# Patient Record
Sex: Female | Born: 1935
Health system: Southern US, Community
[De-identification: ages and names within clinical notes are randomized; demographics above are authoritative.]

## PROBLEM LIST (undated history)

## (undated) DIAGNOSIS — D099 Carcinoma in situ, unspecified: Secondary | ICD-10-CM

## (undated) DIAGNOSIS — I493 Ventricular premature depolarization: Secondary | ICD-10-CM

## (undated) DIAGNOSIS — C4442 Squamous cell carcinoma of skin of scalp and neck: Secondary | ICD-10-CM

## (undated) DIAGNOSIS — Z96653 Presence of artificial knee joint, bilateral: Secondary | ICD-10-CM

## (undated) DIAGNOSIS — I2510 Atherosclerotic heart disease of native coronary artery without angina pectoris: Secondary | ICD-10-CM

## (undated) DIAGNOSIS — K219 Gastro-esophageal reflux disease without esophagitis: Secondary | ICD-10-CM

## (undated) DIAGNOSIS — E6609 Other obesity due to excess calories: Secondary | ICD-10-CM

## (undated) DIAGNOSIS — I10 Essential (primary) hypertension: Secondary | ICD-10-CM

## (undated) DIAGNOSIS — Z9289 Personal history of other medical treatment: Secondary | ICD-10-CM

## (undated) DIAGNOSIS — E039 Hypothyroidism, unspecified: Secondary | ICD-10-CM

## (undated) DIAGNOSIS — C4491 Basal cell carcinoma of skin, unspecified: Secondary | ICD-10-CM

## (undated) DIAGNOSIS — C4492 Squamous cell carcinoma of skin, unspecified: Secondary | ICD-10-CM

## (undated) HISTORY — DX: Squamous cell carcinoma of skin of scalp and neck: C44.42

## (undated) HISTORY — DX: Gastro-esophageal reflux disease without esophagitis: K21.9

## (undated) HISTORY — DX: Other obesity due to excess calories: E66.09

## (undated) HISTORY — PX: TONSILLECTOMY: SUR1361

## (undated) HISTORY — DX: Atherosclerotic heart disease of native coronary artery without angina pectoris: I25.10

## (undated) HISTORY — DX: Personal history of other medical treatment: Z92.89

## (undated) HISTORY — PX: REPLACEMENT TOTAL KNEE: SUR1224

## (undated) HISTORY — DX: Presence of artificial knee joint, bilateral: Z96.653

## (undated) HISTORY — DX: Ventricular premature depolarization: I49.3

## (undated) HISTORY — DX: Hypothyroidism, unspecified: E03.9

---
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
## 1982-12-28 HISTORY — PX: ABDOMINAL HYSTERECTOMY: SHX81

## 1996-09-27 HISTORY — PX: CORONARY ARTERY BYPASS GRAFT: SHX141

## 1998-04-22 ENCOUNTER — Ambulatory Visit (HOSPITAL_COMMUNITY): Admission: RE | Admit: 1998-04-22 | Discharge: 1998-04-22 | Payer: Self-pay | Admitting: Internal Medicine

## 1998-05-30 ENCOUNTER — Emergency Department (HOSPITAL_COMMUNITY): Admission: EM | Admit: 1998-05-30 | Discharge: 1998-05-30 | Payer: Self-pay | Admitting: Emergency Medicine

## 1998-06-06 ENCOUNTER — Emergency Department (HOSPITAL_COMMUNITY): Admission: EM | Admit: 1998-06-06 | Discharge: 1998-06-06 | Payer: Self-pay | Admitting: Internal Medicine

## 1998-10-24 ENCOUNTER — Other Ambulatory Visit: Admission: RE | Admit: 1998-10-24 | Discharge: 1998-10-24 | Payer: Self-pay | Admitting: Internal Medicine

## 1999-05-05 ENCOUNTER — Encounter: Payer: Self-pay | Admitting: Internal Medicine

## 1999-05-05 ENCOUNTER — Ambulatory Visit (HOSPITAL_COMMUNITY): Admission: RE | Admit: 1999-05-05 | Discharge: 1999-05-05 | Payer: Self-pay | Admitting: Internal Medicine

## 1999-07-07 ENCOUNTER — Ambulatory Visit: Admission: RE | Admit: 1999-07-07 | Discharge: 1999-07-07 | Payer: Self-pay | Admitting: Internal Medicine

## 1999-07-14 ENCOUNTER — Encounter: Payer: Self-pay | Admitting: Ophthalmology

## 1999-07-14 ENCOUNTER — Ambulatory Visit (HOSPITAL_COMMUNITY): Admission: RE | Admit: 1999-07-14 | Discharge: 1999-07-15 | Payer: Self-pay | Admitting: Ophthalmology

## 1999-11-13 ENCOUNTER — Other Ambulatory Visit: Admission: RE | Admit: 1999-11-13 | Discharge: 1999-11-13 | Payer: Self-pay | Admitting: Internal Medicine

## 1999-11-18 DIAGNOSIS — D099 Carcinoma in situ, unspecified: Secondary | ICD-10-CM

## 1999-11-18 HISTORY — DX: Carcinoma in situ, unspecified: D09.9

## 2000-06-14 ENCOUNTER — Ambulatory Visit (HOSPITAL_COMMUNITY): Admission: RE | Admit: 2000-06-14 | Discharge: 2000-06-14 | Payer: Self-pay | Admitting: Internal Medicine

## 2000-06-14 ENCOUNTER — Encounter: Payer: Self-pay | Admitting: Internal Medicine

## 2000-12-10 ENCOUNTER — Other Ambulatory Visit: Admission: RE | Admit: 2000-12-10 | Discharge: 2000-12-10 | Payer: Self-pay | Admitting: Internal Medicine

## 2001-08-18 ENCOUNTER — Emergency Department (HOSPITAL_COMMUNITY): Admission: EM | Admit: 2001-08-18 | Discharge: 2001-08-18 | Payer: Self-pay | Admitting: Emergency Medicine

## 2001-08-18 ENCOUNTER — Encounter: Payer: Self-pay | Admitting: Emergency Medicine

## 2001-10-03 ENCOUNTER — Ambulatory Visit (HOSPITAL_COMMUNITY): Admission: RE | Admit: 2001-10-03 | Discharge: 2001-10-03 | Payer: Self-pay | Admitting: Internal Medicine

## 2001-10-03 ENCOUNTER — Encounter: Payer: Self-pay | Admitting: Internal Medicine

## 2002-04-08 ENCOUNTER — Emergency Department (HOSPITAL_COMMUNITY): Admission: EM | Admit: 2002-04-08 | Discharge: 2002-04-08 | Payer: Self-pay | Admitting: Emergency Medicine

## 2002-11-06 ENCOUNTER — Encounter: Payer: Self-pay | Admitting: Ophthalmology

## 2002-11-06 ENCOUNTER — Encounter (INDEPENDENT_AMBULATORY_CARE_PROVIDER_SITE_OTHER): Payer: Self-pay | Admitting: Specialist

## 2002-11-06 ENCOUNTER — Ambulatory Visit (HOSPITAL_COMMUNITY): Admission: RE | Admit: 2002-11-06 | Discharge: 2002-11-06 | Payer: Self-pay | Admitting: Ophthalmology

## 2002-12-12 ENCOUNTER — Emergency Department (HOSPITAL_COMMUNITY): Admission: EM | Admit: 2002-12-12 | Discharge: 2002-12-12 | Payer: Self-pay | Admitting: Emergency Medicine

## 2003-02-19 ENCOUNTER — Inpatient Hospital Stay (HOSPITAL_COMMUNITY): Admission: RE | Admit: 2003-02-19 | Discharge: 2003-02-22 | Payer: Self-pay | Admitting: Orthopedic Surgery

## 2003-02-22 ENCOUNTER — Inpatient Hospital Stay (HOSPITAL_COMMUNITY)
Admission: RE | Admit: 2003-02-22 | Discharge: 2003-02-28 | Payer: Self-pay | Admitting: Physical Medicine & Rehabilitation

## 2004-01-29 DIAGNOSIS — C4492 Squamous cell carcinoma of skin, unspecified: Secondary | ICD-10-CM

## 2004-01-29 HISTORY — DX: Squamous cell carcinoma of skin, unspecified: C44.92

## 2004-02-04 ENCOUNTER — Inpatient Hospital Stay (HOSPITAL_COMMUNITY): Admission: RE | Admit: 2004-02-04 | Discharge: 2004-02-07 | Payer: Self-pay | Admitting: Orthopedic Surgery

## 2004-02-07 ENCOUNTER — Inpatient Hospital Stay (HOSPITAL_COMMUNITY)
Admission: RE | Admit: 2004-02-07 | Discharge: 2004-02-13 | Payer: Self-pay | Admitting: Physical Medicine & Rehabilitation

## 2004-02-29 ENCOUNTER — Emergency Department (HOSPITAL_COMMUNITY): Admission: EM | Admit: 2004-02-29 | Discharge: 2004-02-29 | Payer: Self-pay

## 2004-07-14 DIAGNOSIS — C4491 Basal cell carcinoma of skin, unspecified: Secondary | ICD-10-CM

## 2004-07-14 HISTORY — DX: Basal cell carcinoma of skin, unspecified: C44.91

## 2004-07-18 ENCOUNTER — Ambulatory Visit (HOSPITAL_COMMUNITY): Admission: RE | Admit: 2004-07-18 | Discharge: 2004-07-18 | Payer: Self-pay | Admitting: Internal Medicine

## 2005-03-13 DIAGNOSIS — C4492 Squamous cell carcinoma of skin, unspecified: Secondary | ICD-10-CM

## 2005-03-13 HISTORY — DX: Squamous cell carcinoma of skin, unspecified: C44.92

## 2005-05-03 ENCOUNTER — Emergency Department (HOSPITAL_COMMUNITY): Admission: EM | Admit: 2005-05-03 | Discharge: 2005-05-03 | Payer: Self-pay | Admitting: Emergency Medicine

## 2005-08-10 ENCOUNTER — Ambulatory Visit (HOSPITAL_COMMUNITY): Admission: RE | Admit: 2005-08-10 | Discharge: 2005-08-10 | Payer: Self-pay | Admitting: Internal Medicine

## 2006-10-18 ENCOUNTER — Ambulatory Visit (HOSPITAL_COMMUNITY): Admission: RE | Admit: 2006-10-18 | Discharge: 2006-10-18 | Payer: Self-pay | Admitting: Internal Medicine

## 2006-11-08 ENCOUNTER — Encounter: Admission: RE | Admit: 2006-11-08 | Discharge: 2006-11-08 | Payer: Self-pay | Admitting: Orthopedic Surgery

## 2007-12-06 ENCOUNTER — Ambulatory Visit (HOSPITAL_COMMUNITY): Admission: RE | Admit: 2007-12-06 | Discharge: 2007-12-06 | Payer: Self-pay | Admitting: Internal Medicine

## 2009-01-08 ENCOUNTER — Ambulatory Visit (HOSPITAL_COMMUNITY): Admission: RE | Admit: 2009-01-08 | Discharge: 2009-01-08 | Payer: Self-pay | Admitting: Internal Medicine

## 2009-01-09 DIAGNOSIS — C4491 Basal cell carcinoma of skin, unspecified: Secondary | ICD-10-CM

## 2009-01-09 HISTORY — DX: Basal cell carcinoma of skin, unspecified: C44.91

## 2009-04-13 ENCOUNTER — Emergency Department (HOSPITAL_COMMUNITY): Admission: EM | Admit: 2009-04-13 | Discharge: 2009-04-13 | Payer: Self-pay | Admitting: Emergency Medicine

## 2010-04-03 ENCOUNTER — Ambulatory Visit (HOSPITAL_COMMUNITY): Admission: RE | Admit: 2010-04-03 | Discharge: 2010-04-03 | Payer: Self-pay | Admitting: Internal Medicine

## 2011-01-28 ENCOUNTER — Other Ambulatory Visit: Payer: Self-pay | Admitting: Dermatology

## 2011-03-30 ENCOUNTER — Other Ambulatory Visit (HOSPITAL_COMMUNITY): Payer: Self-pay | Admitting: Internal Medicine

## 2011-03-30 DIAGNOSIS — Z1231 Encounter for screening mammogram for malignant neoplasm of breast: Secondary | ICD-10-CM

## 2011-04-08 LAB — CBC
HCT: 36.4 % (ref 36.0–46.0)
MCHC: 33.8 g/dL (ref 30.0–36.0)
MCV: 95.4 fL (ref 78.0–100.0)
RBC: 3.81 MIL/uL — ABNORMAL LOW (ref 3.87–5.11)
WBC: 7 10*3/uL (ref 4.0–10.5)

## 2011-04-08 LAB — DIFFERENTIAL
Eosinophils Absolute: 0.1 10*3/uL (ref 0.0–0.7)
Eosinophils Relative: 1 % (ref 0–5)
Lymphs Abs: 1.7 10*3/uL (ref 0.7–4.0)
Monocytes Relative: 10 % (ref 3–12)
Neutro Abs: 4.5 10*3/uL (ref 1.7–7.7)

## 2011-04-08 LAB — BASIC METABOLIC PANEL
Chloride: 106 mEq/L (ref 96–112)
GFR calc Af Amer: >60 mL/min (ref >60)
Glucose, Bld: 145 mg/dL — ABNORMAL HIGH (ref 70–99)
Potassium: 3.7 mEq/L (ref 3.5–5.1)

## 2011-04-08 LAB — CK TOTAL AND CKMB (NOT AT ARMC): Total CK: 42 U/L (ref 7–177)

## 2011-04-23 ENCOUNTER — Ambulatory Visit (HOSPITAL_COMMUNITY)
Admission: RE | Admit: 2011-04-23 | Discharge: 2011-04-23 | Disposition: A | Discharge status: Home / Self Care | Payer: Medicare Other | Source: Ambulatory Visit | Attending: Internal Medicine | Admitting: Internal Medicine

## 2011-04-23 DIAGNOSIS — Z1231 Encounter for screening mammogram for malignant neoplasm of breast: Secondary | ICD-10-CM | POA: Insufficient documentation

## 2011-05-15 NOTE — Discharge Summary (Signed)
NAME:  Kathryn Carlson, Kathryn Carlson                       ACCOUNT NO.:  [id number]   MEDICAL RECORD NO.:  [id number]                   PATIENT TYPE:  IPS   LOCATION:  4140                                 FACILITY:  [facility]   PHYSICIAN:  Ranelle Oyster, M.D.             DATE OF BIRTH:  [birth year]   DATE OF ADMISSION:  02/07/2004  DATE OF DISCHARGE:  02/13/2004                                 DISCHARGE SUMMARY   DISCHARGE DIAGNOSES:  1. Left total knee replacement.  2. Hypothyroidism.  3. Hypertension.   HISTORY OF PRESENT ILLNESS:  Kathryn Carlson is a 77 year old female, past  history of GERD, hypothyroidism, left knee osteoarthritis with end-stage  changes and pain despite conservative therapies.  She elected to undergo  left total knee replacement February 04, 2004, by Dr. Lequita Halt.  Postoperatively, is weightbearing as tolerated, on Coumadin for DVT  prophylaxis.  INR subtherapeutic at admission.  Physical therapy initiated  and patient currently minimal assist for transfers, minimal assist for  ambulating 20 feet with a rolling walker.   PAST MEDICAL HISTORY:  1. Hypertension.  2. Coronary artery disease, status post CABG.  3. Skin cancers.  4. Hysterectomy.  5. Right total knee replacement.  6. Appendectomy.  7. Retinal surgery x2 with lens reattachment.  8. Incontinence occasionally with nocturia.  9. Ocular migraines.  10.      ____________.  11.      Dyslipidemia.  12.      History of liver inflammation in the past.   ALLERGIES:  PROCAINE.   SOCIAL HISTORY:  The patient lives alone in one level home with five steps  at entry.  Was independent prior to admission.  Uses alcohol occasionally.  Does not use any tobacco.  Has a sister who can assist past discharge.   HOSPITAL COURSE:  Kathryn Carlson was admitted to rehab on February 02, 2004,  for inpatient therapies to consist of PT and OT daily.  Past admission, she  was maintained on Coumadin for DVT prophylaxis, subcu Lovenox had  been  initially planned on a therapeutic basis.  Blood pressures were monitored on  b.i.d. basis and have shown good control.   Labs done past admission showed hemoglobin 11.1, hematocrit 33.2, white  count 11.7, platelets 319.  Lytes showed sodium 138, potassium 4.0, chloride  103, CO2 28, BUN 12, creatinine 1.0, glucose 135.  UA/UCS was done and shows  no growth.  The patient's knee incision is noted to be healing well without  any signs or symptoms of infection, no drainage, no erythema noted.  During  her stay in rehab, Kathryn Carlson made good progress.  She is currently at  modified independent level for transfers, modified independent level for  ambulating 150 feet with rolling walker, required supervision for navigating  two stairs.  Knee flexion is approximately 85 degrees.  The patient is  modified independent for ADLs including toileting.  She is modified  independent for simple meal preparation tasks.  A home health CPM was  ordered to help further range of motion of the knee.  She will also continue  to have home health PT and OT by [facility] with home  health R.N. arranged to draw pro times next on February 15, 2004.  On  February 13, 2004, the patient is discharged to home in improved condition.   DISCHARGE MEDICATIONS:  1. Coumadin 4 mg p.o. per day.  2. Senokot S two p.o. q.h.s.  3. Lopressor 25 mg b.i.d.  4. Synthroid 50 mcg per day.  5. Lipitor 40 mg per day.  6. Aciphex 20 mg a day.  7. Oxycodone INR 5 to 10 mg p.o. q.4-6h. p.r.n. pain.  8. Robaxin 500 mg p.o. q.i.d. p.r.n. pain.   ACTIVITY:  Use walker.   DIET:  Regular.   WOUND CARE:  Keep area clean and dry.   SPECIAL INSTRUCTIONS:  No alcohol, no smoking, no driving.  [facility] with provide PT, OT and RN with next pro time on February 15, 2004.   FOLLOWUP:  The patient to follow up with Dr. Lequita Halt in the next week.  Follow-up with Dr. Jacky Kindle for routine check.  Follow up  with Dr. Riley Kill as  needed.      Greg Cutter, P.A.                    Ranelle Oyster, M.D.    PP/MEDQ  D:  02/13/2004  T:  02/14/2004  Job:  [id number]   cc:   Geoffry Paradise, M.D.  [address]  [place]  [place] [address]  Fax: [phone]   Ollen Gross, M.D.  [facility]  [address]  [address]  [place]  [place] [address]  Fax: [phone]

## 2011-05-15 NOTE — Op Note (Signed)
NAME:  Kathryn Carlson, Kathryn Carlson                       ACCOUNT NO.:  [id number]   MEDICAL RECORD NO.:  [id number]                   PATIENT TYPE:  INP   LOCATION:  J191                                 FACILITY:  [facility]   PHYSICIAN:  Ollen Gross, M.D.                 DATE OF BIRTH:  Apr 20, 1927   DATE OF PROCEDURE:  02/04/2004  DATE OF DISCHARGE:                                 OPERATIVE REPORT   PREOPERATIVE DIAGNOSIS:  Osteoarthritis of the left knee.   POSTOPERATIVE DIAGNOSES:  Osteoarthritis of the left knee.   OPERATION/PROCEDURE:  Left total knee arthroplasty.   SURGEON:  Ollen Gross, M.D.   ASSISTANT:  Clarene Reamer, P.A.-C.   ANESTHESIA:  Spinal.   ESTIMATED BLOOD LOSS:  Minimal.   DRAINS:  Hemovac x1.   COMPLICATIONS:  None.   TOURNIQUET TIME:  40 minutes at 300 mmHg.   DISPOSITION:  Stable to recovery.   BRIEF CLINICAL NOTE:  Kathryn Carlson is a 77 year old female with end-stage  osteoarthritis of the left knee with pain refractory to nonoperative  management including injections.  She had a previous very successful right  total knee arthroplasty and presents now for left total knee arthroplasty.   DESCRIPTION OF PROCEDURE:  After the successful administration of spinal  anesthesia, tourniquet was placed on the left thigh and left lower extremity  prepped and draped in the usual sterile fashion. The extremity was wrapped  in Esmarch, knee flexed, tourniquet inflated to 300 mmHg.   Midline incision was made with a 10-blade through the subcutaneous tissue to  the level of the extensor mechanism.  A fresh blade was used to make a  medial parapatellar arthrotomy and the soft tissue of proximal medial tibia  subperiosteally elevated to the joint line with the knife into the  semimembranosus bursa with a Cobb elevator.  Soft tissue over the proximal  lateral tibia was also elevated with attention being paid to avoid the  patellar tendon on the tibial tubercle.  Patella  was then everted and knee  flexed 90 degrees and the ACL and PCL removed.  Drill was used to create a  starting hole at the distal femur.  The canal was irrigated.  A five-degree  left valgus alignment guide was placed.  Referencing with the posterior  condyles, locations were marked and the block pins were moved 10 mm off the  distal femur.  Distal femoral resection was made with an oscillating saw.  Sizing block was then placed and size 3  was the most appropriate.  Rotation  was marked off the epicondylar axis.  The size 3 cutting block was placed  and the anterior and posterior cuts made.   Tibia was subluxed forward and the menisci removed.  Extra medullary tibial  alignment guide placed surfacing proximally to the medial aspect of the  tibial tubercle and distally along the second metatarsal axis and tibial  crest.  Block  and its pins are removed 10 mm over the nondeficient lateral  side.  Tibial resection was made with an oscillating saw.  A size 3 was most  appropriate tibia component and the  proximal tibia was prepared with a  modular drill and keel punch.  Femoral preparation was then completed with  the intercondylar and chamfer cuts.   Size 3 mobile bearing tibial trial with the size 3 posterior stabilized  femoral trial and a 10 mm posterior stabilized rotating platform insert  trial are placed.  Full extension was achieved with excellent varus and  valgus balance throughout the full range of motion.  The patella was then  everted.  Thickness was measured to be 24 mm free-hand resection was taken  to 15 mm, a 38 10-blade was placed.  Lug holes were drilled, trial patella  was placed and it tracks normally.  Osteophytes were removed off the  posterior femur with the trials in place.  All trials were then removed and  the cut bone surfaces were then prepared with pulsatile lavage.  Cement was  mixed and once ready for implantation, the size 3 mobile bearing tibial  tray, size  3 posterior stabilized femur and 38 patella are cemented into  place and patella held with a clamp.  The 10 mm trial insert was placed and  the knee held in full extension and all extruded cement removed.  Once the  cement had fully hardened, then a permanent size 10 mm posterior stabilized  rotating platform insert was placed into the tibial tray.  The wounds were  then copiously irrigated with antibiotic solution and the extensor mechanism  closed over a Hemovac drain with interrupted #1 PDS.  The tourniquet  released after a total time of 40 minutes.  Flexion against gravity is 140  degrees.  Subcutaneous tissues were then closed with interrupted 2-0 Vicryl,  subcuticular running 4-0 Monocryl.  Incision was cleaned and dried and Steri-  Strips and a bulky sterile dressing applied.  She was subsequently awakened  and transported to recovery in stable condition.                                               Ollen Gross, M.D.    FA/MEDQ  D:  02/04/2004  T:  02/04/2004  Job:  [id number]

## 2011-05-15 NOTE — Discharge Summary (Signed)
NAME:  Kathryn Carlson, Kathryn Carlson                       ACCOUNT NO.:  [id number]   MEDICAL RECORD NO.:  [id number]                   PATIENT TYPE:  IPS   LOCATION:  4142                                 FACILITY:  [facility]   PHYSICIAN:  Ranelle Oyster, M.D.             DATE OF BIRTH:  [birth year]   DATE OF ADMISSION:  02/22/2003  DATE OF DISCHARGE:  02/28/2003                                 DISCHARGE SUMMARY   DISCHARGE DIAGNOSES:  1. Right total knee arthroplasty secondary to osteoarthritis.  2. History of thyroid disease.  3. History of elevated cholesterol.  4. Anemia.   HISTORY OF PRESENT ILLNESS:  The patient is a 77 year old white female  admitted on February 19, 2003 with history of bilateral knee pain, right  greater than left and _________ therapy.  The patient underwent a right  total knee arthroplasty secondary to DJD on February 19, 2003 by Dr. Antony Odea.  The patient is on Coumadin for DVT prophylaxis.  PT report at this time  indicates that patient is ambulating minimal assist 10 feet with a rolling  walker, transfer with mild assist.   HOSPITAL COURSE:  Admitted for anemia.  The patient was transferred to [facility] patient rehab department on February 22, 2003.   PAST MEDICAL HISTORY:  Significant for hypertension, CVD, OS, cardiovascular  disease, thyroid disease and hyperlipidemia.   PAST SURGICAL HISTORY:  Significant for appendectomy, hysterectomy, CABG.   PRIMARY CARE PHYSICIAN:  Dr. Jacky Kindle.   CARDIOLOGIST:  [name].   SOCIAL HISTORY:  The patient is a widow, lives in a one level home.  She is  retired, denies any tobacco or alcohol use.   ALLERGIES:  NOVOCAIN.   FAMILY HISTORY:  Noncontributory.   REVIEW OF SYSTEMS:  Significant for joint pain, chest pain, no shortness of  breath.   HOSPITAL COURSE:  Kathryn Carlson was admitted to [facility] rehab department  on February 22, 2003 for comprehensive patient rehabilitation where she  received more than  three hours of therapy daily.  Overall Kathryn Carlson made  great progress while in rehab.  She was discharged in modified independent  level, ambulating approximately 100 feet with rolling walker.  Hospital  course while in rehab significant for anemia and mild hypokalemia and mild  dizziness.  The patient remained on __________ p.o. b.i.d. ___________.  In  rehab she had admission hemoglobin of 9.1.  Latest hemoglobin was 9.8.  She  remained on Coumadin for DVT prophylaxis, without any DVT complications.  It  was noted on rehab day number two the patient experienced some dizziness  once elevated.  She did have possible orthostasis but this has resolved  within the next day.  The patient did have a mild hypokalemia of 3.4  No  supplements were added at this time.  The patient was not on any diuretics.  Pain was being controlled with oxycodone.  The patient remained on Lopressor  for hypertension.  Blood pressure remained in good control while in rehab.  No adjustments in medication were necessary.   Latest laboratory indicated a hemoglobin of 9.8, hematocrit 28.6.  Latest  INR was 2.7.  White blood cells 8.2, platelet count 239, sodium 138,  chloride 106, CO2 27, glucose 120, BUN 12, creatinine 0.9, potassium 3.4.  She had a urine culture performed on February 22, 2003.  It did show 30,000  colonies, multi species present without uropathogen isolated.  Incision was  healed very well.  Dr. Antony Odea followed patient throughout her stay in rehab.  The patient had approximately seven degrees flexion in her right knee.  Recommend PT at the time of discharge.  PT report indicated that patient was  ambulating modified independently greater than 100 feet with rolling walker.  Transfer sit to stand modified independently and bed mobility modified  independently.  Patient can perform all ADL's modified independently.  She  was discharged home with her family.   DISCHARGE MEDICATIONS:  Include:  1.  Lopressor 50 mg one half tablet twice daily.  2. Synthroid 50 mcg daily.  3. Lipitor 40 mg daily.  4. Elavil 12.5 mg daily.  5. Trinsicon one tablet twice daily.  6. Coumadin 4 mg taken until March 20, 2003.  7. ___________ vitamin C, vitamin E, oxycodone 5 mg every four to six hours     as needed for pain.  8. Pain management oxycodone, Tylenol.   ACTIVITY:  No driving, no drinking alcohol, no aspirin, ibuprofen or Aleve  while on Coumadin.  Patient is to use CP imagery __________ rolling walker.  She will have [place] home health care for PT and OT and RN.  First draw  will be Friday, March 02, 2003.    FOLLOW UP:  She is to follow up with Dr. Antony Odea within one week, call for  appointment.  Follow up with Dr. Jacky Kindle within six weeks for him to monitor  anemia.  Follow with Dr. Faith Rogue as needed.      Junie Bame, P.A.                       Ranelle Oyster, M.D.    LH/MEDQ  D:  02/28/2003  T:  02/28/2003  Job:  [id number]   cc:   Vanita Ingles, M.D.   Geoffry Paradise, M.D.  [address]  [place]  [place] [address]  Fax: [phone]   Richard A. Alanda Amass, M.D.  [phone] [address]  [place]  [place] [address]  Fax: [phone]

## 2011-05-15 NOTE — H&P (Signed)
Kathryn Carlson, Kathryn Carlson                         ACCOUNT NO.:  [id number]   MEDICAL RECORD NO.:  [id number]                   PATIENT TYPE:  LINP   LOCATION:                                       FACILITY:  [facility]   PHYSICIAN:  Ollen Gross, M.D.                 DATE OF BIRTH:  Jun 08, 1927   DATE OF ADMISSION:  02/04/2004  DATE OF DISCHARGE:                                HISTORY & PHYSICAL   CHIEF COMPLAINT:  Left knee pain.   HISTORY OF PRESENT ILLNESS:  The patient is a 77 year old female who is well  known to Dr. Ollen Gross, having previously undergone a right total knee  arthroplasty, and has done quite well.  She unfortunately has had continued  left knee pain.  She has known end-stage arthritis in the left knee.  She  has been treated conservatively in the past, and has been refractory to non-  operative management.  She has also undergone injections, which have only  provided temporary relief.  She has reached a point where she would like to  have something done about it.  It was felt she would benefit from undergoing  total knee replacement.  Risks and benefits discussed.  The patient was  subsequently admitted to the hospital.  She has been seen by Dr. Geoffry Paradise preoperative, and it is felt that she is stable to undergo surgery.   ALLERGIES:  NOVOCAIN.   CURRENT MEDICATIONS:  1. Lopressor.  2. Lipitor.  3. Aciphex.  4. Synthroid.  5. Aspirin stopped prior to surgery.  6. Maxzide as needed.  7. Estradiol.   PAST MEDICAL HISTORY:  1. Ocular migraines.  2. Reflux disease.  3. Mild urinary incontinence.  4. Hypertension.  5. Coronary arterial disease.  6. History of myocardial infarction in 1968.  7. Hiatal hernia.  8. Hemorrhoids.  9. Thyroid disease.  10.      Osteoporosis.   PAST SURGICAL HISTORY:  1. Appendectomy.  2. Hysterectomy.  3. Bypass surgery.  4. Arthroscopic knee surgery.  5. Right total knee replacement.   SOCIAL HISTORY:  Widowed.   Retired.  Nonsmoker.  Occasional glass of wine.  She has 4 children.  Her sister will be assisting with her care after  surgery.   FAMILY HISTORY:  Father deceased at age 57 with heart disease.  Sister, age  52, with history of diabetes.   REVIEW OF SYSTEMS:  GENERAL:  No fevers, chills, night sweats.  NEUROLOGIC:  She does get ocular migraines.  She had an episode of double vision back in  July.  She did have a brain MRI which showed no abnormalities, no signs of  stroke.  RESPIRATORY:  No shortness of breath, productive cough, or  hemoptysis.  CARDIOVASCULAR:  Significant for a history of an myocardial  infarction in the past, coronary arterial disease, and hypertension.  She  denies any chest pain, angina,  orthopnea, or palpitations.  GI:  History of  constipation.  No nausea or vomiting.  No recent blood or mucous in the  stool.  GU:  Some slight mild urinary incontinence.  No dysuria, hematuria,  or discharge.  MUSCULOSKELETAL:  Pertinent to the knee found in the history  of present illness.   PHYSICAL EXAMINATION:  VITAL SIGNS:  Pulse 68, respirations 14, blood  pressure 162/84.  GENERAL:  A 77 year old white female, well-nourished, well-developed, in no  acute distress.  Alert, oriented, and cooperative.  Pleasant at the time of  exam.  HEENT:  Normocephalic and atraumatic.  Pupils round and reactive.  Extraocular movements intact.  NECK:  Supple.  A faint trace left-sided bruit versus a referred murmur from  the chest.  CHEST:  Clear to auscultation.  No rhonchi or rales.  HEART:  Regular rate and rhythm with a 2/6 to 3/6 systolic ejection murmur.  ABDOMEN:  Soft, slightly round, nontender.  Bowel sounds are present.  RECTAL/BREAST/GENITALIA:  Not done.  Not pertinent to the present illness.  EXTREMITIES:  Significant to the left knee.  She has marked crepitus on  passive range of motion.  Range of motion is 5-115 degrees.  Motor function  is intact.  Sensation is intact.   No swelling.   IMPRESSION:  1. Osteoarthritis, left knee.  2. Ocular migraines.  3. Hypertension.  4. Coronary arterial disease.  5. History of myocardial infarction.  6. Hiatal hernia.  7. Reflux disease.  8. Hemorrhoids.  9. Mild urinary incontinence.  10.      Thyroid disease.  11.      Macular degeneration.  12.      Osteoporosis.  13.      History of elevated triglycerides.   PLAN:  The patient will be admitted to [facility] to undergo a  left total knee replacement arthroplasty.  Surgery will be performed by Dr.  Ollen Gross.  Her medical doctor is Dr. Jacky Kindle.  Dr. Jacky Kindle will be  notified of the room number on admission, and will be consulted if needed  for any medical assistance with the patient throughout the hospital course.     Alexzandrew L. Julien Girt, P.A.              Ollen Gross, M.D.    ALP/MEDQ  D:  02/20/2004  T:  02/20/2004  Job:  [id number]   cc:   Geoffry Paradise, M.D.  [address]  [place]  [place] [address]  Fax: [phone]

## 2011-05-15 NOTE — Op Note (Signed)
NAME:  SENAIDA, CHILCOTE                       ACCOUNT NO.:  [id number]   MEDICAL RECORD NO.:  [id number]                   PATIENT TYPE:  OIB   LOCATION:  2899                                 FACILITY:  [facility]   PHYSICIAN:  Alford Highland. Rankin, M.D.                DATE OF BIRTH:  1927/03/06   DATE OF PROCEDURE:  11/06/2002  DATE OF DISCHARGE:                                 OPERATIVE REPORT   PREOPERATIVE DIAGNOSIS:  Cloudy intraocular lens, right eye, silicone with  manufacturer's defect, by [employer].   POSTOPERATIVE DIAGNOSIS:  Cloudy intraocular lens, right eye, silicone with  manufacturer's defect, by [employer].   OPERATION PERFORMED:  1. Intraocular lens exchange, right eye.  2. Anterior vitrectomy, right eye.   SPECIMENS:  Cloudy intraocular lens for gross evaluation and examination.  Model of the lens style is a Storz, EZE-60, power +19.0, length 12.75,  serial number [id number].   SURGEON:  Alford Highland. Rankin, M.D.   ANESTHESIA:  Retrobulbar anesthesia control.   INDICATIONS FOR PROCEDURE:  The patient is a 77 year old woman who has had  profound vision loss in the right eye after initially successful intraocular  lens placement, cataract extraction some years before, who was found to have  cloudy deposits within the substance of the intraocular lens effectively  reclouding her visual acuity as well as hampering her night vision.  The  patient understands this is an attempt to remove the manufacturer's defect  intraocular lens in the right eye and to perform intraocular lens.  She  understands that it is unlikely we will be able to place a lens into the bag  unless a larger one piece all-PMMA lens may in fact be required.  The  patient understands the risks of anesthesia including the rare occurrence of  death but also to the eye including hemorrhage, infection, scarring, need  for surgery, no change in vision, loss of vision and progression of disease  despite intervention.  After  appropriate signed consent was obtained, she  was taken to the operating room.   DESCRIPTION OF PROCEDURE:  In the operating room appropriate monitoring was  followed by mild sedation and 0.75% Marcaine with  35 cc retrobulbar followed by an additional 5 cc ________ fashion of  modified Darel Hong.  Right periocular region was sterilely prepped and draped  in the usual ophthalmic fashion.  Lid speculum applied.  Conjunctival  peritomy was fashioned in limited fashion superiorly.  A grooved limbal  incision was fashioned with a crescent blade.  This was followed by entry  into the anterior chamber with an MVR blade and allowed deepening with  Provisc.  A separate paracentesis incision was then made at the 9 o'clock  position through which attempt was made to use a Sinskey hook to rotate the  intraocular lens.  A tight phimotic anterior capsule rim did not allow this.  Small capsule openings in the radial fashion  were then made with intraocular  scissors to allow for enhanced mobility and mobilization of the intraocular  lens in the bag.  Notable findings were the posterior capsule was open.  At  this time the intraocular lens was then rotated successfully with a Sinskey  hook into the iris plane.  Thereafter the anterior chamber limbal wound was  opened with MVR blade and intraocular lens grasped with DORC forceps and  exteriorized and sent for pathologic examination.  The anterior chamber in  the field had been deepened and protected with Viscoat prior to its removal.  At this time a Storz lens was then placed into the sulcus and then rotated  to the horizontal position.  10-0 nylon was then used and knots buried to  close the 60 mm limbal wound superiorly.  At this time a Lewicky anterior  chamber maintainer was fixed through the cornea with constant infusion and  the anterior vitrectomy was then carried out through the limbal wound  through the visual axis to assure all removal of debris.   The remainder of  the Viscoat was aspirated.  Limbal wound was then checked and found to be  secure.  At this time the wound was checked and found to be secure.  Conjunctiva closed with 7-0 Vicryl suture.  Subconjunctival injection of  antibiotic and steroid applied.  The patient tolerated the procedure well  without complication.                                                 Alford Highland Rankin, M.D.    GAR/MEDQ  D:  11/06/2002  T:  11/06/2002  Job:  [id number]   cc:   Richarda Overlie, M.D.

## 2011-05-15 NOTE — Op Note (Signed)
NAME:  Kathryn Carlson, Kathryn Carlson                       ACCOUNT NO.:  [id number]   MEDICAL RECORD NO.:  [id number]                   PATIENT TYPE:  INP   LOCATION:  NA                                   FACILITY:  [facility]   PHYSICIAN:  Ollen Gross, M.D.                 DATE OF BIRTH:  1927/10/29   DATE OF PROCEDURE:  02/19/2003  DATE OF DISCHARGE:                                 OPERATIVE REPORT   PREOPERATIVE DIAGNOSIS:  Osteoarthritis, right knee.   POSTOPERATIVE DIAGNOSIS:  Osteoarthritis, right knee.   PROCEDURE:  Right total knee arthroplasty.   SURGEON:  Ollen Gross, M.D.   ASSISTANT:  Alexzandrew L. Julien Girt, P.A.   ANESTHESIA:  Spinal.   ESTIMATED BLOOD LOSS:  Minimal.   DRAINS:  Hemovac x1.   TOURNIQUET TIME:  45 minutes at 300 mmHg.   COMPLICATIONS:  None.   CONDITION:  Stable, to the recovery room.   INDICATIONS FOR PROCEDURE:  The patient is a 77 year old female with end-  stage arthritis of the right knee with bone on bone changes and pain,  refractive to nonoperative management.  She presents now for a right total  knee arthroplasty.   DESCRIPTION OF PROCEDURE:  After the successful administration of spinal  anesthetic, the tourniquet is placed high on the right thigh,  and the right  lower extremity is prepped and draped in the usual sterile fashion.  The  extremity is wrapped in an Esmarch and the knee flexed, and the tourniquet  was inflated to 300 mmHg.  A standard midline incision was made with a #10  blade through the subcutaneous tissue to the level of the extensor  mechanism.  A fresh blade is used to make a median parapatellar arthrotomy  down to soft tissue over the proximal medial tibia, and subperiosteally  elevated to the joint line with the knife into the semimembranosi bursa with  the curved osteotome.  The soft tissue at the proximal lateral tibia is  elevated, with attention being paid to avoid the patellar tendon on the  tibial tubercle.  The  patella is everted and efflexed 90 degrees.  The ACL  and PCL removed.  A drill was used to correct the ______ on the distal  femur, and then the canal was irrigated.  A 5-degree right valgus alignment  guide was placed, referencing off the posterior condyle, and rotation is  marked on the block, and to remove 10 mm off the distal femur.  The distal  femur resection was made with an oscillating saw.  The sizing block was  placed and sized.  Size #3 is the most appropriate.  The rotation ended up  corresponding with the epicondylar axis.  The AP cutting block is placed,  and the anterior posterior cuts made.  The tibia is subluxed forward and the menisci removed.  The extramedullary  tibia alignment guide is placed, referencing proximally off the medial  aspect of the tibial tubercle and distally along the second metatarsal axis  and tibial crest and lock is pinned to remove 10 mm off the lateral side.  The tibial resection is made with an oscillating saw.  Size #3 tibia is most  appropriate, and in the proximal tibia is prepared a modular drill and keel  punch.  The femoral preparation is then completed with the inner condylar  and chamfer cuts.  The size #3 posterior stabilized femoral component size is removed, the  bearing tibial trial, and the 10 mm posterior stabilizer obtained _______  and anterior trial placed.  Full extension is achieved with excellent varus  and valgus balance throughout a full range of motion.  The patella was then  everted.  The thickness was measured to be 23 mm.  Free hand resection  taking 13 mm.  A #35 template placed.  Local hole was drilled.  Trial  patella placed and it tracks normally.  Osteophytes are then removed off the  posterior femur with the trials in place.  All the trials are removed and  the cut bone surfaces prepared with pulsatile lavage.  Cement is mixed and  _______ implantation sizer removed by tibial tray size, the posterior  stabilizer  femoral component, and #35 patella cemented into place, and the  trial was held with the clamp.  A 12 mm trial insert was placed with the  knee held in full extension, and all screwed cement removed.  When the  cement fully hardened, then the permanent 10 mm posterior stabilizer with  routine platform inserts placed as a size #3 tibial tray.  The knee is  reduced, with excellent stability throughout.  The wound was copiously  irrigated with antibiotic solution and the extensor mechanism is closed over  a Hemovac drain with interrupted #1 PDS.  The tourniquet is released with a  total time of 45 minutes.  Flexion against gravity is 130 degrees.  The  subcutaneous tissues are closed with interrupted #2-0 Vicryl, and the  subcuticular running #4-0 Monocryl.  The incision is clean and dry, and  Steri-Strips and a bulky sterile dressing is applied.  The patient is then awakened and transported to the recovery room in stable  condition.                                                 Ollen Gross, M.D.    FA/MEDQ  D:  02/19/2003  T:  02/19/2003  Job:  [id number]

## 2011-05-15 NOTE — Discharge Summary (Signed)
NAME:  Kathryn Carlson, Kathryn Carlson                       ACCOUNT NO.:  [id number]   MEDICAL RECORD NO.:  [id number]                   PATIENT TYPE:  INP   LOCATION:  0465                                 FACILITY:  [facility]   PHYSICIAN:  Ollen Gross, M.D.                 DATE OF BIRTH:  01/26/1927   DATE OF ADMISSION:  02/04/2004  DATE OF DISCHARGE:  02/07/2004                                 DISCHARGE SUMMARY   ADMISSION DIAGNOSES:  1. Osteoarthritis, left knee.  2. Ocular migraines.  3. Hypertension.  4. Coronary arterial disease.  5. History of myocardial infarction.  6. Hiatal hernia.  7. Reflux disease.  8. Hemorrhoids.  9. Mild urinary incontinence.  10.      Thyroid disease.  11.      Macular degeneration.  12.      Osteoporosis.  13.      History of elevated triglycerides.   DISCHARGE DIAGNOSES:  1. Osteoarthritis, left knee, status post left total knee replacement     arthroplasty.  2. Ocular migraines.  3. Hypertension.  4. Coronary arterial disease.  5. History of myocardial infarction.  6. Hiatal hernia.  7. Reflux disease.  8. Hemorrhoids.  9. Mild urinary incontinence.  10.      Thyroid disease.  11.      Macular degeneration.  12.      Osteoporosis.  13.      History of elevated triglycerides.   PROCEDURE:  The patient was taken to the OR on February 04, 2004, underwent a  left total knee arthroplasty.  Surgeon was Dr. Homero Fellers Aluisio.  Assistant was  [employer], P.A.-C.  Anesthesia was spinal.  Minimal blood loss.  Hemovac drain x1.  Tourniquet time 40 minutes at 300 mmHg.   CONSULTATIONS:  1. Rehab services, Dr. Riley Kill.  2. Medical services, Dr. Jacky Kindle.   HISTORY:  [name] well known to Dr. Lequita Halt, previously had  undergone a right total knee arthroplasty.  She has done well.  She reports  she has end-stage arthritis of the left knee that has been refractory to non-  operative management.  She presents now for a total knee replacement.   LABORATORY DATA:  CBC on admission showed a hemoglobin of 12.4, hematocrit  of 37.6, white blood cell count 6.8, red blood cell count 3.97, differential  within normal limits.  Postoperative H&H 11.2 and 33.5.  Last noted H&H 10.1  and 29.8.  PT/PTT preoperatively 12.3 and 30, respectively, with an INR of  0.9.  Serial prothrombin times were followed.  Last noted PT/INR 16.1 and  1.4.  Chem panel on admission showed a mildly elevated glucose of 124,  decreased albumin of 3.3, remaining chem panel within normal limits.  Serial  BMETs were followed.  Electrolytes remained within normal limits.  Glucose  went up from 124 to 158.  Urinalysis a little hazy preoperatively, otherwise  negative.  Blood  group type A positive.  Preoperative EKG dated January 31, 2004, normal sinus rhythm, nonspecific T-wave abnormalities, confirmed by  Dr. Lenise Herald.  Portable chest on January 31, 2004, increased markings  lung bases, may be chronic in origin, stability can be concluded as  described, no infiltrate or CHF.   HOSPITAL COURSE:  The patient was admitted to [facility] and taken  to the OR, underwent the above stated procedure without complication.  The  patient tolerated the procedure well, later transferred to the recovery room  and then to the recovery room for continued postoperative care.  Vital signs  were followed.  She was given 24 hours of postoperative IV antibiotics in  the form of Ancef, given three weeks of Coumadin.  Rehab consult called.  Placed weightbearing as tolerated.  Started back on her home medications.  Hemovac drain placed at time of surgery was pulled on postoperative day #1,  without difficulties.  She had good control with PCA and p.o. medications.  She was seen postoperatively by Dr. Jacky Kindle as a courtesy visit.  Medically,  she was stable.  By day #2, incision was healing well, dressing was changed,  no complaints.  Denied any chest pain or shortness of  breath.  She started  to progress with physical therapy.  She was seen by Dr. Riley Kill from a rehab  standpoint who felt she would be an appropriate candidate for inpatient  rehab.  Continued to receive her rehab.  Fortunately, a bed opened up on day  #3 on February 07, 2004, on the rehab unit.  The patient was slow to  progress in physical therapy and wean over to p.o. medications.  Doing well,  and it was decided the patient could be transferred over at this time.   DISCHARGE PLAN:  The patient is discharged over to [facility].   DISCHARGE DIAGNOSES:  Please see above.   DISCHARGE MEDICATIONS:  Continue medications as per [facility], and will be sent  over with the patient.   DIET:  Continue previously ordered diet.   ACTIVITY:  Weightbearing as tolerated.  Continue gait training, ambulation,  and ADLs as per PT and OT while on rehab unit.   DISPOSITION:  [facility].   CONDITION ON DISCHARGE:  Improved.     Alexzandrew L. Julien Girt, P.A.              Ollen Gross, M.D.    ALP/MEDQ  D:  03/11/2004  T:  03/13/2004  Job:  [id number]   cc:   Geoffry Paradise, M.D.  [address]  [place]  [place] [address]  Fax: [phone]

## 2011-05-15 NOTE — H&P (Signed)
NAME:  Kathryn Carlson, Kathryn Carlson                       ACCOUNT NO.:  [id number]   MEDICAL RECORD NO.:  [id number]                   PATIENT TYPE:  INP   LOCATION:  0452                                 FACILITY:  [facility]   PHYSICIAN:  Ollen Gross, M.D.                 DATE OF BIRTH:  09-02-1927   DATE OF ADMISSION:  02/19/2003  DATE OF DISCHARGE:                                HISTORY & PHYSICAL   CHIEF COMPLAINT:  Bilateral knee pain.   HISTORY OF PRESENT ILLNESS:  The patient is a 77 year old female who has  been seen by Ollen Gross, M.D. for ongoing bilateral knee pain.  She has  been seen.  Right knee appears to be more symptomatic, more problematic than  the left.  She has been treated conservatively in the past for her knee  pain.  However, the knee pain has been progressive despite conservative  measures.  She is seen in the office where x-rays do show bone on bone  contact in medial compartment on the right with close to bone on bone  contact medial compartment on the left.  Lateral views do show severe  spurring with bone on bone contact in the patellofemoral compartments of  both knees.  She had been treated in the past with injections with only  temporary symptomatic relief.  The pain is progressive and it is felt she  has reached a point where she would benefit undergoing knee replacement.  Risks and benefits discussed and she would like to proceed with right knee  surgery.   ALLERGIES:  No known drug allergies.  Intolerances:  NOVOCAINE causes heart  racing.  (The patient has tolerated lidocaine in the past without  difficulty.)   CURRENT MEDICATIONS:  1. Lopressor.  2. Lipitor.  3. Aciphex.  4. Synthroid.  5. Celebrex (stop prior to surgery).  6. Aspirin (stop prior to surgery).  7. Maxzide.  8. Estradiol.   The patient will bring her medications with the dosages to the hospital.   PAST MEDICAL HISTORY:  1. Elevated triglycerides.  2. Hypertension.  3. Coronary  vascular disease.  4. Myocardial infarction 1968.  5. Hiatal hernia.  6. Hemorrhoids.  7. Thyroid disease.  8. Osteoporosis.  9. Macular degeneration.   PAST SURGICAL HISTORY:  1. Appendectomy in 1941.  2. Hysterectomy in 1984.  3. Bypass surgery in 1997.  4. Retinal surgery x2 1999 and also in 2003 with reimplantation of the lens.  5. Arthroscopic surgeries in 2002.   SOCIAL HISTORY:  Widowed.  Retired.  Nonsmoker.  No alcohol.  Has four  children.  Has a one-story home with five steps entering into the household.   FAMILY HISTORY:  Father deceased age 37 with a history of heart disease.  Mother deceased age 58 with history of arthritis.  She has a sister age 16  with a history of diabetes and open heart surgery.   REVIEW  OF SYSTEMS:  GENERAL:  No fevers, chills, night sweats.  NEUROLOGIC:  She does have some macular degeneration and gets ocular pre migraine  headaches.  No seizures, syncope, paralysis.  RESPIRATORY:  She does have  some difficulty while she is lying flat and this is because of a hiatal  hernia.  However, there is no shortness of breath at rest or exertion.  No  hemoptysis.  CARDIOVASCULAR:  No chest pain, angina, orthopnea.  GASTROINTESTINAL:  No nausea, vomiting, or constipation.  GENITOURINARY:  No  dysuria, hematuria, or discharge.  She does have some occasional urgency and  nocturia.  MUSCULOSKELETAL:  Pertinent of both knees found in history of  present illness.   PHYSICAL EXAMINATION:  VITAL SIGNS:  Pulse 78, respirations 14, blood  pressure 130/82.  GENERAL:  The patient is a 77 year old white female well-nourished, well-  developed.  Appears to be in no acute distress.  She is alert, oriented,  cooperative.  HEENT:  Normocephalic, atraumatic.  Pupils round, reactive.  Oropharynx  clear.  NECK:  Supple.  No carotid bruits.  CHEST:  Clear to auscultation anterior/posterior chest walls.  No rhonchi,  rales, or wheezing.  HEART:  Regular rate and  rhythm with a grade 2-3/6 early systolic ejection  murmur noted, best heard at aortic, pulmonic, and Erb's point.  No rubs,  thrills, palpitations.  ABDOMEN:  Soft.  Slightly round abdomen.  Bowel sounds are present.  RECTAL:  Not done.  Not pertinent to present illness.  BREASTS:  Not done.  Not pertinent to present illness.  GENITALIA:  Not done.  Not pertinent to present illness.  EXTREMITIES:  The right lower extremity has a range of motion of 5-125  degrees.  Moderate crepitus noted.  No instability.  Left knee also shows  range of motion of 5-125 degrees with moderate crepitus.  No instability.  The right knee is more tender on palpation in the medial joint line as  compared to the left knee.   IMPRESSION:  1. Bilateral knees osteoarthritis.  2. Macular degeneration.  3. Elevated triglycerides.  4. Hypertension.  5. Coronary vessel disease.  6. History of myocardial infarction 1968.  7. Hiatal hernia.  8. Hemorrhoids.  9. Hypothyroidism.  10.      Osteoporosis.   PLAN:  The patient admitted to [facility].  Undergo a right total  knee arthroplasty.  The patient's medical physician is Geoffry Paradise, M.D.  Her cardiologist is Richard A. Alanda Amass, M.D.  Both physicians will be  notified of the room number on admission, be consulted if needed for any  medical assistance with this patient throughout the hospital course     Alexzandrew L. Julien Girt, P.A.              Ollen Gross, M.D.    ALP/MEDQ  D:  02/21/2003  T:  02/21/2003  Job:  [id number]   cc:   Ollen Gross, M.D.  [address]  [place]  [place] [address]  Fax: [phone]   Geoffry Paradise, M.D.  [address]  [place]  [place] [address]  Fax: [phone]   Richard A. Alanda Amass, M.D.  [phone]  [place]  [place] [address]  Fax: [phone]

## 2011-05-15 NOTE — Discharge Summary (Signed)
NAME:  Kathryn Carlson, Kathryn Carlson                       ACCOUNT NO.:  [id number]   MEDICAL RECORD NO.:  [id number]                   PATIENT TYPE:  INP   LOCATION:  0452                                 FACILITY:  [facility]   PHYSICIAN:  Ollen Gross, M.D.                 DATE OF BIRTH:  [birth year]   DATE OF ADMISSION:  02/19/2003  DATE OF DISCHARGE:  02/22/2003                                 DISCHARGE SUMMARY   ADMISSION DIAGNOSES:  1. Bilateral knees osteoarthritis.  2. Macular degeneration.  3. Elevated triglycerides.  4. Hypertension.  5. Coronary vessel disease.  6. History of myocardial infarction in 1968.  7. Hiatal hernia.  8. Hemorrhoids.  9. Hypothyroidism.  10.      Osteoporosis.   DISCHARGE DIAGNOSES:  1. Osteoarthritis right knee status post right total knee replacement     arthroplasty.  2. Osteoarthritis left knee.  3. Mild postoperative blood loss anemia.  4. Mild hyponatremia.  5. Macular degeneration.  6. Elevated triglycerides.  7. Hypertension.  8. Coronary vessel disease.  9. History of myocardial infarction in 1968.  10.      Hiatal hernia.  11.      Hemorrhoids.  12.      Hypothyroidism.  13.      Osteoporosis.   PROCEDURE:  The patient was taken to the OR on February 19, 2003 and  underwent a right total knee replacement arthroplasty. Surgeon:  Ollen Gross, M.D.  Assistant:  Alexzandrew L. Perkins, P.A.-C.  Surgery under  spinal anesthesia.  Minimal blood loss.  Hemovac drain x1.  Tourniquet time:  45 minutes at 300 mmHg.   CONSULTATIONS:  Rehabilitation services, Ellwood Dense, M.D.   BRIEF HISTORY:  The patient is a 77 year old female seen by Dr. Lequita Halt for  ongoing bilateral knee pain.  The right knee appears to be more symptomatic  and problematic than the left.  She has been treated conservatively in the  past.  X-ray in the office showed bone-on-bone changes in the medial  compartment on the right with close to bone-on-bone changes in the  medial  compartment on the left knee.  A lateral view shows spurring with bone-on-  bone contact in patellofemoral compartments of both knees.  She had been  treated conservatively in the past with injections which would only provide  her temporary relief.  The pain has been quite progressive to the point  where it is interfering with her daily activities.  It is felt that she  would benefit from undergoing knee replacement.  Risks and benefits  discussed.  The patient is admitted to surgery.   LABORATORY AND ACCESSORY DATA:  CBC on admission with hemoglobin at 13,  hematocrit 38.2, white cell count 6.7, red cell count 4.01, differential  within normal limits.  Postop H&H 11.2 and 32.9, last noted H&H 9.9 and 29.  PT/PTT on admission was 12.7 and 30 respectively.  Serial pro times  followed.  Last noted PT/INR 17.7 and 1.5.  Chem panel on admission all  within normal limits.  Serial BMETs were followed.  Glucose went up from 113  to 152 and back down to 137.  Calcium dropped from 10.3 to 8.1, last noted  8.  Sodium started out at 137 and went down to 135, was last noted at  minimally low level of 133.  Urinalysis on admission positive nitrites, only  small leukocyte esterase, only a few epithelial cells, 0-2 white cells, many  bacteria.  Blood group and type A positive.   Chest x-ray dated November 06, 2002 no active disease.  EKG dated November 06, 2002 normal sinus rhythm, nonspecific T-wave abnormalities confirmed by  Ricki Rodriguez, M.D.   HOSPITAL COURSE:  The patient was admitted to [facility] taken to  the OR and underwent the above-stated procedure without complication.  The  patient tolerated the procedure well, later sent to recovery, and then to  the orthopedic floor for postoperative care.  The patient was given 24 hours  of postop IV antibiotics, was placed on Coumadin for DVT prophylaxis, given  PC analgesics for pain control supplemented by p.o. meds.  Hemovac  drain  placed at the time of surgery was pulled on postop day #1.  The patient did  have some pain on the evening following surgery.  However, was improving  with meds.  By postop day #2 dressing was changed, incision was healing  well. PCA and Foley was discontinued along with the IV.  She was weaned over  to p.o. meds.  PT and OT consulted postop to assist with gait training  ambulation.  She was slow to progress, only ambulating approximately 2 feet  on postop day #2 and only 10 feet by postop day #3.  Due to the slow  progress, a rehab consult was called.  The patient was seen in consultation  by Ellwood Dense, M.D. who felt she would be an appropriate candidate for  inpatient rehab.  It was decided that the patient would be transferred at  which time a bed became available.  Incision was healing well by postop day  #3.  She was only ambulating approximately 10 feet.  However, her pain had  been improving with p.o. meds.  It was noted later that day that a bed did  become available in the rehab unit.  She was stable after surgery,  although  she was very slow to progress with therapy.  It was felt she would be an  excellent candidate and was transferred at that time.   DISCHARGE MEDICATIONS AND PLAN:  1. The patient transferred over to rehab on February 22, 2003.  2. Discharge diagnoses, please see above.  3. Discharge medications:     a. The patient to continue meds as in the hospital course as per the [facility]        will be sent over with a chart.     b. She is also on Percocet for pain and Robaxin for spasm, Coumadin for        DVT prophylaxis.  4. Diet:  Continue current diet of low sodium, low cholesterol/triglyceride     diet.  5. Activity:  Weightbearing as tolerated to right lower extremity.  Continue     with PT and OT for gait training, ambulation, and ADLs with total knee     protocol.  DISPOSITION:  The patient is going to rehab.  FOLLOW UP:  Two weeks from surgery  and following the discharge from the  rehab unit.   CONDITION ON DISCHARGE:  Improved.     Alexzandrew L. Julien Girt, P.A.              Ollen Gross, M.D.    ALP/MEDQ  D:  03/12/2003  T:  03/12/2003  Job:  [id number]   cc:   Ellwood Dense, M.D.  [address]  [place]  [place] [address]  Fax: [phone]

## 2011-07-29 ENCOUNTER — Emergency Department (HOSPITAL_COMMUNITY)
Admission: EM | Admit: 2011-07-29 | Discharge: 2011-07-29 | Disposition: A | Discharge status: Home / Self Care | Payer: Medicare Other | Attending: Emergency Medicine | Admitting: Emergency Medicine

## 2011-07-29 ENCOUNTER — Emergency Department (HOSPITAL_COMMUNITY): Payer: Medicare Other

## 2011-07-29 DIAGNOSIS — Z96659 Presence of unspecified artificial knee joint: Secondary | ICD-10-CM | POA: Insufficient documentation

## 2011-07-29 DIAGNOSIS — I252 Old myocardial infarction: Secondary | ICD-10-CM | POA: Insufficient documentation

## 2011-07-29 DIAGNOSIS — Z951 Presence of aortocoronary bypass graft: Secondary | ICD-10-CM | POA: Insufficient documentation

## 2011-07-29 DIAGNOSIS — I10 Essential (primary) hypertension: Secondary | ICD-10-CM | POA: Insufficient documentation

## 2011-07-29 DIAGNOSIS — R002 Palpitations: Secondary | ICD-10-CM | POA: Insufficient documentation

## 2011-07-29 DIAGNOSIS — K219 Gastro-esophageal reflux disease without esophagitis: Secondary | ICD-10-CM | POA: Insufficient documentation

## 2011-07-29 DIAGNOSIS — N390 Urinary tract infection, site not specified: Secondary | ICD-10-CM | POA: Insufficient documentation

## 2011-07-29 DIAGNOSIS — I2510 Atherosclerotic heart disease of native coronary artery without angina pectoris: Secondary | ICD-10-CM | POA: Insufficient documentation

## 2011-07-29 DIAGNOSIS — E039 Hypothyroidism, unspecified: Secondary | ICD-10-CM | POA: Insufficient documentation

## 2011-07-29 LAB — BASIC METABOLIC PANEL
BUN: 16 mg/dL (ref 6–23)
Calcium: 9.6 mg/dL (ref 8.4–10.5)
Chloride: 101 mEq/L (ref 96–112)
Creatinine, Ser: 0.72 mg/dL (ref 0.50–1.10)

## 2011-07-29 LAB — URINE MICROSCOPIC-ADD ON

## 2011-07-29 LAB — DIFFERENTIAL
Basophils Relative: 1 % (ref 0–1)
Lymphs Abs: 2.2 10*3/uL (ref 0.7–4.0)
Monocytes Relative: 8 % (ref 3–12)
Neutro Abs: 5 10*3/uL (ref 1.7–7.7)
Neutrophils Relative %: 63 % (ref 43–77)

## 2011-07-29 LAB — CBC
Hemoglobin: 10.9 g/dL — ABNORMAL LOW (ref 12.0–15.0)
MCV: 95.6 fL (ref 78.0–100.0)
RBC: 3.61 MIL/uL — ABNORMAL LOW (ref 3.87–5.11)
RDW: 13.4 % (ref 11.5–15.5)
WBC: 8 10*3/uL (ref 4.0–10.5)

## 2011-07-29 LAB — CK TOTAL AND CKMB (NOT AT ARMC): Relative Index: INVALID (ref 0.0–2.5)

## 2011-07-29 LAB — URINALYSIS, ROUTINE W REFLEX MICROSCOPIC
Hgb urine dipstick: NEGATIVE
Leukocytes, UA: NEGATIVE
Nitrite: POSITIVE — AB
Protein, ur: NEGATIVE mg/dL
Urobilinogen, UA: 0.2 mg/dL (ref 0.0–1.0)

## 2011-07-29 LAB — TROPONIN I: Troponin I: <0.3 ng/mL (ref <0.30)

## 2011-07-29 LAB — MAGNESIUM: Magnesium: 1.8 mg/dL (ref 1.5–2.5)

## 2011-07-31 LAB — URINE CULTURE: Culture  Setup Time: 201208012141

## 2011-08-10 ENCOUNTER — Ambulatory Visit
Admission: RE | Admit: 2011-08-10 | Discharge: 2011-08-10 | Disposition: A | Discharge status: Home / Self Care | Payer: Medicare Other | Source: Ambulatory Visit | Attending: Cardiovascular Disease | Admitting: Cardiovascular Disease

## 2011-08-10 ENCOUNTER — Other Ambulatory Visit: Payer: Self-pay | Admitting: Cardiovascular Disease

## 2011-08-10 DIAGNOSIS — R7989 Other specified abnormal findings of blood chemistry: Secondary | ICD-10-CM

## 2011-08-10 DIAGNOSIS — R0602 Shortness of breath: Secondary | ICD-10-CM

## 2011-08-10 DIAGNOSIS — R002 Palpitations: Secondary | ICD-10-CM

## 2011-08-10 MED ORDER — IOHEXOL 300 MG/ML  SOLN
100.0000 mL | Freq: Once | INTRAMUSCULAR | Status: AC | PRN
  Administered 2011-08-10: 100 mL via INTRAVENOUS

## 2011-09-20 ENCOUNTER — Emergency Department (HOSPITAL_COMMUNITY): Payer: Medicare Other

## 2011-09-20 ENCOUNTER — Emergency Department (HOSPITAL_COMMUNITY)
Admission: EM | Admit: 2011-09-20 | Discharge: 2011-09-20 | Disposition: A | Discharge status: Home / Self Care | Payer: Medicare Other | Attending: Emergency Medicine | Admitting: Emergency Medicine

## 2011-09-20 DIAGNOSIS — E039 Hypothyroidism, unspecified: Secondary | ICD-10-CM | POA: Insufficient documentation

## 2011-09-20 DIAGNOSIS — K219 Gastro-esophageal reflux disease without esophagitis: Secondary | ICD-10-CM | POA: Insufficient documentation

## 2011-09-20 DIAGNOSIS — I10 Essential (primary) hypertension: Secondary | ICD-10-CM | POA: Insufficient documentation

## 2011-09-20 DIAGNOSIS — IMO0002 Reserved for concepts with insufficient information to code with codable children: Secondary | ICD-10-CM | POA: Insufficient documentation

## 2011-09-20 DIAGNOSIS — I2510 Atherosclerotic heart disease of native coronary artery without angina pectoris: Secondary | ICD-10-CM | POA: Insufficient documentation

## 2011-09-20 DIAGNOSIS — I252 Old myocardial infarction: Secondary | ICD-10-CM | POA: Insufficient documentation

## 2011-09-20 DIAGNOSIS — K5900 Constipation, unspecified: Secondary | ICD-10-CM | POA: Insufficient documentation

## 2011-09-20 DIAGNOSIS — Z7982 Long term (current) use of aspirin: Secondary | ICD-10-CM | POA: Insufficient documentation

## 2011-09-20 DIAGNOSIS — Z79899 Other long term (current) drug therapy: Secondary | ICD-10-CM | POA: Insufficient documentation

## 2011-09-20 LAB — DIFFERENTIAL
Eosinophils Absolute: 0.2 10*3/uL (ref 0.0–0.7)
Eosinophils Relative: 3 % (ref 0–5)
Lymphs Abs: 2 10*3/uL (ref 0.7–4.0)
Monocytes Relative: 11 % (ref 3–12)

## 2011-09-20 LAB — CBC
HCT: 36 % (ref 36.0–46.0)
MCH: 31.5 pg (ref 26.0–34.0)
MCV: 93.8 fL (ref 78.0–100.0)
Platelets: 261 10*3/uL (ref 150–400)
RBC: 3.84 MIL/uL — ABNORMAL LOW (ref 3.87–5.11)
RDW: 13.4 % (ref 11.5–15.5)

## 2011-09-20 LAB — POCT I-STAT, CHEM 8
BUN: 15 mg/dL (ref 6–23)
Calcium, Ion: 1.18 mmol/L (ref 1.12–1.32)
Chloride: 100 mEq/L (ref 96–112)
Glucose, Bld: 126 mg/dL — ABNORMAL HIGH (ref 70–99)

## 2011-09-20 LAB — URINALYSIS, ROUTINE W REFLEX MICROSCOPIC
Bilirubin Urine: NEGATIVE
Hgb urine dipstick: NEGATIVE
Ketones, ur: NEGATIVE mg/dL
Nitrite: NEGATIVE
pH: 7 (ref 5.0–8.0)

## 2011-09-20 MED ORDER — IOHEXOL 300 MG/ML  SOLN
100.0000 mL | Freq: Once | INTRAMUSCULAR | Status: AC | PRN
  Administered 2011-09-20: 100 mL via INTRAVENOUS

## 2012-01-06 DIAGNOSIS — I10 Essential (primary) hypertension: Secondary | ICD-10-CM | POA: Diagnosis not present

## 2012-01-06 DIAGNOSIS — I25810 Atherosclerosis of coronary artery bypass graft(s) without angina pectoris: Secondary | ICD-10-CM | POA: Diagnosis not present

## 2012-01-06 DIAGNOSIS — R079 Chest pain, unspecified: Secondary | ICD-10-CM | POA: Diagnosis not present

## 2012-01-06 DIAGNOSIS — I6529 Occlusion and stenosis of unspecified carotid artery: Secondary | ICD-10-CM | POA: Diagnosis not present

## 2012-01-27 DIAGNOSIS — H5340 Unspecified visual field defects: Secondary | ICD-10-CM | POA: Diagnosis not present

## 2012-01-27 DIAGNOSIS — H3530 Unspecified macular degeneration: Secondary | ICD-10-CM | POA: Diagnosis not present

## 2012-02-10 DIAGNOSIS — L570 Actinic keratosis: Secondary | ICD-10-CM | POA: Diagnosis not present

## 2012-02-18 DIAGNOSIS — E785 Hyperlipidemia, unspecified: Secondary | ICD-10-CM | POA: Diagnosis not present

## 2012-02-18 DIAGNOSIS — I10 Essential (primary) hypertension: Secondary | ICD-10-CM | POA: Diagnosis not present

## 2012-02-18 DIAGNOSIS — E039 Hypothyroidism, unspecified: Secondary | ICD-10-CM | POA: Diagnosis not present

## 2012-02-24 DIAGNOSIS — I359 Nonrheumatic aortic valve disorder, unspecified: Secondary | ICD-10-CM | POA: Diagnosis not present

## 2012-02-24 DIAGNOSIS — I6529 Occlusion and stenosis of unspecified carotid artery: Secondary | ICD-10-CM | POA: Diagnosis not present

## 2012-02-24 DIAGNOSIS — I25810 Atherosclerosis of coronary artery bypass graft(s) without angina pectoris: Secondary | ICD-10-CM | POA: Diagnosis not present

## 2012-02-24 DIAGNOSIS — I10 Essential (primary) hypertension: Secondary | ICD-10-CM | POA: Diagnosis not present

## 2012-03-10 DIAGNOSIS — Z951 Presence of aortocoronary bypass graft: Secondary | ICD-10-CM | POA: Diagnosis not present

## 2012-03-10 DIAGNOSIS — I2510 Atherosclerotic heart disease of native coronary artery without angina pectoris: Secondary | ICD-10-CM | POA: Diagnosis not present

## 2012-03-10 DIAGNOSIS — E782 Mixed hyperlipidemia: Secondary | ICD-10-CM | POA: Diagnosis not present

## 2012-03-30 ENCOUNTER — Other Ambulatory Visit (HOSPITAL_COMMUNITY): Payer: Self-pay | Admitting: Internal Medicine

## 2012-03-30 DIAGNOSIS — Z1231 Encounter for screening mammogram for malignant neoplasm of breast: Secondary | ICD-10-CM

## 2012-04-25 ENCOUNTER — Ambulatory Visit (HOSPITAL_COMMUNITY)
Admission: RE | Admit: 2012-04-25 | Discharge: 2012-04-25 | Disposition: A | Discharge status: Home / Self Care | Payer: Medicare Other | Source: Ambulatory Visit | Attending: Internal Medicine | Admitting: Internal Medicine

## 2012-04-25 DIAGNOSIS — Z1231 Encounter for screening mammogram for malignant neoplasm of breast: Secondary | ICD-10-CM | POA: Diagnosis not present

## 2012-05-03 ENCOUNTER — Other Ambulatory Visit: Payer: Self-pay | Admitting: Dermatology

## 2012-05-03 DIAGNOSIS — L570 Actinic keratosis: Secondary | ICD-10-CM | POA: Diagnosis not present

## 2012-05-03 DIAGNOSIS — D485 Neoplasm of uncertain behavior of skin: Secondary | ICD-10-CM | POA: Diagnosis not present

## 2012-05-03 DIAGNOSIS — L820 Inflamed seborrheic keratosis: Secondary | ICD-10-CM | POA: Diagnosis not present

## 2012-05-09 ENCOUNTER — Emergency Department (HOSPITAL_COMMUNITY)
Admission: EM | Admit: 2012-05-09 | Discharge: 2012-05-10 | Disposition: A | Discharge status: Home Health | Payer: Medicare Other | Attending: Emergency Medicine | Admitting: Emergency Medicine

## 2012-05-09 ENCOUNTER — Encounter (HOSPITAL_COMMUNITY): Payer: Self-pay | Admitting: Emergency Medicine

## 2012-05-09 DIAGNOSIS — I493 Ventricular premature depolarization: Secondary | ICD-10-CM

## 2012-05-09 DIAGNOSIS — I10 Essential (primary) hypertension: Secondary | ICD-10-CM | POA: Insufficient documentation

## 2012-05-09 DIAGNOSIS — E079 Disorder of thyroid, unspecified: Secondary | ICD-10-CM | POA: Insufficient documentation

## 2012-05-09 DIAGNOSIS — R002 Palpitations: Secondary | ICD-10-CM | POA: Insufficient documentation

## 2012-05-09 DIAGNOSIS — I4949 Other premature depolarization: Secondary | ICD-10-CM | POA: Diagnosis not present

## 2012-05-09 DIAGNOSIS — J9819 Other pulmonary collapse: Secondary | ICD-10-CM | POA: Diagnosis not present

## 2012-05-09 HISTORY — DX: Essential (primary) hypertension: I10

## 2012-05-09 NOTE — ED Provider Notes (Signed)
History     CSN: [id number]  Arrival date & time 05/09/12  2324   First MD Initiated Contact with Patient 05/09/12 2346      Chief Complaint  Patient presents with  . Palpitations    (Consider location/radiation/quality/duration/timing/severity/associated sxs/prior treatment) HPI Comments: 78 year old female with a history of hypertension and thyroid disease who is status post CABG surgery 16 years ago who presents with recurrent palpitations. According to the patient this happens occasionally, last dose several years ago but has recurred in the last 24 hours. The palpitations are persistent over time, intermittent throughout the day, has had one hour of palpitation protime but seems to come back this evening. She denies associated chest pain, shortness of breath or nausea and has had a good appetite. She denies any changes in her thyroid medications, no changes in her dietary intake of caffeine or stimulants, does not partake in any legal drugs, alcohol, tobacco or other stimulants. She does admit to having a high stress situation at home over the last week with her son who has early Alzheimer's disease. Currently her symptoms are mild, she complains of a skipped heart beat approximately every 6-10 beats, this is not constant and she has no lightheadedness or near syncope. She states that her last stress test was the last 2 years and last saw her cardiologist 2 months ago and was cleared of any  cardiac ischemia or obstruction per the patient and her family member.  Patient is a 78 y.o. female presenting with palpitations. The history is provided by the patient and a relative.  Palpitations     Past Medical History  Diagnosis Date  . Hypertension   . Thyroid disease     Past Surgical History  Procedure Date  . Coronary artery bypass graft   . Appendectomy   . Abdominal hysterectomy   . Tonsillectomy     No family history on file.  History  Substance Use Topics  . Smoking  status: Never Smoker   . Smokeless tobacco: Not on file  . Alcohol Use: No    OB History    Grav Para Term Preterm Abortions TAB SAB Ect Mult Living                  Review of Systems  Cardiovascular: Positive for palpitations.  All other systems reviewed and are negative.    Allergies  Review of patient's allergies indicates not on file.  Home Medications  No current outpatient prescriptions on file.  BP 161/54  Pulse 63  Temp(Src) 98.2 F (36.8 C) (Oral)  Resp 19  Wt 193 lb (87.544 kg)  SpO2 98%  Physical Exam  Nursing note and vitals reviewed. Constitutional: She appears well-developed and well-nourished. No distress.  HENT:  Head: Normocephalic and atraumatic.  Mouth/Throat: Oropharynx is clear and moist. No oropharyngeal exudate.  Eyes: Conjunctivae and EOM are normal. Pupils are equal, round, and reactive to light. Right eye exhibits no discharge. Left eye exhibits no discharge. No scleral icterus.  Neck: Normal range of motion. Neck supple. No JVD present. No thyromegaly present.  Cardiovascular: Normal rate, regular rhythm and intact distal pulses.  Exam reveals no gallop and no friction rub.   Murmur ( Systolic) heard. Pulmonary/Chest: Effort normal and breath sounds normal. No respiratory distress. She has no wheezes. She has no rales.  Abdominal: Soft. Bowel sounds are normal. She exhibits no distension and no mass. There is no tenderness.  Musculoskeletal: Normal range of motion. She exhibits no edema  and no tenderness.  Lymphadenopathy:    She has no cervical adenopathy.  Neurological: She is alert. Coordination normal.  Skin: Skin is warm and dry. No rash noted. No erythema.  Psychiatric: She has a normal mood and affect. Her behavior is normal.    ED Course  Procedures (including critical care time)  ED ECG REPORT   Date: 05/10/2012   Rate: 72  Rhythm: normal sinus rhythm  QRS Axis: normal  Intervals: PR prolonged  ST/T Wave abnormalities:  normal  Conduction Disutrbances:first-degree A-V block  and Premature ventricular contraction  Narrative Interpretation:   Old EKG Reviewed: Compared with an EKG from 07/29/2011, no significant changes including ectopy.   Labs Reviewed  CBC - Abnormal; Notable for the following:    RBC 3.74 (*)    Hemoglobin 11.7 (*)    HCT 35.5 (*)    All other components within normal limits  BASIC METABOLIC PANEL - Abnormal; Notable for the following:    Glucose, Bld 120 (*)    GFR calc non Af Amer 64 (*)    GFR calc Af Amer 74 (*)    All other components within normal limits  DIFFERENTIAL  URINALYSIS, ROUTINE W REFLEX MICROSCOPIC  TROPONIN I   Dg Chest Port 1 View  05/10/2012  *RADIOLOGY REPORT*  Clinical Data: Heart palpitations for 24 hours.  PORTABLE CHEST - 1 VIEW  Comparison: 07/29/2011  Findings: Stable appearance of postoperative changes in the mediastinum.  Shallow inspiration with atelectasis in the lung bases.  Mild cardiac enlargement with normal pulmonary vascularity. No focal airspace consolidation.  No blunting of costophrenic angles.  No pneumothorax.  Tortuous and calcified aorta.  IMPRESSION: Shallow inspiration with developing atelectasis in the lung bases since the previous study.  Original Report Authenticated By: Marlon Pel, M.D.     1. Palpitations   2. PVC (premature ventricular contraction)       MDM  Overall the patient appears well, her legs are symmetrical, her right leg has venous grafting scars which are well-healed and there is no significant edema.  EKG shows an isolated PVC which is consistent with my exam and having occasional ectopy. Her blood pressure is significantly elevated last checked at 210/90. Will start workup with laboratory evaluation to rule out electrolyte disturbance, dehydration, saline lock, chest x-ray, followup blood pressure.   Blood pressure has spontaneously improved to 160/90, patient has continued occasional PVCs, lab work does  not show any etiology including normal electrolytes, normal troponin, normal urinalysis without dehydration and only a mild anemia. The chest x-ray has been reviewed by myself and shows atelectasis at the lung bases but no significant changes from a prior x-ray from 2012. The patient has been informed of the results and encouraged to followup with cardiology this week. She is expressed her understanding. I have also given her precautions for avoidance of stimulants and indications for return.  Vida Roller, MD 05/10/12 [phone]

## 2012-05-09 NOTE — ED Notes (Signed)
Pt alert, nad, c/o "heart alpitations", onset last pm, denies chest pain, or sob, denies n/v, skin pwd, resp even unlabored.

## 2012-05-10 ENCOUNTER — Emergency Department (HOSPITAL_COMMUNITY): Payer: Medicare Other

## 2012-05-10 DIAGNOSIS — J9819 Other pulmonary collapse: Secondary | ICD-10-CM | POA: Diagnosis not present

## 2012-05-10 DIAGNOSIS — R002 Palpitations: Secondary | ICD-10-CM | POA: Diagnosis not present

## 2012-05-10 LAB — BASIC METABOLIC PANEL
BUN: 18 mg/dL (ref 6–23)
CO2: 26 mEq/L (ref 19–32)
Calcium: 9.1 mg/dL (ref 8.4–10.5)
Chloride: 101 mEq/L (ref 96–112)
Creatinine, Ser: 0.83 mg/dL (ref 0.50–1.10)
GFR calc Af Amer: 74 mL/min — ABNORMAL LOW (ref >90)
GFR calc non Af Amer: 64 mL/min — ABNORMAL LOW (ref >90)
Glucose, Bld: 120 mg/dL — ABNORMAL HIGH (ref 70–99)
Potassium: 4 mEq/L (ref 3.5–5.1)
Sodium: 136 mEq/L (ref 135–145)

## 2012-05-10 LAB — URINALYSIS, ROUTINE W REFLEX MICROSCOPIC
Bilirubin Urine: NEGATIVE
Glucose, UA: NEGATIVE mg/dL
Hgb urine dipstick: NEGATIVE
Ketones, ur: NEGATIVE mg/dL
Leukocytes, UA: NEGATIVE
Nitrite: NEGATIVE
Protein, ur: NEGATIVE mg/dL
Specific Gravity, Urine: 1.011 (ref 1.005–1.030)
Urobilinogen, UA: 0.2 mg/dL (ref 0.0–1.0)
pH: 6.5 (ref 5.0–8.0)

## 2012-05-10 LAB — DIFFERENTIAL
Basophils Relative: 1 % (ref 0–1)
Eosinophils Absolute: 0.1 10*3/uL (ref 0.0–0.7)
Eosinophils Relative: 2 % (ref 0–5)
Monocytes Absolute: 0.8 10*3/uL (ref 0.1–1.0)
Monocytes Relative: 11 % (ref 3–12)
Neutrophils Relative %: 53 % (ref 43–77)

## 2012-05-10 LAB — CBC
HCT: 35.5 % — ABNORMAL LOW (ref 36.0–46.0)
Hemoglobin: 11.7 g/dL — ABNORMAL LOW (ref 12.0–15.0)
MCH: 31.3 pg (ref 26.0–34.0)
MCHC: 33 g/dL (ref 30.0–36.0)
MCV: 94.9 fL (ref 78.0–100.0)

## 2012-05-10 MED ORDER — ZOLPIDEM TARTRATE 5 MG PO TABS: 5.0000 mg | ORAL_TABLET | Freq: Every evening | ORAL | Status: DC | PRN

## 2012-05-10 NOTE — Discharge Instructions (Signed)
Your x-ray and blood test tonight show no signs or symptoms of anything related to your heart, no signs of heart attack, no signs of electrolyte abnormality. Your heart is having the occasional extra beat. This is a common finding however your extra beat is happening more frequently than usual and should be followed up by a cardiologist within the next 2 days. If you should notice increasing frequency of these symptoms, lightheadedness or feeling like you're going to pass out or if you develop chest pain or shortness of breath return to the hospital immediately for a repeat evaluation. Please avoid over-the-counter medications for sinus infections, congestion, cough or allergies as they can make this worse. Also avoid caffeine, coffee, diet drinks or other stimulants such as alcohol or tobacco. If you continue to have a cough I would encourage you to have your family doctor order a CT scan of your chest to further evaluate the source of the cough. There does not appear to be any signs of pneumonia or lung cancer on your chest x-ray today.

## 2012-05-25 DIAGNOSIS — R002 Palpitations: Secondary | ICD-10-CM | POA: Diagnosis not present

## 2012-05-25 DIAGNOSIS — I4949 Other premature depolarization: Secondary | ICD-10-CM | POA: Diagnosis not present

## 2012-05-25 DIAGNOSIS — I2510 Atherosclerotic heart disease of native coronary artery without angina pectoris: Secondary | ICD-10-CM | POA: Diagnosis not present

## 2012-06-02 DIAGNOSIS — R002 Palpitations: Secondary | ICD-10-CM | POA: Diagnosis not present

## 2012-06-02 DIAGNOSIS — I2510 Atherosclerotic heart disease of native coronary artery without angina pectoris: Secondary | ICD-10-CM | POA: Diagnosis not present

## 2012-06-03 DIAGNOSIS — R059 Cough, unspecified: Secondary | ICD-10-CM | POA: Diagnosis not present

## 2012-06-03 DIAGNOSIS — E039 Hypothyroidism, unspecified: Secondary | ICD-10-CM | POA: Diagnosis not present

## 2012-06-03 DIAGNOSIS — I10 Essential (primary) hypertension: Secondary | ICD-10-CM | POA: Diagnosis not present

## 2012-06-03 DIAGNOSIS — R002 Palpitations: Secondary | ICD-10-CM | POA: Diagnosis not present

## 2012-06-03 DIAGNOSIS — R05 Cough: Secondary | ICD-10-CM | POA: Diagnosis not present

## 2012-07-26 ENCOUNTER — Other Ambulatory Visit: Payer: Self-pay | Admitting: Physician Assistant

## 2012-07-26 DIAGNOSIS — D485 Neoplasm of uncertain behavior of skin: Secondary | ICD-10-CM | POA: Diagnosis not present

## 2012-07-26 DIAGNOSIS — D0430 Carcinoma in situ of skin of unspecified part of face: Secondary | ICD-10-CM | POA: Diagnosis not present

## 2012-07-26 DIAGNOSIS — D0439 Carcinoma in situ of skin of other parts of face: Secondary | ICD-10-CM | POA: Diagnosis not present

## 2012-07-26 DIAGNOSIS — L570 Actinic keratosis: Secondary | ICD-10-CM | POA: Diagnosis not present

## 2012-08-08 IMAGING — CR DG CHEST 2V
2 series · 2 of 2 positions shown · non-contrast
Comparison: 05/03/2005.

CLINICAL DATA: Palpitations.

CHEST - 2 VIEW

[w chest pa]
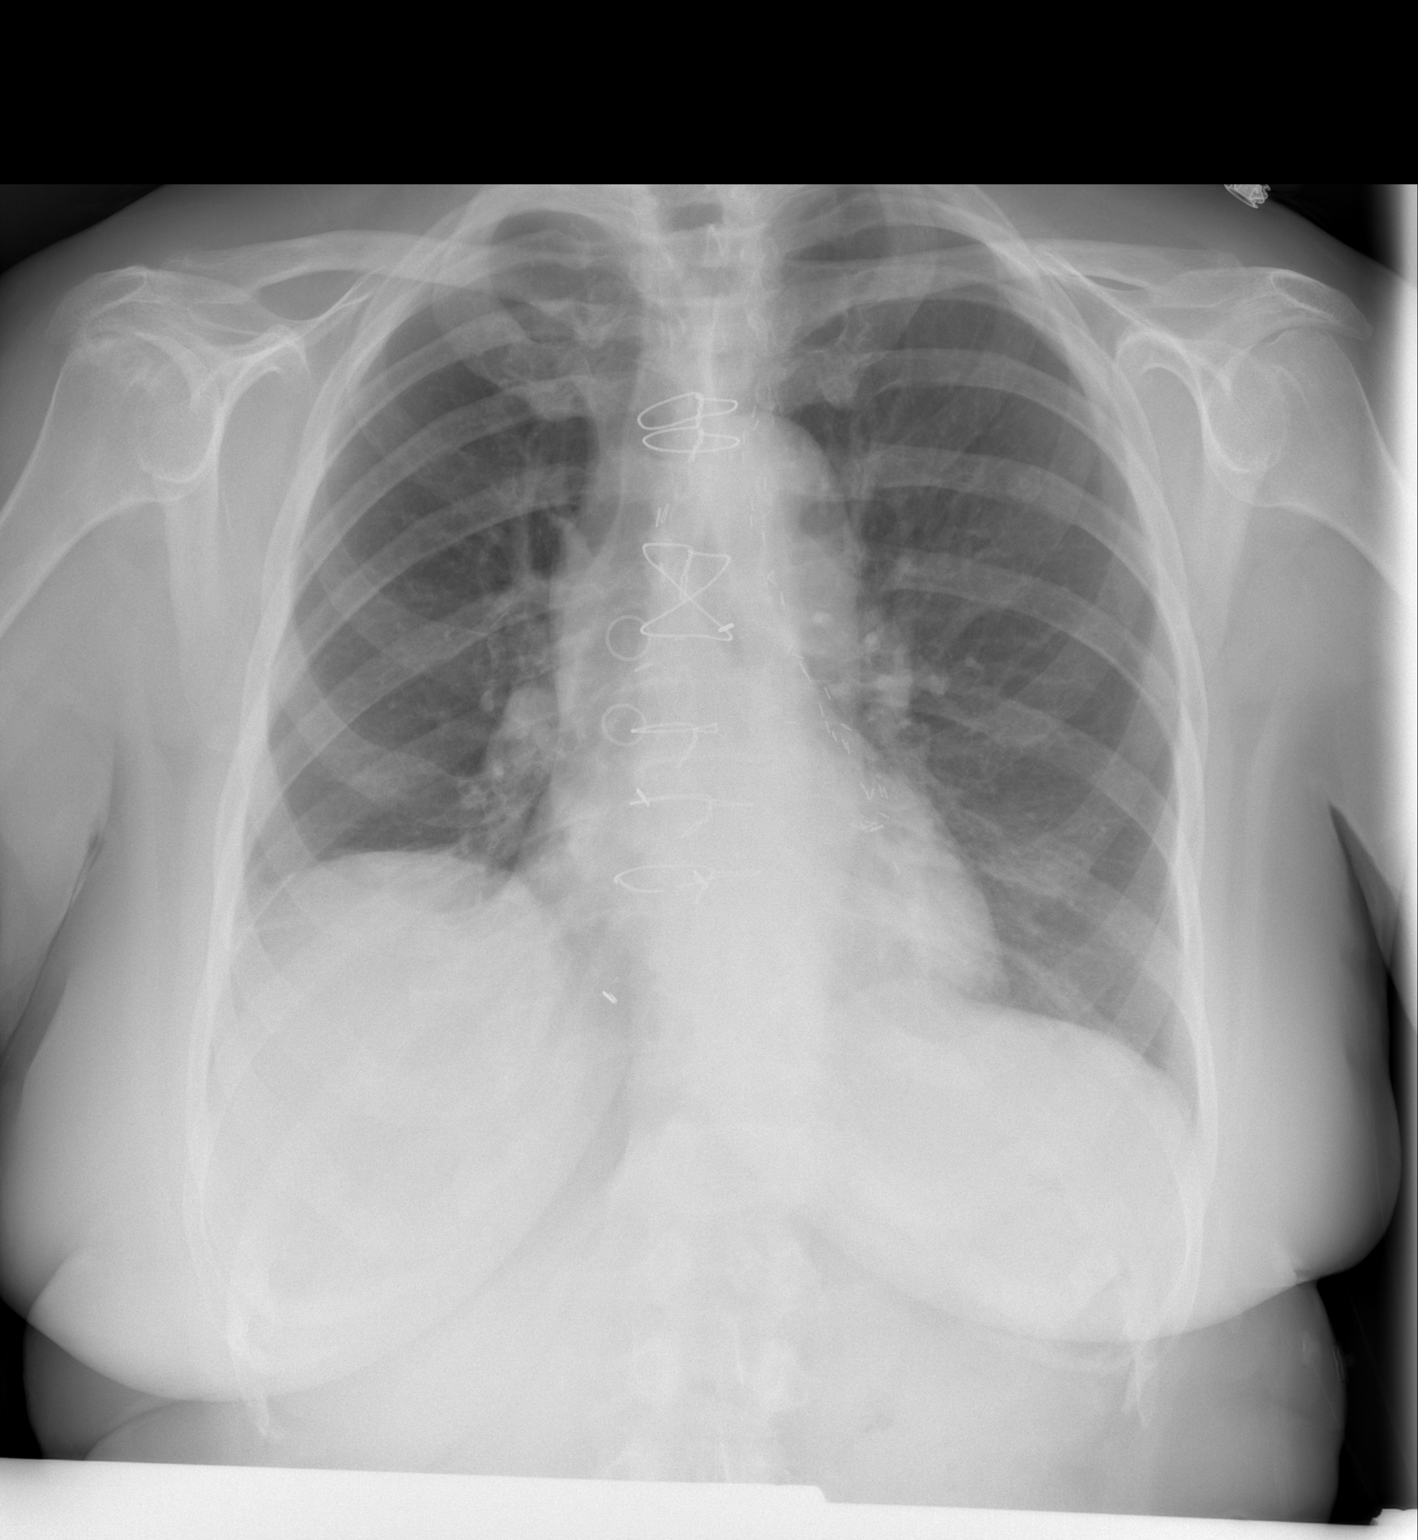

[w chest lat]
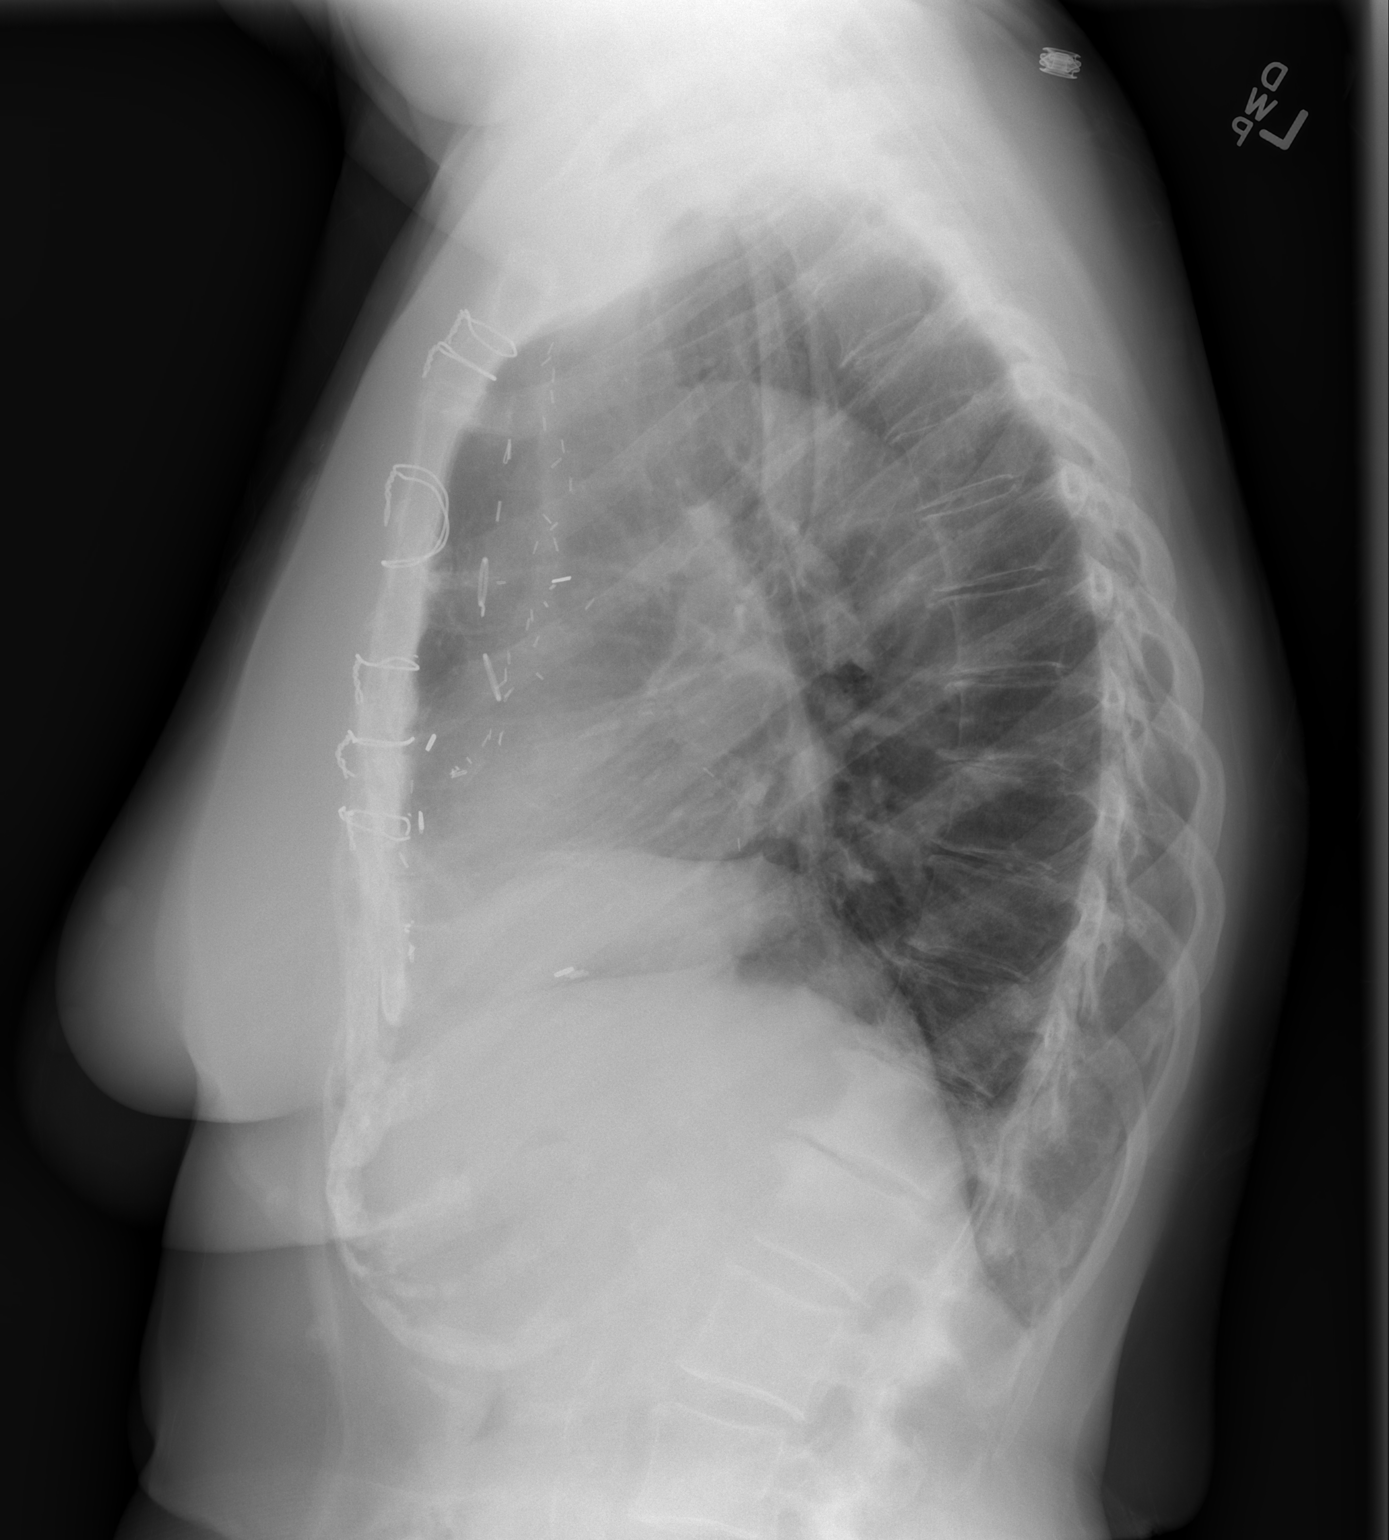

[2 of 2 positions shown; findings below may reference images not displayed]

FINDINGS: Trachea is midline.  Heart size normal.  Linear
subsegmental atelectasis or scarring at the right lung base.  Lungs
are otherwise clear.  No pleural fluid.
IMPRESSION: No acute findings.

## 2012-08-31 DIAGNOSIS — E782 Mixed hyperlipidemia: Secondary | ICD-10-CM | POA: Diagnosis not present

## 2012-08-31 DIAGNOSIS — R061 Stridor: Secondary | ICD-10-CM | POA: Diagnosis not present

## 2012-08-31 DIAGNOSIS — I2510 Atherosclerotic heart disease of native coronary artery without angina pectoris: Secondary | ICD-10-CM | POA: Diagnosis not present

## 2012-08-31 DIAGNOSIS — I10 Essential (primary) hypertension: Secondary | ICD-10-CM | POA: Diagnosis not present

## 2012-08-31 DIAGNOSIS — R002 Palpitations: Secondary | ICD-10-CM | POA: Diagnosis not present

## 2012-09-06 DIAGNOSIS — M899 Disorder of bone, unspecified: Secondary | ICD-10-CM | POA: Diagnosis not present

## 2012-09-06 DIAGNOSIS — E039 Hypothyroidism, unspecified: Secondary | ICD-10-CM | POA: Diagnosis not present

## 2012-09-06 DIAGNOSIS — I10 Essential (primary) hypertension: Secondary | ICD-10-CM | POA: Diagnosis not present

## 2012-09-06 DIAGNOSIS — M949 Disorder of cartilage, unspecified: Secondary | ICD-10-CM | POA: Diagnosis not present

## 2012-09-06 DIAGNOSIS — E785 Hyperlipidemia, unspecified: Secondary | ICD-10-CM | POA: Diagnosis not present

## 2012-09-06 DIAGNOSIS — R82998 Other abnormal findings in urine: Secondary | ICD-10-CM | POA: Diagnosis not present

## 2012-09-27 DIAGNOSIS — I10 Essential (primary) hypertension: Secondary | ICD-10-CM | POA: Diagnosis not present

## 2012-09-27 DIAGNOSIS — Z0000 Encounter for general adult medical examination without abnormal findings: Secondary | ICD-10-CM | POA: Diagnosis not present

## 2012-09-27 DIAGNOSIS — Z23 Encounter for immunization: Secondary | ICD-10-CM | POA: Diagnosis not present

## 2012-09-27 DIAGNOSIS — F329 Major depressive disorder, single episode, unspecified: Secondary | ICD-10-CM | POA: Diagnosis not present

## 2012-09-27 DIAGNOSIS — Z9289 Personal history of other medical treatment: Secondary | ICD-10-CM

## 2012-09-27 DIAGNOSIS — F3289 Other specified depressive episodes: Secondary | ICD-10-CM | POA: Diagnosis not present

## 2012-09-27 DIAGNOSIS — I359 Nonrheumatic aortic valve disorder, unspecified: Secondary | ICD-10-CM | POA: Diagnosis not present

## 2012-09-27 HISTORY — DX: Personal history of other medical treatment: Z92.89

## 2012-09-29 DIAGNOSIS — E782 Mixed hyperlipidemia: Secondary | ICD-10-CM | POA: Diagnosis not present

## 2012-09-29 DIAGNOSIS — I2510 Atherosclerotic heart disease of native coronary artery without angina pectoris: Secondary | ICD-10-CM | POA: Diagnosis not present

## 2012-09-29 DIAGNOSIS — I10 Essential (primary) hypertension: Secondary | ICD-10-CM | POA: Diagnosis not present

## 2012-10-06 DIAGNOSIS — D0430 Carcinoma in situ of skin of unspecified part of face: Secondary | ICD-10-CM | POA: Diagnosis not present

## 2012-10-06 DIAGNOSIS — D0439 Carcinoma in situ of skin of other parts of face: Secondary | ICD-10-CM | POA: Diagnosis not present

## 2012-10-27 DIAGNOSIS — I2510 Atherosclerotic heart disease of native coronary artery without angina pectoris: Secondary | ICD-10-CM | POA: Diagnosis not present

## 2012-10-27 DIAGNOSIS — E782 Mixed hyperlipidemia: Secondary | ICD-10-CM | POA: Diagnosis not present

## 2012-10-27 DIAGNOSIS — I10 Essential (primary) hypertension: Secondary | ICD-10-CM | POA: Diagnosis not present

## 2012-11-10 ENCOUNTER — Other Ambulatory Visit (HOSPITAL_COMMUNITY): Payer: Self-pay | Admitting: Cardiology

## 2012-11-10 DIAGNOSIS — I6529 Occlusion and stenosis of unspecified carotid artery: Secondary | ICD-10-CM

## 2012-11-23 DIAGNOSIS — R002 Palpitations: Secondary | ICD-10-CM | POA: Diagnosis not present

## 2012-12-07 ENCOUNTER — Ambulatory Visit (HOSPITAL_COMMUNITY)
Admission: RE | Admit: 2012-12-07 | Discharge: 2012-12-07 | Disposition: A | Discharge status: Home / Self Care | Payer: Medicare Other | Source: Ambulatory Visit | Attending: Cardiovascular Disease | Admitting: Cardiovascular Disease

## 2012-12-07 DIAGNOSIS — R0989 Other specified symptoms and signs involving the circulatory and respiratory systems: Secondary | ICD-10-CM | POA: Diagnosis not present

## 2012-12-07 DIAGNOSIS — I6529 Occlusion and stenosis of unspecified carotid artery: Secondary | ICD-10-CM | POA: Diagnosis not present

## 2012-12-07 HISTORY — PX: OTHER SURGICAL HISTORY: SHX169

## 2012-12-08 NOTE — Progress Notes (Signed)
Carotid duplex completed. Eufemio Strahm D  

## 2012-12-13 DIAGNOSIS — I10 Essential (primary) hypertension: Secondary | ICD-10-CM | POA: Diagnosis not present

## 2012-12-13 DIAGNOSIS — I2510 Atherosclerotic heart disease of native coronary artery without angina pectoris: Secondary | ICD-10-CM | POA: Diagnosis not present

## 2012-12-13 DIAGNOSIS — E782 Mixed hyperlipidemia: Secondary | ICD-10-CM | POA: Diagnosis not present

## 2012-12-14 DIAGNOSIS — Z961 Presence of intraocular lens: Secondary | ICD-10-CM | POA: Diagnosis not present

## 2012-12-14 DIAGNOSIS — H47239 Glaucomatous optic atrophy, unspecified eye: Secondary | ICD-10-CM | POA: Diagnosis not present

## 2013-03-08 ENCOUNTER — Other Ambulatory Visit: Payer: Self-pay | Admitting: Dermatology

## 2013-03-08 DIAGNOSIS — D0439 Carcinoma in situ of skin of other parts of face: Secondary | ICD-10-CM | POA: Diagnosis not present

## 2013-03-08 DIAGNOSIS — D0430 Carcinoma in situ of skin of unspecified part of face: Secondary | ICD-10-CM | POA: Diagnosis not present

## 2013-03-08 DIAGNOSIS — D485 Neoplasm of uncertain behavior of skin: Secondary | ICD-10-CM | POA: Diagnosis not present

## 2013-03-08 DIAGNOSIS — D0420 Carcinoma in situ of skin of unspecified ear and external auricular canal: Secondary | ICD-10-CM | POA: Diagnosis not present

## 2013-03-08 DIAGNOSIS — H61009 Unspecified perichondritis of external ear, unspecified ear: Secondary | ICD-10-CM | POA: Diagnosis not present

## 2013-03-17 DIAGNOSIS — Z0289 Encounter for other administrative examinations: Secondary | ICD-10-CM | POA: Diagnosis not present

## 2013-03-17 DIAGNOSIS — Z111 Encounter for screening for respiratory tuberculosis: Secondary | ICD-10-CM | POA: Diagnosis not present

## 2013-03-20 DIAGNOSIS — Z23 Encounter for immunization: Secondary | ICD-10-CM | POA: Diagnosis not present

## 2013-03-23 DIAGNOSIS — M719 Bursopathy, unspecified: Secondary | ICD-10-CM | POA: Diagnosis not present

## 2013-03-23 DIAGNOSIS — M5030 Other cervical disc degeneration, unspecified cervical region: Secondary | ICD-10-CM | POA: Diagnosis not present

## 2013-03-23 DIAGNOSIS — M67919 Unspecified disorder of synovium and tendon, unspecified shoulder: Secondary | ICD-10-CM | POA: Diagnosis not present

## 2013-03-23 DIAGNOSIS — IMO0002 Reserved for concepts with insufficient information to code with codable children: Secondary | ICD-10-CM | POA: Diagnosis not present

## 2013-03-23 DIAGNOSIS — M1710 Unilateral primary osteoarthritis, unspecified knee: Secondary | ICD-10-CM | POA: Diagnosis not present

## 2013-03-23 DIAGNOSIS — M76899 Other specified enthesopathies of unspecified lower limb, excluding foot: Secondary | ICD-10-CM | POA: Diagnosis not present

## 2013-03-29 DIAGNOSIS — Z1331 Encounter for screening for depression: Secondary | ICD-10-CM | POA: Diagnosis not present

## 2013-03-29 DIAGNOSIS — I6529 Occlusion and stenosis of unspecified carotid artery: Secondary | ICD-10-CM | POA: Diagnosis not present

## 2013-03-29 DIAGNOSIS — I10 Essential (primary) hypertension: Secondary | ICD-10-CM | POA: Diagnosis not present

## 2013-03-29 DIAGNOSIS — I25810 Atherosclerosis of coronary artery bypass graft(s) without angina pectoris: Secondary | ICD-10-CM | POA: Diagnosis not present

## 2013-03-29 DIAGNOSIS — K219 Gastro-esophageal reflux disease without esophagitis: Secondary | ICD-10-CM | POA: Diagnosis not present

## 2013-03-31 DIAGNOSIS — M2010 Hallux valgus (acquired), unspecified foot: Secondary | ICD-10-CM | POA: Diagnosis not present

## 2013-03-31 DIAGNOSIS — M7150 Other bursitis, not elsewhere classified, unspecified site: Secondary | ICD-10-CM | POA: Diagnosis not present

## 2013-03-31 DIAGNOSIS — M25579 Pain in unspecified ankle and joints of unspecified foot: Secondary | ICD-10-CM | POA: Diagnosis not present

## 2013-03-31 DIAGNOSIS — M79609 Pain in unspecified limb: Secondary | ICD-10-CM | POA: Diagnosis not present

## 2013-04-06 DIAGNOSIS — D0439 Carcinoma in situ of skin of other parts of face: Secondary | ICD-10-CM | POA: Diagnosis not present

## 2013-04-06 DIAGNOSIS — D044 Carcinoma in situ of skin of scalp and neck: Secondary | ICD-10-CM | POA: Diagnosis not present

## 2013-04-06 DIAGNOSIS — D0430 Carcinoma in situ of skin of unspecified part of face: Secondary | ICD-10-CM | POA: Diagnosis not present

## 2013-04-18 ENCOUNTER — Other Ambulatory Visit (HOSPITAL_COMMUNITY): Payer: Self-pay | Admitting: Cardiovascular Disease

## 2013-04-18 DIAGNOSIS — R6889 Other general symptoms and signs: Secondary | ICD-10-CM | POA: Diagnosis not present

## 2013-04-18 DIAGNOSIS — I351 Nonrheumatic aortic (valve) insufficiency: Secondary | ICD-10-CM

## 2013-04-18 DIAGNOSIS — I509 Heart failure, unspecified: Secondary | ICD-10-CM

## 2013-04-18 DIAGNOSIS — E782 Mixed hyperlipidemia: Secondary | ICD-10-CM | POA: Diagnosis not present

## 2013-04-18 DIAGNOSIS — R531 Weakness: Secondary | ICD-10-CM

## 2013-04-18 DIAGNOSIS — D510 Vitamin B12 deficiency anemia due to intrinsic factor deficiency: Secondary | ICD-10-CM | POA: Diagnosis not present

## 2013-04-18 DIAGNOSIS — R5383 Other fatigue: Secondary | ICD-10-CM | POA: Diagnosis not present

## 2013-04-18 DIAGNOSIS — I2510 Atherosclerotic heart disease of native coronary artery without angina pectoris: Secondary | ICD-10-CM | POA: Diagnosis not present

## 2013-04-18 DIAGNOSIS — R5381 Other malaise: Secondary | ICD-10-CM | POA: Diagnosis not present

## 2013-04-18 DIAGNOSIS — I059 Rheumatic mitral valve disease, unspecified: Secondary | ICD-10-CM | POA: Diagnosis not present

## 2013-04-18 DIAGNOSIS — Q233 Congenital mitral insufficiency: Secondary | ICD-10-CM

## 2013-04-26 ENCOUNTER — Ambulatory Visit (HOSPITAL_COMMUNITY)
Admission: RE | Admit: 2013-04-26 | Discharge: 2013-04-26 | Disposition: A | Discharge status: Home / Self Care | Payer: Medicare Other | Source: Ambulatory Visit | Attending: Cardiovascular Disease | Admitting: Cardiovascular Disease

## 2013-04-26 DIAGNOSIS — I509 Heart failure, unspecified: Secondary | ICD-10-CM | POA: Insufficient documentation

## 2013-04-26 DIAGNOSIS — R5383 Other fatigue: Secondary | ICD-10-CM | POA: Diagnosis not present

## 2013-04-26 DIAGNOSIS — R5381 Other malaise: Secondary | ICD-10-CM | POA: Diagnosis not present

## 2013-04-26 DIAGNOSIS — R531 Weakness: Secondary | ICD-10-CM

## 2013-04-26 DIAGNOSIS — Q233 Congenital mitral insufficiency: Secondary | ICD-10-CM | POA: Diagnosis not present

## 2013-04-26 DIAGNOSIS — I359 Nonrheumatic aortic valve disorder, unspecified: Secondary | ICD-10-CM | POA: Diagnosis not present

## 2013-04-26 DIAGNOSIS — I351 Nonrheumatic aortic (valve) insufficiency: Secondary | ICD-10-CM

## 2013-04-26 NOTE — Progress Notes (Signed)
[facility]   2D echo completed 04/26/2013.   Cindy Mackinley Kiehn, RDCS  

## 2013-04-27 DIAGNOSIS — M5030 Other cervical disc degeneration, unspecified cervical region: Secondary | ICD-10-CM | POA: Diagnosis not present

## 2013-05-02 DIAGNOSIS — R6889 Other general symptoms and signs: Secondary | ICD-10-CM | POA: Diagnosis not present

## 2013-05-21 IMAGING — CR DG CHEST 1V PORT
1 series · 1 of 1 positions shown · non-contrast
Comparison: 07/29/2011

CLINICAL DATA: Heart palpitations for 24 hours.

PORTABLE CHEST - 1 VIEW

[AP]
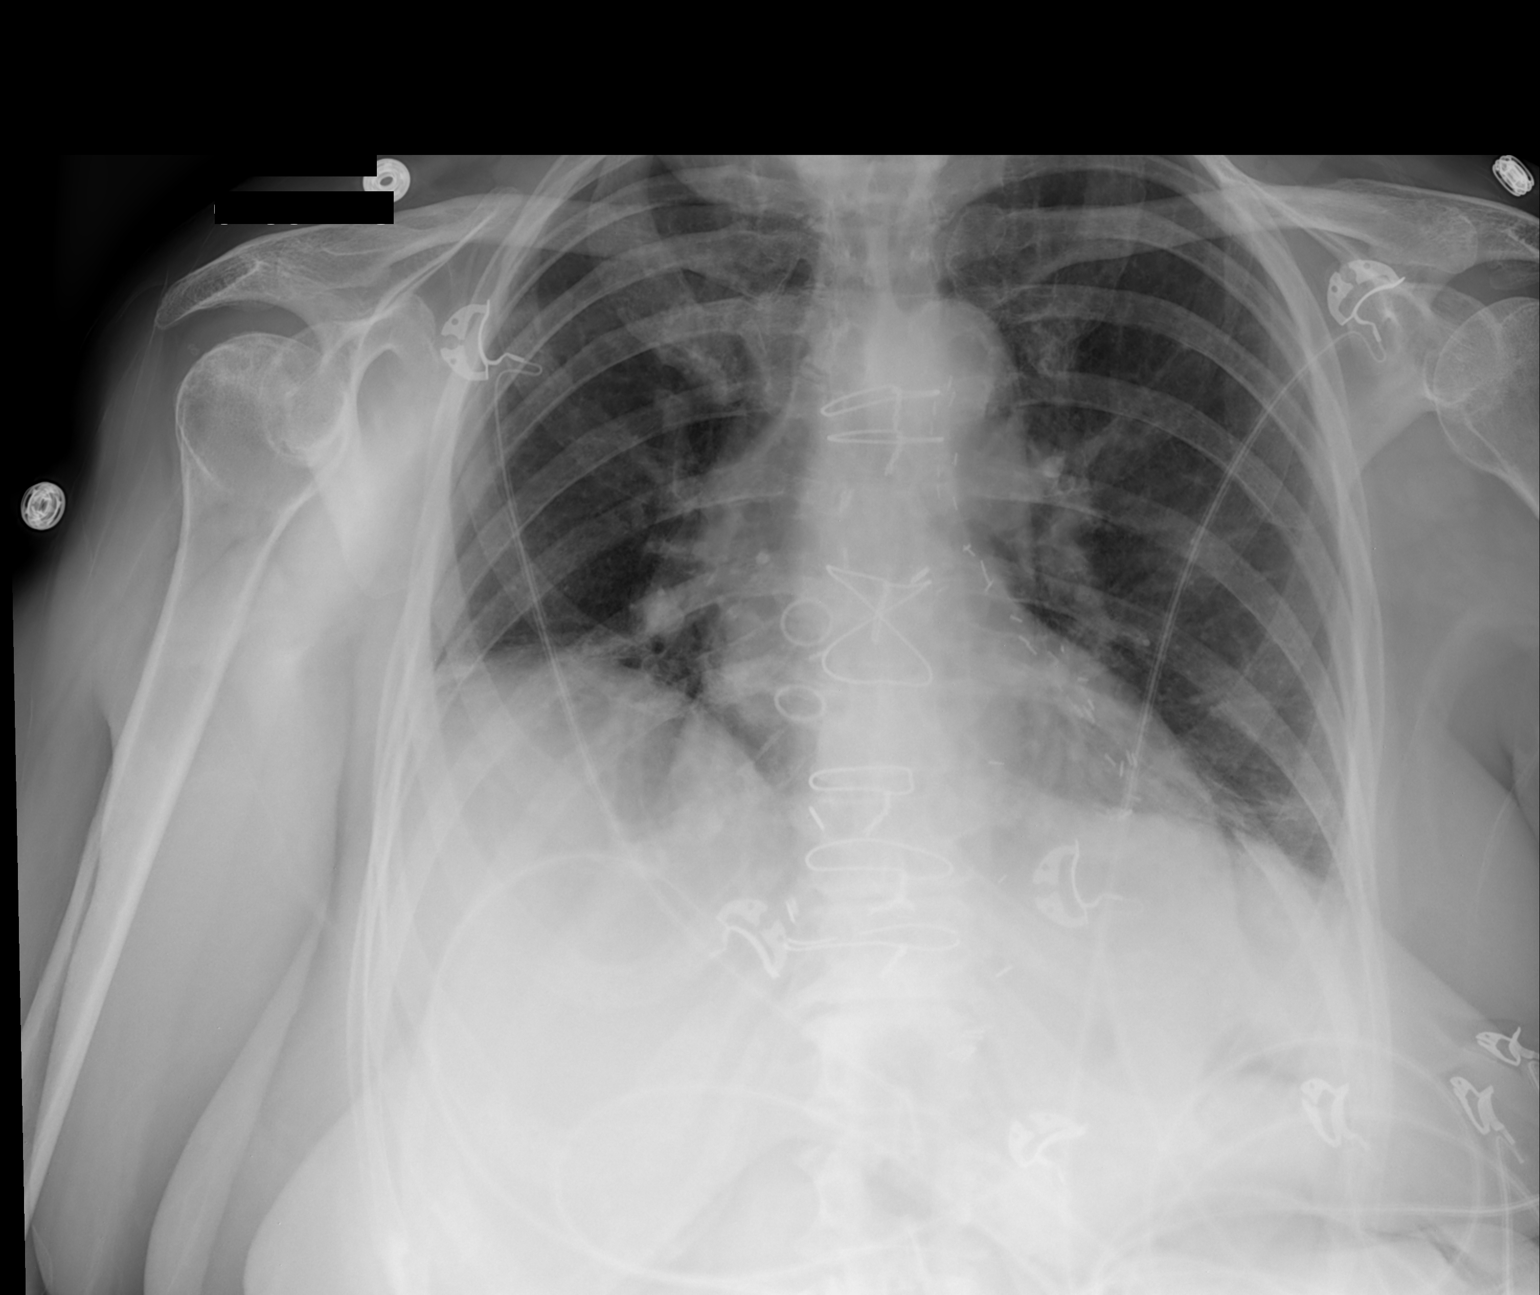

[1 of 1 positions shown; findings below may reference images not displayed]

FINDINGS: Stable appearance of postoperative changes in the
mediastinum.  Shallow inspiration with atelectasis in the lung
bases.  Mild cardiac enlargement with normal pulmonary vascularity.
No focal airspace consolidation.  No blunting of costophrenic
angles.  No pneumothorax.  Tortuous and calcified aorta.
IMPRESSION: Shallow inspiration with developing atelectasis in the lung bases
since the previous study.

## 2013-06-16 ENCOUNTER — Other Ambulatory Visit (HOSPITAL_COMMUNITY): Payer: Self-pay | Admitting: Internal Medicine

## 2013-06-16 DIAGNOSIS — Z1231 Encounter for screening mammogram for malignant neoplasm of breast: Secondary | ICD-10-CM

## 2013-07-03 ENCOUNTER — Ambulatory Visit (HOSPITAL_COMMUNITY)
Admission: RE | Admit: 2013-07-03 | Discharge: 2013-07-03 | Disposition: A | Discharge status: Home / Self Care | Payer: Medicare Other | Source: Ambulatory Visit | Attending: Internal Medicine | Admitting: Internal Medicine

## 2013-07-03 DIAGNOSIS — Z1231 Encounter for screening mammogram for malignant neoplasm of breast: Secondary | ICD-10-CM | POA: Diagnosis not present

## 2013-07-24 ENCOUNTER — Telehealth: Payer: Self-pay | Admitting: Cardiovascular Disease

## 2013-07-24 ENCOUNTER — Other Ambulatory Visit: Payer: Self-pay | Admitting: Dermatology

## 2013-07-24 DIAGNOSIS — L820 Inflamed seborrheic keratosis: Secondary | ICD-10-CM | POA: Diagnosis not present

## 2013-07-24 DIAGNOSIS — D485 Neoplasm of uncertain behavior of skin: Secondary | ICD-10-CM | POA: Diagnosis not present

## 2013-07-24 DIAGNOSIS — C44721 Squamous cell carcinoma of skin of unspecified lower limb, including hip: Secondary | ICD-10-CM | POA: Diagnosis not present

## 2013-07-24 NOTE — Telephone Encounter (Signed)
Kathryn Carlson has called in her prescription for Metoprolol on July 17th to her pharmacy. [facility] is telling her they have not heard anything back from [place] . Please call    Thanks

## 2013-07-25 NOTE — Telephone Encounter (Signed)
Returned call.  Left message that RN checking if refill received as it was sent on yesterday and to call back tomorrow before 4pm if questions.

## 2013-08-14 ENCOUNTER — Other Ambulatory Visit: Payer: Self-pay | Admitting: *Deleted

## 2013-08-14 DIAGNOSIS — R002 Palpitations: Secondary | ICD-10-CM

## 2013-08-14 DIAGNOSIS — I4949 Other premature depolarization: Secondary | ICD-10-CM | POA: Diagnosis not present

## 2013-08-14 DIAGNOSIS — I059 Rheumatic mitral valve disease, unspecified: Secondary | ICD-10-CM | POA: Diagnosis not present

## 2013-08-14 DIAGNOSIS — I10 Essential (primary) hypertension: Secondary | ICD-10-CM | POA: Diagnosis not present

## 2013-08-18 ENCOUNTER — Other Ambulatory Visit: Payer: Self-pay | Admitting: Cardiovascular Disease

## 2013-08-18 DIAGNOSIS — R002 Palpitations: Secondary | ICD-10-CM | POA: Diagnosis not present

## 2013-08-18 DIAGNOSIS — E782 Mixed hyperlipidemia: Secondary | ICD-10-CM | POA: Diagnosis not present

## 2013-08-18 DIAGNOSIS — R5383 Other fatigue: Secondary | ICD-10-CM | POA: Diagnosis not present

## 2013-08-18 DIAGNOSIS — R6889 Other general symptoms and signs: Secondary | ICD-10-CM | POA: Diagnosis not present

## 2013-08-18 DIAGNOSIS — R5381 Other malaise: Secondary | ICD-10-CM | POA: Diagnosis not present

## 2013-08-18 LAB — CBC WITH DIFFERENTIAL/PLATELET
Basophils Absolute: 0.1 10*3/uL (ref 0.0–0.1)
Basophils Relative: 1 % (ref 0–1)
HCT: 34.8 % — ABNORMAL LOW (ref 36.0–46.0)
Lymphocytes Relative: 27 % (ref 12–46)
MCHC: 33.3 g/dL (ref 30.0–36.0)
Monocytes Absolute: 0.8 10*3/uL (ref 0.1–1.0)
Neutro Abs: 4.8 10*3/uL (ref 1.7–7.7)
Neutrophils Relative %: 60 % (ref 43–77)
Platelets: 332 10*3/uL (ref 150–400)
RDW: 14.3 % (ref 11.5–15.5)
WBC: 8 10*3/uL (ref 4.0–10.5)

## 2013-08-18 LAB — LIPID PANEL
HDL: 42 mg/dL (ref >39)
LDL Cholesterol: 66 mg/dL (ref 0–99)
Triglycerides: 253 mg/dL — ABNORMAL HIGH (ref <150)
VLDL: 51 mg/dL — ABNORMAL HIGH (ref 0–40)

## 2013-08-18 LAB — COMPREHENSIVE METABOLIC PANEL
AST: 12 U/L (ref 0–37)
Alkaline Phosphatase: 117 U/L (ref 39–117)
BUN: 20 mg/dL (ref 6–23)
Glucose, Bld: 98 mg/dL (ref 70–99)
Sodium: 138 mEq/L (ref 135–145)
Total Bilirubin: 0.4 mg/dL (ref 0.3–1.2)
Total Protein: 6.5 g/dL (ref 6.0–8.3)

## 2013-08-18 LAB — MAGNESIUM: Magnesium: 1.7 mg/dL (ref 1.5–2.5)

## 2013-08-22 ENCOUNTER — Encounter: Payer: Self-pay | Admitting: Cardiovascular Disease

## 2013-08-23 ENCOUNTER — Ambulatory Visit (HOSPITAL_COMMUNITY)
Admission: RE | Admit: 2013-08-23 | Discharge: 2013-08-23 | Disposition: A | Discharge status: Home / Self Care | Payer: Medicare Other | Source: Ambulatory Visit | Attending: Cardiovascular Disease | Admitting: Cardiovascular Disease

## 2013-08-23 DIAGNOSIS — I10 Essential (primary) hypertension: Secondary | ICD-10-CM | POA: Diagnosis not present

## 2013-08-23 DIAGNOSIS — R011 Cardiac murmur, unspecified: Secondary | ICD-10-CM | POA: Diagnosis not present

## 2013-08-23 DIAGNOSIS — R002 Palpitations: Secondary | ICD-10-CM | POA: Insufficient documentation

## 2013-08-23 HISTORY — PX: TRANSTHORACIC ECHOCARDIOGRAM: SHX275

## 2013-08-23 NOTE — Progress Notes (Signed)
[facility]   2D echo completed 08/23/2013.   Cindy Zakye Baby, RDCS  

## 2013-08-31 ENCOUNTER — Other Ambulatory Visit: Payer: Self-pay | Admitting: Dermatology

## 2013-08-31 ENCOUNTER — Telehealth: Payer: Self-pay | Admitting: Cardiovascular Disease

## 2013-08-31 DIAGNOSIS — L988 Other specified disorders of the skin and subcutaneous tissue: Secondary | ICD-10-CM | POA: Diagnosis not present

## 2013-08-31 DIAGNOSIS — D485 Neoplasm of uncertain behavior of skin: Secondary | ICD-10-CM | POA: Diagnosis not present

## 2013-08-31 NOTE — Telephone Encounter (Signed)
JC, LPN notified and will discuss w/ [name] prior to calling pt with results.

## 2013-08-31 NOTE — Telephone Encounter (Signed)
Pt called wondering about her Echo results she said that it has been over a week. She stated that she has to go eat @ 5:00. (pt is in [facility]) she also stated that she will be at home all morning tomorrow. She would like for a call back she is concerned bc she still has SOB

## 2013-09-01 NOTE — Telephone Encounter (Signed)
Message forwarded to J.C. Wildman, LPN.  

## 2013-09-01 NOTE — Telephone Encounter (Signed)
talked to pt. About her 2-d-echo results .

## 2013-09-13 ENCOUNTER — Other Ambulatory Visit: Payer: Self-pay | Admitting: Cardiovascular Disease

## 2013-09-13 DIAGNOSIS — R6889 Other general symptoms and signs: Secondary | ICD-10-CM | POA: Diagnosis not present

## 2013-09-13 DIAGNOSIS — R5381 Other malaise: Secondary | ICD-10-CM | POA: Diagnosis not present

## 2013-09-13 DIAGNOSIS — R5383 Other fatigue: Secondary | ICD-10-CM | POA: Diagnosis not present

## 2013-09-13 DIAGNOSIS — D649 Anemia, unspecified: Secondary | ICD-10-CM | POA: Diagnosis not present

## 2013-09-14 ENCOUNTER — Telehealth: Payer: Self-pay | Admitting: Cardiovascular Disease

## 2013-09-14 NOTE — Telephone Encounter (Signed)
So sorry Dr Alanda Amass is leaving-would he please recommend a doctor for her to see here.

## 2013-09-14 NOTE — Telephone Encounter (Signed)
Recommended [name] or [name].

## 2013-09-18 ENCOUNTER — Telehealth: Payer: Self-pay | Admitting: Cardiovascular Disease

## 2013-09-18 NOTE — Telephone Encounter (Signed)
[name] gave orders to decrease lopressor to 50mg  daily; pt. informed

## 2013-09-18 NOTE — Telephone Encounter (Signed)
Patient is still feeling very fatigued.  Please call.

## 2013-09-18 NOTE — Telephone Encounter (Signed)
Returned call.  Pt c/o slow heartbeat 38-44 and fatigue.  Pt stated metoprolol was decreased at her last OV and if it was supposed to bring her HR up, it hasn't.  Stated she hasn't noticed a change and wanted [name] to know before he retires.  Pt asked about lab results and results given.  Pt informed [name] will be notified for any specific instructions he may have.  Pt denied pain or other symptoms.  Stated she is just tired all of the time.    Message forwarded to [facility]. Berlinda Last, LPN to discuss w/ [name].  This note and paper chart# [id number] placed on Dr. Kandis Cocking cart.

## 2013-09-20 ENCOUNTER — Telehealth: Payer: Self-pay | Admitting: Cardiovascular Disease

## 2013-09-20 NOTE — Telephone Encounter (Signed)
Patient needs her lab results and what to do about her sed rate.

## 2013-09-20 NOTE — Telephone Encounter (Signed)
Message forwarded to [facility]. Berlinda Last, LPN to discuss w/ [name].  This note and paper chart# [id number] placed on Dr. Kandis Cocking cart.

## 2013-09-26 NOTE — Telephone Encounter (Signed)
Pt. Called and lab slip dent

## 2013-09-26 NOTE — Telephone Encounter (Signed)
sent 

## 2013-10-05 ENCOUNTER — Other Ambulatory Visit: Payer: Self-pay | Admitting: Internal Medicine

## 2013-10-05 DIAGNOSIS — R5383 Other fatigue: Secondary | ICD-10-CM | POA: Diagnosis not present

## 2013-10-05 DIAGNOSIS — R5381 Other malaise: Secondary | ICD-10-CM | POA: Diagnosis not present

## 2013-10-06 LAB — ANA: Anti Nuclear Antibody(ANA): NEGATIVE

## 2013-10-09 DIAGNOSIS — M899 Disorder of bone, unspecified: Secondary | ICD-10-CM | POA: Diagnosis not present

## 2013-10-09 DIAGNOSIS — E785 Hyperlipidemia, unspecified: Secondary | ICD-10-CM | POA: Diagnosis not present

## 2013-10-09 DIAGNOSIS — E039 Hypothyroidism, unspecified: Secondary | ICD-10-CM | POA: Diagnosis not present

## 2013-10-11 DIAGNOSIS — Z23 Encounter for immunization: Secondary | ICD-10-CM | POA: Diagnosis not present

## 2013-10-12 ENCOUNTER — Telehealth: Payer: Self-pay | Admitting: Cardiovascular Disease

## 2013-10-12 NOTE — Telephone Encounter (Signed)
Pt. Called and talked to her about the reason [name] ordered A b12

## 2013-10-12 NOTE — Telephone Encounter (Signed)
Message forwarded to J.C. Wildman, LPN.  

## 2013-10-12 NOTE — Telephone Encounter (Signed)
Please have J C to call her-concerning a test she had and her ins said it was unnecessary.Please try to call before 5.

## 2013-10-16 DIAGNOSIS — I359 Nonrheumatic aortic valve disorder, unspecified: Secondary | ICD-10-CM | POA: Diagnosis not present

## 2013-10-16 DIAGNOSIS — Z0000 Encounter for general adult medical examination without abnormal findings: Secondary | ICD-10-CM | POA: Diagnosis not present

## 2013-10-16 DIAGNOSIS — I25810 Atherosclerosis of coronary artery bypass graft(s) without angina pectoris: Secondary | ICD-10-CM | POA: Diagnosis not present

## 2013-10-16 DIAGNOSIS — D638 Anemia in other chronic diseases classified elsewhere: Secondary | ICD-10-CM | POA: Diagnosis not present

## 2013-10-16 DIAGNOSIS — F329 Major depressive disorder, single episode, unspecified: Secondary | ICD-10-CM | POA: Diagnosis not present

## 2013-10-16 DIAGNOSIS — I6529 Occlusion and stenosis of unspecified carotid artery: Secondary | ICD-10-CM | POA: Diagnosis not present

## 2013-10-16 DIAGNOSIS — F3289 Other specified depressive episodes: Secondary | ICD-10-CM | POA: Diagnosis not present

## 2013-10-16 DIAGNOSIS — E785 Hyperlipidemia, unspecified: Secondary | ICD-10-CM | POA: Diagnosis not present

## 2013-10-16 DIAGNOSIS — E039 Hypothyroidism, unspecified: Secondary | ICD-10-CM | POA: Diagnosis not present

## 2013-10-20 ENCOUNTER — Encounter: Payer: Self-pay | Admitting: *Deleted

## 2013-10-25 ENCOUNTER — Encounter: Payer: Self-pay | Admitting: Internal Medicine

## 2013-10-25 ENCOUNTER — Ambulatory Visit (INDEPENDENT_AMBULATORY_CARE_PROVIDER_SITE_OTHER): Payer: Medicare Other | Admitting: Internal Medicine

## 2013-10-25 VITALS — BP 138/80 | HR 48 | Ht 65.0 in | Wt 203.1 lb

## 2013-10-25 DIAGNOSIS — I498 Other specified cardiac arrhythmias: Secondary | ICD-10-CM

## 2013-10-25 DIAGNOSIS — I2510 Atherosclerotic heart disease of native coronary artery without angina pectoris: Secondary | ICD-10-CM

## 2013-10-25 DIAGNOSIS — Z951 Presence of aortocoronary bypass graft: Secondary | ICD-10-CM | POA: Diagnosis not present

## 2013-10-25 DIAGNOSIS — I10 Essential (primary) hypertension: Secondary | ICD-10-CM | POA: Insufficient documentation

## 2013-10-25 DIAGNOSIS — R001 Bradycardia, unspecified: Secondary | ICD-10-CM | POA: Insufficient documentation

## 2013-10-25 DIAGNOSIS — E785 Hyperlipidemia, unspecified: Secondary | ICD-10-CM | POA: Diagnosis not present

## 2013-10-25 MED ORDER — FENOFIBRATE 48 MG PO TABS: 48.0000 mg | ORAL_TABLET | Freq: Every day | ORAL | Status: DC

## 2013-10-25 NOTE — Patient Instructions (Addendum)
Your physician has recommended that you wear an event monitor. Event monitors are medical devices that record the heart's electrical activity. Doctors most often [place] these monitors to diagnose arrhythmias. Arrhythmias are problems with the speed or rhythm of the heartbeat. The monitor is a small, portable device. You can wear one while you do your normal daily activities. This is usually used to diagnose what is causing palpitations/syncope (passing out). You will schedule an appointment to come back and see one of our nurses to be fitted for this monitor. When you come in for this, you will wear the monitor for 2 weeks.  STOP taking metoprolol (Lopressor). START taking fenofibrate once daily. (this has been sent to your pharmacy)  Your physician recommends that you schedule a follow-up appointment with [name] after you wear your monitor.

## 2013-10-25 NOTE — Progress Notes (Signed)
OFFICE NOTE  Chief Complaint:  Routine follow-up, former Kathryn Carlson patient  Primary Care Physician: Kathryn Meo, MD  HPI:  Kathryn Carlson is a very pleasant 79 year old female presents to follow by Dr. Alanda Carlson with history of coronary artery disease in a first anterior MI in 1997, she underwent three-vessel bypass with 2 grafts that were skipped grafts to 5 separate vessels. This was in 1997 by Kathryn Carlson. Overall she's done well since then with appropriate management of her dyslipidemia, hypertension hypothyroidism and acid reflux. She wore monitor in September of 2013 Kathryn Carlson has had PVCs and bigeminy at times since then. An echocardiogram was performed in August of 2014 which showed an EF of 50-55%, apparently normal diastolic function (however this is almost impossible), mild aortic insufficiency and mild aortic stenosis, mild mitral regurgitation, and left atrial enlargement.  She recently saw Kathryn Carlson for a full physical. Her lipid profile was reperformed which showed total Crestor 171, triglycerides were elevated at 232 (apparently this has been persistently elevated), HDL 39 and LDL 86.  She is currently on Lipitor. Her blood pressure has been fairly well-controlled on her current medication regimen. Her main other symptom today he is a progressive increase in shortness of breath and some fatigue as well as constipation. Her thyroid medication appears to be appropriate and her TSH and T4 within normal limits. She was previously on metoprolol tartrate 50 mg twice daily and this was recently reduced down to 25 mg once daily. Her heart rate had ranged in the 30s has come up now into the mid 40s, and her symptoms are somewhat better. I am concerned though that there is sinus node dysfunction.  PMHx:  Past Medical History  Diagnosis Date  . Hypertension   . Hypothyroidism   . Exogenous obesity   . History of total bilateral knee replacement   . CAD  (coronary artery disease)     possible ant wall MI ('97) - cath & CABG x5  . GERD (gastroesophageal reflux disease)   . Squamous cell carcinoma of scalp     removed - Kathryn Carlson  . History of nuclear stress test 09/2012    lexiscan; mild perfusion defect in apical anterior & apical region (infarct/scar) - no significant ischemia, low risk     Past Surgical History  Procedure Laterality Date  . Coronary artery bypass graft  09/1996    cath & CABG x5 LIMA-LAD, SVG-sequential OD & OM, SVG sequential to PDA & PLA (Kathryn Carlson) -   . Appendectomy  1941  . Abdominal hysterectomy  1984  . Tonsillectomy    . Replacement total knee Bilateral 2001 & 2003  . Transthoracic echocardiogram  08/23/2013    EF 50-55%, LV cavity size mod reduced, mild LVH, mild conc hypertrophy; mild AV stenosis & mild regurg; mild MR - ordered for murmur  . Carotid doppler  12/07/2012    R & L ICA - 0-49% diameter reduction    FAMHx:  Family History  Problem Relation Age of Onset  . Heart attack Mother   . Heart attack Father   . Cancer Sister   . Diabetes Sister   . Heart Problems Sister     SOCHx:   reports that she has never smoked. She has never used smokeless tobacco. She reports that she does not drink alcohol. Her drug history is not on file.  ALLERGIES:  Allergies  Allergen Reactions  . Novocain [Procaine]  ROS: A comprehensive review of systems was negative except for: Constitutional: positive for fatigue Respiratory: positive for dyspnea on exertion Cardiovascular: positive for irregular heart beat Gastrointestinal: positive for constipation Endocrine: positive for temperature intolerance  HOME MEDS: Current Outpatient Prescriptions  Medication Sig Dispense Refill  . aspirin 81 MG tablet Take 162 mg by mouth daily.       Marland Kitchen atorvastatin (LIPITOR) 40 MG tablet Take 40 mg by mouth daily.      . bisacodyl (DULCOLAX) 5 MG EC tablet Take 5 mg by mouth daily as needed for constipation.       . chloridazePOXIDE-amitriptyline (LIMBITROL) 5-12.5 MG per tablet Take 1 tablet by mouth daily.      . cholecalciferol (VITAMIN D) 400 UNITS TABS tablet Take 400 Units by mouth daily.      Marland Kitchen estradiol (ESTRACE) 1 MG tablet Take 1 mg by mouth daily.      Marland Kitchen levothyroxine (SYNTHROID, LEVOTHROID) 75 MCG tablet Take 75 mcg by mouth daily before breakfast.      . Multiple Vitamin (MULTIVITAMIN) capsule Take 1 capsule by mouth daily.      . niacin 500 MG tablet Take 500 mg by mouth daily with breakfast.      . pantoprazole (PROTONIX) 40 MG tablet Take 40 mg by mouth daily.      . valsartan (DIOVAN) 80 MG tablet Take 160 mg by mouth daily.       Marland Kitchen VITAMIN E PO Take 400 mg by mouth daily. Plus D      . fenofibrate (TRICOR) 48 MG tablet Take 1 tablet (48 mg total) by mouth daily.  30 tablet  11  . zolpidem (AMBIEN) 5 MG tablet Take 1 tablet (5 mg total) by mouth at bedtime as needed for sleep.  14 tablet  0   No current facility-administered medications for this visit.    LABS/IMAGING: No results found for this or any previous visit (from the past 48 hour(s)). No results found.  VITALS: BP 138/80  Pulse 48  Ht 5\' 5"  (1.651 m)  Wt 203 lb 1.6 oz (92.126 kg)  BMI 33.8 kg/m2  EXAM: General appearance: alert and no distress Neck: no carotid bruit and no JVD Lungs: clear to auscultation bilaterally Heart: regular rate and rhythm, S1, S2 normal, systolic murmur: systolic ejection 2/6, crescendo at 2nd right intercostal space and diastolic murmur: mid diastolic 2/6, blowing at apex Abdomen: soft, non-tender; bowel sounds normal; no masses,  no organomegaly Extremities: extremities normal, atraumatic, no cyanosis or edema Pulses: 2+ and symmetric Skin: Skin color, texture, turgor normal. No rashes or lesions Neurologic: Walks with a cane Psych: Mood, affect normal  EKG: deferred  ASSESSMENT: 1. Coronary artery disease status post 5 vessel CABG in 1997 (with 3 grafts) 2. Recent symptomatic  bradycardia 3. Dyslipidemia-not at goal 4. Obesity  PLAN: 1.   Kathryn Carlson has recently had an increasing shortness of breath and exercise intolerance likely due to symptomatic bradycardia. She is reported heart rates as well as the 30s. She previously tolerated high-dose Lopressor but is now gone down to only 25 mg once daily. She continues to have some bradycardia and I recommended discontinuing that today. I will go ahead and schedule her for a 2 week and can't monitor to look for any further continuing bradycardia, sinus pauses or other arrhythmias. I reviewed her recent lipid profile she shows persistently elevated triglycerides. She reports that she is very few simple sugars although he is overweight. She is nondiabetic, it  does not appear that she will be able to improve this part of her lipid profile and without medical therapy. This is an important secondary target as far as trying to keep her grafts open long term I would recommend low-dose fenofibrate in addition to her Lipitor. We'll go ahead and order that for her today. Plan to see her back in a few weeks after her monitor to see if she's had improvement in her symptoms of symptomatic bradycardia.  Chrystie Nose, MD, [facility] Attending Cardiologist [facility]  Kathryn Carlson C 10/25/2013, 10:31 AM

## 2013-11-07 ENCOUNTER — Telehealth: Payer: Self-pay | Admitting: Internal Medicine

## 2013-11-07 NOTE — Telephone Encounter (Signed)
Message forwarded to Scheduling to contact pt and set up appt for event monitor.

## 2013-11-07 NOTE — Telephone Encounter (Signed)
She has received the monitor that was ordered.  Wants to know when she can come in to get put on.  Please call

## 2013-11-13 ENCOUNTER — Ambulatory Visit (INDEPENDENT_AMBULATORY_CARE_PROVIDER_SITE_OTHER): Payer: Medicare Other | Admitting: *Deleted

## 2013-11-13 DIAGNOSIS — R001 Bradycardia, unspecified: Secondary | ICD-10-CM

## 2013-11-13 DIAGNOSIS — R42 Dizziness and giddiness: Secondary | ICD-10-CM | POA: Diagnosis not present

## 2013-11-13 DIAGNOSIS — I495 Sick sinus syndrome: Secondary | ICD-10-CM | POA: Diagnosis not present

## 2013-11-13 DIAGNOSIS — R002 Palpitations: Secondary | ICD-10-CM

## 2013-11-13 NOTE — Progress Notes (Signed)
Patient presents to the office for placement of her cardiac event monitor. Verbal instructions given. Monitor placed. Patient voiced understanding.

## 2013-11-20 ENCOUNTER — Ambulatory Visit: Payer: Medicare Other | Admitting: Internal Medicine

## 2013-11-27 ENCOUNTER — Telehealth: Payer: Self-pay | Admitting: Internal Medicine

## 2013-11-27 NOTE — Telephone Encounter (Signed)
I spoke with patient.  She cannot find the bag provided by [employer] to mail the monitor back to the company.  I advised patient to call [employer] to provide her with a bag or shipping label.  Patient voiced understanding.

## 2013-12-04 ENCOUNTER — Encounter: Payer: Self-pay | Admitting: Internal Medicine

## 2013-12-04 ENCOUNTER — Ambulatory Visit (INDEPENDENT_AMBULATORY_CARE_PROVIDER_SITE_OTHER): Payer: Medicare Other | Admitting: Internal Medicine

## 2013-12-04 VITALS — BP 146/82 | HR 84 | Ht 66.5 in | Wt 199.8 lb

## 2013-12-04 DIAGNOSIS — E785 Hyperlipidemia, unspecified: Secondary | ICD-10-CM

## 2013-12-04 DIAGNOSIS — I498 Other specified cardiac arrhythmias: Secondary | ICD-10-CM | POA: Diagnosis not present

## 2013-12-04 DIAGNOSIS — I4949 Other premature depolarization: Secondary | ICD-10-CM | POA: Diagnosis not present

## 2013-12-04 DIAGNOSIS — I2510 Atherosclerotic heart disease of native coronary artery without angina pectoris: Secondary | ICD-10-CM | POA: Diagnosis not present

## 2013-12-04 DIAGNOSIS — R001 Bradycardia, unspecified: Secondary | ICD-10-CM

## 2013-12-04 DIAGNOSIS — Z951 Presence of aortocoronary bypass graft: Secondary | ICD-10-CM

## 2013-12-04 DIAGNOSIS — I4729 Other ventricular tachycardia: Secondary | ICD-10-CM | POA: Diagnosis not present

## 2013-12-04 DIAGNOSIS — I10 Essential (primary) hypertension: Secondary | ICD-10-CM

## 2013-12-04 DIAGNOSIS — I472 Ventricular tachycardia: Secondary | ICD-10-CM | POA: Insufficient documentation

## 2013-12-04 DIAGNOSIS — I493 Ventricular premature depolarization: Secondary | ICD-10-CM

## 2013-12-04 NOTE — Patient Instructions (Signed)
You have been referred to Dr. Johney Frame (on [address]) - their office will contact you.  Your physician wants you to follow-up in: 6 months with [name]. You will receive a reminder letter in the mail two months in advance. If you don't receive a letter, please call our office to schedule the follow-up appointment.

## 2013-12-04 NOTE — Progress Notes (Signed)
OFFICE NOTE  Chief Complaint:  Routine follow-up, former Kathryn Carlson patient  Primary Care Physician: Kathryn Meo, MD  HPI:  Kathryn Carlson is a very pleasant 79 year old female presents to follow by Kathryn Carlson with history of coronary artery disease in a first anterior MI in 1997, she underwent three-vessel bypass with 2 grafts that were skipped grafts to 5 separate vessels. This was in 1997 by Dr. Donata Carlson. Overall she's done well since then with appropriate management of her dyslipidemia, hypertension hypothyroidism and acid reflux. She wore monitor in September of 2013 Kathryn Carlson a 6 beat run of NSVT has had PVCs and bigeminy at times since then. An echocardiogram was performed in August of 2014 which showed an EF of 50-55%, apparently normal diastolic function (however this is almost impossible), mild aortic insufficiency and mild aortic stenosis, mild mitral regurgitation, and left atrial enlargement.  She recently saw Dr. Jacky Carlson for a full physical. Her lipid profile was reperformed which showed total Crestor 171, triglycerides were elevated at 232 (apparently this has been persistently elevated), HDL 39 and LDL 86.  She is currently on Lipitor. Her blood pressure has been fairly well-controlled on her current medication regimen. Her main other symptom today he is a progressive increase in shortness of breath and some fatigue as well as constipation. Her thyroid medication appears to be appropriate and her TSH and T4 within normal limits. She was previously on metoprolol tartrate 50 mg twice daily and this was recently reduced down to 25 mg once daily. Her heart rate had ranged in the 30s has come up now into the mid 40s, and her symptoms are somewhat better. I am concerned though that there is sinus node dysfunction.  At her last visit I recommended stopping metoprolol. She reports that she's had a marked improvement in her symptoms since that time. Her energy level is better and  she is not is easily fatigued. She wore a [employer] monitor between 11/13/2013 11/26/2013. This demonstrated a minimum heart rate of 63. However there were numerous PVCs, couplets and runs of nonsustained VT, no greater than 5 beats. She is reportedly aware of some palpitations but not others.  PMHx:  Past Medical History  Diagnosis Date  . Hypertension   . Hypothyroidism   . Exogenous obesity   . History of total bilateral knee replacement   . CAD (coronary artery disease)     possible ant wall MI ('97) - cath & CABG x5  . GERD (gastroesophageal reflux disease)   . Squamous cell carcinoma of scalp     removed - Dr. Jorja Carlson  . History of nuclear stress test 09/2012    lexiscan; mild perfusion defect in apical anterior & apical region (infarct/scar) - no significant ischemia, low risk     Past Surgical History  Procedure Laterality Date  . Coronary artery bypass graft  09/1996    cath & CABG x5 LIMA-LAD, SVG-sequential OD & OM, SVG sequential to PDA & PLA (Dr. Donata Carlson) -   . Appendectomy  1941  . Abdominal hysterectomy  1984  . Tonsillectomy    . Replacement total knee Bilateral 2001 & 2003  . Transthoracic echocardiogram  08/23/2013    EF 50-55%, LV cavity size mod reduced, mild LVH, mild conc hypertrophy; mild AV stenosis & mild regurg; mild MR - ordered for murmur  . Carotid doppler  12/07/2012    R & L ICA - 0-49% diameter reduction    FAMHx:  Family History  Problem Relation  Age of Onset  . Heart attack Mother   . Heart attack Father   . Cancer Sister   . Diabetes Sister   . Heart Problems Sister     SOCHx:   reports that she has never smoked. She has never used smokeless tobacco. She reports that she does not drink alcohol. Her drug history is not on file.  ALLERGIES:  Allergies  Allergen Reactions  . Novocain [Procaine]     ROS: A comprehensive review of systems was negative except for: Constitutional: positive for fatigue Respiratory: positive for dyspnea  on exertion Cardiovascular: positive for irregular heart beat Gastrointestinal: positive for constipation Endocrine: positive for temperature intolerance  HOME MEDS: Current Outpatient Prescriptions  Medication Sig Dispense Refill  . aspirin 81 MG tablet Take 162 mg by mouth daily.       Marland Kitchen atorvastatin (LIPITOR) 40 MG tablet Take 40 mg by mouth daily.      . bisacodyl (DULCOLAX) 5 MG EC tablet Take 5 mg by mouth daily as needed for constipation.      . chloridazePOXIDE-amitriptyline (LIMBITROL) 5-12.5 MG per tablet Take 1 tablet by mouth daily.      . cholecalciferol (VITAMIN D) 400 UNITS TABS tablet Take 400 Units by mouth daily.      Marland Kitchen estradiol (ESTRACE) 1 MG tablet Take 1 mg by mouth daily.      . fenofibrate (TRICOR) 48 MG tablet Take 1 tablet (48 mg total) by mouth daily.  90 tablet  3  . levothyroxine (SYNTHROID, LEVOTHROID) 75 MCG tablet Take 75 mcg by mouth daily before breakfast.      . Multiple Vitamin (MULTIVITAMIN) capsule Take 1 capsule by mouth daily.      . niacin 500 MG tablet Take 500 mg by mouth daily with breakfast.      . pantoprazole (PROTONIX) 40 MG tablet Take 40 mg by mouth daily.      . valsartan (DIOVAN) 80 MG tablet Take 80 mg by mouth daily.       Marland Kitchen VITAMIN E PO Take 400 mg by mouth daily. Plus D       No current facility-administered medications for this visit.    LABS/IMAGING: No results found for this or any previous visit (from the past 48 hour(s)). No results found.  VITALS: BP 146/82  Pulse 84  Ht 5' 6.5" (1.689 m)  Wt 199 lb 12.8 oz (90.629 kg)  BMI 31.77 kg/m2  EXAM: deferred  EKG: deferred  ASSESSMENT: 1. Coronary artery disease status post 5 vessel CABG in 1997 (with 3 grafts) 2. Recent symptomatic bradycardia 3. Dyslipidemia-not at goal 4. Obesity 5. Frequent PVCs and nonsustained VT  PLAN: 1.   [name] has recently had an increasing shortness of breath and exercise intolerance likely due to symptomatic bradycardia. She  is reported heart rates as well as the 30s. She now notes an improvement in her symptoms off of metoprolol, but she does appear to be having more PVCs and episodes of nonsustained VT. I suspect this could be scar mediated, as her nuclear stress test last year showed a small area of anteroapical infarct. EF is well-preserved at 50-55% by echocardiogram this year.  The main question at this point as to whether or not she will need beta blocker in the future to suppress her PVCs, or whether that will contribute to her symptomatic bradycardia. One management strategy could be a pacemaker and reinstitution of beta blocker. Another option may be antiarrhythmic therapy to suppress the PVCs.  In addition, we could possibly consider just keeping her off of the beta blocker, but I'm concerned about recurrent nonsustained VT. Finally, a last option may be VT ablation. Since there are many options, I would like her to discuss them further with a cardiac electrophysiologist. I recommended a referral to Dr. Hillis Range.  Plan to see her back in 6 months or sooner if he feels is necessary.  Chrystie Nose, MD, [facility] Attending Cardiologist [facility]  HILTY,Kenneth C 12/04/2013, 11:32 AM

## 2013-12-05 ENCOUNTER — Other Ambulatory Visit: Payer: Self-pay | Admitting: Internal Medicine

## 2013-12-05 NOTE — Telephone Encounter (Signed)
Rx was sent to pharmacy electronically. 

## 2013-12-23 DIAGNOSIS — B372 Candidiasis of skin and nail: Secondary | ICD-10-CM | POA: Diagnosis not present

## 2014-01-08 ENCOUNTER — Ambulatory Visit (INDEPENDENT_AMBULATORY_CARE_PROVIDER_SITE_OTHER): Payer: Medicare Other | Admitting: Internal Medicine

## 2014-01-08 ENCOUNTER — Other Ambulatory Visit: Payer: Self-pay | Admitting: *Deleted

## 2014-01-08 ENCOUNTER — Encounter: Payer: Self-pay | Admitting: Internal Medicine

## 2014-01-08 VITALS — BP 132/86 | HR 105 | Ht 66.5 in | Wt 201.0 lb

## 2014-01-08 DIAGNOSIS — Z951 Presence of aortocoronary bypass graft: Secondary | ICD-10-CM | POA: Diagnosis not present

## 2014-01-08 DIAGNOSIS — I10 Essential (primary) hypertension: Secondary | ICD-10-CM

## 2014-01-08 DIAGNOSIS — I472 Ventricular tachycardia: Secondary | ICD-10-CM | POA: Diagnosis not present

## 2014-01-08 DIAGNOSIS — I4729 Other ventricular tachycardia: Secondary | ICD-10-CM

## 2014-01-08 DIAGNOSIS — R001 Bradycardia, unspecified: Secondary | ICD-10-CM

## 2014-01-08 DIAGNOSIS — I498 Other specified cardiac arrhythmias: Secondary | ICD-10-CM

## 2014-01-08 DIAGNOSIS — I2510 Atherosclerotic heart disease of native coronary artery without angina pectoris: Secondary | ICD-10-CM

## 2014-01-08 DIAGNOSIS — I4949 Other premature depolarization: Secondary | ICD-10-CM

## 2014-01-08 DIAGNOSIS — I493 Ventricular premature depolarization: Secondary | ICD-10-CM

## 2014-01-08 MED ORDER — NADOLOL 20 MG PO TABS: 20.0000 mg | ORAL_TABLET | Freq: Every day | ORAL | Status: DC

## 2014-01-08 MED ORDER — FENOFIBRATE 48 MG PO TABS: 48.0000 mg | ORAL_TABLET | Freq: Every day | ORAL | Status: DC

## 2014-01-08 NOTE — Progress Notes (Signed)
Primary Care Physician: Geoffery Lyons, MD Referring Physician:  Dr Debara Pickett (previously Rollene Fare)  Kathryn Carlson is a 80 y.o. female with a h/o HTN and CAD who presents for EP evaluation of PVCs and NSVT.  She has a remote history of CABG.  She has done reasonably well recently.  She has a h/o PVCs for which she has been treated previously with beta blockers.  Due to bradycardia and fatigue, her metoprolol was discontinued.  She says that her fatigue has actually worsened off of beta blockers.    Today, she denies symptoms of palpitations, chest pain, shortness of breath, orthopnea, PND, lower extremity edema, dizziness, presyncope, syncope, or neurologic sequela. The patient is tolerating medications without difficulties and is otherwise without complaint today.   Past Medical History  Diagnosis Date  . Hypertension   . Hypothyroidism   . Exogenous obesity   . History of total bilateral knee replacement   . CAD (coronary artery disease)     possible ant wall MI ('97) - cath & CABG x5  . GERD (gastroesophageal reflux disease)   . Squamous cell carcinoma of scalp     removed - Dr. Denna Haggard  . History of nuclear stress test 09/2012    lexiscan; mild perfusion defect in apical anterior & apical region (infarct/scar) - no significant ischemia, low risk   . PVC's (premature ventricular contractions)    Past Surgical History  Procedure Laterality Date  . Coronary artery bypass graft  09/1996    cath & CABG x5 LIMA-LAD, SVG-sequential OD & OM, SVG sequential to PDA & PLA (Dr. Prescott Gum)  . Appendectomy  1941  . Abdominal hysterectomy  1984  . Tonsillectomy    . Replacement total knee Bilateral 2001 & 2003  . Transthoracic echocardiogram  08/23/2013    EF 50-55%, LV cavity size mod reduced, mild LVH, mild conc hypertrophy; mild AV stenosis & mild regurg; mild MR - ordered for murmur  . Carotid doppler  12/07/2012    R & L ICA - 0-49% diameter reduction    Current Outpatient  Prescriptions  Medication Sig Dispense Refill  . aspirin 81 MG tablet Take 162 mg by mouth daily.       Marland Kitchen atorvastatin (LIPITOR) 40 MG tablet Take 40 mg by mouth daily.      . bisacodyl (DULCOLAX) 5 MG EC tablet Take 5 mg by mouth daily as needed for constipation.      . chloridazePOXIDE-amitriptyline (LIMBITROL) 5-12.5 MG per tablet Take 1 tablet by mouth daily.      . cholecalciferol (VITAMIN D) 1000 UNITS tablet Take 2,000 Units by mouth daily.      Marland Kitchen estradiol (ESTRACE) 1 MG tablet Take 1 mg by mouth daily.      Marland Kitchen levothyroxine (SYNTHROID, LEVOTHROID) 75 MCG tablet Take 75 mcg by mouth daily before breakfast.      . Multiple Vitamin (MULTIVITAMIN) capsule Take 1 capsule by mouth daily.      . niacin 500 MG tablet Take 500 mg by mouth daily with breakfast.      . pantoprazole (PROTONIX) 40 MG tablet TAKE 1 TABLET BY MOUTH EVERY DAY  90 tablet  3  . valsartan (DIOVAN) 80 MG tablet Take 160 mg by mouth daily.       Marland Kitchen VITAMIN E PO Take 400 mg by mouth daily. Plus D      . fenofibrate (TRICOR) 48 MG tablet Take 1 tablet (48 mg total) by mouth daily.  90 tablet  3  .  nadolol (CORGARD) 20 MG tablet Take 1 tablet (20 mg total) by mouth daily.  90 tablet  3   No current facility-administered medications for this visit.    Allergies  Allergen Reactions  . Novocain [Procaine]     History   Social History  . Marital Status: Widowed    Spouse Name: N/A    Number of Children: 4  . Years of Education: 13   Occupational History  . Not on file.   Social History Main Topics  . Smoking status: Never Smoker   . Smokeless tobacco: Never Used  . Alcohol Use: No  . Drug Use: Not on file  . Sexual Activity: Not on file   Other Topics Concern  . Not on file   Social History Narrative   Lives in [place]    Family History  Problem Relation Age of Onset  . Heart attack Mother   . Heart attack Father   . Cancer Sister   . Diabetes Sister   . Heart Problems Sister     ROS- All  systems are reviewed and negative except as per the HPI above  Physical Exam: Filed Vitals:   01/08/14 0847  BP: 132/86  Pulse: 105  Height: 5' 6.5" (1.689 m)  Weight: 201 lb (91.173 kg)    GEN- The patient is elderly appearing, alert and oriented x 3 today.   Head- normocephalic, atraumatic Eyes-  Sclera clear, conjunctiva pink Ears- hearing intact Oropharynx- clear Neck- supple, no JVP Lymph- no cervical lymphadenopathy Lungs- Clear to ausculation bilaterally, normal work of breathing Heart- Regular rate and rhythm, no murmurs, rubs or gallops, PMI not laterally displaced GI- soft, NT, ND, + BS Extremities- no clubbing, cyanosis, or edema MS- no significant deformity or atrophy Skin- no rash or lesion Psych- euthymic mood, full affect Neuro- strength and sensation are intact  EKG today reveals sinus rhythm with PVCs in bigeminy.  The PVC morphology is RBBB/LAHB, nonspecific ST/T changes Echo 8/14 is reviewed [facility] 11/14 is reviewed [facility] and Dr Lysbeth Penner notes are reviewed  Assessment and Plan:  1. PVCS The patients PVCs appear to arise from the posterior papillary muscle of the LV.  These are benign but are accompanied by symptoms.  Therapeutic strategies for her PVCs including medicine and ablation were discussed in detail with the patient today.  Given her advanced age and relative low symptoms, she is not interested in ablation.  She has had some bradycardia with beta blockers previously but actually feels worse off of them.  Typically I would recommend that we avoid PPM for PVCs as pacing is not typically an effective therapy for PVCs. I will start nadolol 20mg  daily today.  She will return in 6 weeks for follow-up.  2. HTN Stable No change required today  3. CAD Stable No change required today  Return in 6 weeks Follow-up with Dr Debara Pickett as scheduled

## 2014-01-08 NOTE — Patient Instructions (Signed)
Your physician recommends that you schedule a follow-up appointment in: 6 weeks with Dr Rayann Heman  Your physician has recommended you make the following change in your medication:  1) Start Nadolol 20mg  daily

## 2014-01-14 ENCOUNTER — Encounter: Payer: Self-pay | Admitting: Internal Medicine

## 2014-01-22 ENCOUNTER — Telehealth: Payer: Self-pay | Admitting: Internal Medicine

## 2014-01-22 NOTE — Telephone Encounter (Signed)
C/o feeling tired.  She went to have the RN at [facility] to check her BP it was L 180/80 and R 178/82.  She is going to start the Nadolol and the Diovan in the morning and have the nurse re-check her BP in about a week

## 2014-01-22 NOTE — Telephone Encounter (Signed)
New message         Pt has not started on the new medication that has been prescribed by dr allred on jan 12. C/o bp being high and pt wants to know if this medication will decrease bp. (Nadolol)

## 2014-02-21 ENCOUNTER — Ambulatory Visit: Payer: Medicare Other | Admitting: Internal Medicine

## 2014-02-26 DIAGNOSIS — H61009 Unspecified perichondritis of external ear, unspecified ear: Secondary | ICD-10-CM | POA: Diagnosis not present

## 2014-02-26 DIAGNOSIS — L821 Other seborrheic keratosis: Secondary | ICD-10-CM | POA: Diagnosis not present

## 2014-03-07 DIAGNOSIS — H4050X0 Glaucoma secondary to other eye disorders, unspecified eye, stage unspecified: Secondary | ICD-10-CM | POA: Diagnosis not present

## 2014-03-07 DIAGNOSIS — Z961 Presence of intraocular lens: Secondary | ICD-10-CM | POA: Diagnosis not present

## 2014-03-07 DIAGNOSIS — H52209 Unspecified astigmatism, unspecified eye: Secondary | ICD-10-CM | POA: Diagnosis not present

## 2014-03-07 DIAGNOSIS — H2640 Unspecified secondary cataract: Secondary | ICD-10-CM | POA: Diagnosis not present

## 2014-03-26 ENCOUNTER — Ambulatory Visit (INDEPENDENT_AMBULATORY_CARE_PROVIDER_SITE_OTHER): Payer: Medicare Other | Admitting: Internal Medicine

## 2014-03-26 ENCOUNTER — Encounter: Payer: Self-pay | Admitting: Internal Medicine

## 2014-03-26 VITALS — BP 158/80 | HR 72 | Ht 65.0 in | Wt 195.0 lb

## 2014-03-26 DIAGNOSIS — I2510 Atherosclerotic heart disease of native coronary artery without angina pectoris: Secondary | ICD-10-CM | POA: Diagnosis not present

## 2014-03-26 DIAGNOSIS — I4949 Other premature depolarization: Secondary | ICD-10-CM | POA: Diagnosis not present

## 2014-03-26 DIAGNOSIS — I10 Essential (primary) hypertension: Secondary | ICD-10-CM | POA: Diagnosis not present

## 2014-03-26 DIAGNOSIS — Z951 Presence of aortocoronary bypass graft: Secondary | ICD-10-CM

## 2014-03-26 DIAGNOSIS — I493 Ventricular premature depolarization: Secondary | ICD-10-CM

## 2014-03-26 NOTE — Patient Instructions (Signed)
Your physician recommends that you schedule a follow-up appointment in: 2 months with Dr Debara Pickett and as needed with Dr Rayann Heman

## 2014-03-28 NOTE — Progress Notes (Signed)
PCP: Geoffery Lyons, MD Primary Cardiologist:  Dr Clayborn Heron Kathryn Carlson is a 80 y.o. female who presents today for routine electrophysiology followup.  Since last being seen in our clinic, the patient reports doing reasonably well.  She has occasional fatigue but is otherwise doing well.  Today, she denies symptoms of palpitations, chest pain, shortness of breath,  lower extremity edema, dizziness, presyncope, or syncope.  The patient is otherwise without complaint today.   Past Medical History  Diagnosis Date  . Hypertension   . Hypothyroidism   . Exogenous obesity   . History of total bilateral knee replacement   . CAD (coronary artery disease)     possible ant wall MI ('97) - cath & CABG x5  . GERD (gastroesophageal reflux disease)   . Squamous cell carcinoma of scalp     removed - Dr. Denna Haggard  . History of nuclear stress test 09/2012    lexiscan; mild perfusion defect in apical anterior & apical region (infarct/scar) - no significant ischemia, low risk   . PVC's (premature ventricular contractions)    Past Surgical History  Procedure Laterality Date  . Coronary artery bypass graft  09/1996    cath & CABG x5 LIMA-LAD, SVG-sequential OD & OM, SVG sequential to PDA & PLA (Dr. Prescott Gum)  . Appendectomy  1941  . Abdominal hysterectomy  1984  . Tonsillectomy    . Replacement total knee Bilateral 2001 & 2003  . Transthoracic echocardiogram  08/23/2013    EF 50-55%, LV cavity size mod reduced, mild LVH, mild conc hypertrophy; mild AV stenosis & mild regurg; mild MR - ordered for murmur  . Carotid doppler  12/07/2012    R & L ICA - 0-49% diameter reduction    Current Outpatient Prescriptions  Medication Sig Dispense Refill  . aspirin 81 MG tablet Take 162 mg by mouth daily.       Marland Kitchen atorvastatin (LIPITOR) 40 MG tablet Take 40 mg by mouth daily.      . bisacodyl (DULCOLAX) 5 MG EC tablet Take 5 mg by mouth daily as needed for constipation.      . chloridazePOXIDE-amitriptyline  (LIMBITROL) 5-12.5 MG per tablet Take 1 tablet by mouth daily.      . cholecalciferol (VITAMIN D) 1000 UNITS tablet Take 2,000 Units by mouth daily.      Marland Kitchen estradiol (ESTRACE) 1 MG tablet Take 1 mg by mouth daily.      . fenofibrate (TRICOR) 48 MG tablet Take 1 tablet (48 mg total) by mouth daily.  90 tablet  3  . levothyroxine (SYNTHROID, LEVOTHROID) 75 MCG tablet Take 75 mcg by mouth daily before breakfast.      . Multiple Vitamin (MULTIVITAMIN) capsule Take 1 capsule by mouth daily.      . nadolol (CORGARD) 20 MG tablet Take 1 tablet (20 mg total) by mouth daily.  90 tablet  3  . niacin 500 MG tablet Take 500 mg by mouth daily with breakfast.      . pantoprazole (PROTONIX) 40 MG tablet TAKE 1 TABLET BY MOUTH EVERY DAY  90 tablet  3  . valsartan (DIOVAN) 80 MG tablet Take 160 mg by mouth daily.       Marland Kitchen VITAMIN E PO Take 400 mg by mouth daily. Plus D       No current facility-administered medications for this visit.    Physical Exam: Filed Vitals:   03/26/14 1619  BP: 158/80  Pulse: 72  Height: 5\' 5"  (1.651  m)  Weight: 195 lb (88.451 kg)    GEN- The patient is well appearing, alert and oriented x 3 today.   Head- normocephalic, atraumatic Eyes-  Sclera clear, conjunctiva pink Ears- hearing intact Oropharynx- clear Lungs- Clear to ausculation bilaterally, normal work of breathing Heart- Regular rate and rhythm, no murmurs, rubs or gallops, PMI not laterally displaced GI- soft, NT, ND, + BS Extremities- no clubbing, cyanosis, or edema  ekg today reveals sinus rhythm 70 bpm, otherwise normal ekg  Assessment and Plan:  1. PVCs Improved with nadolol.  Her fatigue preceeded nadolol (not due to the medicine) and did not improve with reduction in PVC burden.  I would therefore conclude that her fatigue is less likely related to arrhythmia. No further workup planned Given her advanced age, I would not recommend any EP procedures at this time  2. HTN Stable No change required  today  3. CAD Stable No change required today  Follow-up with Dr Debara Pickett as scheuled I will see as needed going forward

## 2014-04-02 ENCOUNTER — Other Ambulatory Visit: Payer: Self-pay | Admitting: Dermatology

## 2014-04-02 DIAGNOSIS — L820 Inflamed seborrheic keratosis: Secondary | ICD-10-CM | POA: Diagnosis not present

## 2014-04-02 DIAGNOSIS — D485 Neoplasm of uncertain behavior of skin: Secondary | ICD-10-CM | POA: Diagnosis not present

## 2014-04-12 DIAGNOSIS — R5383 Other fatigue: Secondary | ICD-10-CM | POA: Diagnosis not present

## 2014-04-12 DIAGNOSIS — R5381 Other malaise: Secondary | ICD-10-CM | POA: Diagnosis not present

## 2014-04-19 DIAGNOSIS — I359 Nonrheumatic aortic valve disorder, unspecified: Secondary | ICD-10-CM | POA: Diagnosis not present

## 2014-04-19 DIAGNOSIS — I6529 Occlusion and stenosis of unspecified carotid artery: Secondary | ICD-10-CM | POA: Diagnosis not present

## 2014-04-19 DIAGNOSIS — D638 Anemia in other chronic diseases classified elsewhere: Secondary | ICD-10-CM | POA: Diagnosis not present

## 2014-04-19 DIAGNOSIS — I25810 Atherosclerosis of coronary artery bypass graft(s) without angina pectoris: Secondary | ICD-10-CM | POA: Diagnosis not present

## 2014-04-19 DIAGNOSIS — I10 Essential (primary) hypertension: Secondary | ICD-10-CM | POA: Diagnosis not present

## 2014-04-19 DIAGNOSIS — F3289 Other specified depressive episodes: Secondary | ICD-10-CM | POA: Diagnosis not present

## 2014-04-19 DIAGNOSIS — K219 Gastro-esophageal reflux disease without esophagitis: Secondary | ICD-10-CM | POA: Diagnosis not present

## 2014-04-19 DIAGNOSIS — Z1331 Encounter for screening for depression: Secondary | ICD-10-CM | POA: Diagnosis not present

## 2014-04-19 DIAGNOSIS — F329 Major depressive disorder, single episode, unspecified: Secondary | ICD-10-CM | POA: Diagnosis not present

## 2014-06-01 ENCOUNTER — Other Ambulatory Visit (HOSPITAL_COMMUNITY): Payer: Self-pay | Admitting: Internal Medicine

## 2014-06-01 DIAGNOSIS — Z1231 Encounter for screening mammogram for malignant neoplasm of breast: Secondary | ICD-10-CM

## 2014-06-04 ENCOUNTER — Ambulatory Visit (INDEPENDENT_AMBULATORY_CARE_PROVIDER_SITE_OTHER): Payer: Medicare Other | Admitting: Internal Medicine

## 2014-06-04 ENCOUNTER — Encounter: Payer: Self-pay | Admitting: Internal Medicine

## 2014-06-04 VITALS — BP 130/82 | HR 94 | Ht 65.5 in | Wt 193.9 lb

## 2014-06-04 DIAGNOSIS — I10 Essential (primary) hypertension: Secondary | ICD-10-CM | POA: Diagnosis not present

## 2014-06-04 DIAGNOSIS — I498 Other specified cardiac arrhythmias: Secondary | ICD-10-CM | POA: Diagnosis not present

## 2014-06-04 DIAGNOSIS — Z951 Presence of aortocoronary bypass graft: Secondary | ICD-10-CM

## 2014-06-04 DIAGNOSIS — I2510 Atherosclerotic heart disease of native coronary artery without angina pectoris: Secondary | ICD-10-CM

## 2014-06-04 DIAGNOSIS — I493 Ventricular premature depolarization: Secondary | ICD-10-CM

## 2014-06-04 DIAGNOSIS — I4729 Other ventricular tachycardia: Secondary | ICD-10-CM | POA: Diagnosis not present

## 2014-06-04 DIAGNOSIS — I472 Ventricular tachycardia: Secondary | ICD-10-CM

## 2014-06-04 DIAGNOSIS — I4949 Other premature depolarization: Secondary | ICD-10-CM

## 2014-06-04 DIAGNOSIS — I499 Cardiac arrhythmia, unspecified: Secondary | ICD-10-CM

## 2014-06-04 NOTE — Patient Instructions (Signed)
Your physician wants you to follow-up in:  6 months. You will receive a reminder letter in the mail two months in advance. If you don't receive a letter, please call our office to schedule the follow-up appointment.   

## 2014-06-04 NOTE — Progress Notes (Signed)
OFFICE NOTE  Chief Complaint:  Routine follow-up, former Rollene Fare patient  Primary Care Physician: Geoffery Lyons, MD  HPI:  Kathryn Carlson is a very pleasant 80 year old female presents to follow by Dr. Rollene Fare with history of coronary artery disease in a first anterior MI in 1997, she underwent three-vessel bypass with 2 grafts that were skipped grafts to 5 separate vessels. This was in 1997 by Dr. Prescott Gum. Overall she's done well since then with appropriate management of her dyslipidemia, hypertension hypothyroidism and acid reflux. She wore monitor in September of 2013 Richard a 6 beat run of NSVT has had PVCs and bigeminy at times since then. An echocardiogram was performed in August of 2014 which showed an EF of 50-55%, apparently normal diastolic function (however this is almost impossible), mild aortic insufficiency and mild aortic stenosis, mild mitral regurgitation, and left atrial enlargement.  She recently saw Dr. Reynaldo Minium for a full physical. Her lipid profile was reperformed which showed total Crestor 171, triglycerides were elevated at 232 (apparently this has been persistently elevated), HDL 39 and LDL 86.  She is currently on Lipitor. Her blood pressure has been fairly well-controlled on her current medication regimen. Her main other symptom today he is a progressive increase in shortness of breath and some fatigue as well as constipation. Her thyroid medication appears to be appropriate and her TSH and T4 within normal limits. She was previously on metoprolol tartrate 50 mg twice daily and this was recently reduced down to 25 mg once daily. Her heart rate had ranged in the 30s has come up now into the mid 40s, and her symptoms are somewhat better. I am concerned though that there is sinus node dysfunction.  At her last visit I recommended stopping metoprolol. She reports that she's had a marked improvement in her symptoms since that time. Her energy level is better and  she is not is easily fatigued. She wore a CardioNet monitor between 11/13/2013 11/26/2013. This demonstrated a minimum heart rate of 63. However there were numerous PVCs, couplets and runs of nonsustained VT, no greater than 5 beats. She is reportedly aware of some palpitations but not others.  I had referred her to see Dr. Thompson Grayer in cardiac electrophysiology for evaluation of her frequent PVCs and nonsustained VT. He felt that this was coming from the posterior papillary muscle, possibly related to the anteroapical infarct she suffered in the past. There is clearly scar in this distribution on her nuclear stress test. He recommended that she start on low-dose nadolol and followup appointment with him she reported improvement in her symptoms. Recently, however she was seen by Dr. Reynaldo Minium and was found to have bradycardia and hypotension with heart rates in the 30s. Her nadolol was stopped and her symptoms improved. Today she is again in ventricular bigeminy, her PVCs are conducted and therefore her pulse rate is in the 90s.  PMHx:  Past Medical History  Diagnosis Date  . Hypertension   . Hypothyroidism   . Exogenous obesity   . History of total bilateral knee replacement   . CAD (coronary artery disease)     possible ant wall MI ('97) - cath & CABG x5  . GERD (gastroesophageal reflux disease)   . Squamous cell carcinoma of scalp     removed - Dr. Denna Haggard  . History of nuclear stress test 09/2012    lexiscan; mild perfusion defect in apical anterior & apical region (infarct/scar) - no significant ischemia, low risk   .  PVC's (premature ventricular contractions)     Past Surgical History  Procedure Laterality Date  . Coronary artery bypass graft  09/1996    cath & CABG x5 LIMA-LAD, SVG-sequential OD & OM, SVG sequential to PDA & PLA (Dr. Prescott Gum) -   . Appendectomy  1941  . Abdominal hysterectomy  1984  . Tonsillectomy    . Replacement total knee Bilateral 2001 & 2003  . Transthoracic  echocardiogram  08/23/2013    EF 50-55%, LV cavity size mod reduced, mild LVH, mild conc hypertrophy; mild AV stenosis & mild regurg; mild MR - ordered for murmur  . Carotid doppler  12/07/2012    R & L ICA - 0-49% diameter reduction    FAMHx:  Family History  Problem Relation Age of Onset  . Heart attack Mother   . Heart attack Father   . Cancer Sister   . Diabetes Sister   . Heart Problems Sister     SOCHx:   reports that she has never smoked. She has never used smokeless tobacco. She reports that she drinks alcohol. Her drug history is not on file.  ALLERGIES:  Allergies  Allergen Reactions  . Novocain [Procaine]     ROS: A comprehensive review of systems was negative except for: Constitutional: positive for fatigue Respiratory: positive for dyspnea on exertion Cardiovascular: positive for irregular heart beat Gastrointestinal: positive for constipation Endocrine: positive for temperature intolerance  HOME MEDS: Current Outpatient Prescriptions  Medication Sig Dispense Refill  . aspirin 81 MG tablet Take 162 mg by mouth daily.       Marland Kitchen atorvastatin (LIPITOR) 40 MG tablet Take 40 mg by mouth daily.      . bisacodyl (DULCOLAX) 5 MG EC tablet Take 5 mg by mouth daily as needed for constipation.      . chloridazePOXIDE-amitriptyline (LIMBITROL) 5-12.5 MG per tablet Take 1 tablet by mouth daily.      . cholecalciferol (VITAMIN D) 1000 UNITS tablet Take 2,000 Units by mouth daily.      Marland Kitchen estradiol (ESTRACE) 1 MG tablet Take 1 mg by mouth daily.      . ISTALOL 0.5 % SOLN Place 1 drop into the right eye daily.      Marland Kitchen levothyroxine (SYNTHROID, LEVOTHROID) 75 MCG tablet Take 75 mcg by mouth daily before breakfast.      . Multiple Vitamin (MULTIVITAMIN) capsule Take 1 capsule by mouth daily.      . niacin 500 MG tablet Take 500 mg by mouth daily with breakfast.      . pantoprazole (PROTONIX) 40 MG tablet TAKE 1 TABLET BY MOUTH EVERY DAY  90 tablet  3  . valsartan (DIOVAN) 80 MG  tablet Take 160 mg by mouth daily.       Marland Kitchen VITAMIN E PO Take 400 mg by mouth daily. Plus D       No current facility-administered medications for this visit.    LABS/IMAGING: No results found for this or any previous visit (from the past 48 hour(s)). No results found.  VITALS: BP 130/82  Pulse 94  Ht 5' 5.5" (1.664 m)  Wt 193 lb 14.4 oz (87.952 kg)  BMI 31.76 kg/m2  EXAM: Gen.: Awake, in no apparent distress HEENT: PERRLA, EOMI Lungs: Clear bilaterally Cardiovascular: Regular rate and rhythm normal S1-S2 Abdomen: Soft, nontender Extremities: Trace lower Jevity swelling Neurologic: Awake and oriented Psychiatric: Normal  EKG: Sinus rhythm with frequent PVCs in a bigeminal pattern at 94  ASSESSMENT: 1. Coronary artery disease  status post 5 vessel CABG in 1997 (with 3 grafts) 2. Recent symptomatic bradycardia 3. Dyslipidemia-not at goal 4. Obesity 5. Frequent PVCs and nonsustained VT  PLAN: 1.   Mrs. Polinsky has had recurrent PVCs and ventricular bigeminy which was suppressed by beta blockers however she feels much worse on those medications. She recently stopped the nadolol that she was taking, as was prescribed by Dr. Rayann Heman. This was due to bradycardia in the 30s and hypotension. She is now normotensive with a heart rate in the 90s. She seems to be asymptomatic with regard to her frequent PVCs. I suspect this could be scar mediated. She is not interested in VT ablation. I would recommend keeping her off of beta blockers for the time being and continuing to monitor her. She is not having anginal symptoms. Plan to see her back in 6 months or sooner as necessary.  Pixie Casino, MD, [facility] Attending Cardiologist [place] 06/04/2014, 3:50 PM

## 2014-06-26 DIAGNOSIS — L299 Pruritus, unspecified: Secondary | ICD-10-CM | POA: Diagnosis not present

## 2014-07-04 ENCOUNTER — Ambulatory Visit (HOSPITAL_COMMUNITY)
Admission: RE | Admit: 2014-07-04 | Discharge: 2014-07-04 | Disposition: A | Discharge status: Home / Self Care | Payer: Medicare Other | Source: Ambulatory Visit | Attending: Internal Medicine | Admitting: Internal Medicine

## 2014-07-04 DIAGNOSIS — Z1231 Encounter for screening mammogram for malignant neoplasm of breast: Secondary | ICD-10-CM | POA: Diagnosis not present

## 2014-07-20 ENCOUNTER — Telehealth: Payer: Self-pay | Admitting: Internal Medicine

## 2014-07-20 NOTE — Telephone Encounter (Signed)
Kathryn Carlson is calling about her irregular heartbeat and wants to know does it have any long term effects .Marland Kitchen Please call

## 2014-07-20 NOTE — Telephone Encounter (Signed)
lmom 

## 2014-07-24 NOTE — Telephone Encounter (Signed)
Left message to see patient needs any assistance

## 2014-07-25 ENCOUNTER — Telehealth: Payer: Self-pay | Admitting: Cardiology

## 2014-07-25 NOTE — Telephone Encounter (Signed)
Spoke with pt, questions regarding PVC's and heart muscle function answered. Follow up scheduled per recall.

## 2014-07-25 NOTE — Telephone Encounter (Signed)
Patient states that she missed her call back from our office.  Please call back before 5.

## 2014-08-02 ENCOUNTER — Telehealth: Payer: Self-pay | Admitting: Internal Medicine

## 2014-08-02 NOTE — Telephone Encounter (Signed)
Spoke with pt, she went to the nurse at [facility] this morning for a blood pressure check. She is feeling fine with no problems and was just curious. Her bp was 158/78 and pulse 64. The nurse told her to call us because of her irregular pulse. Explained to pt she has that in her history and we are aware. Pt stated she is fine and not worried about it and she will call back if she had any problems.

## 2014-08-02 NOTE — Telephone Encounter (Signed)
Ms. Yost called in stating that her heartbeat is irregular(pulse was recorded at 44) and would like to speak to the nurse about it. Please call  Thanks

## 2014-09-10 DIAGNOSIS — H4050X0 Glaucoma secondary to other eye disorders, unspecified eye, stage unspecified: Secondary | ICD-10-CM | POA: Diagnosis not present

## 2014-09-10 DIAGNOSIS — H04129 Dry eye syndrome of unspecified lacrimal gland: Secondary | ICD-10-CM | POA: Diagnosis not present

## 2014-09-10 DIAGNOSIS — Z961 Presence of intraocular lens: Secondary | ICD-10-CM | POA: Diagnosis not present

## 2014-09-10 DIAGNOSIS — H4720 Unspecified optic atrophy: Secondary | ICD-10-CM | POA: Diagnosis not present

## 2014-10-10 DIAGNOSIS — I10 Essential (primary) hypertension: Secondary | ICD-10-CM | POA: Diagnosis not present

## 2014-10-10 DIAGNOSIS — M8580 Other specified disorders of bone density and structure, unspecified site: Secondary | ICD-10-CM | POA: Diagnosis not present

## 2014-10-10 DIAGNOSIS — E039 Hypothyroidism, unspecified: Secondary | ICD-10-CM | POA: Diagnosis not present

## 2014-10-10 DIAGNOSIS — M859 Disorder of bone density and structure, unspecified: Secondary | ICD-10-CM | POA: Diagnosis not present

## 2014-10-10 DIAGNOSIS — Z008 Encounter for other general examination: Secondary | ICD-10-CM | POA: Diagnosis not present

## 2014-10-17 DIAGNOSIS — I10 Essential (primary) hypertension: Secondary | ICD-10-CM | POA: Diagnosis not present

## 2014-10-17 DIAGNOSIS — Z6834 Body mass index (BMI) 34.0-34.9, adult: Secondary | ICD-10-CM | POA: Diagnosis not present

## 2014-10-17 DIAGNOSIS — D638 Anemia in other chronic diseases classified elsewhere: Secondary | ICD-10-CM | POA: Diagnosis not present

## 2014-10-17 DIAGNOSIS — E785 Hyperlipidemia, unspecified: Secondary | ICD-10-CM | POA: Diagnosis not present

## 2014-10-17 DIAGNOSIS — Z23 Encounter for immunization: Secondary | ICD-10-CM | POA: Diagnosis not present

## 2014-10-17 DIAGNOSIS — E039 Hypothyroidism, unspecified: Secondary | ICD-10-CM | POA: Diagnosis not present

## 2014-10-17 DIAGNOSIS — Z0000 Encounter for general adult medical examination without abnormal findings: Secondary | ICD-10-CM | POA: Diagnosis not present

## 2014-10-17 DIAGNOSIS — F329 Major depressive disorder, single episode, unspecified: Secondary | ICD-10-CM | POA: Diagnosis not present

## 2014-11-12 ENCOUNTER — Ambulatory Visit (INDEPENDENT_AMBULATORY_CARE_PROVIDER_SITE_OTHER): Payer: Medicare Other | Admitting: Emergency Medicine

## 2014-11-12 ENCOUNTER — Ambulatory Visit (INDEPENDENT_AMBULATORY_CARE_PROVIDER_SITE_OTHER): Payer: Medicare Other

## 2014-11-12 VITALS — BP 136/88 | HR 107 | Temp 97.8°F | Resp 16 | Ht 64.0 in | Wt 198.0 lb

## 2014-11-12 DIAGNOSIS — I2510 Atherosclerotic heart disease of native coronary artery without angina pectoris: Secondary | ICD-10-CM

## 2014-11-12 DIAGNOSIS — M79641 Pain in right hand: Secondary | ICD-10-CM

## 2014-11-12 DIAGNOSIS — M1990 Unspecified osteoarthritis, unspecified site: Secondary | ICD-10-CM | POA: Diagnosis not present

## 2014-11-12 MED ORDER — TRIAMCINOLONE ACETONIDE 40 MG/ML IJ SUSP: 40.0000 mg | Freq: Once | INTRAMUSCULAR | Status: DC

## 2014-11-12 NOTE — Progress Notes (Signed)
[facility] [address], [place] [address] [phone]  Date:  11/12/2014   Name:  Kathryn Carlson   DOB:  [birth year]   MRN:  [id number]  PCP:  Geoffery Lyons, MD    Chief Complaint: Hand Pain   History of Present Illness:  Kathryn Carlson is a 80 y.o. very pleasant female patient who presents with the following:  No history of injury.  Has pain in right thumb after sleeping last night.  Rather severe arthritis Denies other complaint or health concern today.   Patient Active Problem List   Diagnosis Date Noted  . Ventricular bigeminy 06/04/2014  . Premature ventricular contraction 01/14/2014  . NSVT (nonsustained ventricular tachycardia) 12/04/2013  . HTN (hypertension) 10/25/2013  . Dyslipidemia 10/25/2013  . CAD (coronary artery disease) 10/25/2013  . S/P CABG x 3 10/25/2013  . Symptomatic bradycardia 10/25/2013    Past Medical History  Diagnosis Date  . Hypertension   . Hypothyroidism   . Exogenous obesity   . History of total bilateral knee replacement   . CAD (coronary artery disease)     possible ant wall MI ('97) - cath & CABG x5  . GERD (gastroesophageal reflux disease)   . Squamous cell carcinoma of scalp     removed - Dr. Denna Haggard  . History of nuclear stress test 09/2012    lexiscan; mild perfusion defect in apical anterior & apical region (infarct/scar) - no significant ischemia, low risk   . PVC's (premature ventricular contractions)     Past Surgical History  Procedure Laterality Date  . Coronary artery bypass graft  09/1996    cath & CABG x5 LIMA-LAD, SVG-sequential OD & OM, SVG sequential to PDA & PLA (Dr. Prescott Gum)  . Appendectomy  1941  . Abdominal hysterectomy  1984  . Tonsillectomy    . Replacement total knee Bilateral 2001 & 2003  . Transthoracic echocardiogram  08/23/2013    EF 50-55%, LV cavity size mod reduced, mild LVH, mild conc hypertrophy; mild AV stenosis & mild regurg; mild MR - ordered for murmur   . Carotid doppler  12/07/2012    R & L ICA - 0-49% diameter reduction    History  Substance Use Topics  . Smoking status: Never Smoker   . Smokeless tobacco: Never Used  . Alcohol Use: Yes     Comment: "a drink of wine every now and then"    Family History  Problem Relation Age of Onset  . Heart attack Mother   . Heart attack Father   . Cancer Sister   . Diabetes Sister   . Heart Problems Sister     Allergies  Allergen Reactions  . Novocain [Procaine]     Medication list has been reviewed and updated.  Current Outpatient Prescriptions on File Prior to Visit  Medication Sig Dispense Refill  . aspirin 81 MG tablet Take 162 mg by mouth daily.     Marland Kitchen atorvastatin (LIPITOR) 40 MG tablet Take 40 mg by mouth daily.    . bisacodyl (DULCOLAX) 5 MG EC tablet Take 5 mg by mouth daily as needed for constipation.    . chloridazePOXIDE-amitriptyline (LIMBITROL) 5-12.5 MG per tablet Take 1 tablet by mouth daily.    . cholecalciferol (VITAMIN D) 1000 UNITS tablet Take 2,000 Units by mouth daily.    Marland Kitchen estradiol (ESTRACE) 1 MG tablet Take 1 mg by mouth daily.    . ISTALOL 0.5 % SOLN Place 1 drop into the  right eye daily.    Marland Kitchen levothyroxine (SYNTHROID, LEVOTHROID) 75 MCG tablet Take 75 mcg by mouth daily before breakfast.    . Multiple Vitamin (MULTIVITAMIN) capsule Take 1 capsule by mouth daily.    . niacin 500 MG tablet Take 500 mg by mouth daily with breakfast.    . pantoprazole (PROTONIX) 40 MG tablet TAKE 1 TABLET BY MOUTH EVERY DAY 90 tablet 3  . valsartan (DIOVAN) 80 MG tablet Take 160 mg by mouth daily.     Marland Kitchen VITAMIN E PO Take 400 mg by mouth daily. Plus D     No current facility-administered medications on file prior to visit.    Review of Systems:  As per HPI, otherwise negative.    Physical Examination: Filed Vitals:   11/12/14 1540  BP: 136/88  Pulse: 107  Temp: 97.8 F (36.6 C)  Resp: 16   Filed Vitals:   11/12/14 1540  Height: 5\' 4"  (1.626 m)  Weight: 198  lb (89.812 kg)   Body mass index is 33.97 kg/(m^2). Ideal Body Weight: Weight in (lb) to have BMI = 25: 145.3   GEN: WDWN, NAD, Non-toxic, Alert & Oriented x 3 HEENT: Atraumatic, Normocephalic.  Ears and Nose: No external deformity. EXTR: No clubbing/cyanosis/edema NEURO: Normal gait.  PSYCH: Normally interactive. Conversant. Not depressed or anxious appearing.  Calm demeanor.  Ulnar deviation of fingers mildly and thumb base  Assessment and Plan: Arthritis thumb  Signed,  Ellison Carwin, MD  [facility] reading (PRIMARY) by  Dr. Ouida Sills.  arthritis.   Injected MCP joint thumb with 10 mg kenalog

## 2014-12-04 ENCOUNTER — Encounter: Payer: Self-pay | Admitting: Internal Medicine

## 2014-12-04 ENCOUNTER — Ambulatory Visit (INDEPENDENT_AMBULATORY_CARE_PROVIDER_SITE_OTHER): Payer: Medicare Other | Admitting: Internal Medicine

## 2014-12-04 VITALS — BP 136/88 | HR 99 | Ht 66.0 in | Wt 198.6 lb

## 2014-12-04 DIAGNOSIS — E785 Hyperlipidemia, unspecified: Secondary | ICD-10-CM | POA: Diagnosis not present

## 2014-12-04 DIAGNOSIS — I472 Ventricular tachycardia: Secondary | ICD-10-CM | POA: Diagnosis not present

## 2014-12-04 DIAGNOSIS — R001 Bradycardia, unspecified: Secondary | ICD-10-CM | POA: Diagnosis not present

## 2014-12-04 DIAGNOSIS — I499 Cardiac arrhythmia, unspecified: Secondary | ICD-10-CM

## 2014-12-04 DIAGNOSIS — I10 Essential (primary) hypertension: Secondary | ICD-10-CM

## 2014-12-04 DIAGNOSIS — I4729 Other ventricular tachycardia: Secondary | ICD-10-CM

## 2014-12-04 DIAGNOSIS — I2510 Atherosclerotic heart disease of native coronary artery without angina pectoris: Secondary | ICD-10-CM

## 2014-12-04 DIAGNOSIS — I2583 Coronary atherosclerosis due to lipid rich plaque: Secondary | ICD-10-CM

## 2014-12-04 DIAGNOSIS — I498 Other specified cardiac arrhythmias: Secondary | ICD-10-CM

## 2014-12-04 DIAGNOSIS — Z951 Presence of aortocoronary bypass graft: Secondary | ICD-10-CM | POA: Diagnosis not present

## 2014-12-04 NOTE — Progress Notes (Signed)
OFFICE NOTE  Chief Complaint:  Routine follow-up, some indigestion  Primary Care Physician: Kathryn Lyons, MD  HPI:  Kathryn Carlson is a very pleasant 80 year old female presents to follow by Kathryn Carlson with history of coronary artery disease in a first anterior MI in 1997, she underwent three-vessel bypass with 2 grafts that were skipped grafts to 5 separate vessels. This was in 1997 by Dr. Prescott Carlson. Overall she's done well since then with appropriate management of her dyslipidemia, hypertension hypothyroidism and acid reflux. She wore monitor in September of 2013 Kathryn Carlson has had PVCs and bigeminy at times since then. An echocardiogram was performed in August of 2014 which showed an EF of 50-55%, apparently normal diastolic function (however this is almost impossible), mild aortic insufficiency and mild aortic stenosis, mild mitral regurgitation, and left atrial enlargement.  She recently saw Kathryn Carlson for a full physical. Her lipid profile was reperformed which showed total Crestor 171, triglycerides were elevated at 232 (apparently this has been persistently elevated), HDL 39 and LDL 86.  She is currently on Lipitor. Her blood pressure has been fairly well-controlled on her current medication regimen. Her main other symptom Carlson he is a progressive increase in shortness of breath and some fatigue as well as constipation. Her thyroid medication appears to be appropriate and her TSH and T4 within normal limits. She was previously on metoprolol tartrate 50 mg twice daily and this was recently reduced down to 25 mg once daily. Her heart rate had ranged in the 30s has come up now into the mid 40s, and her symptoms are somewhat better. I am concerned though that there is sinus node dysfunction.  At her last visit I recommended stopping metoprolol. She reports that she's had a marked improvement in her symptoms since that time. Her energy level is better and she is  not is easily fatigued. She wore a CardioNet monitor between 11/13/2013 11/26/2013. This demonstrated a minimum heart rate of 63. However there were numerous PVCs, couplets and runs of nonsustained VT, no greater than 5 beats. She is reportedly aware of some palpitations but not others.  I had referred her to see Kathryn Carlson in cardiac electrophysiology for evaluation of her frequent PVCs and nonsustained VT. He felt that this was coming from the posterior papillary muscle, possibly related to the anteroapical infarct she suffered in the past. There is clearly scar in this distribution on her nuclear stress test. He recommended that she start on low-dose nadolol and followup appointment with him she reported improvement in her symptoms. Recently, however she was seen by Kathryn Carlson and was found to have bradycardia and hypotension with heart rates in the 30s. Her nadolol was stopped and her symptoms improved. Carlson she is again in ventricular bigeminy, her PVCs are conducted and therefore her pulse rate is in the 90s.  Kathryn Carlson for follow-up. She is off of beta blockers completely and is noted to have a high normal heart rate Carlson in the 90s. She is not having any ventricular ectopy. She denies any chest pain palpitations or shortness of breath. Recently she's had worsening reflux symptoms and does not feel that her protonic is working as well for her.  PMHx:  Past Medical History  Diagnosis Date  . Hypertension   . Hypothyroidism   . Exogenous obesity   . History of total bilateral knee replacement   . CAD (coronary artery disease)  possible ant wall MI ('97) - cath & CABG x5  . GERD (gastroesophageal reflux disease)   . Squamous cell carcinoma of scalp     removed - Kathryn Carlson  . History of nuclear stress test 09/2012    lexiscan; mild perfusion defect in apical anterior & apical region (infarct/scar) - no significant ischemia, low risk   . PVC's (premature ventricular  contractions)     Past Surgical History  Procedure Laterality Date  . Coronary artery bypass graft  09/1996    cath & CABG x5 LIMA-LAD, SVG-sequential OD & OM, SVG sequential to PDA & PLA (Dr. Prescott Carlson) -   . Appendectomy  1941  . Abdominal hysterectomy  1984  . Tonsillectomy    . Replacement total knee Bilateral 2001 & 2003  . Transthoracic echocardiogram  08/23/2013    EF 50-55%, LV cavity size mod reduced, mild LVH, mild conc hypertrophy; mild AV stenosis & mild regurg; mild MR - ordered for murmur  . Carotid doppler  12/07/2012    R & L ICA - 0-49% diameter reduction    FAMHx:  Family History  Problem Relation Age of Onset  . Heart attack Mother   . Heart attack Father   . Cancer Sister   . Diabetes Sister   . Heart Problems Sister     SOCHx:   reports that she has never smoked. She has never used smokeless tobacco. She reports that she drinks alcohol. Her drug history is not on file.  ALLERGIES:  Allergies  Allergen Reactions  . Novocain [Procaine]     ROS: A comprehensive review of systems was negative except for: Constitutional: positive for fatigue Gastrointestinal: positive for reflux symptoms Endocrine: positive for temperature intolerance  HOME MEDS: Current Outpatient Prescriptions  Medication Sig Dispense Refill  . aspirin 81 MG tablet Take 162 mg by mouth daily.     Marland Kitchen atorvastatin (LIPITOR) 40 MG tablet Take 40 mg by mouth daily.    . chloridazePOXIDE-amitriptyline (LIMBITROL) 5-12.5 MG per tablet Take 1 tablet by mouth daily.    . cholecalciferol (VITAMIN D) 1000 UNITS tablet Take 2,000 Units by mouth daily.    Marland Kitchen estradiol (ESTRACE) 1 MG tablet Take 1 mg by mouth daily.    . ISTALOL 0.5 % SOLN Place 1 drop into the right eye daily.    Marland Kitchen levothyroxine (SYNTHROID, LEVOTHROID) 75 MCG tablet Take 75 mcg by mouth daily before breakfast.    . Multiple Vitamin (MULTIVITAMIN) capsule Take 1 capsule by mouth daily.    . niacin 500 MG tablet Take 500 mg by  mouth daily with breakfast.    . pantoprazole (PROTONIX) 40 MG tablet TAKE 1 TABLET BY MOUTH EVERY DAY 90 tablet 3  . valsartan (DIOVAN) 80 MG tablet Take 160 mg by mouth daily.     Marland Kitchen VITAMIN E PO Take 400 mg by mouth daily. Plus D     Current Facility-Administered Medications  Medication Dose Route Frequency Provider Last Rate Last Dose  . triamcinolone acetonide (KENALOG-40) injection 40 mg  40 mg Intramuscular Once Roselee Culver, MD        LABS/IMAGING: No results found for this or any previous visit (from the past 48 hour(s)). No results found.  VITALS: BP 136/88 mmHg  Pulse 99  Ht 5\' 6"  (1.676 m)  Wt 198 lb 9.6 oz (90.084 kg)  BMI 32.07 kg/m2  EXAM: Gen.: Awake, in no apparent distress HEENT: PERRLA, EOMI Lungs: Clear bilaterally Cardiovascular: Regular rate and rhythm normal  S1-S2 Abdomen: Soft, nontender Extremities: Trace lower Jevity swelling Neurologic: Awake and oriented Psychiatric: Normal  EKG: Normal sinus rhythm at 99  ASSESSMENT: 1. Coronary artery disease status post 5 vessel CABG in 1997 (with 3 grafts) 2. Recent symptomatic bradycardia -off of beta blockers 3. Dyslipidemia 4. Obesity 5. Frequent PVCs and nonsustained VT  PLAN: 1.   Mrs. Tobler has had recurrent PVCs and ventricular bigeminy which was suppressed by beta blockers however she feels much worse on those medications. She was taken off of her beta blocker now maintains a higher than normal sinus rhythm. Generally she is asymptomatic. She is complaining of some reflux symptoms and may benefit from a switch over to a different PPI such as Nexium. Her cholesterol is followed by her primary care provider. she denies any anginal symptoms. We'll continue to monitor her off of the beta blocker. As mentioned she may need to switch to a different PPI.   Pixie Casino, MD, [facility] Attending Cardiologist [facility]  HILTY,Kenneth C 12/04/2014, 4:11 PM

## 2014-12-04 NOTE — Patient Instructions (Signed)
Your physician wants you to follow-up in: 6 months with Dr. Hilty. You will receive a reminder letter in the mail two months in advance. If you don't receive a letter, please call our office to schedule the follow-up appointment.    

## 2014-12-12 DIAGNOSIS — L570 Actinic keratosis: Secondary | ICD-10-CM | POA: Diagnosis not present

## 2014-12-12 DIAGNOSIS — L821 Other seborrheic keratosis: Secondary | ICD-10-CM | POA: Diagnosis not present

## 2015-01-03 DIAGNOSIS — M5136 Other intervertebral disc degeneration, lumbar region: Secondary | ICD-10-CM | POA: Diagnosis not present

## 2015-01-03 DIAGNOSIS — M5441 Lumbago with sciatica, right side: Secondary | ICD-10-CM | POA: Diagnosis not present

## 2015-01-03 DIAGNOSIS — M791 Myalgia: Secondary | ICD-10-CM | POA: Diagnosis not present

## 2015-01-10 ENCOUNTER — Telehealth: Payer: Self-pay | Admitting: Internal Medicine

## 2015-01-10 NOTE — Telephone Encounter (Signed)
Pt called in stating that she just had her BP checked and is was recorded at 160/98. She sounds concerned and would like to know if she needs to come in to have her meds changed. Please call  Thanks

## 2015-01-10 NOTE — Telephone Encounter (Signed)
I spoke with patient.  She denies any symptoms.  She was walking down the hallway at [facility] and ran into the nurse and asked her to take her bp.  The nurse took it (160/98), but did not report the heart rate.  Kathryn Carlson continued with her walk.  I reassured Kathryn Carlson and commented that we would like to have a few more readings to see if this is a trend or just a rare occurrence.  She verbalized understanding and will have the nurse take her bp and hr and report those back to [place] tomorrow afternoon or Monday morning.  I advised her to call urgently if she developed any symptoms.

## 2015-01-11 ENCOUNTER — Telehealth: Payer: Self-pay | Admitting: Internal Medicine

## 2015-01-11 DIAGNOSIS — M5136 Other intervertebral disc degeneration, lumbar region: Secondary | ICD-10-CM | POA: Diagnosis not present

## 2015-01-11 MED ORDER — VALSARTAN 160 MG PO TABS: 320.0000 mg | ORAL_TABLET | Freq: Every day | ORAL | Status: DC

## 2015-01-11 NOTE — Telephone Encounter (Signed)
Spoke with patient. She states she just happened to see the nurse where she lives yesterday and decided to have her BP checked. It was 160/98. Last night it was 152/74. Heart rate was 80bpm both times.   Today her BP is 172/100 in RA and 180/102 in [place]. HR is 80bpm. Nurse told her her heart "sounded strong" and did not note any "skipping".   Patient has NO symptoms - she feels fine, just is concerned about her BP being elevated.   She is going to therapy at [facility] orth[facility] today at 2 - she has no pain.   Will defer this to Dr. Debara Pickett to review and advise on possible medication adjustments.  Takes valsartan 160mg  QD for BP.

## 2015-01-11 NOTE — Telephone Encounter (Signed)
Spoke with patient and informed her of med plan per Dr. Debara Pickett. She agrees with plan and voiced understanding. Rx sent to pharmacy with request to HOLD on file until patient needs refill. She will contact our office if her BP remains elevated and will make MLP appmt at that time if necessary

## 2015-01-11 NOTE — Telephone Encounter (Signed)
Pt called yesterday about blood pressure being elevated. Today it is higher,right arm is 172/110 and left arm is 180/102 pulse was 80.Please call to advise before 1:30.

## 2015-01-11 NOTE — Telephone Encounter (Signed)
Would check bp again tomorrow .Marland Kitchen If it remains elevated, increase valsartan to 320 mg daily.  Dr. Lemmie Evens

## 2015-01-14 ENCOUNTER — Telehealth: Payer: Self-pay | Admitting: Internal Medicine

## 2015-01-14 NOTE — Telephone Encounter (Signed)
Please call before 5,.her blood pressure is 180/100 pulse rate is 75.Pt thinks she needs to be seen.Pt says she is taking Diovan,wants to know if she can take another one tonight?

## 2015-01-14 NOTE — Telephone Encounter (Signed)
Pt. Called no answer LMTCB

## 2015-01-15 ENCOUNTER — Telehealth: Payer: Self-pay | Admitting: Cardiology

## 2015-01-15 ENCOUNTER — Telehealth: Payer: Self-pay | Admitting: Internal Medicine

## 2015-01-15 DIAGNOSIS — R002 Palpitations: Secondary | ICD-10-CM | POA: Diagnosis not present

## 2015-01-15 DIAGNOSIS — Z6834 Body mass index (BMI) 34.0-34.9, adult: Secondary | ICD-10-CM | POA: Diagnosis not present

## 2015-01-15 DIAGNOSIS — I10 Essential (primary) hypertension: Secondary | ICD-10-CM | POA: Diagnosis not present

## 2015-01-15 NOTE — Telephone Encounter (Signed)
No message was necessary.

## 2015-01-15 NOTE — Telephone Encounter (Signed)
Pt called in stating that she spoke with JC earlier this morning about some problems she had been having with BP. After consulting with Dr. Debara Pickett, JC relaying the message that she should double her dosage of Diovan and wait see if there is any change. She says that she just had a nurse to check her BP(after taking her fist double dosage) and her BP was now at the "highest its even been", 184/116. I told JC was she said and instructed me to tell her to wait a little while longer for the medicine to take its course and THEN if nothing has progressed then to give [place] a call back. She was upset to hear those instructions, she voiced her disappointment and hung up the phone.   Thanks

## 2015-01-28 DIAGNOSIS — I25810 Atherosclerosis of coronary artery bypass graft(s) without angina pectoris: Secondary | ICD-10-CM | POA: Diagnosis not present

## 2015-01-28 DIAGNOSIS — E039 Hypothyroidism, unspecified: Secondary | ICD-10-CM | POA: Diagnosis not present

## 2015-01-28 DIAGNOSIS — I350 Nonrheumatic aortic (valve) stenosis: Secondary | ICD-10-CM | POA: Diagnosis not present

## 2015-01-28 DIAGNOSIS — Z6834 Body mass index (BMI) 34.0-34.9, adult: Secondary | ICD-10-CM | POA: Diagnosis not present

## 2015-01-28 DIAGNOSIS — D638 Anemia in other chronic diseases classified elsewhere: Secondary | ICD-10-CM | POA: Diagnosis not present

## 2015-01-28 DIAGNOSIS — I6529 Occlusion and stenosis of unspecified carotid artery: Secondary | ICD-10-CM | POA: Diagnosis not present

## 2015-01-28 DIAGNOSIS — M8580 Other specified disorders of bone density and structure, unspecified site: Secondary | ICD-10-CM | POA: Diagnosis not present

## 2015-01-28 DIAGNOSIS — E785 Hyperlipidemia, unspecified: Secondary | ICD-10-CM | POA: Diagnosis not present

## 2015-01-28 DIAGNOSIS — I10 Essential (primary) hypertension: Secondary | ICD-10-CM | POA: Diagnosis not present

## 2015-01-28 DIAGNOSIS — Z1389 Encounter for screening for other disorder: Secondary | ICD-10-CM | POA: Diagnosis not present

## 2015-03-13 DIAGNOSIS — D239 Other benign neoplasm of skin, unspecified: Secondary | ICD-10-CM | POA: Diagnosis not present

## 2015-03-13 DIAGNOSIS — D049 Carcinoma in situ of skin, unspecified: Secondary | ICD-10-CM | POA: Diagnosis not present

## 2015-03-13 DIAGNOSIS — L570 Actinic keratosis: Secondary | ICD-10-CM | POA: Diagnosis not present

## 2015-04-18 DIAGNOSIS — Z6834 Body mass index (BMI) 34.0-34.9, adult: Secondary | ICD-10-CM | POA: Diagnosis not present

## 2015-04-18 DIAGNOSIS — M1990 Unspecified osteoarthritis, unspecified site: Secondary | ICD-10-CM | POA: Diagnosis not present

## 2015-04-18 DIAGNOSIS — F329 Major depressive disorder, single episode, unspecified: Secondary | ICD-10-CM | POA: Diagnosis not present

## 2015-04-18 DIAGNOSIS — I25810 Atherosclerosis of coronary artery bypass graft(s) without angina pectoris: Secondary | ICD-10-CM | POA: Diagnosis not present

## 2015-04-18 DIAGNOSIS — E039 Hypothyroidism, unspecified: Secondary | ICD-10-CM | POA: Diagnosis not present

## 2015-04-18 DIAGNOSIS — D692 Other nonthrombocytopenic purpura: Secondary | ICD-10-CM | POA: Diagnosis not present

## 2015-04-18 DIAGNOSIS — I10 Essential (primary) hypertension: Secondary | ICD-10-CM | POA: Diagnosis not present

## 2015-04-18 DIAGNOSIS — E785 Hyperlipidemia, unspecified: Secondary | ICD-10-CM | POA: Diagnosis not present

## 2015-04-18 DIAGNOSIS — I6529 Occlusion and stenosis of unspecified carotid artery: Secondary | ICD-10-CM | POA: Diagnosis not present

## 2015-04-18 DIAGNOSIS — Z1389 Encounter for screening for other disorder: Secondary | ICD-10-CM | POA: Diagnosis not present

## 2015-04-19 DIAGNOSIS — Z961 Presence of intraocular lens: Secondary | ICD-10-CM | POA: Diagnosis not present

## 2015-04-19 DIAGNOSIS — H4051X2 Glaucoma secondary to other eye disorders, right eye, moderate stage: Secondary | ICD-10-CM | POA: Diagnosis not present

## 2015-04-19 DIAGNOSIS — D2311 Other benign neoplasm of skin of right eyelid, including canthus: Secondary | ICD-10-CM | POA: Diagnosis not present

## 2015-07-11 ENCOUNTER — Other Ambulatory Visit (HOSPITAL_COMMUNITY): Payer: Self-pay | Admitting: Internal Medicine

## 2015-07-11 DIAGNOSIS — Z1231 Encounter for screening mammogram for malignant neoplasm of breast: Secondary | ICD-10-CM

## 2015-07-17 ENCOUNTER — Ambulatory Visit (HOSPITAL_COMMUNITY)
Admission: RE | Admit: 2015-07-17 | Discharge: 2015-07-17 | Disposition: A | Discharge status: Home / Self Care | Payer: Medicare Other | Source: Ambulatory Visit | Attending: Internal Medicine | Admitting: Internal Medicine

## 2015-07-17 ENCOUNTER — Other Ambulatory Visit (HOSPITAL_COMMUNITY): Payer: Self-pay | Admitting: Internal Medicine

## 2015-07-17 DIAGNOSIS — Z1231 Encounter for screening mammogram for malignant neoplasm of breast: Secondary | ICD-10-CM | POA: Diagnosis not present

## 2015-07-24 ENCOUNTER — Other Ambulatory Visit: Payer: Self-pay | Admitting: Internal Medicine

## 2015-07-24 DIAGNOSIS — R928 Other abnormal and inconclusive findings on diagnostic imaging of breast: Secondary | ICD-10-CM

## 2015-07-31 ENCOUNTER — Ambulatory Visit
Admission: RE | Admit: 2015-07-31 | Discharge: 2015-07-31 | Disposition: A | Discharge status: Home / Self Care | Payer: Medicare Other | Source: Ambulatory Visit | Attending: Internal Medicine | Admitting: Internal Medicine

## 2015-07-31 ENCOUNTER — Other Ambulatory Visit: Payer: Self-pay | Admitting: Internal Medicine

## 2015-07-31 DIAGNOSIS — R928 Other abnormal and inconclusive findings on diagnostic imaging of breast: Secondary | ICD-10-CM | POA: Diagnosis not present

## 2015-08-07 ENCOUNTER — Other Ambulatory Visit: Payer: Self-pay | Admitting: Internal Medicine

## 2015-08-07 ENCOUNTER — Ambulatory Visit
Admission: RE | Admit: 2015-08-07 | Discharge: 2015-08-07 | Disposition: A | Discharge status: Home / Self Care | Payer: Medicare Other | Source: Ambulatory Visit | Attending: Internal Medicine | Admitting: Internal Medicine

## 2015-08-07 DIAGNOSIS — R928 Other abnormal and inconclusive findings on diagnostic imaging of breast: Secondary | ICD-10-CM

## 2015-08-07 DIAGNOSIS — N63 Unspecified lump in breast: Secondary | ICD-10-CM | POA: Diagnosis not present

## 2015-08-07 DIAGNOSIS — N6011 Diffuse cystic mastopathy of right breast: Secondary | ICD-10-CM | POA: Diagnosis not present

## 2015-08-07 DIAGNOSIS — D241 Benign neoplasm of right breast: Secondary | ICD-10-CM | POA: Diagnosis not present

## 2015-08-12 DIAGNOSIS — I25810 Atherosclerosis of coronary artery bypass graft(s) without angina pectoris: Secondary | ICD-10-CM | POA: Diagnosis not present

## 2015-08-12 DIAGNOSIS — R350 Frequency of micturition: Secondary | ICD-10-CM | POA: Diagnosis not present

## 2015-08-12 DIAGNOSIS — I10 Essential (primary) hypertension: Secondary | ICD-10-CM | POA: Diagnosis not present

## 2015-08-12 DIAGNOSIS — E785 Hyperlipidemia, unspecified: Secondary | ICD-10-CM | POA: Diagnosis not present

## 2015-08-12 DIAGNOSIS — R8290 Unspecified abnormal findings in urine: Secondary | ICD-10-CM | POA: Diagnosis not present

## 2015-08-12 DIAGNOSIS — I6529 Occlusion and stenosis of unspecified carotid artery: Secondary | ICD-10-CM | POA: Diagnosis not present

## 2015-08-12 DIAGNOSIS — E039 Hypothyroidism, unspecified: Secondary | ICD-10-CM | POA: Diagnosis not present

## 2015-08-12 DIAGNOSIS — N390 Urinary tract infection, site not specified: Secondary | ICD-10-CM | POA: Diagnosis not present

## 2015-08-12 DIAGNOSIS — Z6834 Body mass index (BMI) 34.0-34.9, adult: Secondary | ICD-10-CM | POA: Diagnosis not present

## 2015-08-12 DIAGNOSIS — I350 Nonrheumatic aortic (valve) stenosis: Secondary | ICD-10-CM | POA: Diagnosis not present

## 2015-08-12 DIAGNOSIS — M1990 Unspecified osteoarthritis, unspecified site: Secondary | ICD-10-CM | POA: Diagnosis not present

## 2015-09-11 ENCOUNTER — Other Ambulatory Visit: Payer: Self-pay | Admitting: Dermatology

## 2015-09-11 DIAGNOSIS — L821 Other seborrheic keratosis: Secondary | ICD-10-CM | POA: Diagnosis not present

## 2015-09-11 DIAGNOSIS — D044 Carcinoma in situ of skin of scalp and neck: Secondary | ICD-10-CM | POA: Diagnosis not present

## 2015-09-11 DIAGNOSIS — L570 Actinic keratosis: Secondary | ICD-10-CM | POA: Diagnosis not present

## 2015-09-26 DIAGNOSIS — Z23 Encounter for immunization: Secondary | ICD-10-CM | POA: Diagnosis not present

## 2015-10-16 ENCOUNTER — Encounter: Payer: Self-pay | Admitting: Cardiovascular Disease

## 2015-10-16 DIAGNOSIS — I10 Essential (primary) hypertension: Secondary | ICD-10-CM | POA: Diagnosis not present

## 2015-10-16 DIAGNOSIS — E785 Hyperlipidemia, unspecified: Secondary | ICD-10-CM | POA: Diagnosis not present

## 2015-10-16 DIAGNOSIS — E039 Hypothyroidism, unspecified: Secondary | ICD-10-CM | POA: Diagnosis not present

## 2015-10-16 DIAGNOSIS — M859 Disorder of bone density and structure, unspecified: Secondary | ICD-10-CM | POA: Diagnosis not present

## 2015-10-23 DIAGNOSIS — R002 Palpitations: Secondary | ICD-10-CM | POA: Diagnosis not present

## 2015-10-23 DIAGNOSIS — Z1389 Encounter for screening for other disorder: Secondary | ICD-10-CM | POA: Diagnosis not present

## 2015-10-23 DIAGNOSIS — I10 Essential (primary) hypertension: Secondary | ICD-10-CM | POA: Diagnosis not present

## 2015-10-23 DIAGNOSIS — E785 Hyperlipidemia, unspecified: Secondary | ICD-10-CM | POA: Diagnosis not present

## 2015-10-23 DIAGNOSIS — Z6834 Body mass index (BMI) 34.0-34.9, adult: Secondary | ICD-10-CM | POA: Diagnosis not present

## 2015-10-23 DIAGNOSIS — I25810 Atherosclerosis of coronary artery bypass graft(s) without angina pectoris: Secondary | ICD-10-CM | POA: Diagnosis not present

## 2015-10-23 DIAGNOSIS — I6529 Occlusion and stenosis of unspecified carotid artery: Secondary | ICD-10-CM | POA: Diagnosis not present

## 2015-10-23 DIAGNOSIS — Z0000 Encounter for general adult medical examination without abnormal findings: Secondary | ICD-10-CM | POA: Diagnosis not present

## 2015-10-23 DIAGNOSIS — F329 Major depressive disorder, single episode, unspecified: Secondary | ICD-10-CM | POA: Diagnosis not present

## 2015-10-23 DIAGNOSIS — D638 Anemia in other chronic diseases classified elsewhere: Secondary | ICD-10-CM | POA: Diagnosis not present

## 2015-10-23 DIAGNOSIS — I350 Nonrheumatic aortic (valve) stenosis: Secondary | ICD-10-CM | POA: Diagnosis not present

## 2015-10-24 DIAGNOSIS — D044 Carcinoma in situ of skin of scalp and neck: Secondary | ICD-10-CM | POA: Diagnosis not present

## 2016-01-10 DIAGNOSIS — I10 Essential (primary) hypertension: Secondary | ICD-10-CM | POA: Diagnosis not present

## 2016-01-10 DIAGNOSIS — M6281 Muscle weakness (generalized): Secondary | ICD-10-CM | POA: Diagnosis not present

## 2016-01-10 DIAGNOSIS — R5383 Other fatigue: Secondary | ICD-10-CM | POA: Diagnosis not present

## 2016-01-10 DIAGNOSIS — D649 Anemia, unspecified: Secondary | ICD-10-CM | POA: Diagnosis not present

## 2016-01-10 DIAGNOSIS — Z6834 Body mass index (BMI) 34.0-34.9, adult: Secondary | ICD-10-CM | POA: Diagnosis not present

## 2016-01-13 DIAGNOSIS — D638 Anemia in other chronic diseases classified elsewhere: Secondary | ICD-10-CM | POA: Diagnosis not present

## 2016-01-13 DIAGNOSIS — D649 Anemia, unspecified: Secondary | ICD-10-CM | POA: Diagnosis not present

## 2016-01-15 ENCOUNTER — Other Ambulatory Visit: Payer: Self-pay | Admitting: Dermatology

## 2016-01-15 DIAGNOSIS — D0462 Carcinoma in situ of skin of left upper limb, including shoulder: Secondary | ICD-10-CM | POA: Diagnosis not present

## 2016-01-15 DIAGNOSIS — D0430 Carcinoma in situ of skin of unspecified part of face: Secondary | ICD-10-CM | POA: Diagnosis not present

## 2016-01-15 DIAGNOSIS — D0439 Carcinoma in situ of skin of other parts of face: Secondary | ICD-10-CM | POA: Diagnosis not present

## 2016-01-28 ENCOUNTER — Ambulatory Visit: Payer: Medicare Other | Admitting: Internal Medicine

## 2016-02-10 DIAGNOSIS — G43909 Migraine, unspecified, not intractable, without status migrainosus: Secondary | ICD-10-CM | POA: Diagnosis not present

## 2016-02-10 DIAGNOSIS — H04123 Dry eye syndrome of bilateral lacrimal glands: Secondary | ICD-10-CM | POA: Diagnosis not present

## 2016-02-10 DIAGNOSIS — Z961 Presence of intraocular lens: Secondary | ICD-10-CM | POA: Diagnosis not present

## 2016-02-10 DIAGNOSIS — H4051X2 Glaucoma secondary to other eye disorders, right eye, moderate stage: Secondary | ICD-10-CM | POA: Diagnosis not present

## 2016-02-12 ENCOUNTER — Encounter: Payer: Self-pay | Admitting: Cardiovascular Disease

## 2016-02-12 ENCOUNTER — Ambulatory Visit (INDEPENDENT_AMBULATORY_CARE_PROVIDER_SITE_OTHER): Payer: Medicare Other | Admitting: Cardiovascular Disease

## 2016-02-12 VITALS — BP 148/78 | HR 80 | Ht 65.0 in | Wt 197.0 lb

## 2016-02-12 DIAGNOSIS — E785 Hyperlipidemia, unspecified: Secondary | ICD-10-CM

## 2016-02-12 DIAGNOSIS — I10 Essential (primary) hypertension: Secondary | ICD-10-CM | POA: Diagnosis not present

## 2016-02-12 DIAGNOSIS — I493 Ventricular premature depolarization: Secondary | ICD-10-CM | POA: Diagnosis not present

## 2016-02-12 DIAGNOSIS — Z951 Presence of aortocoronary bypass graft: Secondary | ICD-10-CM

## 2016-02-12 NOTE — Assessment & Plan Note (Signed)
History of hypertension with blood pressure measurements at 148/78. She is on Diovan and Bystolic . Continue current meds at current dosing

## 2016-02-12 NOTE — Patient Instructions (Signed)

## 2016-02-12 NOTE — Assessment & Plan Note (Signed)
History of hyperlipidemia on atorvastatin followed by her PCP 

## 2016-02-12 NOTE — Progress Notes (Signed)
02/12/2016 Kathryn Carlson   01/10/1927  [id number]  Primary Physician Geoffery Lyons, MD Primary Cardiologist: Lorretta Harp MD Kathryn Carlson   HPI:  Kathryn Carlson is a delightful 82 year old moderately overweight widowed Caucasian female mother of 4 children who is ransitioning her care from Dr. Debara Pickett to myself at her request. She was formally a patient of Dr. Lowella Fairy. She has a history of ischemic heart disease status post myocardial infarction at age 89. She had coronary artery bypass grafting in 1997 by Dr. Michelene Heady. Her last Myoview performed in 2013 was nonischemic. Her other Problems include symptomatic PVCs evaluated by Dr. Rayann Heman in the past as well as history of hypertension and hyperlipidemia. She denies chest pain or shortness of breath.     Current Outpatient Prescriptions  Medication Sig Dispense Refill  . aspirin 81 MG tablet Take 162 mg by mouth daily.     Marland Kitchen atorvastatin (LIPITOR) 40 MG tablet Take 40 mg by mouth daily.    . chloridazePOXIDE-amitriptyline (LIMBITROL) 5-12.5 MG per tablet Take 1 tablet by mouth daily.    . cholecalciferol (VITAMIN D) 1000 UNITS tablet Take 2,000 Units by mouth daily.    Marland Kitchen estradiol (ESTRACE) 1 MG tablet Take 1 mg by mouth daily.    . ISTALOL 0.5 % SOLN Place 1 drop into the right eye daily.    Marland Kitchen levothyroxine (SYNTHROID, LEVOTHROID) 75 MCG tablet Take 75 mcg by mouth daily before breakfast.    . Multiple Vitamin (MULTIVITAMIN) capsule Take 1 capsule by mouth daily.    . niacin 500 MG tablet Take 500 mg by mouth daily with breakfast.    . pantoprazole (PROTONIX) 40 MG tablet TAKE 1 TABLET BY MOUTH EVERY DAY 90 tablet 3  . valsartan (DIOVAN) 160 MG tablet Take 2 tablets (320 mg total) by mouth daily. 60 tablet 6  . VITAMIN E PO Take 400 mg by mouth daily. Plus D    . BYSTOLIC 2.5 MG tablet Take 2.5 mg by mouth daily.  3   Current Facility-Administered Medications  Medication Dose Route Frequency Provider Last  Rate Last Dose  . triamcinolone acetonide (KENALOG-40) injection 40 mg  40 mg Intramuscular Once Roselee Culver, MD        Allergies  Allergen Reactions  . Novocain [Procaine]     Social History   Social History  . Marital Status: Widowed    Spouse Name: N/A  . Number of Children: 4  . Years of Education: 13   Occupational History  . Not on file.   Social History Main Topics  . Smoking status: Never Smoker   . Smokeless tobacco: Never Used  . Alcohol Use: Yes     Comment: "a drink of wine every now and then"  . Drug Use: Not on file  . Sexual Activity: Not on file   Other Topics Concern  . Not on file   Social History Narrative   Lives in [place]     Review of Systems: General: negative for chills, fever, night sweats or weight changes.  Cardiovascular: negative for chest pain, dyspnea on exertion, edema, orthopnea, palpitations, paroxysmal nocturnal dyspnea or shortness of breath Dermatological: negative for rash Respiratory: negative for cough or wheezing Urologic: negative for hematuria Abdominal: negative for nausea, vomiting, diarrhea, bright red blood per rectum, melena, or hematemesis Neurologic: negative for visual changes, syncope, or dizziness All other systems reviewed and are otherwise negative except as noted above.    Blood pressure 148/78,  pulse 80, height 5\' 5"  (1.651 m), weight 197 lb (89.359 kg).  General appearance: alert and no distress Neck: no adenopathy, no carotid bruit, no JVD, supple, symmetrical, trachea midline and thyroid not enlarged, symmetric, no tenderness/mass/nodules Lungs: clear to auscultation bilaterally Heart: soft outflow tract murmur consistent with aortic stenosis Extremities: extremities normal, atraumatic, no cyanosis or edema  EKG normal sinus rhythm at 88 with PVCs. I personally reviewed this EKG  ASSESSMENT AND PLAN:   HTN (hypertension) History of hypertension with blood pressure measurements at 148/78.  She is on Diovan and Bystolic . Continue current meds at current dosing  Dyslipidemia History of hyperlipidemia on atorvastatin followed by her PCP  S/P CABG x 3 History of remote myocardial infarction age 36. She had coronary artery bypass grafting by Dr. Prescott Gum  in 1997 with three-vessel bypass.she denies chest pain or shortness of breath. Her last Myoview performed 09/29/12 was nonischemic.  Premature ventricular contraction History of PVCs evaluated by Dr. Rayann Heman in the past. She was briefly tried on Nadalol which resulted in symptomatic bradycardia.      Lorretta Harp MD FACP,FACC,FAHA, [facility] 02/12/2016 2:07 PM

## 2016-02-12 NOTE — Assessment & Plan Note (Signed)
History of PVCs evaluated by Dr. Rayann Heman in the past. She was briefly tried on Nadalol which resulted in symptomatic bradycardia.

## 2016-02-12 NOTE — Assessment & Plan Note (Signed)
History of remote myocardial infarction age 82. She had coronary artery bypass grafting by Dr. Prescott Gum  in 1997 with three-vessel bypass.she denies chest pain or shortness of breath. Her last Myoview performed 09/29/12 was nonischemic.

## 2016-03-12 DIAGNOSIS — D0439 Carcinoma in situ of skin of other parts of face: Secondary | ICD-10-CM | POA: Diagnosis not present

## 2016-03-30 DIAGNOSIS — S6982XA Other specified injuries of left wrist, hand and finger(s), initial encounter: Secondary | ICD-10-CM | POA: Diagnosis not present

## 2016-04-15 DIAGNOSIS — I350 Nonrheumatic aortic (valve) stenosis: Secondary | ICD-10-CM | POA: Diagnosis not present

## 2016-04-15 DIAGNOSIS — I6529 Occlusion and stenosis of unspecified carotid artery: Secondary | ICD-10-CM | POA: Diagnosis not present

## 2016-04-15 DIAGNOSIS — M6281 Muscle weakness (generalized): Secondary | ICD-10-CM | POA: Diagnosis not present

## 2016-04-15 DIAGNOSIS — Z1389 Encounter for screening for other disorder: Secondary | ICD-10-CM | POA: Diagnosis not present

## 2016-04-15 DIAGNOSIS — I25810 Atherosclerosis of coronary artery bypass graft(s) without angina pectoris: Secondary | ICD-10-CM | POA: Diagnosis not present

## 2016-04-15 DIAGNOSIS — R5383 Other fatigue: Secondary | ICD-10-CM | POA: Diagnosis not present

## 2016-04-15 DIAGNOSIS — Z6834 Body mass index (BMI) 34.0-34.9, adult: Secondary | ICD-10-CM | POA: Diagnosis not present

## 2016-04-15 DIAGNOSIS — I10 Essential (primary) hypertension: Secondary | ICD-10-CM | POA: Diagnosis not present

## 2016-04-15 DIAGNOSIS — F329 Major depressive disorder, single episode, unspecified: Secondary | ICD-10-CM | POA: Diagnosis not present

## 2016-04-15 DIAGNOSIS — D692 Other nonthrombocytopenic purpura: Secondary | ICD-10-CM | POA: Diagnosis not present

## 2016-04-15 DIAGNOSIS — E784 Other hyperlipidemia: Secondary | ICD-10-CM | POA: Diagnosis not present

## 2016-04-15 DIAGNOSIS — D638 Anemia in other chronic diseases classified elsewhere: Secondary | ICD-10-CM | POA: Diagnosis not present

## 2016-06-12 DIAGNOSIS — Z0100 Encounter for examination of eyes and vision without abnormal findings: Secondary | ICD-10-CM | POA: Diagnosis not present

## 2016-06-12 DIAGNOSIS — H04123 Dry eye syndrome of bilateral lacrimal glands: Secondary | ICD-10-CM | POA: Diagnosis not present

## 2016-06-12 DIAGNOSIS — H40051 Ocular hypertension, right eye: Secondary | ICD-10-CM | POA: Diagnosis not present

## 2016-06-12 DIAGNOSIS — Z961 Presence of intraocular lens: Secondary | ICD-10-CM | POA: Diagnosis not present

## 2016-07-15 ENCOUNTER — Telehealth: Payer: Self-pay | Admitting: Cardiovascular Disease

## 2016-07-15 NOTE — Telephone Encounter (Signed)
Returned pt call left message.

## 2016-07-15 NOTE — Telephone Encounter (Signed)
New message     The pt is calling concerning the general weakness she is having the pt states she feels as through she needs to checked, the pt will be back home at 12-5 pm, the pt states this is not a emergency

## 2016-07-16 NOTE — Telephone Encounter (Signed)
F/u  Pt returning RN phone call- please call before 12pm. Please call back and discuss.

## 2016-07-16 NOTE — Telephone Encounter (Signed)
Returned call to patient.She stated she would like appointment with Dr.Berry.Stated she has noticed for the past 2 to 3 weeks she is weak,perspires heavy at times.No chest pain,no sob.Stated she would feel better if Dr.Berry checked her.Appointment scheduled with Dr.Berry 08/12/16 at 11:00 am.Advised to call sooner if needed.

## 2016-07-30 ENCOUNTER — Telehealth: Payer: Self-pay | Admitting: Cardiovascular Disease

## 2016-07-30 DIAGNOSIS — Z79899 Other long term (current) drug therapy: Secondary | ICD-10-CM

## 2016-07-30 DIAGNOSIS — E038 Other specified hypothyroidism: Secondary | ICD-10-CM

## 2016-07-30 NOTE — Telephone Encounter (Signed)
New message    Please call pt about appt. Pt would not give any details.

## 2016-07-30 NOTE — Telephone Encounter (Signed)
Returned patient call-pt wanted to call and see if she needed any blood work prior to appt on 8/16 with MD [name] f/u weakness.     Pt reports she is still weak, maybe not as weak when she spoke to [place] last on 07/15/16, is having HA, denies pain or SOB.    Will route to MD for recommendations, if lab work is needed prior.    Pt aware and verbalized understanding.

## 2016-07-31 ENCOUNTER — Encounter: Payer: Self-pay | Admitting: Cardiovascular Disease

## 2016-07-31 NOTE — Telephone Encounter (Signed)
CBC, BMET  And TSH

## 2016-07-31 NOTE — Telephone Encounter (Signed)
Called patient-pt aware lab work is ordered and can be drawn prior to appt with MD [name].  Verbalized understanding and states she will get it drawn next week. Pt made aware of lab location.  Advised to call with concerns or questions.

## 2016-08-03 ENCOUNTER — Telehealth: Payer: Self-pay | Admitting: *Deleted

## 2016-08-03 DIAGNOSIS — Z79899 Other long term (current) drug therapy: Secondary | ICD-10-CM | POA: Diagnosis not present

## 2016-08-03 DIAGNOSIS — E038 Other specified hypothyroidism: Secondary | ICD-10-CM | POA: Diagnosis not present

## 2016-08-03 LAB — CBC
HCT: 36.3 % (ref 35.0–45.0)
Hemoglobin: 11.7 g/dL (ref 11.7–15.5)
MCH: 30.9 pg (ref 27.0–33.0)
MCHC: 32.2 g/dL (ref 32.0–36.0)
MCV: 95.8 fL (ref 80.0–100.0)
MPV: 9.9 fL (ref 7.5–12.5)
Platelets: 294 10*3/uL (ref 140–400)
RBC: 3.79 MIL/uL — ABNORMAL LOW (ref 3.80–5.10)
RDW: 14.1 % (ref 11.0–15.0)
WBC: 9 10*3/uL (ref 3.8–10.8)

## 2016-08-03 NOTE — Telephone Encounter (Signed)
Received incoming call from [facility] in [place]. She had patient at lab asking if she was supposed to get a heart monitor today. According to chart, patient has only been ordered lab work and has appt with Dr [name] on 08/12/16.  Rayanne verbalized understanding.

## 2016-08-04 LAB — BASIC METABOLIC PANEL
BUN: 21 mg/dL (ref 7–25)
CALCIUM: 9 mg/dL (ref 8.6–10.4)
CO2: 27 mmol/L (ref 20–31)
CREATININE: 1 mg/dL — AB (ref 0.60–0.88)
Chloride: 101 mmol/L (ref 98–110)
GLUCOSE: 101 mg/dL — AB (ref 65–99)
Potassium: 5.1 mmol/L (ref 3.5–5.3)
SODIUM: 137 mmol/L (ref 135–146)

## 2016-08-04 LAB — TSH: TSH: 2.84 m[IU]/L

## 2016-08-12 ENCOUNTER — Encounter: Payer: Self-pay | Admitting: Cardiovascular Disease

## 2016-08-12 ENCOUNTER — Ambulatory Visit (INDEPENDENT_AMBULATORY_CARE_PROVIDER_SITE_OTHER): Payer: Medicare Other | Admitting: Cardiovascular Disease

## 2016-08-12 VITALS — BP 162/90 | HR 91 | Ht 65.0 in | Wt 198.4 lb

## 2016-08-12 DIAGNOSIS — I10 Essential (primary) hypertension: Secondary | ICD-10-CM

## 2016-08-12 DIAGNOSIS — E785 Hyperlipidemia, unspecified: Secondary | ICD-10-CM

## 2016-08-12 DIAGNOSIS — Z951 Presence of aortocoronary bypass graft: Secondary | ICD-10-CM

## 2016-08-12 NOTE — Assessment & Plan Note (Signed)
History of coronary artery disease status post myocardial infarction at age 82. She had coronary artery bypass grafting in 1987 by Dr. Tharon Aquas Trigt  with a LIMA to LAD, vein graft to an intermediate branch, obtuse marginal branch and the posterior descending and posterior lateral branches. She denies chest pain or shortness of breath. Her last Myoview performed in 2013 was nonischemic.

## 2016-08-12 NOTE — Assessment & Plan Note (Signed)
History of hypertension with blood pressure measured at 183/97. She is on metoprolol and valsartan. I encouraged to have her keep a blood pressure log for the next 30 days and will return her to be seen by Cyril Mourning, our Pharm D  for blood pressure check and medicine titration.

## 2016-08-12 NOTE — Assessment & Plan Note (Signed)
History of hyperlipidemia on statin therapy followed by her PCP. 

## 2016-08-12 NOTE — Patient Instructions (Addendum)
Medication Instructions:  Your physician recommends that you continue on your current medications as directed. Please refer to the Current Medication list given to you today.   Labwork: Labwork will be requested from your primary care physician.   Follow-Up: You have been referred to OUR PHARMACY TO BE SEEN IN OUR BLOOD PRESSURE CLINIC IN 4 WEEKS.   Your physician wants you to follow-up in: [place]. You will receive a reminder letter in the mail two months in advance. If you don't receive a letter, please call our office to schedule the follow-up appointment.   Any Other Special Instructions Will Be Listed Below (If Applicable).  Your physician has requested that you regularly monitor and record your blood pressure readings at home. Please use the same machine at the same time of day to check your readings and record them to bring to your follow-up visit.   If you need a refill on your cardiac medications before your next appointment, please call your pharmacy.

## 2016-08-12 NOTE — Progress Notes (Signed)
08/12/2016 Kathryn Carlson   [birth year]  [id number]  Primary Physician Geoffery Lyons, MD Primary Cardiologist: Lorretta Harp MD Renae Gloss  HPI:  Kathryn Carlson is a delightful 82 year old moderately overweight widowed Caucasian female mother of 4 children who transitioned her care from Dr. Debara Pickett to myself at her request. She is accompanied by her daughter Kathryn Carlson today. She was formally a patient of Dr. Lowella Fairy. She has a history of ischemic heart disease status post myocardial infarction at age 54. She had coronary artery bypass grafting in 1997 by Dr. Prescott Gum  Her last Myoview performed in 2013 was nonischemic. Her other Problems include symptomatic PVCs evaluated by Dr. Rayann Heman in the past as well as history of hypertension and hyperlipidemia. She denies chest pain or shortness of breath since I saw her in the office 6 months ago.   Current Outpatient Prescriptions  Medication Sig Dispense Refill  . aspirin 81 MG tablet Take 162 mg by mouth daily.     Marland Kitchen atorvastatin (LIPITOR) 40 MG tablet Take 40 mg by mouth daily.    . chloridazePOXIDE-amitriptyline (LIMBITROL) 5-12.5 MG per tablet Take 1 tablet by mouth daily.    . cholecalciferol (VITAMIN D) 1000 UNITS tablet Take 2,000 Units by mouth daily.    Marland Kitchen estradiol (ESTRACE) 1 MG tablet Take 1 mg by mouth daily.    . ISTALOL 0.5 % SOLN Place 1 drop into the right eye daily.    Marland Kitchen levothyroxine (SYNTHROID, LEVOTHROID) 75 MCG tablet Take 75 mcg by mouth daily before breakfast.    . metoprolol succinate (TOPROL-XL) 50 MG 24 hr tablet Take 50 mg by mouth daily.  5  . Multiple Vitamin (MULTIVITAMIN) capsule Take 1 capsule by mouth daily.    . niacin 500 MG tablet Take 500 mg by mouth daily with breakfast.    . pantoprazole (PROTONIX) 40 MG tablet TAKE 1 TABLET BY MOUTH EVERY DAY 90 tablet 3  . valsartan (DIOVAN) 160 MG tablet Take 2 tablets (320 mg total) by mouth daily. 60 tablet 6  . VITAMIN E PO Take 400 mg by mouth  daily. Plus D     Current Facility-Administered Medications  Medication Dose Route Frequency Provider Last Rate Last Dose  . triamcinolone acetonide (KENALOG-40) injection 40 mg  40 mg Intramuscular Once Roselee Culver, MD        Allergies  Allergen Reactions  . Novocain [Procaine]     Social History   Social History  . Marital status: Widowed    Spouse name: N/A  . Number of children: 4  . Years of education: 24   Occupational History  . Not on file.   Social History Main Topics  . Smoking status: Never Smoker  . Smokeless tobacco: Never Used  . Alcohol use Yes     Comment: "a drink of wine every now and then"  . Drug use: Unknown  . Sexual activity: Not on file   Other Topics Concern  . Not on file   Social History Narrative   Lives in [place]     Review of Systems: General: negative for chills, fever, night sweats or weight changes.  Cardiovascular: negative for chest pain, dyspnea on exertion, edema, orthopnea, palpitations, paroxysmal nocturnal dyspnea or shortness of breath Dermatological: negative for rash Respiratory: negative for cough or wheezing Urologic: negative for hematuria Abdominal: negative for nausea, vomiting, diarrhea, bright red blood per rectum, melena, or hematemesis Neurologic: negative for visual changes, syncope, or dizziness  All other systems reviewed and are otherwise negative except as noted above.    Blood pressure (!) 183/97, pulse 91, height 5\' 5"  (1.651 m), weight 198 lb 6.4 oz (90 kg).  General appearance: alert and no distress Neck: no adenopathy, no carotid bruit, no JVD, supple, symmetrical, trachea midline and thyroid not enlarged, symmetric, no tenderness/mass/nodules Lungs: clear to auscultation bilaterally Heart: Soft outflow tract murmur consistent with aortic stenosis Extremities: extremities normal, atraumatic, no cyanosis or edema  EKG normal sinus rhythm at 91 with an occasional PVC and nonspecific ST and  T-wave changes. I personally reviewed this EKG  ASSESSMENT AND PLAN:   HTN (hypertension) History of hypertension with blood pressure measured at 183/97. She is on metoprolol and valsartan. I encouraged to have her keep a blood pressure log for the next 30 days and will return her to be seen by Cyril Mourning, our Pharm D  for blood pressure check and medicine titration.  Dyslipidemia History of hyperlipidemia on statin therapy followed by her PCP  S/P CABG x 3 History of coronary artery disease status post myocardial infarction at age 29. She had coronary artery bypass grafting in 1987 by Dr. Tharon Aquas Trigt  with a LIMA to LAD, vein graft to an intermediate branch, obtuse marginal branch and the posterior descending and posterior lateral branches. She denies chest pain or shortness of breath. Her last Myoview performed in 2013 was nonischemic.      Lorretta Harp MD FACP,FACC,FAHA, [facility] 08/12/2016 11:15 AM

## 2016-08-26 DIAGNOSIS — L570 Actinic keratosis: Secondary | ICD-10-CM | POA: Diagnosis not present

## 2016-08-26 DIAGNOSIS — D239 Other benign neoplasm of skin, unspecified: Secondary | ICD-10-CM | POA: Diagnosis not present

## 2016-09-11 ENCOUNTER — Ambulatory Visit (INDEPENDENT_AMBULATORY_CARE_PROVIDER_SITE_OTHER): Payer: Medicare Other | Admitting: Pharmacist Clinician (PhC)/ Clinical Pharmacy Specialist

## 2016-09-11 DIAGNOSIS — I10 Essential (primary) hypertension: Secondary | ICD-10-CM | POA: Diagnosis not present

## 2016-09-11 NOTE — Patient Instructions (Signed)
  Your blood pressure today is 128/72 (goal is < 150/90)   Check your blood pressure at home several times each week and keep record of the readings. Call if you notice a trend in the readings to be > 150/90.  You can call [phone] and leave a message for the pharmacist. Erasmo Downer or Georgina Peer)  Take your BP meds as follows:  Continue with metoprolol and valsartan  Bring all of your meds, your BP cuff and your record of home blood pressures to your next appointment.  Exercise as you're able, try to walk approximately 30 minutes per day.  Keep salt intake to a minimum, especially watch canned and prepared boxed foods.  Eat more fresh fruits and vegetables and fewer canned items.  Avoid eating in fast food restaurants.    HOW TO TAKE YOUR BLOOD PRESSURE: . Rest 5 minutes before taking your blood pressure. .  Don't smoke or drink caffeinated beverages for at least 30 minutes before. . Take your blood pressure before (not after) you eat. . Sit comfortably with your back supported and both feet on the floor (don't cross your legs). . Elevate your arm to heart level on a table or a desk. . Use the proper sized cuff. It should fit smoothly and snugly around your bare upper arm. There should be enough room to slip a fingertip under the cuff. The bottom edge of the cuff should be 1 inch above the crease of the elbow. . Ideally, take 3 measurements at one sitting and record the average.

## 2016-09-11 NOTE — Progress Notes (Signed)
09/11/2016 Kathryn Carlson March 13, 1927 [id number]   HPI:  Kathryn Carlson is a 82 y.o. female patient of Dr [name], with a PMH below who presents today for hypertension clinic evaluation.  She has a long cardiac history, having suffered an MI at the age of 63 and CABG x 5 at 30.  She lives at [facility] in the [place] area, but does go down to Assisted living on a daily basis to get her blood pressure checked.     Blood Pressure Goal:   150/90   Current Medications:  Metoprolol succ 50 mg qam, valsartan 160 mg qam  Cardiac Hx:  Significant for MI at 43, CABG x 5 at 70.  Last Myoview in 2013 with no ischemia; hypertension, hyperlipidemia (last LDL 10/16 was 31)  Family Hx:  Father died from MI at 105, his twin brother at 46; 1 sister with heart disease; 1 daughter with hypertension and renal stent; son with early onset Alzheimers at age 37  Social Hx:  No tobacco, drinks only occasional glass of wine (white), no regular caffeine (drinks decaf coffee and tea)  Diet:  Eats breakfast and lunch in her apartment; cereals, yogurt, sandwiches or soups; dinner in dining hall.  Does not add salt, but notes that some of the dishes are rather salty in taste.  Exercise:  Walks regularly, but has some balance issues due to neuropathy in left leg/foot.  Home BP readings:  Taken by nursing staff at assisted living.  59 readings since saw Dr. [name]. Average was 142/81 with a range of 122-186/70-98.  Many of the readings appear to be "rounded" to 130/80, 140/90...   Wt Readings from Last 3 Encounters:  08/12/16 198 lb 6.4 oz (90 kg)  02/12/16 197 lb (89.4 kg)  12/04/14 198 lb 9.6 oz (90.1 kg)   BP Readings from Last 3 Encounters:  09/11/16 128/72  08/12/16 (!) 162/90  02/12/16 (!) 148/78   Pulse Readings from Last 3 Encounters:  09/11/16 84  08/12/16 91  02/12/16 80    Current Outpatient Prescriptions  Medication Sig Dispense Refill  . aspirin 81 MG tablet Take 162  mg by mouth daily.     Marland Kitchen atorvastatin (LIPITOR) 40 MG tablet Take 40 mg by mouth daily.    . chloridazePOXIDE-amitriptyline (LIMBITROL) 5-12.5 MG per tablet Take 1 tablet by mouth daily.    . cholecalciferol (VITAMIN D) 1000 UNITS tablet Take 2,000 Units by mouth daily.    Marland Kitchen estradiol (ESTRACE) 1 MG tablet Take 1 mg by mouth daily.    . ISTALOL 0.5 % SOLN Place 1 drop into the right eye daily.    Marland Kitchen levothyroxine (SYNTHROID, LEVOTHROID) 75 MCG tablet Take 75 mcg by mouth daily before breakfast.    . metoprolol succinate (TOPROL-XL) 50 MG 24 hr tablet Take 50 mg by mouth daily.  5  . Multiple Vitamin (MULTIVITAMIN) capsule Take 1 capsule by mouth daily.    . niacin 500 MG tablet Take 500 mg by mouth daily with breakfast.    . pantoprazole (PROTONIX) 40 MG tablet TAKE 1 TABLET BY MOUTH EVERY DAY 90 tablet 3  . valsartan (DIOVAN) 160 MG tablet Take 2 tablets (320 mg total) by mouth daily. 60 tablet 6  . VITAMIN E PO Take 400 mg by mouth daily. Plus D     Current Facility-Administered Medications  Medication Dose Route Frequency Provider Last Rate Last Dose  . triamcinolone acetonide (KENALOG-40) injection 40 mg  40 mg  Intramuscular Once Roselee Culver, MD        Allergies  Allergen Reactions  . Novocain [Procaine]     Past Medical History:  Diagnosis Date  . CAD (coronary artery disease)    possible ant wall MI ('97) - cath & CABG x5  . Exogenous obesity   . GERD (gastroesophageal reflux disease)   . History of nuclear stress test 09/2012   lexiscan; mild perfusion defect in apical anterior & apical region (infarct/scar) - no significant ischemia, low risk   . History of total bilateral knee replacement   . Hypertension   . Hypothyroidism   . PVC's (premature ventricular contractions)   . Squamous cell carcinoma of scalp    removed - Dr. Denna Haggard    Blood pressure 128/72, pulse 84.  HTN (hypertension) Blood pressure looked good in the office today.  She is tolerating the  valsartan without any issues.  Will not make any changes at this time, as her average home BP looks good for her age.  She still has some readings > 150 (about 27%), but because of some balance issues I would not want to increase her medications and risk episodes of hypotension.  She will continue to watch her home pressure readings and knows to call should they trend to > Q000111Q systolic on a regular basis.   Tommy Medal PharmD CPP  [facility]

## 2016-09-11 NOTE — Assessment & Plan Note (Signed)
Blood pressure looked good in the office today.  She is tolerating the valsartan without any issues.  Will not make any changes at this time, as her average home BP looks good for her age.  She still has some readings > 150 (about 27%), but because of some balance issues I would not want to increase her medications and risk episodes of hypotension.  She will continue to watch her home pressure readings and knows to call should they trend to > Q000111Q systolic on a regular basis.

## 2016-10-01 ENCOUNTER — Other Ambulatory Visit: Payer: Self-pay | Admitting: Dermatology

## 2016-10-01 DIAGNOSIS — D0460 Carcinoma in situ of skin of unspecified upper limb, including shoulder: Secondary | ICD-10-CM | POA: Diagnosis not present

## 2016-10-01 DIAGNOSIS — D044 Carcinoma in situ of skin of scalp and neck: Secondary | ICD-10-CM | POA: Diagnosis not present

## 2016-10-01 DIAGNOSIS — D692 Other nonthrombocytopenic purpura: Secondary | ICD-10-CM | POA: Diagnosis not present

## 2016-10-01 DIAGNOSIS — L570 Actinic keratosis: Secondary | ICD-10-CM | POA: Diagnosis not present

## 2016-10-01 DIAGNOSIS — D0462 Carcinoma in situ of skin of left upper limb, including shoulder: Secondary | ICD-10-CM | POA: Diagnosis not present

## 2016-10-08 ENCOUNTER — Telehealth: Payer: Self-pay | Admitting: Pharmacist Clinician (PhC)/ Clinical Pharmacy Specialist

## 2016-10-08 DIAGNOSIS — Z23 Encounter for immunization: Secondary | ICD-10-CM | POA: Diagnosis not present

## 2016-10-08 NOTE — Telephone Encounter (Signed)
Spoke with patient - she lives at [facility], has checked by RN there several times per week.  Reports no readings > Q000111Q systolic, yesterday was [id number].    Assured her that as long as the numbers look good she does not need to return for follow up BP check.  She knows to call if elevated, otherwise will see Dr. [name] in February.

## 2016-10-08 NOTE — Telephone Encounter (Signed)
New message      Calling to see when she is supposed to come to the bp clinic for a reck

## 2016-10-19 DIAGNOSIS — I10 Essential (primary) hypertension: Secondary | ICD-10-CM | POA: Diagnosis not present

## 2016-10-19 DIAGNOSIS — M859 Disorder of bone density and structure, unspecified: Secondary | ICD-10-CM | POA: Diagnosis not present

## 2016-10-19 DIAGNOSIS — E038 Other specified hypothyroidism: Secondary | ICD-10-CM | POA: Diagnosis not present

## 2016-10-26 DIAGNOSIS — E038 Other specified hypothyroidism: Secondary | ICD-10-CM | POA: Diagnosis not present

## 2016-10-26 DIAGNOSIS — I10 Essential (primary) hypertension: Secondary | ICD-10-CM | POA: Diagnosis not present

## 2016-10-26 DIAGNOSIS — I350 Nonrheumatic aortic (valve) stenosis: Secondary | ICD-10-CM | POA: Diagnosis not present

## 2016-10-26 DIAGNOSIS — I25810 Atherosclerosis of coronary artery bypass graft(s) without angina pectoris: Secondary | ICD-10-CM | POA: Diagnosis not present

## 2016-10-26 DIAGNOSIS — R002 Palpitations: Secondary | ICD-10-CM | POA: Diagnosis not present

## 2016-10-26 DIAGNOSIS — Z6834 Body mass index (BMI) 34.0-34.9, adult: Secondary | ICD-10-CM | POA: Diagnosis not present

## 2016-10-26 DIAGNOSIS — D638 Anemia in other chronic diseases classified elsewhere: Secondary | ICD-10-CM | POA: Diagnosis not present

## 2016-10-26 DIAGNOSIS — M8580 Other specified disorders of bone density and structure, unspecified site: Secondary | ICD-10-CM | POA: Diagnosis not present

## 2016-10-26 DIAGNOSIS — Z0000 Encounter for general adult medical examination without abnormal findings: Secondary | ICD-10-CM | POA: Diagnosis not present

## 2016-10-26 DIAGNOSIS — I6529 Occlusion and stenosis of unspecified carotid artery: Secondary | ICD-10-CM | POA: Diagnosis not present

## 2016-10-26 DIAGNOSIS — E784 Other hyperlipidemia: Secondary | ICD-10-CM | POA: Diagnosis not present

## 2016-10-26 DIAGNOSIS — M1990 Unspecified osteoarthritis, unspecified site: Secondary | ICD-10-CM | POA: Diagnosis not present

## 2016-11-10 ENCOUNTER — Other Ambulatory Visit: Payer: Self-pay | Admitting: Dermatology

## 2016-11-10 DIAGNOSIS — C44329 Squamous cell carcinoma of skin of other parts of face: Secondary | ICD-10-CM | POA: Diagnosis not present

## 2016-11-10 DIAGNOSIS — L858 Other specified epidermal thickening: Secondary | ICD-10-CM | POA: Diagnosis not present

## 2016-11-10 DIAGNOSIS — L570 Actinic keratosis: Secondary | ICD-10-CM | POA: Diagnosis not present

## 2017-01-08 ENCOUNTER — Telehealth: Payer: Self-pay | Admitting: Cardiovascular Disease

## 2017-01-08 NOTE — Telephone Encounter (Signed)
SPOKE TO PATIENT.  DO NOT TAKE ANYMORE MEDICATION TODAY.   START TOMORROW WITH REGULAR DOSE .   REVIEWED WITH CLINICAL PHARMACIST.   PATIENT VERBALIZED UNDERSTANDING

## 2017-01-08 NOTE — Telephone Encounter (Signed)
Pt calling regarding taking 320 mg of Valsartan and 50mg  of Metoprolol, she found a Valsartan on the floor so not sure if she dropped it or took it. Wants to know if she is better off not taking it another one versus possibly taking two doses? Will be home 20 more minutes or can leave message on machine.

## 2017-01-08 NOTE — Telephone Encounter (Signed)
Pt calling again,she wants to know what to do about her medicine.

## 2017-02-01 ENCOUNTER — Other Ambulatory Visit: Payer: Self-pay | Admitting: Dermatology

## 2017-02-01 DIAGNOSIS — C44629 Squamous cell carcinoma of skin of left upper limb, including shoulder: Secondary | ICD-10-CM | POA: Diagnosis not present

## 2017-02-05 ENCOUNTER — Encounter: Payer: Self-pay | Admitting: Cardiovascular Disease

## 2017-02-05 ENCOUNTER — Ambulatory Visit (INDEPENDENT_AMBULATORY_CARE_PROVIDER_SITE_OTHER): Payer: Medicare Other | Admitting: Cardiovascular Disease

## 2017-02-05 VITALS — BP 154/88 | HR 79 | Ht 65.0 in | Wt 200.0 lb

## 2017-02-05 DIAGNOSIS — Z951 Presence of aortocoronary bypass graft: Secondary | ICD-10-CM | POA: Diagnosis not present

## 2017-02-05 DIAGNOSIS — R011 Cardiac murmur, unspecified: Secondary | ICD-10-CM | POA: Diagnosis not present

## 2017-02-05 DIAGNOSIS — I10 Essential (primary) hypertension: Secondary | ICD-10-CM | POA: Diagnosis not present

## 2017-02-05 DIAGNOSIS — I350 Nonrheumatic aortic (valve) stenosis: Secondary | ICD-10-CM | POA: Insufficient documentation

## 2017-02-05 DIAGNOSIS — E785 Hyperlipidemia, unspecified: Secondary | ICD-10-CM | POA: Diagnosis not present

## 2017-02-05 NOTE — Assessment & Plan Note (Signed)
History of hypertension blood pressure agitated 154/88. She is on Toprol and valsartan. Continue current meds at current dosing

## 2017-02-05 NOTE — Progress Notes (Signed)
02/05/2017 Kathryn Carlson   [birth year]  [id number]  Primary Physician Geoffery Lyons, MD Primary Cardiologist: Lorretta Harp MD Renae Gloss  HPI:   Kathryn Carlson is a delightful 83 year old moderately overweight widowed Caucasian female mother of 4 children who transitioned her care from Dr. Debara Pickett to myself at her request. I last saw her in the office 08/12/16. She was formally a patient of Dr. Lowella Fairy. She has a history of ischemic heart disease status post myocardial infarction at age 53. She had coronary artery bypass grafting in 1997 by Dr. Prescott Gum  Her last Myoview performed in 2013 was nonischemic. Her other Problems include symptomatic PVCs evaluated by Dr. Rayann Heman in the past as well as history of hypertension and hyperlipidemia. She denies chest pain or shortness of breath since I saw her in the office 6 months ago.   Current Outpatient Prescriptions  Medication Sig Dispense Refill  . aspirin 81 MG tablet Take 162 mg by mouth daily.     Marland Kitchen atorvastatin (LIPITOR) 40 MG tablet Take 40 mg by mouth daily.    . chloridazePOXIDE-amitriptyline (LIMBITROL) 5-12.5 MG per tablet Take 1 tablet by mouth daily.    . cholecalciferol (VITAMIN D) 1000 UNITS tablet Take 2,000 Units by mouth daily.    Marland Kitchen estradiol (ESTRACE) 1 MG tablet Take 1 mg by mouth daily.    . ISTALOL 0.5 % SOLN Place 1 drop into the right eye daily.    Marland Kitchen levothyroxine (SYNTHROID, LEVOTHROID) 75 MCG tablet Take 75 mcg by mouth daily before breakfast.    . metoprolol succinate (TOPROL-XL) 50 MG 24 hr tablet Take 50 mg by mouth daily.  5  . Multiple Vitamin (MULTIVITAMIN) capsule Take 1 capsule by mouth daily.    . niacin 500 MG tablet Take 500 mg by mouth daily with breakfast.    . pantoprazole (PROTONIX) 40 MG tablet TAKE 1 TABLET BY MOUTH EVERY DAY 90 tablet 3  . valsartan (DIOVAN) 160 MG tablet Take 2 tablets (320 mg total) by mouth daily. 60 tablet 6  . VITAMIN E PO Take 400 mg by mouth daily. Plus  D    . zolpidem (AMBIEN) 10 MG tablet Take 1 tablet by mouth as directed.  3   Current Facility-Administered Medications  Medication Dose Route Frequency Provider Last Rate Last Dose  . triamcinolone acetonide (KENALOG-40) injection 40 mg  40 mg Intramuscular Once Roselee Culver, MD        Allergies  Allergen Reactions  . Novocain [Procaine]     Social History   Social History  . Marital status: Widowed    Spouse name: N/A  . Number of children: 4  . Years of education: 52   Occupational History  . Not on file.   Social History Main Topics  . Smoking status: Never Smoker  . Smokeless tobacco: Never Used  . Alcohol use Yes     Comment: "a drink of wine every now and then"  . Drug use: Unknown  . Sexual activity: Not on file   Other Topics Concern  . Not on file   Social History Narrative   Lives in [place]     Review of Systems: General: negative for chills, fever, night sweats or weight changes.  Cardiovascular: negative for chest pain, dyspnea on exertion, edema, orthopnea, palpitations, paroxysmal nocturnal dyspnea or shortness of breath Dermatological: negative for rash Respiratory: negative for cough or wheezing Urologic: negative for hematuria Abdominal: negative for nausea, vomiting,  diarrhea, bright red blood per rectum, melena, or hematemesis Neurologic: negative for visual changes, syncope, or dizziness All other systems reviewed and are otherwise negative except as noted above.    Blood pressure (!) 154/88, pulse 79, height 5\' 5"  (1.651 m), weight 200 lb (90.7 kg).  General appearance: alert and no distress Neck: no adenopathy, no JVD, supple, symmetrical, trachea midline, thyroid not enlarged, symmetric, no tenderness/mass/nodules and Soft left carotid bruit Lungs: clear to auscultation bilaterally Heart: Soft outflow tract murmur consistent with aortic stenosis Extremities: extremities normal, atraumatic, no cyanosis or edema  EKG sinus  rhythm at 79 without ST or T-wave changes. I personally reviewed this EKG  ASSESSMENT AND PLAN:   HTN (hypertension) History of hypertension blood pressure agitated 154/88. She is on Toprol and valsartan. Continue current meds at current dosing  Dyslipidemia History of hyperlipidemia on statin therapy followed by her PCP  S/P CABG x 3 History of coronary artery disease status post bypass grafting X 5 in 1997 by Dr. Prescott Gum with a LIMA to LAD, vein to an intermediate branch, obtuse marginal branch and PDA/PLA sequentially. Her last Myoview performed in 2013 was nonischemic. She denies chest pain.  Aortic stenosis, mild Wall history of murmur with 2-D echo performed 08/23/13 revealing normal LV function with mild aortic stenosis. Her aortic valve area was 1.147 m with a peak gradient 20 mmHg. We will recheck a 2-D echocardiogram.      Lorretta Harp MD [facility], F[facility] 02/05/2017 1:40 PM

## 2017-02-05 NOTE — Assessment & Plan Note (Signed)
History of hyperlipidemia on statin therapy followed by her PCP. 

## 2017-02-05 NOTE — Assessment & Plan Note (Signed)
Wall history of murmur with 2-D echo performed 08/23/13 revealing normal LV function with mild aortic stenosis. Her aortic valve area was 1.147 m with a peak gradient 20 mmHg. We will recheck a 2-D echocardiogram.

## 2017-02-05 NOTE — Patient Instructions (Signed)
Medication Instructions: Your physician recommends that you continue on your current medications as directed. Please refer to the Current Medication list given to you today.  Testing/Procedures: Your physician has requested that you have an echocardiogram. Echocardiography is a painless test that uses sound waves to create images of your heart. It provides your doctor with information about the size and shape of your heart and how well your heart's chambers and valves are working. This procedure takes approximately one hour. There are no restrictions for this procedure.  Follow-Up: Your physician wants you to follow-up in: 1 year with Dr. Berry. You will receive a reminder letter in the mail two months in advance. If you don't receive a letter, please call our office to schedule the follow-up appointment.  If you need a refill on your cardiac medications before your next appointment, please call your pharmacy.  

## 2017-02-05 NOTE — Assessment & Plan Note (Signed)
History of coronary artery disease status post bypass grafting X 5 in 1997 by Dr. Prescott Gum with a LIMA to LAD, vein to an intermediate branch, obtuse marginal branch and PDA/PLA sequentially. Her last Myoview performed in 2013 was nonischemic. She denies chest pain.

## 2017-02-08 ENCOUNTER — Telehealth: Payer: Self-pay | Admitting: Cardiovascular Disease

## 2017-02-08 DIAGNOSIS — Z79899 Other long term (current) drug therapy: Secondary | ICD-10-CM

## 2017-02-08 DIAGNOSIS — E7849 Other hyperlipidemia: Secondary | ICD-10-CM

## 2017-02-08 NOTE — Telephone Encounter (Signed)
Pt of Dr. [name] Seen Friday. She'd informed Dr. [name] she'd be following up w Dr. Reynaldo Minium in March, wanted to get bloodwork then. Clarifies that she found out her PCP follow up isn't until May.  Wants to know if labs advised in interim? Had CBC , BMET, TSH done 6 months ago. Reorder? Any additional tests?  Pt aware I'll seek Dr. Kennon Holter review and follow up w her. Voiced understanding and thanks.

## 2017-02-08 NOTE — Telephone Encounter (Signed)
New Message  Pt call requesting to speak with RN. Pt states she has some questions for RN before she reaches out to PCP. Please call back to discuss

## 2017-02-09 NOTE — Telephone Encounter (Signed)
That's fine with me. She'll need a FLP at that time as well

## 2017-02-09 NOTE — Addendum Note (Signed)
Addended by: Zebedee Iba on: 02/09/2017 09:47 AM   Modules accepted: Orders

## 2017-02-11 NOTE — Telephone Encounter (Signed)
Pt advised on recommendations for return labwork. She voiced understanding of instructions. Aware to present to [facility] fasting for labs at her convenience during their business hours.  She knows she may call if further questions or concerns.

## 2017-02-18 DIAGNOSIS — C44629 Squamous cell carcinoma of skin of left upper limb, including shoulder: Secondary | ICD-10-CM | POA: Diagnosis not present

## 2017-02-18 DIAGNOSIS — C44329 Squamous cell carcinoma of skin of other parts of face: Secondary | ICD-10-CM | POA: Diagnosis not present

## 2017-02-18 DIAGNOSIS — L570 Actinic keratosis: Secondary | ICD-10-CM | POA: Diagnosis not present

## 2017-02-23 ENCOUNTER — Telehealth: Payer: Self-pay | Admitting: Cardiovascular Disease

## 2017-02-23 NOTE — Telephone Encounter (Signed)
Patient advised of recommendations. I recommended she try to take 1/2 tab of her ambien 10mg  tab if she feels she needs the medication for rest. She voiced understanding and acknowledgment.

## 2017-02-23 NOTE — Telephone Encounter (Signed)
New Message   Pt is scheduled for a echo tomorrow, and deals with insomnia at times. Wants to know if needed tonight can she take a sleeping pill or will it interfere with the test tomorrow. Requesting call back

## 2017-02-23 NOTE — Telephone Encounter (Signed)
Okay to take Ambien if needed as lower effective dose.

## 2017-02-23 NOTE — Telephone Encounter (Signed)
Patient takes ambien PRN for sleep. Needing to know if this will affect her echo test.  I'm not aware of any problems w this but will verify w pharmD.

## 2017-02-24 ENCOUNTER — Other Ambulatory Visit: Payer: Self-pay

## 2017-02-24 ENCOUNTER — Ambulatory Visit (HOSPITAL_COMMUNITY): Payer: Medicare Other | Attending: Cardiovascular Disease

## 2017-02-24 DIAGNOSIS — I340 Nonrheumatic mitral (valve) insufficiency: Secondary | ICD-10-CM | POA: Diagnosis not present

## 2017-02-24 DIAGNOSIS — R011 Cardiac murmur, unspecified: Secondary | ICD-10-CM | POA: Diagnosis not present

## 2017-02-24 DIAGNOSIS — I352 Nonrheumatic aortic (valve) stenosis with insufficiency: Secondary | ICD-10-CM | POA: Diagnosis not present

## 2017-02-24 DIAGNOSIS — I501 Left ventricular failure: Secondary | ICD-10-CM | POA: Insufficient documentation

## 2017-02-24 LAB — ECHOCARDIOGRAM COMPLETE
AV Area mean vel: 1.04 cm2
AV VEL mean LVOT/AV: 0.33
AV area mean vel ind: 0.53 cm2/m2
AV peak Index: 0.55
AV pk vel: 232 cm/s
AVAREAVTI: 1.09 cm2
AVAREAVTIIND: 0.57 cm2/m2
AVG: 12 mmHg
AVPG: 22 mmHg
AVPHT: 360 ms
Ao pk vel: 0.35 m/s
CHL CUP AV VALUE AREA INDEX: 0.57
CHL CUP AV VEL: 1.13
DOP CAL AO MEAN VELOCITY: 164 cm/s
E/e' ratio: 12.77
EWDT: 151 ms
FS: 29 % (ref 28–44)
IVS/LV PW RATIO, ED: 1.13
LA ID, A-P, ES: 45 mm
LA diam end sys: 45 mm
LA diam index: 2.27 cm/m2
LA vol A4C: 48.4 ml
LA vol index: 23 mL/m2
LAVOL: 45.5 mL
LDCA: 3.14 cm2
LV PW d: 7.99 mm — AB (ref 0.6–1.1)
LV TDI E'MEDIAL: 6.74
LV e' LATERAL: 7.62 cm/s
LVEEAVG: 12.77
LVEEMED: 12.77
LVOT SV: 64 mL
LVOT VTI: 20.3 cm
LVOT diameter: 20 mm
LVOT peak VTI: 0.36 cm
LVOT peak vel: 80.9 cm/s
Lateral S' vel: 9.32 cm/s
MV Dec: 151
MV pk A vel: 69.8 m/s
MVPG: 4 mmHg
MVPKEVEL: 97.3 m/s
RV TAPSE: 15.2 mm
RV sys press: 47 mmHg
Reg peak vel: 314 cm/s
TDI e' lateral: 7.62
TR max vel: 314 cm/s
VTI: 56.5 cm
Valve area: 1.13 cm2

## 2017-02-26 ENCOUNTER — Telehealth: Payer: Self-pay | Admitting: Cardiovascular Disease

## 2017-02-26 NOTE — Telephone Encounter (Signed)
New message      Pt called to tell us that she will be away from her phone this afternoon.  Therefore, if we get her echo results, please call on Monday.

## 2017-03-01 NOTE — Telephone Encounter (Signed)
Follow up    Pt is calling about echo results. She says she will be home all day and near her phone.

## 2017-03-01 NOTE — Telephone Encounter (Signed)
Results and recommendations discussed with patient, who verbalized understanding and thanks.  

## 2017-03-03 ENCOUNTER — Other Ambulatory Visit: Payer: Self-pay | Admitting: Cardiovascular Disease

## 2017-03-03 DIAGNOSIS — I350 Nonrheumatic aortic (valve) stenosis: Secondary | ICD-10-CM

## 2017-03-03 DIAGNOSIS — E784 Other hyperlipidemia: Secondary | ICD-10-CM | POA: Diagnosis not present

## 2017-03-03 DIAGNOSIS — Z79899 Other long term (current) drug therapy: Secondary | ICD-10-CM | POA: Diagnosis not present

## 2017-03-03 LAB — BASIC METABOLIC PANEL
BUN: 22 mg/dL (ref 7–25)
CO2: 27 mmol/L (ref 20–31)
Calcium: 8.8 mg/dL (ref 8.6–10.4)
Chloride: 104 mmol/L (ref 98–110)
Creat: 1.08 mg/dL — ABNORMAL HIGH (ref 0.60–0.88)
GLUCOSE: 111 mg/dL — AB (ref 65–99)
Potassium: 5.1 mmol/L (ref 3.5–5.3)
SODIUM: 138 mmol/L (ref 135–146)

## 2017-03-03 LAB — LIPID PANEL
CHOL/HDL RATIO: 3.6 ratio (ref <5.0)
Cholesterol: 151 mg/dL (ref <200)
HDL: 42 mg/dL — ABNORMAL LOW (ref >50)
LDL Cholesterol: 62 mg/dL (ref <100)
Triglycerides: 233 mg/dL — ABNORMAL HIGH (ref <150)
VLDL: 47 mg/dL — ABNORMAL HIGH (ref <30)

## 2017-03-03 LAB — CBC
HCT: 35.7 % (ref 35.0–45.0)
HEMOGLOBIN: 11.6 g/dL — AB (ref 11.7–15.5)
MCH: 31.9 pg (ref 27.0–33.0)
MCHC: 32.5 g/dL (ref 32.0–36.0)
MCV: 98.1 fL (ref 80.0–100.0)
MPV: 9.3 fL (ref 7.5–12.5)
PLATELETS: 279 10*3/uL (ref 140–400)
RBC: 3.64 MIL/uL — ABNORMAL LOW (ref 3.80–5.10)
RDW: 14 % (ref 11.0–15.0)
WBC: 6 10*3/uL (ref 3.8–10.8)

## 2017-03-03 LAB — HEPATIC FUNCTION PANEL
ALBUMIN: 3.6 g/dL (ref 3.6–5.1)
ALK PHOS: 133 U/L — AB (ref 33–130)
ALT: 14 U/L (ref 6–29)
AST: 20 U/L (ref 10–35)
Bilirubin, Direct: 0.1 mg/dL (ref <=0.2)
Indirect Bilirubin: 0.3 mg/dL (ref 0.2–1.2)
TOTAL PROTEIN: 6.8 g/dL (ref 6.1–8.1)
Total Bilirubin: 0.4 mg/dL (ref 0.2–1.2)

## 2017-03-03 LAB — TSH: TSH: 2.94 mIU/L

## 2017-03-30 DIAGNOSIS — Z96651 Presence of right artificial knee joint: Secondary | ICD-10-CM | POA: Diagnosis not present

## 2017-03-30 DIAGNOSIS — Z471 Aftercare following joint replacement surgery: Secondary | ICD-10-CM | POA: Diagnosis not present

## 2017-03-30 DIAGNOSIS — S8001XA Contusion of right knee, initial encounter: Secondary | ICD-10-CM | POA: Diagnosis not present

## 2017-03-30 DIAGNOSIS — Z96653 Presence of artificial knee joint, bilateral: Secondary | ICD-10-CM | POA: Diagnosis not present

## 2017-03-30 DIAGNOSIS — Z96652 Presence of left artificial knee joint: Secondary | ICD-10-CM | POA: Diagnosis not present

## 2017-04-26 DIAGNOSIS — L409 Psoriasis, unspecified: Secondary | ICD-10-CM | POA: Diagnosis not present

## 2017-04-29 DIAGNOSIS — E784 Other hyperlipidemia: Secondary | ICD-10-CM | POA: Diagnosis not present

## 2017-04-29 DIAGNOSIS — R002 Palpitations: Secondary | ICD-10-CM | POA: Diagnosis not present

## 2017-04-29 DIAGNOSIS — M859 Disorder of bone density and structure, unspecified: Secondary | ICD-10-CM | POA: Diagnosis not present

## 2017-04-29 DIAGNOSIS — G4709 Other insomnia: Secondary | ICD-10-CM | POA: Diagnosis not present

## 2017-04-29 DIAGNOSIS — M1990 Unspecified osteoarthritis, unspecified site: Secondary | ICD-10-CM | POA: Diagnosis not present

## 2017-04-29 DIAGNOSIS — F3289 Other specified depressive episodes: Secondary | ICD-10-CM | POA: Diagnosis not present

## 2017-04-29 DIAGNOSIS — Z6833 Body mass index (BMI) 33.0-33.9, adult: Secondary | ICD-10-CM | POA: Diagnosis not present

## 2017-04-29 DIAGNOSIS — E038 Other specified hypothyroidism: Secondary | ICD-10-CM | POA: Diagnosis not present

## 2017-04-29 DIAGNOSIS — D692 Other nonthrombocytopenic purpura: Secondary | ICD-10-CM | POA: Diagnosis not present

## 2017-04-29 DIAGNOSIS — I10 Essential (primary) hypertension: Secondary | ICD-10-CM | POA: Diagnosis not present

## 2017-04-29 DIAGNOSIS — M6281 Muscle weakness (generalized): Secondary | ICD-10-CM | POA: Diagnosis not present

## 2017-04-29 DIAGNOSIS — Z1389 Encounter for screening for other disorder: Secondary | ICD-10-CM | POA: Diagnosis not present

## 2017-05-03 DIAGNOSIS — D049 Carcinoma in situ of skin, unspecified: Secondary | ICD-10-CM | POA: Diagnosis not present

## 2017-05-03 DIAGNOSIS — L821 Other seborrheic keratosis: Secondary | ICD-10-CM | POA: Diagnosis not present

## 2017-05-03 DIAGNOSIS — L309 Dermatitis, unspecified: Secondary | ICD-10-CM | POA: Diagnosis not present

## 2017-06-04 DIAGNOSIS — M5417 Radiculopathy, lumbosacral region: Secondary | ICD-10-CM | POA: Diagnosis not present

## 2017-06-04 DIAGNOSIS — M47816 Spondylosis without myelopathy or radiculopathy, lumbar region: Secondary | ICD-10-CM | POA: Diagnosis not present

## 2017-06-04 DIAGNOSIS — M5136 Other intervertebral disc degeneration, lumbar region: Secondary | ICD-10-CM | POA: Diagnosis not present

## 2017-06-21 ENCOUNTER — Telehealth: Payer: Self-pay | Admitting: Cardiovascular Disease

## 2017-06-21 NOTE — Telephone Encounter (Signed)
Returned call to patient.She stated wanted to know 02/27/17 echo results.Stated she has been feeling weak and alittle sob.No chest pain.02/27/17 echo results given.Patient was offered appointment but she stated she would call back.

## 2017-06-21 NOTE — Telephone Encounter (Signed)
New Message  Pt call requesting to speak with RN about her last echo. Please call back to discuss

## 2017-08-12 DIAGNOSIS — M25561 Pain in right knee: Secondary | ICD-10-CM | POA: Diagnosis not present

## 2017-08-12 DIAGNOSIS — Z96653 Presence of artificial knee joint, bilateral: Secondary | ICD-10-CM | POA: Diagnosis not present

## 2017-08-12 DIAGNOSIS — Z96651 Presence of right artificial knee joint: Secondary | ICD-10-CM | POA: Diagnosis not present

## 2017-08-12 DIAGNOSIS — M25361 Other instability, right knee: Secondary | ICD-10-CM | POA: Diagnosis not present

## 2017-08-17 ENCOUNTER — Other Ambulatory Visit: Payer: Self-pay | Admitting: Dermatology

## 2017-08-17 DIAGNOSIS — C4491 Basal cell carcinoma of skin, unspecified: Secondary | ICD-10-CM

## 2017-08-17 DIAGNOSIS — C44719 Basal cell carcinoma of skin of left lower limb, including hip: Secondary | ICD-10-CM | POA: Diagnosis not present

## 2017-08-17 HISTORY — DX: Basal cell carcinoma of skin, unspecified: C44.91

## 2017-09-03 ENCOUNTER — Telehealth: Payer: Self-pay | Admitting: Cardiovascular Disease

## 2017-09-03 NOTE — Telephone Encounter (Signed)
I called patient to verify for her that I had discussed the plan w PharmD. Left msg for her to call if any questions.

## 2017-09-03 NOTE — Telephone Encounter (Signed)
New message   Pt c/o medication issue:  1. Name of Medication: Losartan  2. How are you currently taking this medication (dosage and times per day)?   3. Are you having a reaction (difficulty breathing--STAT)? tired  4. What is your medication issue? Pt states she isnt feeling great when she takes this meds and she wants to talk to someone about it. She is more tired and weak.

## 2017-09-03 NOTE — Telephone Encounter (Signed)
due to product recall, patient's valsartan was recently switched to olmesartan by Dr. Jacquiline Doe office.  experiencing feeling tired, and she thinks this has been since undergoing medication change.  notes she takes both her metoprolol and her olmesartan in AM. usually feels tired in the morning. usually feels better in the evening.  She asked if reasonable to switch one of these medications to evening administration, I stated yes, reasonable to switch the olmesartan to evening time starting tomorrow (discussed w Erasmo Downer PharmD who advised agreement w this plan).  Advised her to follow up w [place] in a few days to let us know if the change in administration times helps.

## 2017-10-12 DIAGNOSIS — L900 Lichen sclerosus et atrophicus: Secondary | ICD-10-CM | POA: Diagnosis not present

## 2017-10-12 DIAGNOSIS — L570 Actinic keratosis: Secondary | ICD-10-CM | POA: Diagnosis not present

## 2017-10-12 DIAGNOSIS — M5416 Radiculopathy, lumbar region: Secondary | ICD-10-CM | POA: Diagnosis not present

## 2017-10-12 DIAGNOSIS — L304 Erythema intertrigo: Secondary | ICD-10-CM | POA: Diagnosis not present

## 2017-10-12 DIAGNOSIS — M47816 Spondylosis without myelopathy or radiculopathy, lumbar region: Secondary | ICD-10-CM | POA: Diagnosis not present

## 2017-10-21 DIAGNOSIS — I10 Essential (primary) hypertension: Secondary | ICD-10-CM | POA: Diagnosis not present

## 2017-10-21 DIAGNOSIS — E038 Other specified hypothyroidism: Secondary | ICD-10-CM | POA: Diagnosis not present

## 2017-10-21 DIAGNOSIS — E7849 Other hyperlipidemia: Secondary | ICD-10-CM | POA: Diagnosis not present

## 2017-10-21 DIAGNOSIS — M859 Disorder of bone density and structure, unspecified: Secondary | ICD-10-CM | POA: Diagnosis not present

## 2017-10-28 DIAGNOSIS — Z6834 Body mass index (BMI) 34.0-34.9, adult: Secondary | ICD-10-CM | POA: Diagnosis not present

## 2017-10-28 DIAGNOSIS — Z23 Encounter for immunization: Secondary | ICD-10-CM | POA: Diagnosis not present

## 2017-10-28 DIAGNOSIS — G4700 Insomnia, unspecified: Secondary | ICD-10-CM | POA: Diagnosis not present

## 2017-10-28 DIAGNOSIS — R002 Palpitations: Secondary | ICD-10-CM | POA: Diagnosis not present

## 2017-10-28 DIAGNOSIS — Z0000 Encounter for general adult medical examination without abnormal findings: Secondary | ICD-10-CM | POA: Diagnosis not present

## 2017-10-28 DIAGNOSIS — I350 Nonrheumatic aortic (valve) stenosis: Secondary | ICD-10-CM | POA: Diagnosis not present

## 2017-10-28 DIAGNOSIS — F329 Major depressive disorder, single episode, unspecified: Secondary | ICD-10-CM | POA: Diagnosis not present

## 2017-10-28 DIAGNOSIS — M859 Disorder of bone density and structure, unspecified: Secondary | ICD-10-CM | POA: Diagnosis not present

## 2017-10-28 DIAGNOSIS — E7849 Other hyperlipidemia: Secondary | ICD-10-CM | POA: Diagnosis not present

## 2017-10-28 DIAGNOSIS — I25810 Atherosclerosis of coronary artery bypass graft(s) without angina pectoris: Secondary | ICD-10-CM | POA: Diagnosis not present

## 2017-10-28 DIAGNOSIS — I10 Essential (primary) hypertension: Secondary | ICD-10-CM | POA: Diagnosis not present

## 2017-11-04 ENCOUNTER — Telehealth: Payer: Self-pay | Admitting: Cardiovascular Disease

## 2017-11-04 NOTE — Telephone Encounter (Signed)
Spoke with pt, she has recently been feeling weak. She reports it is different from being tired. She has seen her medical doctor and reports all her blood work was fine, her HGB was a little low. She is now taking olmesartan instead of valsartan and wonders if that maybe the cause. She was wanting an appointment to see someone and is happy with appt on the 16th with the APP. She will call prior to appt with concerns.

## 2017-11-04 NOTE — Telephone Encounter (Signed)
New message   Patient calling regarding weakness, wants to know if medication is causing weakness.  Has spoken to PCP about bloodwork, but feels cardiologist may have more information on echo. Please call

## 2017-11-10 DIAGNOSIS — I872 Venous insufficiency (chronic) (peripheral): Secondary | ICD-10-CM | POA: Diagnosis not present

## 2017-11-10 DIAGNOSIS — D692 Other nonthrombocytopenic purpura: Secondary | ICD-10-CM | POA: Diagnosis not present

## 2017-11-10 DIAGNOSIS — L570 Actinic keratosis: Secondary | ICD-10-CM | POA: Diagnosis not present

## 2017-11-12 ENCOUNTER — Ambulatory Visit (INDEPENDENT_AMBULATORY_CARE_PROVIDER_SITE_OTHER): Payer: Medicare Other | Admitting: Cardiology

## 2017-11-12 ENCOUNTER — Encounter: Payer: Self-pay | Admitting: Cardiology

## 2017-11-12 VITALS — BP 132/78 | HR 89 | Ht 62.0 in | Wt 197.0 lb

## 2017-11-12 DIAGNOSIS — I493 Ventricular premature depolarization: Secondary | ICD-10-CM

## 2017-11-12 DIAGNOSIS — I10 Essential (primary) hypertension: Secondary | ICD-10-CM | POA: Diagnosis not present

## 2017-11-12 DIAGNOSIS — R0600 Dyspnea, unspecified: Secondary | ICD-10-CM | POA: Diagnosis not present

## 2017-11-12 DIAGNOSIS — Z951 Presence of aortocoronary bypass graft: Secondary | ICD-10-CM

## 2017-11-12 DIAGNOSIS — I350 Nonrheumatic aortic (valve) stenosis: Secondary | ICD-10-CM | POA: Diagnosis not present

## 2017-11-12 DIAGNOSIS — R0609 Other forms of dyspnea: Secondary | ICD-10-CM

## 2017-11-12 MED ORDER — ISOSORBIDE MONONITRATE ER 30 MG PO TB24: 15.0000 mg | ORAL_TABLET | Freq: Every day | ORAL | 3 refills | Status: DC

## 2017-11-12 NOTE — Assessment & Plan Note (Signed)
CABG x 3 by Dr. Prescott Gum, LIMA to LAD, SVG to intermediate and obtuse marginal, SVG to the PD and PL Myoview in 2013 showed small apical ischemia- low risk

## 2017-11-12 NOTE — Assessment & Plan Note (Signed)
Symptomatic, seen by Dr Rayann Heman in the past- beta blocker

## 2017-11-12 NOTE — Assessment & Plan Note (Signed)
Controlled.  

## 2017-11-12 NOTE — Patient Instructions (Addendum)
Medication Instructions:  1. START IMDUR 30 MG TABLET WITH THE DIRECTIONS TO READ: TAKE 1/2 TABLET DAILY = 15 MG DAILY; RX HAS BEEN SENT IN; YOU CAN TAKE THIS MEDICATION IN THE MORNING  Labwork: NONE ORDERED TODAY  Testing/Procedures: NONE ORDERED TODAY  Follow-Up: 01/12/18 @ 10 AM WITH DR. [name]   Any Other Special Instructions Will Be Listed Below (If Applicable).     If you need a refill on your cardiac medications before your next appointment, please call your pharmacy.

## 2017-11-12 NOTE — Assessment & Plan Note (Signed)
Seen today for evaluation of DOE and mild fatigue for the past few months

## 2017-11-12 NOTE — Assessment & Plan Note (Signed)
Mild AS with normal LVF by echo feb 2018

## 2017-11-12 NOTE — Progress Notes (Signed)
11/12/2017 Kathryn Carlson   Dec 14, 1927  [id number]  Primary Physician Burnard Bunting, MD Primary Cardiologist: Dr [name]  HPI:  Pleasant 83 y/o female, resident of [facility], with a history of a remote MI at age 24. Dr Maryanna Shape would come to her home for check ups then. She eventually had CABG in 1997 and has done remarkably well since. A Myoview in 2013 showed a small area of anterior ischemia but overall was felt to be low risk. She has not had angina.  She has had problems with frequent PVCs and is on beta blocker Rx for this. An echo in Feb 2018 showed mild AS with normal LVF and moderate pulmonary HTN-PA 47 mmHg.  She is sen in the office today for evaluation of DOE. The pt tells me that people have remarked that she appears SOB at times. The pt denies any chest pain or tightness. She denies orthopnea or edema. She admits she feels a little "winded" after walking from the parking lot to her apartment at [facility]. She saw Dr Reynaldo Minium and lab work was obtained, it all looks WNL. She brought in B/P and HR readings from home and these look stable as well.    Current Outpatient Medications  Medication Sig Dispense Refill  . aspirin 81 MG tablet Take 162 mg by mouth daily.     Marland Kitchen atorvastatin (LIPITOR) 40 MG tablet Take 40 mg by mouth daily.    . chloridazePOXIDE-amitriptyline (LIMBITROL) 5-12.5 MG per tablet Take 1 tablet by mouth daily.    . cholecalciferol (VITAMIN D) 1000 UNITS tablet Take 2,000 Units by mouth daily.    Marland Kitchen estradiol (ESTRACE) 1 MG tablet Take 1 mg by mouth daily.    . ISTALOL 0.5 % SOLN Place 1 drop into the right eye daily.    Marland Kitchen levothyroxine (SYNTHROID, LEVOTHROID) 75 MCG tablet Take 75 mcg by mouth daily before breakfast.    . metoprolol succinate (TOPROL-XL) 50 MG 24 hr tablet Take 50 mg by mouth daily.  5  . Multiple Vitamin (MULTIVITAMIN) capsule Take 1 capsule by mouth daily.    . niacin 500 MG tablet Take 500 mg by mouth daily with  breakfast.    . olmesartan (BENICAR) 40 MG tablet Take 40 mg daily by mouth.    . pantoprazole (PROTONIX) 40 MG tablet TAKE 1 TABLET BY MOUTH EVERY DAY 90 tablet 3  . VITAMIN E PO Take 400 mg by mouth daily. Plus D    . zolpidem (AMBIEN) 10 MG tablet Take 1 tablet by mouth as directed.  3  . isosorbide mononitrate (IMDUR) 30 MG 24 hr tablet Take 0.5 tablets (15 mg total) daily by mouth. 90 tablet 3   Current Facility-Administered Medications  Medication Dose Route Frequency Provider Last Rate Last Dose  . triamcinolone acetonide (KENALOG-40) injection 40 mg  40 mg Intramuscular Once Roselee Culver, MD        Allergies  Allergen Reactions  . Novocain [Procaine]     Past Medical History:  Diagnosis Date  . CAD (coronary artery disease)    possible ant wall MI ('97) - cath & CABG x5  . Exogenous obesity   . GERD (gastroesophageal reflux disease)   . History of nuclear stress test 09/2012   lexiscan; mild perfusion defect in apical anterior & apical region (infarct/scar) - no significant ischemia, low risk   . History of total bilateral knee replacement   . Hypertension   . Hypothyroidism   .  PVC's (premature ventricular contractions)   . Squamous cell carcinoma of scalp    removed - Dr. Denna Haggard    Social History   Socioeconomic History  . Marital status: Widowed    Spouse name: Not on file  . Number of children: 4  . Years of education: 67  . Highest education level: Not on file  Social Needs  . Financial resource strain: Not on file  . Food insecurity - worry: Not on file  . Food insecurity - inability: Not on file  . Transportation needs - medical: Not on file  . Transportation needs - non-medical: Not on file  Occupational History  . Not on file  Tobacco Use  . Smoking status: Never Smoker  . Smokeless tobacco: Never Used  Substance and Sexual Activity  . Alcohol use: Yes    Comment: "a drink of wine every now and then"  . Drug use: Not on file  . Sexual  activity: Not on file  Other Topics Concern  . Not on file  Social History Narrative   Lives in [place]     Family History  Problem Relation Age of Onset  . Heart attack Mother   . Heart attack Father   . Cancer Sister   . Diabetes Sister   . Heart Problems Sister      Review of Systems: General: negative for chills, fever, night sweats or weight changes.  Cardiovascular: negative for chest pain, edema, orthopnea, palpitations, paroxysmal nocturnal dyspnea or shortness of breath Dermatological: negative for rash Respiratory: negative for cough or wheezing Urologic: negative for hematuria Abdominal: negative for nausea, vomiting, diarrhea, bright red blood per rectum, melena, or hematemesis Neurologic: negative for visual changes, syncope, or dizziness All other systems reviewed and are otherwise negative except as noted above.    Blood pressure 132/78, pulse 89, height 5\' 2"  (1.575 m), weight 197 lb (89.4 kg).  General appearance: alert, cooperative, no distress and mildly obese Neck: no JVD Lungs: clear to auscultation bilaterally Heart: regular rate and rhythm and 2/6 AS murmur Extremities: no edema Skin: Skin color, texture, turgor normal. No rashes or lesions Neurologic: Grossly normal  EKG NSR, frequent PVCs  ASSESSMENT AND PLAN:   DOE (dyspnea on exertion) Seen today for evaluation of DOE and mild fatigue for the past few months  S/P CABG x 3 CABG x 3 by Dr. Prescott Gum, LIMA to LAD, SVG to intermediate and obtuse marginal, SVG to the PD and PL Myoview in 2013 showed small apical ischemia- low risk  HTN (hypertension) Controlled  Frequent PVCs Symptomatic, seen by Dr Rayann Heman in the past- beta blocker  Aortic stenosis, mild Mild AS with normal LVF by echo feb 2018   PLAN  I told her I was concerned her symptoms could be an angina equivalent. We discussed options for further evaluation. She does not want any aggressive work up at this time and I did not  discourage her. I suggested we try adding low dose Imdur and see if it helps. She'll see Dr [name] after the first of the year. She knows to contact us if she has chest pain, increased DOE, syncope, or near syncope.  Kerin Ransom PA-C 11/12/2017 10:27 AM

## 2017-11-25 DIAGNOSIS — L304 Erythema intertrigo: Secondary | ICD-10-CM | POA: Diagnosis not present

## 2017-11-29 ENCOUNTER — Telehealth: Payer: Self-pay | Admitting: Cardiovascular Disease

## 2017-11-29 NOTE — Telephone Encounter (Signed)
Pt c/o BP issue: STAT if pt c/o blurred vision, one-sided weakness or slurred speech  1. What are your last 5 BP readings?  2. Are you having any other symptoms (ex. Dizziness, headache, blurred vision, passed out)? Could not explain symptoms  3. What is your BP issue? Running high

## 2017-11-29 NOTE — Telephone Encounter (Signed)
Returned the call to the patient. She stated that she is still having feelings of being tired. She had a nurse take her blood pressure yesterday at her facility and it was 162/78. This was around 3 in the afternoon. She denies chest pain or shortness of breath.  She currently takes Imdur and Metoprolol in the morning and Olmesartan in the evening. She has been instructed to ask a nurse to check her blood pressure today and she will call back later this afternoon with the results.

## 2017-11-29 NOTE — Telephone Encounter (Signed)
Incoming call from the patient. She stated that her blood pressure is 140/70 and her heart rate was 57. The patient is worried about her blood pressure being elevated. She has been informed that it has decreased since yesterday but she is still concerned. She would like an appointment as soon as possible. One has been made for tomorrow at 2pm with Kerin Ransom, [place]. She verbalized her understanding.

## 2017-11-29 NOTE — Telephone Encounter (Signed)
Kathryn Carlson is calling back and is wanting to speak with you

## 2017-11-30 ENCOUNTER — Encounter: Payer: Self-pay | Admitting: Cardiology

## 2017-11-30 ENCOUNTER — Ambulatory Visit (INDEPENDENT_AMBULATORY_CARE_PROVIDER_SITE_OTHER): Payer: Medicare Other | Admitting: Cardiology

## 2017-11-30 VITALS — BP 141/77 | HR 78 | Ht 62.5 in | Wt 199.0 lb

## 2017-11-30 DIAGNOSIS — I350 Nonrheumatic aortic (valve) stenosis: Secondary | ICD-10-CM | POA: Diagnosis not present

## 2017-11-30 DIAGNOSIS — Z951 Presence of aortocoronary bypass graft: Secondary | ICD-10-CM

## 2017-11-30 DIAGNOSIS — I10 Essential (primary) hypertension: Secondary | ICD-10-CM | POA: Diagnosis not present

## 2017-11-30 DIAGNOSIS — E669 Obesity, unspecified: Secondary | ICD-10-CM

## 2017-11-30 DIAGNOSIS — I493 Ventricular premature depolarization: Secondary | ICD-10-CM | POA: Diagnosis not present

## 2017-11-30 DIAGNOSIS — R5383 Other fatigue: Secondary | ICD-10-CM | POA: Insufficient documentation

## 2017-11-30 DIAGNOSIS — R0609 Other forms of dyspnea: Secondary | ICD-10-CM | POA: Diagnosis not present

## 2017-11-30 NOTE — Progress Notes (Signed)
11/30/2017 Kathryn Carlson   02-22-1927  [id number]  Primary Physician Burnard Bunting, MD Primary Cardiologist: Dr [name]  HPI:  Pleasant 83 y/o female, resident of [facility], with a history of a remote MI at age 68. Dr Maryanna Shape would come to her home for check ups then. She eventually had CABG in 1997 and has done remarkably well since. A Myoview in 2013 showed a small area of anterior ischemia but overall was felt to be low risk. She has not had angina.  She has had problems with frequent PVCs and is on beta blocker Rx for this. An echo in Feb 2018 showed mild AS with normal LVF and moderate pulmonary HTN-PA 47 mmHg. I just saw her in the office 11/12/17. She complained of some DOE. I added low dose Nitrate, concerned that might be an anginal equivalent. This past Sunday at church she says she felt "unsteady". [facility] from [facility]. Her B/P was initially high- 178/80. Her HR was 55. Later her B/P came down to 160/78, HR 70. She was reassured that those B/P reading would most likely not cause her to have symptoms. She feels better today- 140/70, HR 78. She denies chest pain or unusual SOB.     Current Outpatient Medications  Medication Sig Dispense Refill  . aspirin 81 MG tablet Take 162 mg by mouth daily.     Marland Kitchen atorvastatin (LIPITOR) 40 MG tablet Take 40 mg by mouth daily.    . chloridazePOXIDE-amitriptyline (LIMBITROL) 5-12.5 MG per tablet Take 1 tablet by mouth daily.    . cholecalciferol (VITAMIN D) 1000 UNITS tablet Take 2,000 Units by mouth daily.    Marland Kitchen estradiol (ESTRACE) 1 MG tablet Take 1 mg by mouth daily.    . hydrocortisone 2.5 % cream Apply 1 application topically daily as needed.    . isosorbide mononitrate (IMDUR) 30 MG 24 hr tablet Take 0.5 tablets (15 mg total) daily by mouth. 90 tablet 3  . levothyroxine (SYNTHROID, LEVOTHROID) 75 MCG tablet Take 75 mcg by mouth daily before breakfast.    . metoprolol  succinate (TOPROL-XL) 50 MG 24 hr tablet Take 50 mg by mouth daily.  5  . Multiple Vitamin (MULTIVITAMIN) capsule Take 1 capsule by mouth daily.    . niacin 500 MG tablet Take 500 mg by mouth daily with breakfast.    . olmesartan (BENICAR) 40 MG tablet Take 40 mg daily by mouth.    . pantoprazole (PROTONIX) 40 MG tablet TAKE 1 TABLET BY MOUTH EVERY DAY 90 tablet 3  . TIMOLOL HEMIHYDRATE OP Place 1 drop into the right eye daily.    Marland Kitchen VITAMIN E PO Take 400 mg by mouth daily. Plus D    . zolpidem (AMBIEN) 10 MG tablet Take 1 tablet by mouth as directed.  3   Current Facility-Administered Medications  Medication Dose Route Frequency Provider Last Rate Last Dose  . triamcinolone acetonide (KENALOG-40) injection 40 mg  40 mg Intramuscular Once Roselee Culver, MD        Allergies  Allergen Reactions  . Novocain [Procaine]     Past Medical History:  Diagnosis Date  . CAD (coronary artery disease)    possible ant wall MI ('97) - cath & CABG x5  . Exogenous obesity   . GERD (gastroesophageal reflux disease)   . History of nuclear stress test 09/2012   lexiscan; mild perfusion defect in apical anterior &  apical region (infarct/scar) - no significant ischemia, low risk   . History of total bilateral knee replacement   . Hypertension   . Hypothyroidism   . PVC's (premature ventricular contractions)   . Squamous cell carcinoma of scalp    removed - Dr. Denna Haggard    Social History   Socioeconomic History  . Marital status: Widowed    Spouse name: Not on file  . Number of children: 4  . Years of education: 14  . Highest education level: Not on file  Social Needs  . Financial resource strain: Not on file  . Food insecurity - worry: Not on file  . Food insecurity - inability: Not on file  . Transportation needs - medical: Not on file  . Transportation needs - non-medical: Not on file  Occupational History  . Not on file  Tobacco Use  . Smoking status: Never Smoker  . Smokeless  tobacco: Never Used  Substance and Sexual Activity  . Alcohol use: Yes    Comment: "a drink of wine every now and then"  . Drug use: Not on file  . Sexual activity: Not on file  Other Topics Concern  . Not on file  Social History Narrative   Lives in [place]     Family History  Problem Relation Age of Onset  . Heart attack Mother   . Heart attack Father   . Cancer Sister   . Diabetes Sister   . Heart Problems Sister      Review of Systems: General: negative for chills, fever, night sweats or weight changes.  Cardiovascular: negative for chest pain, dyspnea on exertion, edema, orthopnea, palpitations, paroxysmal nocturnal dyspnea or shortness of breath Dermatological: negative for rash Respiratory: negative for cough or wheezing Urologic: negative for hematuria Abdominal: negative for nausea, vomiting, diarrhea, bright red blood per rectum, melena, or hematemesis Neurologic: negative for visual changes, syncope, or dizziness All other systems reviewed and are otherwise negative except as noted above.    Blood pressure (!) 141/77, pulse 78, height 5' 2.5" (1.588 m), weight 199 lb (90.3 kg).  General appearance: alert, cooperative, no distress and moderately obese Lungs: clear to auscultation bilaterally Heart: regular rate and rhythm and 2/6 AS murmur Extremities: extremities normal, atraumatic, no cyanosis or edema Skin: Skin color, texture, turgor normal. No rashes or lesions Neurologic: Grossly normal  EKG NSR, one PVC  ASSESSMENT AND PLAN:   DOE (dyspnea on exertion) No CHF on exam  Fatigue Transient. She has had symptomatic  Bradycardia in the past. If she has recurrent episodes of fatigue I would decrease her Toprol to 25 mg  Frequent PVCs Symptomatic, seen by Dr Rayann Heman in the past- on beta blocker therapy for this  HTN (hypertension) Stable- I did not change her medications  S/P CABG x 3 CABG x 3 '97 by Dr. Prescott Gum, LIMA to LAD, SVG to intermediate  and obtuse marginal, SVG to the PD and PL Myoview low risk 2013  Aortic stenosis, mild Mild AS with normal LVF by echo feb 2018  Obesity (BMI 30.0-34.9) BMI 35   PLAN  I made no changes today. If she has recurrent episodes of "unsteadiness" I would decrease her Toprol to 25 mg and get a Holter.   Kerin Ransom PA-C 11/30/2017 3:44 PM

## 2017-11-30 NOTE — Assessment & Plan Note (Signed)
Stable- I did not change her medications

## 2017-11-30 NOTE — Assessment & Plan Note (Signed)
Symptomatic, seen by Dr Rayann Heman in the past- on beta blocker therapy for this

## 2017-11-30 NOTE — Assessment & Plan Note (Signed)
Mild AS with normal LVF by echo feb 2018

## 2017-11-30 NOTE — Assessment & Plan Note (Signed)
Transient. She has had symptomatic  Bradycardia in the past. If she has recurrent episodes of fatigue I would decrease her Toprol to 25 mg

## 2017-11-30 NOTE — Assessment & Plan Note (Signed)
No CHF on exam

## 2017-11-30 NOTE — Assessment & Plan Note (Signed)
BMI 35 

## 2017-11-30 NOTE — Patient Instructions (Signed)
Medication Instructions:   NO CHANGE  Testing/Procedures:  Your physician has requested that you have an echocardiogram. Echocardiography is a painless test that uses sound waves to create images of your heart. It provides your doctor with information about the size and shape of your heart and how well your heart's chambers and valves are working. This procedure takes approximately one hour. There are no restrictions for this procedure.MOVE TO THE END OF FEBRUARY     Follow-Up:  Your physician recommends that you schedule a follow-up appointment in: WITH DR BERRY AFTER ECHO COMPLETE   If you need a refill on your cardiac medications before your next appointment, please call your pharmacy.

## 2017-11-30 NOTE — Assessment & Plan Note (Addendum)
CABG x 3 '97 by Dr. Prescott Gum, LIMA to LAD, SVG to intermediate and obtuse marginal, SVG to the PD and PL Myoview low risk 2013

## 2017-12-02 ENCOUNTER — Telehealth: Payer: Self-pay | Admitting: Cardiovascular Disease

## 2017-12-02 NOTE — Telephone Encounter (Signed)
New message  Patient wants to discuss time of day to take medication. Feels dizzy when she takes in the am. Please call  Pt c/o medication issue:  1. Name of Medication: isosorbide mononitrate (IMDUR) 30 MG 24 hr tablet  2. How are you currently taking this medication (dosage and times per day)? N/a   3. Are you having a reaction (difficulty breathing--STAT)? No  4. What is your medication issue? Unsure when to take medication

## 2017-12-02 NOTE — Telephone Encounter (Signed)
Spoke with pt, she has noticed a weak feeling in the morning after she takes the metoprolol and isosorbide together. She feels her bp maybe getting too low with the combination. Okay given for the patient to take the isosorbide at bedtime.

## 2018-01-04 DIAGNOSIS — S39012A Strain of muscle, fascia and tendon of lower back, initial encounter: Secondary | ICD-10-CM | POA: Diagnosis not present

## 2018-01-12 ENCOUNTER — Ambulatory Visit: Payer: Medicare Other | Admitting: Cardiovascular Disease

## 2018-01-14 DIAGNOSIS — L02511 Cutaneous abscess of right hand: Secondary | ICD-10-CM | POA: Diagnosis not present

## 2018-01-14 DIAGNOSIS — L0291 Cutaneous abscess, unspecified: Secondary | ICD-10-CM | POA: Diagnosis not present

## 2018-01-14 DIAGNOSIS — M47816 Spondylosis without myelopathy or radiculopathy, lumbar region: Secondary | ICD-10-CM | POA: Diagnosis not present

## 2018-01-14 DIAGNOSIS — I2510 Atherosclerotic heart disease of native coronary artery without angina pectoris: Secondary | ICD-10-CM | POA: Diagnosis not present

## 2018-01-14 DIAGNOSIS — L0390 Cellulitis, unspecified: Secondary | ICD-10-CM | POA: Diagnosis not present

## 2018-01-17 DIAGNOSIS — L0390 Cellulitis, unspecified: Secondary | ICD-10-CM | POA: Diagnosis not present

## 2018-01-17 DIAGNOSIS — L0291 Cutaneous abscess, unspecified: Secondary | ICD-10-CM | POA: Diagnosis not present

## 2018-01-17 DIAGNOSIS — L02511 Cutaneous abscess of right hand: Secondary | ICD-10-CM | POA: Diagnosis not present

## 2018-01-17 DIAGNOSIS — I2510 Atherosclerotic heart disease of native coronary artery without angina pectoris: Secondary | ICD-10-CM | POA: Diagnosis not present

## 2018-01-25 ENCOUNTER — Ambulatory Visit (INDEPENDENT_AMBULATORY_CARE_PROVIDER_SITE_OTHER): Payer: Medicare Other | Admitting: Physician Assistant

## 2018-01-25 ENCOUNTER — Encounter: Payer: Self-pay | Admitting: Physician Assistant

## 2018-01-25 VITALS — BP 133/77 | HR 85 | Ht 65.0 in | Wt 194.0 lb

## 2018-01-25 DIAGNOSIS — I10 Essential (primary) hypertension: Secondary | ICD-10-CM | POA: Diagnosis not present

## 2018-01-25 DIAGNOSIS — R531 Weakness: Secondary | ICD-10-CM

## 2018-01-25 DIAGNOSIS — I25810 Atherosclerosis of coronary artery bypass graft(s) without angina pectoris: Secondary | ICD-10-CM

## 2018-01-25 DIAGNOSIS — E785 Hyperlipidemia, unspecified: Secondary | ICD-10-CM | POA: Diagnosis not present

## 2018-01-25 DIAGNOSIS — E039 Hypothyroidism, unspecified: Secondary | ICD-10-CM | POA: Diagnosis not present

## 2018-01-25 LAB — BASIC METABOLIC PANEL
BUN/Creatinine Ratio: 20 (ref 12–28)
BUN: 31 mg/dL — ABNORMAL HIGH (ref 8–27)
CHLORIDE: 100 mmol/L (ref 96–106)
CO2: 18 mmol/L — AB (ref 20–29)
Calcium: 9.4 mg/dL (ref 8.7–10.3)
Creatinine, Ser: 1.52 mg/dL — ABNORMAL HIGH (ref 0.57–1.00)
GFR calc Af Amer: 35 mL/min/{1.73_m2} — ABNORMAL LOW (ref >59)
GFR calc non Af Amer: 30 mL/min/{1.73_m2} — ABNORMAL LOW (ref >59)
GLUCOSE: 108 mg/dL — AB (ref 65–99)
POTASSIUM: 5.9 mmol/L — AB (ref 3.5–5.2)
Sodium: 134 mmol/L (ref 134–144)

## 2018-01-25 LAB — CBC
Hematocrit: 33.8 % — ABNORMAL LOW (ref 34.0–46.6)
Hemoglobin: 11.2 g/dL (ref 11.1–15.9)
MCH: 32.5 pg (ref 26.6–33.0)
MCHC: 33.1 g/dL (ref 31.5–35.7)
MCV: 98 fL — ABNORMAL HIGH (ref 79–97)
PLATELETS: 313 10*3/uL (ref 150–379)
RBC: 3.45 x10E6/uL — AB (ref 3.77–5.28)
RDW: 13.6 % (ref 12.3–15.4)
WBC: 8.9 10*3/uL (ref 3.4–10.8)

## 2018-01-25 LAB — TSH: TSH: 3.59 u[IU]/mL (ref 0.450–4.500)

## 2018-01-25 MED ORDER — ATORVASTATIN CALCIUM 80 MG PO TABS: 80.0000 mg | ORAL_TABLET | Freq: Every day | ORAL | 5 refills | Status: DC

## 2018-01-25 NOTE — Patient Instructions (Signed)
Medication Instructions:  INCREASE Lipitor to 80 mg once a day  Labwork: Your physician recommends that you return for lab work in: TODAY- CBC, TSH, BMET  Your physician recommends that you return for lab work in: complete Fasting labs in March with PCP-LFT and Lipid  Testing/Procedures: ECHO has already been scheduled see if patient can get a sooner appointment   Follow-Up: Keep upcoming appointment with Dr [name] as scheduled  Any Other Special Instructions Will Be Listed Below (If Applicable). If you need a refill on your cardiac medications before your next appointment, please call your pharmacy.

## 2018-01-25 NOTE — Progress Notes (Addendum)
Cardiology Office Note    Date:  01/26/2018   ID:  JEWELINE REIF, DOB January 12, 1927, MRN [id number]  PCP:  Burnard Bunting, MD  Cardiologist:  Dr. Dellia Beckwith PA-C  Chief Complaint  Patient presents with  . Follow-up    feeling real weak    History of Present Illness:  Kathryn Carlson is a 84 y.o. female with PMH of CAD s/p CABG x 5, GERD, HTN and hypothyroidism.  [facility]  She has a remote history of MI at age 33 and eventually had a CABG in 1997.  Last Myoview in 2013 showed a small area of anterior ischemia but overall felt to be low risk.  She has not had any anginal symptoms.  She has had problems with frequent PVCs and has been taking beta-blocker for this.  Echocardiogram in February 2018 showed mild AS/mild-moderate AI with normal LVEF and moderate pulmonary hypertension with PA peak pressure 47 mmHg.  She has been seen in October and November 2018 by Kerin Ransom PA-C for dyspnea on exertion.  Nitrate was initially added due to concern that her symptoms might be anginal equivalent.  She was last seen in the office on 11/30/2017, it was recommended for her to have a echocardiogram prior to her next appointment with Dr. [name].  Patient presents today for cardiology office visit for evaluation weakness.  She denies any chest pain or back pain.  She says her original anginal symptom was severe back pain, she has not experienced that recently even with exertion.  She still have dyspnea on exertion but attributed to her age.  For the past 3 weeks, she has been noticing worsening weakness especially noticeable in the lower extremity however no pain.  She says this occurred after she started taking Bactrim for a finger infection.  She is also dealing with back pain in the left lower extremity neuropathy.  She says that she lives on the fifth floor of the [facility] living and walk to [employer] on a daily basis.  She does not  have any weakness at rest, only noticeable during ambulation.  She has not been exercising that much and largely been sedentary.  I encouraged her to increase activity.  As far as weakness, I am not convinced this is purely cardiac related.  I will obtain CBC, TSH and a basic metabolic panel.  Last lab in October was normal.  However TSH was near upper limit of normal.  I also recommended move up the echocardiogram to a earlier date.    Past Medical History:  Diagnosis Date  . CAD (coronary artery disease)    possible ant wall MI ('97) - cath & CABG x5  . Exogenous obesity   . GERD (gastroesophageal reflux disease)   . History of nuclear stress test 09/2012   lexiscan; mild perfusion defect in apical anterior & apical region (infarct/scar) - no significant ischemia, low risk   . History of total bilateral knee replacement   . Hypertension   . Hypothyroidism   . PVC's (premature ventricular contractions)   . Squamous cell carcinoma of scalp    removed - Dr. Denna Haggard    Past Surgical History:  Procedure Laterality Date  . ABDOMINAL HYSTERECTOMY  1984  . APPENDECTOMY  1941  . Carotid Doppler  12/07/2012   R & L ICA - 0-49% diameter reduction  . CORONARY ARTERY BYPASS GRAFT  09/1996   cath & CABG x5  LIMA-LAD, SVG-sequential OD & OM, SVG sequential to PDA & PLA (Dr. Prescott Gum)  . REPLACEMENT TOTAL KNEE Bilateral 2001 & 2003  . TONSILLECTOMY    . TRANSTHORACIC ECHOCARDIOGRAM  08/23/2013   EF 50-55%, LV cavity size mod reduced, mild LVH, mild conc hypertrophy; mild AV stenosis & mild regurg; mild MR - ordered for murmur    Current Medications: Outpatient Medications Prior to Visit  Medication Sig Dispense Refill  . aspirin 81 MG tablet Take 162 mg by mouth daily.     . chloridazePOXIDE-amitriptyline (LIMBITROL) 5-12.5 MG per tablet Take 1 tablet by mouth daily.    . cholecalciferol (VITAMIN D) 1000 UNITS tablet Take 2,000 Units by mouth daily.    Marland Kitchen estradiol (ESTRACE) 1 MG tablet Take 1  mg by mouth daily.    . hydrocortisone 2.5 % cream Apply 1 application topically daily as needed.    . isosorbide mononitrate (IMDUR) 30 MG 24 hr tablet Take 0.5 tablets (15 mg total) daily by mouth. 90 tablet 3  . levothyroxine (SYNTHROID, LEVOTHROID) 75 MCG tablet Take 75 mcg by mouth daily before breakfast.    . metoprolol succinate (TOPROL-XL) 50 MG 24 hr tablet Take 50 mg by mouth daily.  5  . Multiple Vitamin (MULTIVITAMIN) capsule Take 1 capsule by mouth daily.    . niacin 500 MG tablet Take 500 mg by mouth daily with breakfast.    . olmesartan (BENICAR) 40 MG tablet Take 40 mg daily by mouth.    . pantoprazole (PROTONIX) 40 MG tablet TAKE 1 TABLET BY MOUTH EVERY DAY 90 tablet 3  . TIMOLOL HEMIHYDRATE OP Place 1 drop into the right eye daily.    Marland Kitchen VITAMIN E PO Take 400 mg by mouth daily. Plus D    . zolpidem (AMBIEN) 10 MG tablet Take 1 tablet by mouth as directed.  3  . atorvastatin (LIPITOR) 40 MG tablet Take 40 mg by mouth daily.     Facility-Administered Medications Prior to Visit  Medication Dose Route Frequency Provider Last Rate Last Dose  . triamcinolone acetonide (KENALOG-40) injection 40 mg  40 mg Intramuscular Once Roselee Culver, MD         Allergies:   Novocain [procaine]   Social History   Socioeconomic History  . Marital status: Widowed    Spouse name: None  . Number of children: 4  . Years of education: 16  . Highest education level: None  Social Needs  . Financial resource strain: None  . Food insecurity - worry: None  . Food insecurity - inability: None  . Transportation needs - medical: None  . Transportation needs - non-medical: None  Occupational History  . None  Tobacco Use  . Smoking status: Never Smoker  . Smokeless tobacco: Never Used  Substance and Sexual Activity  . Alcohol use: Yes    Comment: "a drink of wine every now and then"  . Drug use: None  . Sexual activity: None  Other Topics Concern  . None  Social History Narrative    Lives in [place]     Family History:  The patient's family history includes Cancer in her sister; Diabetes in her sister; Heart Problems in her sister; Heart attack in her father and mother.   ROS:   Please see the history of present illness.    ROS All other systems reviewed and are negative.   PHYSICAL EXAM:   VS:  BP 133/77   Pulse 85   Ht 5\' 5"  (  1.651 m)   Wt 194 lb (88 kg)   BMI 32.28 kg/m    GEN: Well nourished, well developed, in no acute distress  HEENT: normal  Neck: no JVD, carotid bruits, or masses Cardiac: RRR; no murmurs, rubs, or gallops,no edema  Respiratory:  clear to auscultation bilaterally, normal work of breathing GI: soft, nontender, nondistended, + BS MS: no deformity or atrophy  Skin: warm and dry, no rash Neuro:  Alert and Oriented x 3, Strength and sensation are intact Psych: euthymic mood, full affect  Wt Readings from Last 3 Encounters:  01/25/18 194 lb (88 kg)  11/30/17 199 lb (90.3 kg)  11/12/17 197 lb (89.4 kg)      Studies/Labs Reviewed:   EKG:  EKG is ordered today.  The ekg ordered today demonstrates normal sinus rhythm, no significant ST-T wave changes.  Unchanged when compared to the previous EKG in December 2018.  Recent Labs: 03/03/2017: ALT 14 01/25/2018: BUN 31; Creatinine, Ser 1.52; Hemoglobin 11.2; Platelets 313; Potassium 5.9; Sodium 134; TSH 3.590   Lipid Panel    Component Value Date/Time   CHOL 151 03/03/2017 0842   TRIG 233 (H) 03/03/2017 0842   HDL 42 (L) 03/03/2017 0842   CHOLHDL 3.6 03/03/2017 0842   VLDL 47 (H) 03/03/2017 0842   LDLCALC 62 03/03/2017 0842    Additional studies/ records that were reviewed today include:   Cath 10/23/1996      Echo 02/24/2017 LV EF: 55% -   60%  Study Conclusions  - Left ventricle: The cavity size was normal. Wall thickness was   normal. Systolic function was normal. The estimated ejection   fraction was in the range of 55% to 60%. Wall motion was normal;   there  were no regional wall motion abnormalities. Features are   consistent with a pseudonormal left ventricular filling pattern,   with concomitant abnormal relaxation and increased filling   pressure (grade 2 diastolic dysfunction). - Aortic valve: Mildly to moderately calcified annulus. Mildly   thickened, mildly calcified leaflets. There was mild stenosis.   There was mild to moderate regurgitation. Mean gradient (S): 12   mm Hg. Peak gradient (S): 22 mm Hg. - Mitral valve: Moderately calcified annulus. There was mild   regurgitation. - Pulmonary arteries: Systolic pressure was moderately increased.   PA peak pressure: 47 mm Hg (S).    ASSESSMENT:    1. Weakness   2. Coronary artery disease involving coronary bypass graft of native heart without angina pectoris   3. Essential hypertension   4. Hypothyroidism, unspecified type   5. Hyperlipidemia, unspecified hyperlipidemia type      PLAN:  In order of problems listed above:  1. Weakness: Denies any obvious anginal symptom.  She explains her lower leg appears to be weak.  I think we need to rule out secondary causes first.  I will obtain CBC, TSH and BMET.  I also recommended move up her scheduled echocardiogram to a earlier date.  2. CAD s/p CABG: Remote history of bypass surgery, however she denies any exertional back pain like what she experienced prior to the CABG continue aspirin, Lipitor 80 mg daily and metoprolol succinate.  3. Hypertension: Blood pressure stable on current medication.  On metoprolol succinate, Imdur, and Benicar.  4. Hyperlipidemia: LDL not at goal, increase Lipitor to 80 mg daily.  Fasting lipid panel and LFT in 6-8 weeks at primary care providers office.  5. Hypothyroidism: Managed by primary care provider.    Medication Adjustments/Labs  and Tests Ordered: Current medicines are reviewed at length with the patient today.  Concerns regarding medicines are outlined above.  Medication changes, Labs and  Tests ordered today are listed in the Patient Instructions below. Patient Instructions  Medication Instructions:  INCREASE Lipitor to 80 mg once a day  Labwork: Your physician recommends that you return for lab work in: TODAY- CBC, TSH, BMET  Your physician recommends that you return for lab work in: complete Fasting labs in March with PCP-LFT and Lipid  Testing/Procedures: ECHO has already been scheduled see if patient can get a sooner appointment   Follow-Up: Keep upcoming appointment with Dr [name] as scheduled  Any Other Special Instructions Will Be Listed Below (If Applicable). If you need a refill on your cardiac medications before your next appointment, please call your pharmacy.     Kathryn Carlson, [place]  01/26/2018 10:14 AM    [address] [place], [place], [place]  [address] Phone: [phone]; Fax: [phone]

## 2018-01-26 ENCOUNTER — Encounter: Payer: Self-pay | Admitting: Physician Assistant

## 2018-01-26 ENCOUNTER — Telehealth: Payer: Self-pay

## 2018-01-26 DIAGNOSIS — E875 Hyperkalemia: Secondary | ICD-10-CM

## 2018-01-26 MED ORDER — ISOSORBIDE MONONITRATE ER 30 MG PO TB24: 30.0000 mg | ORAL_TABLET | Freq: Every day | ORAL | 2 refills | Status: DC

## 2018-01-26 MED ORDER — OLMESARTAN MEDOXOMIL 40 MG PO TABS: 20.0000 mg | ORAL_TABLET | Freq: Every day | ORAL | 0 refills | Status: DC

## 2018-01-26 NOTE — Telephone Encounter (Signed)
-----   Message from [place], [place] sent at 01/26/2018  3:54 PM EST ----- No significant anemia noted to explain weakness, thyroid level normal, however kidney function worsened slightly. Recommend decrease Benicar to 20mg  daily while increase Imdur to 30 mg daily. Increase hydration. Recheck BMET in 1-2 weeks

## 2018-01-26 NOTE — Progress Notes (Signed)
No significant anemia noted to explain weakness, thyroid level normal, however kidney function worsened slightly. Recommend decrease Benicar to 20mg  daily while increase Imdur to 30 mg daily. Increase hydration. Recheck BMET in 1-2 weeks

## 2018-01-26 NOTE — Telephone Encounter (Signed)
Patient notified directly.

## 2018-01-28 ENCOUNTER — Ambulatory Visit (HOSPITAL_COMMUNITY): Payer: Medicare Other | Attending: Cardiology

## 2018-01-28 ENCOUNTER — Other Ambulatory Visit: Payer: Self-pay

## 2018-01-28 DIAGNOSIS — I2510 Atherosclerotic heart disease of native coronary artery without angina pectoris: Secondary | ICD-10-CM | POA: Diagnosis not present

## 2018-01-28 DIAGNOSIS — Z951 Presence of aortocoronary bypass graft: Secondary | ICD-10-CM | POA: Diagnosis not present

## 2018-01-28 DIAGNOSIS — I270 Primary pulmonary hypertension: Secondary | ICD-10-CM | POA: Diagnosis not present

## 2018-01-28 DIAGNOSIS — I080 Rheumatic disorders of both mitral and aortic valves: Secondary | ICD-10-CM | POA: Insufficient documentation

## 2018-01-28 DIAGNOSIS — I252 Old myocardial infarction: Secondary | ICD-10-CM | POA: Insufficient documentation

## 2018-01-28 DIAGNOSIS — I77810 Thoracic aortic ectasia: Secondary | ICD-10-CM | POA: Insufficient documentation

## 2018-01-28 DIAGNOSIS — I350 Nonrheumatic aortic (valve) stenosis: Secondary | ICD-10-CM | POA: Diagnosis not present

## 2018-01-28 DIAGNOSIS — I493 Ventricular premature depolarization: Secondary | ICD-10-CM | POA: Diagnosis not present

## 2018-01-31 ENCOUNTER — Telehealth: Payer: Self-pay | Admitting: Cardiovascular Disease

## 2018-01-31 NOTE — Telephone Encounter (Signed)
Spoke with patient and her blood pressure was 140/70 after walking to get checked. The nurse didn't have time to let her sit and wait for recheck.  She is feeling better and is going to just see how she does over the next few days and call back if starts feeling bad again.

## 2018-01-31 NOTE — Telephone Encounter (Signed)
New message    Patient calling for echo results and to discuss medication changes. Patient states since her medication dosages changed she feel weak. Please call

## 2018-01-31 NOTE — Telephone Encounter (Signed)
Patient stated that she had been feeling weaker since decreasing Olmesartan and increasing Isosorbide and Atorvastatin. She has felt some better last 2 mornings. She is going to go to get a blood pressure check where she lives and call back later today.   Advised patient of results   Notes recorded by Lorretta Harp, MD on 01/31/2018 at 7:30 AM EST No change from prior study. Repeat in 12 months. Normal LV function, mild aortic stenosis, no change

## 2018-02-01 ENCOUNTER — Other Ambulatory Visit: Payer: Self-pay | Admitting: Cardiovascular Disease

## 2018-02-01 DIAGNOSIS — I350 Nonrheumatic aortic (valve) stenosis: Secondary | ICD-10-CM

## 2018-02-14 DIAGNOSIS — H04123 Dry eye syndrome of bilateral lacrimal glands: Secondary | ICD-10-CM | POA: Diagnosis not present

## 2018-02-14 DIAGNOSIS — H52203 Unspecified astigmatism, bilateral: Secondary | ICD-10-CM | POA: Diagnosis not present

## 2018-02-14 DIAGNOSIS — H4031X2 Glaucoma secondary to eye trauma, right eye, moderate stage: Secondary | ICD-10-CM | POA: Diagnosis not present

## 2018-02-16 ENCOUNTER — Ambulatory Visit (INDEPENDENT_AMBULATORY_CARE_PROVIDER_SITE_OTHER): Payer: Medicare Other | Admitting: Cardiovascular Disease

## 2018-02-16 ENCOUNTER — Encounter: Payer: Self-pay | Admitting: Cardiovascular Disease

## 2018-02-16 ENCOUNTER — Other Ambulatory Visit (HOSPITAL_COMMUNITY): Payer: Medicare Other

## 2018-02-16 DIAGNOSIS — Z951 Presence of aortocoronary bypass graft: Secondary | ICD-10-CM | POA: Diagnosis not present

## 2018-02-16 DIAGNOSIS — I350 Nonrheumatic aortic (valve) stenosis: Secondary | ICD-10-CM | POA: Diagnosis not present

## 2018-02-16 DIAGNOSIS — E785 Hyperlipidemia, unspecified: Secondary | ICD-10-CM | POA: Diagnosis not present

## 2018-02-16 DIAGNOSIS — I10 Essential (primary) hypertension: Secondary | ICD-10-CM | POA: Diagnosis not present

## 2018-02-16 DIAGNOSIS — I25810 Atherosclerosis of coronary artery bypass graft(s) without angina pectoris: Secondary | ICD-10-CM | POA: Diagnosis not present

## 2018-02-16 NOTE — Assessment & Plan Note (Signed)
History of dyslipidemia on statin therapy. Lipid profile performed 03/03/17 revealing total cholesterol 151, LDL 62 and HDL of 42.

## 2018-02-16 NOTE — Assessment & Plan Note (Signed)
History of essential hypertension blood pressure measured at 162/86. She is on Benicar and metoprolol. Continue current meds at current dosing.

## 2018-02-16 NOTE — Patient Instructions (Signed)
Medication Instructions: Your physician recommends that you continue on your current medications as directed. Please refer to the Current Medication list given to you today.   Follow-Up: Your physician recommends that you schedule a follow-up appointment in: 2 weeks with PharmD--pt wants to talk about Insomnia.  We request that you follow-up in: 3 months with Kerin Ransom, PA and in 12 months with Dr Andria Rhein will receive a reminder letter in the mail two months in advance. If you don't receive a letter, please call our office to schedule the follow-up appointment.  If you need a refill on your cardiac medications before your next appointment, please call your pharmacy.

## 2018-02-16 NOTE — Assessment & Plan Note (Signed)
Normal LV systolic function with mild aortic stenosis by 2-D echo performed 01/28/18 with valve area of 0.84 cm with a peak gradient of 24 mmHg.

## 2018-02-16 NOTE — Assessment & Plan Note (Signed)
History of CAD status post coronary artery bypass grafting 3 in 1997 by Dr.Van Trigt with a LIMA to the LAD, vein to an intermediate branch and obtuse marginal branch as well as PDA and PLA. Myoview performed in 2013 was low risk. She denies chest pain or shortness of breath.

## 2018-02-16 NOTE — Progress Notes (Signed)
02/16/2018 Kathryn Carlson   03/26/1927  [id number]  Primary Physician Burnard Bunting, MD Primary Cardiologist: Lorretta Harp MD FACP, [place], [place], [place]  HPI:  Kathryn Carlson is a 84 y.o.  moderately overweight widowed Caucasian female mother of 4 children who transitioned her care from Dr. Debara Pickett to myself at her request. I last saw her in the office 02/05/17 . She was formally a patient of Dr. Lowella Fairy. She has a history of ischemic heart disease status post myocardial infarction at age 63. She had coronary artery bypass grafting in 1997 by Dr. Prescott Gum Her last Myoview performed in 2013 was nonischemic. Her other Problems include symptomatic PVCs evaluated by Dr. Rayann Heman in the past as well as history of hypertension and hyperlipidemia. She denies chest pain or shortness of breath since I saw her in the office one year ago. Echo performed 01/28/18 revealed normal LV systolic function with mild aortic stenosis.   Current Meds  Medication Sig  . aspirin 81 MG tablet Take 162 mg by mouth daily.   Marland Kitchen atorvastatin (LIPITOR) 80 MG tablet Take 1 tablet (80 mg total) by mouth daily.  . chloridazePOXIDE-amitriptyline (LIMBITROL) 5-12.5 MG per tablet Take 1 tablet by mouth daily.  . cholecalciferol (VITAMIN D) 1000 UNITS tablet Take 2,000 Units by mouth daily.  Marland Kitchen estradiol (ESTRACE) 1 MG tablet Take 1 mg by mouth daily.  . hydrocortisone 2.5 % cream Apply 1 application topically daily as needed.  . isosorbide mononitrate (IMDUR) 30 MG 24 hr tablet Take 1 tablet (30 mg total) by mouth daily.  Marland Kitchen levothyroxine (SYNTHROID, LEVOTHROID) 75 MCG tablet Take 75 mcg by mouth daily before breakfast.  . metoprolol succinate (TOPROL-XL) 50 MG 24 hr tablet Take 50 mg by mouth daily.  . Multiple Vitamin (MULTIVITAMIN) capsule Take 1 capsule by mouth daily.  . niacin 500 MG tablet Take 500 mg by mouth daily with breakfast.  . olmesartan (BENICAR) 40 MG tablet Take 0.5 tablets (20 mg total) by mouth  daily.  . pantoprazole (PROTONIX) 40 MG tablet TAKE 1 TABLET BY MOUTH EVERY DAY  . TIMOLOL HEMIHYDRATE OP Place 1 drop into the right eye daily.  Marland Kitchen VITAMIN E PO Take 400 mg by mouth daily. Plus D  . zolpidem (AMBIEN) 10 MG tablet Take 1 tablet by mouth as directed.   Current Facility-Administered Medications for the 02/16/18 encounter (Office Visit) with Lorretta Harp, MD  Medication  . triamcinolone acetonide (KENALOG-40) injection 40 mg     Allergies  Allergen Reactions  . Novocain [Procaine]     Social History   Socioeconomic History  . Marital status: Widowed    Spouse name: Not on file  . Number of children: 4  . Years of education: 80  . Highest education level: Not on file  Social Needs  . Financial resource strain: Not on file  . Food insecurity - worry: Not on file  . Food insecurity - inability: Not on file  . Transportation needs - medical: Not on file  . Transportation needs - non-medical: Not on file  Occupational History  . Not on file  Tobacco Use  . Smoking status: Never Smoker  . Smokeless tobacco: Never Used  Substance and Sexual Activity  . Alcohol use: Yes    Comment: "a drink of wine every now and then"  . Drug use: Not on file  . Sexual activity: Not on file  Other Topics Concern  . Not on file  Social History  Narrative   Lives in [place]     Review of Systems: General: negative for chills, fever, night sweats or weight changes.  Cardiovascular: negative for chest pain, dyspnea on exertion, edema, orthopnea, palpitations, paroxysmal nocturnal dyspnea or shortness of breath Dermatological: negative for rash Respiratory: negative for cough or wheezing Urologic: negative for hematuria Abdominal: negative for nausea, vomiting, diarrhea, bright red blood per rectum, melena, or hematemesis Neurologic: negative for visual changes, syncope, or dizziness All other systems reviewed and are otherwise negative except as noted above.    Blood  pressure (!) 162/86, pulse 73, height 5\' 5"  (1.651 m), weight 197 lb 12.8 oz (89.7 kg).  General appearance: alert and no distress Neck: no adenopathy, no carotid bruit, no JVD, supple, symmetrical, trachea midline and thyroid not enlarged, symmetric, no tenderness/mass/nodules Lungs: clear to auscultation bilaterally Heart: Soft outflow tract murmur Extremities: extremities normal, atraumatic, no cyanosis or edema Pulses: 2+ and symmetric Skin: Skin color, texture, turgor normal. No rashes or lesions Neurologic: Alert and oriented X 3, normal strength and tone. Normal symmetric reflexes. Normal coordination and gait  EKG not performed today  ASSESSMENT AND PLAN:   HTN (hypertension) History of essential hypertension blood pressure measured at 162/86. She is on Benicar and metoprolol. Continue current meds at current dosing.  Dyslipidemia History of dyslipidemia on statin therapy. Lipid profile performed 03/03/17 revealing total cholesterol 151, LDL 62 and HDL of 42.  S/P CABG x 3 History of CAD status post coronary artery bypass grafting 3 in 1997 by Dr.Van Trigt with a LIMA to the LAD, vein to an intermediate branch and obtuse marginal branch as well as PDA and PLA. Myoview performed in 2013 was low risk. She denies chest pain or shortness of breath.  Aortic stenosis, mild Normal LV systolic function with mild aortic stenosis by 2-D echo performed 01/28/18 with valve area of 0.84 cm with a peak gradient of 24 mmHg.      Lorretta Harp MD FACP,FACC,FAHA, [facility] 02/16/2018 3:41 PM

## 2018-03-08 ENCOUNTER — Encounter: Payer: Self-pay | Admitting: Pharmacist Clinician (PhC)/ Clinical Pharmacy Specialist

## 2018-03-08 ENCOUNTER — Ambulatory Visit (INDEPENDENT_AMBULATORY_CARE_PROVIDER_SITE_OTHER): Payer: Medicare Other | Admitting: Pharmacist Clinician (PhC)/ Clinical Pharmacy Specialist

## 2018-03-08 VITALS — BP 162/84 | HR 84

## 2018-03-08 DIAGNOSIS — I25810 Atherosclerosis of coronary artery bypass graft(s) without angina pectoris: Secondary | ICD-10-CM | POA: Diagnosis not present

## 2018-03-08 DIAGNOSIS — I10 Essential (primary) hypertension: Secondary | ICD-10-CM | POA: Diagnosis not present

## 2018-03-08 NOTE — Assessment & Plan Note (Signed)
This is an 84 year old woman who is currently more concerned about her recent problems with insomnia than her blood pressure.   With her advanced age there could be several medications that are causing her to feel weak, as well as it could be related to her current poor sleep patterns.  I am hesitant to decrease the metoprolol today because of her elevated blood pressure.  She will have labs drawn this afternoon to check her BMET, but she recently had a 50% increase in SCr and I would like to see if it comes back down before adjusting her medications, as we may need to consider stopping the olmesartan.   Ideally I would recommend decreasing the metoprolol to 25 mg daily and adding in a low dose of amlodipine.    I did recommend that she cut her atorvastatin back to 40 mg, as her last LDL on that dose was at 81.  I am not sure this has anything to do with her muscle weakness or insomnia, but will see if she notes any improvement.  As for her insomnia, I do not wish to recommend any medications.  I suggested that she stop reading novels in bed late at night, avoid napping during the day and try some relaxation techniques.  She currently uses zolpidem 10 mg nightly and states that most nights it works well.

## 2018-03-08 NOTE — Progress Notes (Signed)
03/08/2018 SAPHRONIA Carlson August 29, 1927 [id number]   HPI:  Kathryn Carlson is a 84 y.o. female patient of Dr [name], with a PMH below who presents today for hypertension clinic evaluation.  In addition to hypertension, her medical history is significant for history of MI 50 years ago at the age of 8 and CABG x 5 71 years later at age 89.  She has not had any CAD issues in almost 20 years.  She also has NSVT, frequent PVCs, hyperlipidemia and mild aortic stenosis.  In the past 6+ months she has noted more weakness and insomnia and this is what actually is more concerning to her today.     She states these two problems have been plaguing her for less than a year, but she cannot correlate them directly to any specific medication changes.  She denies regular napping, but does admit to reading in bed nightly and always having a lot on her mind.  She has one adult son who was diagnosed with early onset Alzheimer's disease at the age of 58, about 10 years ago.    Blood Pressure Goal:  130/80  Current Medications:  Metoprolol succ 50 mg qd  olmesartan 20 mg qd  Family Hx:  Father died at 82 from MI.    Mother died at 30, also had MI  She had 3 sisters, one of whom had heart disease  Social Hx:  She has never been a smoker and drinks only the occasional glass of wine  Diet:  Lives at [facility] and eats in the dining hall   Exercise:  Nothing formal, but does walk the halls regularly  Home BP readings:  No home readings  Intolerances:   No cardiac medication intolerances  Labs:  12/2017:    Na 134, K 5.9, Glu 108, BUN 31, SCr 1.52  (CrCl 36.2)  09/2017:  Na 138, K 4.5, Glu 109, BUN 17, SCr 1.0    (CrCl 55.07)  Wt Readings from Last 3 Encounters:  02/16/18 197 lb 12.8 oz (89.7 kg)  01/25/18 194 lb (88 kg)  11/30/17 199 lb (90.3 kg)   BP Readings from Last 3 Encounters:  03/08/18 (!) 162/84  02/16/18 (!) 162/86  01/25/18 133/77   Pulse Readings from  Last 3 Encounters:  03/08/18 84  02/16/18 73  01/25/18 85    Current Outpatient Medications  Medication Sig Dispense Refill  . aspirin 81 MG tablet Take 162 mg by mouth daily.     Marland Kitchen atorvastatin (LIPITOR) 80 MG tablet Take 1 tablet (80 mg total) by mouth daily. 30 tablet 5  . chloridazePOXIDE-amitriptyline (LIMBITROL) 5-12.5 MG per tablet Take 1 tablet by mouth daily.    . cholecalciferol (VITAMIN D) 1000 UNITS tablet Take 2,000 Units by mouth daily.    Marland Kitchen estradiol (ESTRACE) 1 MG tablet Take 1 mg by mouth daily.    . hydrocortisone 2.5 % cream Apply 1 application topically daily as needed.    . isosorbide mononitrate (IMDUR) 30 MG 24 hr tablet Take 1 tablet (30 mg total) by mouth daily. 30 tablet 2  . levothyroxine (SYNTHROID, LEVOTHROID) 75 MCG tablet Take 75 mcg by mouth daily before breakfast.    . metoprolol succinate (TOPROL-XL) 50 MG 24 hr tablet Take 50 mg by mouth daily.  5  . Multiple Vitamin (MULTIVITAMIN) capsule Take 1 capsule by mouth daily.    . niacin 500 MG tablet Take 500 mg by mouth daily with breakfast.    .  olmesartan (BENICAR) 40 MG tablet Take 0.5 tablets (20 mg total) by mouth daily. 15 tablet 0  . pantoprazole (PROTONIX) 40 MG tablet TAKE 1 TABLET BY MOUTH EVERY DAY 90 tablet 3  . TIMOLOL HEMIHYDRATE OP Place 1 drop into the right eye daily.    Marland Kitchen VITAMIN E PO Take 400 mg by mouth daily. Plus D    . zolpidem (AMBIEN) 10 MG tablet Take 1 tablet by mouth as directed.  3   Current Facility-Administered Medications  Medication Dose Route Frequency Provider Last Rate Last Dose  . triamcinolone acetonide (KENALOG-40) injection 40 mg  40 mg Intramuscular Once Roselee Culver, MD        Allergies  Allergen Reactions  . Novocain [Procaine]     Past Medical History:  Diagnosis Date  . CAD (coronary artery disease)    possible ant wall MI ('97) - cath & CABG x5  . Exogenous obesity   . GERD (gastroesophageal reflux disease)   . History of nuclear stress test  09/2012   lexiscan; mild perfusion defect in apical anterior & apical region (infarct/scar) - no significant ischemia, low risk   . History of total bilateral knee replacement   . Hypertension   . Hypothyroidism   . PVC's (premature ventricular contractions)   . Squamous cell carcinoma of scalp    removed - Dr. Denna Haggard    Blood pressure (!) 162/84, pulse 84.  HTN (hypertension) This is an 84 year old woman who is currently more concerned about her recent problems with insomnia than her blood pressure.   With her advanced age there could be several medications that are causing her to feel weak, as well as it could be related to her current poor sleep patterns.  I am hesitant to decrease the metoprolol today because of her elevated blood pressure.  She will have labs drawn this afternoon to check her BMET, but she recently had a 50% increase in SCr and I would like to see if it comes back down before adjusting her medications, as we may need to consider stopping the olmesartan.   Ideally I would recommend decreasing the metoprolol to 25 mg daily and adding in a low dose of amlodipine.    I did recommend that she cut her atorvastatin back to 40 mg, as her last LDL on that dose was at 81.  I am not sure this has anything to do with her muscle weakness or insomnia, but will see if she notes any improvement.  As for her insomnia, I do not wish to recommend any medications.  I suggested that she stop reading novels in bed late at night, avoid napping during the day and try some relaxation techniques.  She currently uses zolpidem 10 mg nightly and states that most nights it works well.      Tommy Medal PharmD CPP [facility] [address] [place], [place] [address] [phone]

## 2018-03-08 NOTE — Patient Instructions (Addendum)
Call in 3-4 weeks to let us know if you are feeling better.  Kristin/Raquel at [phone]-  Get blood work done today before we decide on any changes to your blood pressure medications  Your blood pressure today is 162/84  Change your medications as follows:  Stop atorvastatin for 1 week, then resume at 40 mg each evening.   Bring all of your meds, your BP cuff and your record of home blood pressures to your next appointment.  Exercise as you're able, try to walk approximately 30 minutes per day.  Keep salt intake to a minimum, especially watch canned and prepared boxed foods.  Eat more fresh fruits and vegetables and fewer canned items.  Avoid eating in fast food restaurants.    HOW TO TAKE YOUR BLOOD PRESSURE: . Rest 5 minutes before taking your blood pressure. .  Don't smoke or drink caffeinated beverages for at least 30 minutes before. . Take your blood pressure before (not after) you eat. . Sit comfortably with your back supported and both feet on the floor (don't cross your legs). . Elevate your arm to heart level on a table or a desk. . Use the proper sized cuff. It should fit smoothly and snugly around your bare upper arm. There should be enough room to slip a fingertip under the cuff. The bottom edge of the cuff should be 1 inch above the crease of the elbow. . Ideally, take 3 measurements at one sitting and record the average.

## 2018-03-09 LAB — BASIC METABOLIC PANEL
BUN/Creatinine Ratio: 21 (ref 12–28)
BUN: 19 mg/dL (ref 8–27)
CALCIUM: 9.4 mg/dL (ref 8.7–10.3)
CHLORIDE: 102 mmol/L (ref 96–106)
CO2: 19 mmol/L — AB (ref 20–29)
Creatinine, Ser: 0.9 mg/dL (ref 0.57–1.00)
GFR calc non Af Amer: 57 mL/min/{1.73_m2} — ABNORMAL LOW (ref >59)
GFR, EST AFRICAN AMERICAN: 66 mL/min/{1.73_m2} (ref >59)
GLUCOSE: 113 mg/dL — AB (ref 65–99)
Potassium: 5 mmol/L (ref 3.5–5.2)
Sodium: 138 mmol/L (ref 134–144)

## 2018-03-17 ENCOUNTER — Other Ambulatory Visit: Payer: Self-pay | Admitting: Pharmacist Clinician (PhC)/ Clinical Pharmacy Specialist

## 2018-03-17 MED ORDER — ATORVASTATIN CALCIUM 40 MG PO TABS: 40.0000 mg | ORAL_TABLET | Freq: Every day | ORAL | 3 refills | Status: DC

## 2018-03-17 NOTE — Telephone Encounter (Signed)
Patient had been cutting 80 mg tablets in half.

## 2018-03-21 ENCOUNTER — Telehealth: Payer: Self-pay | Admitting: Cardiovascular Disease

## 2018-03-21 MED ORDER — ISOSORBIDE MONONITRATE ER 30 MG PO TB24: 30.0000 mg | ORAL_TABLET | Freq: Every day | ORAL | 2 refills | Status: DC

## 2018-03-21 NOTE — Telephone Encounter (Signed)
Kathryn Carlson is calling because she is needing a new prescription sent Isosorbide Mononitrate 30 mg for a 1 tablet a day . Her insurance will not pay unless it shows that instead of a half a tablet day . [facility] . Please call if you have any questions

## 2018-03-24 ENCOUNTER — Telehealth: Payer: Self-pay | Admitting: Cardiology

## 2018-03-24 NOTE — Telephone Encounter (Signed)
New message     *STAT* If patient is at the pharmacy, call can be transferred to refill team.   1. Which medications need to be refilled? (please list name of each medication and dose if known) isosorbide mononitrate (IMDUR) 30 MG 24 hr tablet  2. Which pharmacy/location (including street and city if local pharmacy) is medication to be sent to? [id number] [place] [place] - [address]  3. Do they need a 30 day or 90 day supply? [place]

## 2018-03-25 MED ORDER — ISOSORBIDE MONONITRATE ER 30 MG PO TB24: 30.0000 mg | ORAL_TABLET | Freq: Every day | ORAL | 0 refills | Status: DC

## 2018-04-05 DIAGNOSIS — D044 Carcinoma in situ of skin of scalp and neck: Secondary | ICD-10-CM | POA: Diagnosis not present

## 2018-04-05 DIAGNOSIS — L570 Actinic keratosis: Secondary | ICD-10-CM | POA: Diagnosis not present

## 2018-04-06 DIAGNOSIS — M1711 Unilateral primary osteoarthritis, right knee: Secondary | ICD-10-CM | POA: Diagnosis not present

## 2018-04-06 DIAGNOSIS — Z96651 Presence of right artificial knee joint: Secondary | ICD-10-CM | POA: Diagnosis not present

## 2018-04-06 DIAGNOSIS — Z471 Aftercare following joint replacement surgery: Secondary | ICD-10-CM | POA: Diagnosis not present

## 2018-04-28 DIAGNOSIS — M1990 Unspecified osteoarthritis, unspecified site: Secondary | ICD-10-CM | POA: Diagnosis not present

## 2018-04-28 DIAGNOSIS — E039 Hypothyroidism, unspecified: Secondary | ICD-10-CM | POA: Diagnosis not present

## 2018-04-28 DIAGNOSIS — I25810 Atherosclerosis of coronary artery bypass graft(s) without angina pectoris: Secondary | ICD-10-CM | POA: Diagnosis not present

## 2018-04-28 DIAGNOSIS — D638 Anemia in other chronic diseases classified elsewhere: Secondary | ICD-10-CM | POA: Diagnosis not present

## 2018-04-28 DIAGNOSIS — K219 Gastro-esophageal reflux disease without esophagitis: Secondary | ICD-10-CM | POA: Diagnosis not present

## 2018-04-28 DIAGNOSIS — M899 Disorder of bone, unspecified: Secondary | ICD-10-CM | POA: Diagnosis not present

## 2018-04-28 DIAGNOSIS — Z9181 History of falling: Secondary | ICD-10-CM | POA: Diagnosis not present

## 2018-04-28 DIAGNOSIS — R2681 Unsteadiness on feet: Secondary | ICD-10-CM | POA: Diagnosis not present

## 2018-04-28 DIAGNOSIS — M6281 Muscle weakness (generalized): Secondary | ICD-10-CM | POA: Diagnosis not present

## 2018-04-28 DIAGNOSIS — I10 Essential (primary) hypertension: Secondary | ICD-10-CM | POA: Diagnosis not present

## 2018-04-28 DIAGNOSIS — Z951 Presence of aortocoronary bypass graft: Secondary | ICD-10-CM | POA: Diagnosis not present

## 2018-04-28 DIAGNOSIS — M2550 Pain in unspecified joint: Secondary | ICD-10-CM | POA: Diagnosis not present

## 2018-05-04 DIAGNOSIS — I6529 Occlusion and stenosis of unspecified carotid artery: Secondary | ICD-10-CM | POA: Diagnosis not present

## 2018-05-04 DIAGNOSIS — I25810 Atherosclerosis of coronary artery bypass graft(s) without angina pectoris: Secondary | ICD-10-CM | POA: Diagnosis not present

## 2018-05-04 DIAGNOSIS — I10 Essential (primary) hypertension: Secondary | ICD-10-CM | POA: Diagnosis not present

## 2018-05-04 DIAGNOSIS — F329 Major depressive disorder, single episode, unspecified: Secondary | ICD-10-CM | POA: Diagnosis not present

## 2018-05-04 DIAGNOSIS — M6281 Muscle weakness (generalized): Secondary | ICD-10-CM | POA: Diagnosis not present

## 2018-05-04 DIAGNOSIS — D638 Anemia in other chronic diseases classified elsewhere: Secondary | ICD-10-CM | POA: Diagnosis not present

## 2018-05-04 DIAGNOSIS — E7849 Other hyperlipidemia: Secondary | ICD-10-CM | POA: Diagnosis not present

## 2018-05-04 DIAGNOSIS — K219 Gastro-esophageal reflux disease without esophagitis: Secondary | ICD-10-CM | POA: Diagnosis not present

## 2018-05-04 DIAGNOSIS — I350 Nonrheumatic aortic (valve) stenosis: Secondary | ICD-10-CM | POA: Diagnosis not present

## 2018-05-04 DIAGNOSIS — G4700 Insomnia, unspecified: Secondary | ICD-10-CM | POA: Diagnosis not present

## 2018-05-04 DIAGNOSIS — D692 Other nonthrombocytopenic purpura: Secondary | ICD-10-CM | POA: Diagnosis not present

## 2018-05-04 DIAGNOSIS — Z1389 Encounter for screening for other disorder: Secondary | ICD-10-CM | POA: Diagnosis not present

## 2018-05-06 DIAGNOSIS — Z9181 History of falling: Secondary | ICD-10-CM | POA: Diagnosis not present

## 2018-05-06 DIAGNOSIS — M2550 Pain in unspecified joint: Secondary | ICD-10-CM | POA: Diagnosis not present

## 2018-05-06 DIAGNOSIS — R2681 Unsteadiness on feet: Secondary | ICD-10-CM | POA: Diagnosis not present

## 2018-05-06 DIAGNOSIS — I10 Essential (primary) hypertension: Secondary | ICD-10-CM | POA: Diagnosis not present

## 2018-05-06 DIAGNOSIS — M6281 Muscle weakness (generalized): Secondary | ICD-10-CM | POA: Diagnosis not present

## 2018-05-06 DIAGNOSIS — K219 Gastro-esophageal reflux disease without esophagitis: Secondary | ICD-10-CM | POA: Diagnosis not present

## 2018-05-09 DIAGNOSIS — R2681 Unsteadiness on feet: Secondary | ICD-10-CM | POA: Diagnosis not present

## 2018-05-09 DIAGNOSIS — K219 Gastro-esophageal reflux disease without esophagitis: Secondary | ICD-10-CM | POA: Diagnosis not present

## 2018-05-09 DIAGNOSIS — Z9181 History of falling: Secondary | ICD-10-CM | POA: Diagnosis not present

## 2018-05-09 DIAGNOSIS — M2550 Pain in unspecified joint: Secondary | ICD-10-CM | POA: Diagnosis not present

## 2018-05-09 DIAGNOSIS — I10 Essential (primary) hypertension: Secondary | ICD-10-CM | POA: Diagnosis not present

## 2018-05-09 DIAGNOSIS — M6281 Muscle weakness (generalized): Secondary | ICD-10-CM | POA: Diagnosis not present

## 2018-05-11 DIAGNOSIS — M6281 Muscle weakness (generalized): Secondary | ICD-10-CM | POA: Diagnosis not present

## 2018-05-11 DIAGNOSIS — Z9181 History of falling: Secondary | ICD-10-CM | POA: Diagnosis not present

## 2018-05-11 DIAGNOSIS — R2681 Unsteadiness on feet: Secondary | ICD-10-CM | POA: Diagnosis not present

## 2018-05-11 DIAGNOSIS — M2550 Pain in unspecified joint: Secondary | ICD-10-CM | POA: Diagnosis not present

## 2018-05-11 DIAGNOSIS — K219 Gastro-esophageal reflux disease without esophagitis: Secondary | ICD-10-CM | POA: Diagnosis not present

## 2018-05-11 DIAGNOSIS — I10 Essential (primary) hypertension: Secondary | ICD-10-CM | POA: Diagnosis not present

## 2018-05-16 DIAGNOSIS — M2550 Pain in unspecified joint: Secondary | ICD-10-CM | POA: Diagnosis not present

## 2018-05-16 DIAGNOSIS — M6281 Muscle weakness (generalized): Secondary | ICD-10-CM | POA: Diagnosis not present

## 2018-05-16 DIAGNOSIS — Z9181 History of falling: Secondary | ICD-10-CM | POA: Diagnosis not present

## 2018-05-16 DIAGNOSIS — K219 Gastro-esophageal reflux disease without esophagitis: Secondary | ICD-10-CM | POA: Diagnosis not present

## 2018-05-16 DIAGNOSIS — R2681 Unsteadiness on feet: Secondary | ICD-10-CM | POA: Diagnosis not present

## 2018-05-16 DIAGNOSIS — I10 Essential (primary) hypertension: Secondary | ICD-10-CM | POA: Diagnosis not present

## 2018-05-17 DIAGNOSIS — D049 Carcinoma in situ of skin, unspecified: Secondary | ICD-10-CM | POA: Diagnosis not present

## 2018-05-17 DIAGNOSIS — L409 Psoriasis, unspecified: Secondary | ICD-10-CM | POA: Diagnosis not present

## 2018-05-18 ENCOUNTER — Telehealth: Payer: Self-pay | Admitting: *Deleted

## 2018-05-18 ENCOUNTER — Encounter: Payer: Self-pay | Admitting: Cardiology

## 2018-05-18 ENCOUNTER — Ambulatory Visit (INDEPENDENT_AMBULATORY_CARE_PROVIDER_SITE_OTHER): Payer: Medicare Other | Admitting: Cardiology

## 2018-05-18 DIAGNOSIS — K219 Gastro-esophageal reflux disease without esophagitis: Secondary | ICD-10-CM | POA: Diagnosis not present

## 2018-05-18 DIAGNOSIS — G4700 Insomnia, unspecified: Secondary | ICD-10-CM | POA: Diagnosis not present

## 2018-05-18 DIAGNOSIS — I25810 Atherosclerosis of coronary artery bypass graft(s) without angina pectoris: Secondary | ICD-10-CM

## 2018-05-18 DIAGNOSIS — I10 Essential (primary) hypertension: Secondary | ICD-10-CM

## 2018-05-18 DIAGNOSIS — I350 Nonrheumatic aortic (valve) stenosis: Secondary | ICD-10-CM | POA: Diagnosis not present

## 2018-05-18 DIAGNOSIS — R2681 Unsteadiness on feet: Secondary | ICD-10-CM | POA: Diagnosis not present

## 2018-05-18 DIAGNOSIS — M2550 Pain in unspecified joint: Secondary | ICD-10-CM | POA: Diagnosis not present

## 2018-05-18 DIAGNOSIS — Z951 Presence of aortocoronary bypass graft: Secondary | ICD-10-CM | POA: Diagnosis not present

## 2018-05-18 DIAGNOSIS — M6281 Muscle weakness (generalized): Secondary | ICD-10-CM | POA: Diagnosis not present

## 2018-05-18 DIAGNOSIS — Z9181 History of falling: Secondary | ICD-10-CM | POA: Diagnosis not present

## 2018-05-18 NOTE — Telephone Encounter (Signed)
Patient was seen in office today by Doreene Burke PA. Patients medications were reviewed and realized she is actually taking 2 Aspirin 81 mg daily. Per Lurena Joiner decrease to 1 daily Left message to call back

## 2018-05-18 NOTE — Assessment & Plan Note (Signed)
No obvious cardiac etiology- I have suggested she f/u with her PCP about this.

## 2018-05-18 NOTE — Assessment & Plan Note (Signed)
CABG x 3 '97 by Dr. Prescott Gum, LIMA to LAD, SVG to intermediate and obtuse marginal, SVG to the PD and PL Myoview low risk 2013

## 2018-05-18 NOTE — Telephone Encounter (Signed)
Advised patient decrease in Aspirin to 81 mg daily

## 2018-05-18 NOTE — Assessment & Plan Note (Signed)
Mild AS with normal LVF by echo feb 2019

## 2018-05-18 NOTE — Assessment & Plan Note (Signed)
Controlled.  

## 2018-05-18 NOTE — Progress Notes (Signed)
05/18/2018 Kathryn Carlson   1927-11-19  [id number]  Primary Physician Burnard Bunting, MD Primary Cardiologist: Dr [name]  HPI:  Delightful 84 y/o female, resident of [facility], with a history of a remote MI at age 87. Dr Velora Heckler would come to her home for check ups then. She eventually had CABG in 1997 and has done remarkably well since. A Myoview in 2013 showed a small area of anterior ischemia but overall was felt to be low risk. She has not had angina. She has had problems with frequent PVCs and is on beta blocker Rx for this. An echo in Feb 2019 showed mild AS with normal LVF and grade 1 DD. Dr [name] saw her in March. She is in the office today with complaints of insomnia. She was concerned it was secondary to one of her medications. I reviewed her medications with her in detail. She takes Lipitor, ASA, Benicar, and Limbitrol at night. She is aware of what each one is for. She read in a [facility] journal that heart patients should take half their medications at night, I did not dispute that. She also thinks there is a 5th medication that is not on our list and she can't remember it. From a cardiac standpoint she is doing well, no chest pain. She is a little anxious today but I think that is her baseline.    Current Outpatient Medications  Medication Sig Dispense Refill  . aspirin 81 MG tablet Take 81 mg by mouth daily.     Marland Kitchen atorvastatin (LIPITOR) 40 MG tablet Take 1 tablet (40 mg total) by mouth daily. 90 tablet 3  . chloridazePOXIDE-amitriptyline (LIMBITROL) 5-12.5 MG per tablet Take 1 tablet by mouth daily.    . cholecalciferol (VITAMIN D) 1000 UNITS tablet Take 2,000 Units by mouth daily.    Marland Kitchen estradiol (ESTRACE) 1 MG tablet Take 1 mg by mouth daily.    . hydrocortisone 2.5 % cream Apply 1 application topically daily as needed.    . isosorbide mononitrate (IMDUR) 30 MG 24 hr tablet Take 1 tablet (30 mg total) by mouth daily. 90 tablet 0  . levothyroxine  (SYNTHROID, LEVOTHROID) 75 MCG tablet Take 75 mcg by mouth daily before breakfast.    . metoprolol succinate (TOPROL-XL) 50 MG 24 hr tablet Take 50 mg by mouth daily.  5  . Multiple Vitamin (MULTIVITAMIN) capsule Take 1 capsule by mouth daily.    . niacin 500 MG tablet Take 500 mg by mouth daily with breakfast.    . olmesartan (BENICAR) 40 MG tablet Take 0.5 tablets (20 mg total) by mouth daily. 15 tablet 0  . pantoprazole (PROTONIX) 40 MG tablet TAKE 1 TABLET BY MOUTH EVERY DAY 90 tablet 3  . TIMOLOL HEMIHYDRATE OP Place 1 drop into the right eye daily.    Marland Kitchen VITAMIN E PO Take 400 mg by mouth daily. Plus D     Current Facility-Administered Medications  Medication Dose Route Frequency Provider Last Rate Last Dose  . triamcinolone acetonide (KENALOG-40) injection 40 mg  40 mg Intramuscular Once Roselee Culver, MD        Allergies  Allergen Reactions  . Novocain [Procaine]     Past Medical History:  Diagnosis Date  . CAD (coronary artery disease)    possible ant wall MI ('97) - cath & CABG x5  . Exogenous obesity   . GERD (gastroesophageal reflux disease)   . History of nuclear stress test 09/2012   lexiscan;  mild perfusion defect in apical anterior & apical region (infarct/scar) - no significant ischemia, low risk   . History of total bilateral knee replacement   . Hypertension   . Hypothyroidism   . PVC's (premature ventricular contractions)   . Squamous cell carcinoma of scalp    removed - Dr. Denna Haggard    Social History   Socioeconomic History  . Marital status: Widowed    Spouse name: Not on file  . Number of children: 4  . Years of education: 69  . Highest education level: Not on file  Occupational History  . Not on file  Social Needs  . Financial resource strain: Not on file  . Food insecurity:    Worry: Not on file    Inability: Not on file  . Transportation needs:    Medical: Not on file    Non-medical: Not on file  Tobacco Use  . Smoking status: Never  Smoker  . Smokeless tobacco: Never Used  Substance and Sexual Activity  . Alcohol use: Yes    Comment: "a drink of wine every now and then"  . Drug use: Not on file  . Sexual activity: Not on file  Lifestyle  . Physical activity:    Days per week: Not on file    Minutes per session: Not on file  . Stress: Not on file  Relationships  . Social connections:    Talks on phone: Not on file    Gets together: Not on file    Attends religious service: Not on file    Active member of club or organization: Not on file    Attends meetings of clubs or organizations: Not on file    Relationship status: Not on file  . Intimate partner violence:    Fear of current or ex partner: Not on file    Emotionally abused: Not on file    Physically abused: Not on file    Forced sexual activity: Not on file  Other Topics Concern  . Not on file  Social History Narrative   Lives in [place]     Family History  Problem Relation Age of Onset  . Heart attack Mother   . Heart attack Father   . Cancer Sister   . Diabetes Sister   . Heart Problems Sister      Review of Systems: General: negative for chills, fever, night sweats or weight changes.  Cardiovascular: negative for chest pain, dyspnea on exertion, edema, orthopnea, palpitations, paroxysmal nocturnal dyspnea or shortness of breath Dermatological: negative for rash Respiratory: negative for cough or wheezing Urologic: negative for hematuria Abdominal: negative for nausea, vomiting, diarrhea, bright red blood per rectum, melena, or hematemesis Neurologic: negative for visual changes, syncope, or dizziness All other systems reviewed and are otherwise negative except as noted above.    Blood pressure 134/66, pulse 87, height 5\' 5"  (1.651 m), weight 196 lb (88.9 kg).  General appearance: alert, cooperative, no distress and mildly obese Lungs: clear to auscultation bilaterally Heart: regular rate and rhythm and 2/6 systolic murmur  AOV Extremities: extremities normal, atraumatic, no cyanosis or edema Skin: Skin color, texture, turgor normal. No rashes or lesions Neurologic: Grossly normal   ASSESSMENT AND PLAN:   Insomnia No obvious cardiac etiology- I have suggested she f/u with her PCP about this.   Aortic stenosis, mild Mild AS with normal LVF by echo feb 2019  HTN (hypertension) Controlled  S/P CABG x 3 CABG x 3 '97 by Dr. Lucianne Lei  Trigt, LIMA to LAD, SVG to intermediate and obtuse marginal, SVG to the PD and PL Myoview low risk 2013   PLAN  Decrease ASA to 81 mg daily. She'll see if she can figure out the 5 th medication she takes in the evening and call us back, otherwise I have encouraged her to f/u with her PCP about her insomnia.   Kerin Ransom PA-C 05/18/2018 1:57 PM

## 2018-05-18 NOTE — Patient Instructions (Addendum)
Medication Instructions:  DECREASE YOUR ASPIRIN TO 81 MG ONE TABLET DAILY   Labwork: NONE  Testing/Procedures: NONE  Follow-Up: Your physician recommends that you schedule a follow-up appointment in: [place]   If you need a refill on your cardiac medications before your next appointment, please call your pharmacy.

## 2018-05-19 DIAGNOSIS — R2681 Unsteadiness on feet: Secondary | ICD-10-CM | POA: Diagnosis not present

## 2018-05-19 DIAGNOSIS — Z9181 History of falling: Secondary | ICD-10-CM | POA: Diagnosis not present

## 2018-05-19 DIAGNOSIS — K219 Gastro-esophageal reflux disease without esophagitis: Secondary | ICD-10-CM | POA: Diagnosis not present

## 2018-05-19 DIAGNOSIS — M2550 Pain in unspecified joint: Secondary | ICD-10-CM | POA: Diagnosis not present

## 2018-05-19 DIAGNOSIS — I10 Essential (primary) hypertension: Secondary | ICD-10-CM | POA: Diagnosis not present

## 2018-05-19 DIAGNOSIS — M6281 Muscle weakness (generalized): Secondary | ICD-10-CM | POA: Diagnosis not present

## 2018-05-20 DIAGNOSIS — M170 Bilateral primary osteoarthritis of knee: Secondary | ICD-10-CM | POA: Diagnosis not present

## 2018-05-20 DIAGNOSIS — M6281 Muscle weakness (generalized): Secondary | ICD-10-CM | POA: Diagnosis not present

## 2018-05-23 ENCOUNTER — Other Ambulatory Visit: Payer: Self-pay | Admitting: Cardiovascular Disease

## 2018-05-23 DIAGNOSIS — Z9181 History of falling: Secondary | ICD-10-CM | POA: Diagnosis not present

## 2018-05-23 DIAGNOSIS — I10 Essential (primary) hypertension: Secondary | ICD-10-CM | POA: Diagnosis not present

## 2018-05-23 DIAGNOSIS — M2550 Pain in unspecified joint: Secondary | ICD-10-CM | POA: Diagnosis not present

## 2018-05-23 DIAGNOSIS — M6281 Muscle weakness (generalized): Secondary | ICD-10-CM | POA: Diagnosis not present

## 2018-05-23 DIAGNOSIS — R2681 Unsteadiness on feet: Secondary | ICD-10-CM | POA: Diagnosis not present

## 2018-05-23 DIAGNOSIS — K219 Gastro-esophageal reflux disease without esophagitis: Secondary | ICD-10-CM | POA: Diagnosis not present

## 2018-05-24 NOTE — Telephone Encounter (Signed)
Rx sent to pharmacy   

## 2018-05-25 DIAGNOSIS — M6281 Muscle weakness (generalized): Secondary | ICD-10-CM | POA: Diagnosis not present

## 2018-05-25 DIAGNOSIS — M2550 Pain in unspecified joint: Secondary | ICD-10-CM | POA: Diagnosis not present

## 2018-05-25 DIAGNOSIS — K219 Gastro-esophageal reflux disease without esophagitis: Secondary | ICD-10-CM | POA: Diagnosis not present

## 2018-05-25 DIAGNOSIS — R2681 Unsteadiness on feet: Secondary | ICD-10-CM | POA: Diagnosis not present

## 2018-05-25 DIAGNOSIS — I10 Essential (primary) hypertension: Secondary | ICD-10-CM | POA: Diagnosis not present

## 2018-05-25 DIAGNOSIS — Z9181 History of falling: Secondary | ICD-10-CM | POA: Diagnosis not present

## 2018-06-08 DIAGNOSIS — I10 Essential (primary) hypertension: Secondary | ICD-10-CM | POA: Diagnosis not present

## 2018-06-08 DIAGNOSIS — D638 Anemia in other chronic diseases classified elsewhere: Secondary | ICD-10-CM | POA: Diagnosis not present

## 2018-06-08 DIAGNOSIS — I25810 Atherosclerosis of coronary artery bypass graft(s) without angina pectoris: Secondary | ICD-10-CM | POA: Diagnosis not present

## 2018-06-08 DIAGNOSIS — M899 Disorder of bone, unspecified: Secondary | ICD-10-CM | POA: Diagnosis not present

## 2018-06-08 DIAGNOSIS — M6281 Muscle weakness (generalized): Secondary | ICD-10-CM | POA: Diagnosis not present

## 2018-06-08 DIAGNOSIS — M1990 Unspecified osteoarthritis, unspecified site: Secondary | ICD-10-CM | POA: Diagnosis not present

## 2018-06-08 DIAGNOSIS — M2550 Pain in unspecified joint: Secondary | ICD-10-CM | POA: Diagnosis not present

## 2018-06-08 DIAGNOSIS — E039 Hypothyroidism, unspecified: Secondary | ICD-10-CM | POA: Diagnosis not present

## 2018-06-08 DIAGNOSIS — R2681 Unsteadiness on feet: Secondary | ICD-10-CM | POA: Diagnosis not present

## 2018-06-08 DIAGNOSIS — K219 Gastro-esophageal reflux disease without esophagitis: Secondary | ICD-10-CM | POA: Diagnosis not present

## 2018-06-08 DIAGNOSIS — Z9181 History of falling: Secondary | ICD-10-CM | POA: Diagnosis not present

## 2018-06-08 DIAGNOSIS — Z951 Presence of aortocoronary bypass graft: Secondary | ICD-10-CM | POA: Diagnosis not present

## 2018-06-13 DIAGNOSIS — M2550 Pain in unspecified joint: Secondary | ICD-10-CM | POA: Diagnosis not present

## 2018-06-13 DIAGNOSIS — R2681 Unsteadiness on feet: Secondary | ICD-10-CM | POA: Diagnosis not present

## 2018-06-13 DIAGNOSIS — I10 Essential (primary) hypertension: Secondary | ICD-10-CM | POA: Diagnosis not present

## 2018-06-13 DIAGNOSIS — K219 Gastro-esophageal reflux disease without esophagitis: Secondary | ICD-10-CM | POA: Diagnosis not present

## 2018-06-13 DIAGNOSIS — M6281 Muscle weakness (generalized): Secondary | ICD-10-CM | POA: Diagnosis not present

## 2018-06-13 DIAGNOSIS — Z9181 History of falling: Secondary | ICD-10-CM | POA: Diagnosis not present

## 2018-06-17 DIAGNOSIS — R2681 Unsteadiness on feet: Secondary | ICD-10-CM | POA: Diagnosis not present

## 2018-06-17 DIAGNOSIS — I10 Essential (primary) hypertension: Secondary | ICD-10-CM | POA: Diagnosis not present

## 2018-06-17 DIAGNOSIS — K219 Gastro-esophageal reflux disease without esophagitis: Secondary | ICD-10-CM | POA: Diagnosis not present

## 2018-06-17 DIAGNOSIS — M2550 Pain in unspecified joint: Secondary | ICD-10-CM | POA: Diagnosis not present

## 2018-06-17 DIAGNOSIS — M6281 Muscle weakness (generalized): Secondary | ICD-10-CM | POA: Diagnosis not present

## 2018-06-17 DIAGNOSIS — Z9181 History of falling: Secondary | ICD-10-CM | POA: Diagnosis not present

## 2018-06-20 DIAGNOSIS — M2550 Pain in unspecified joint: Secondary | ICD-10-CM | POA: Diagnosis not present

## 2018-06-20 DIAGNOSIS — K219 Gastro-esophageal reflux disease without esophagitis: Secondary | ICD-10-CM | POA: Diagnosis not present

## 2018-06-20 DIAGNOSIS — M6281 Muscle weakness (generalized): Secondary | ICD-10-CM | POA: Diagnosis not present

## 2018-06-20 DIAGNOSIS — Z9181 History of falling: Secondary | ICD-10-CM | POA: Diagnosis not present

## 2018-06-20 DIAGNOSIS — I10 Essential (primary) hypertension: Secondary | ICD-10-CM | POA: Diagnosis not present

## 2018-06-20 DIAGNOSIS — R2681 Unsteadiness on feet: Secondary | ICD-10-CM | POA: Diagnosis not present

## 2018-06-24 DIAGNOSIS — M2550 Pain in unspecified joint: Secondary | ICD-10-CM | POA: Diagnosis not present

## 2018-06-24 DIAGNOSIS — R2681 Unsteadiness on feet: Secondary | ICD-10-CM | POA: Diagnosis not present

## 2018-06-24 DIAGNOSIS — Z9181 History of falling: Secondary | ICD-10-CM | POA: Diagnosis not present

## 2018-06-24 DIAGNOSIS — M6281 Muscle weakness (generalized): Secondary | ICD-10-CM | POA: Diagnosis not present

## 2018-06-24 DIAGNOSIS — K219 Gastro-esophageal reflux disease without esophagitis: Secondary | ICD-10-CM | POA: Diagnosis not present

## 2018-06-24 DIAGNOSIS — I10 Essential (primary) hypertension: Secondary | ICD-10-CM | POA: Diagnosis not present

## 2018-06-27 ENCOUNTER — Telehealth: Payer: Self-pay | Admitting: Cardiovascular Disease

## 2018-06-27 DIAGNOSIS — I10 Essential (primary) hypertension: Secondary | ICD-10-CM | POA: Diagnosis not present

## 2018-06-27 DIAGNOSIS — R2681 Unsteadiness on feet: Secondary | ICD-10-CM | POA: Diagnosis not present

## 2018-06-27 DIAGNOSIS — Z9181 History of falling: Secondary | ICD-10-CM | POA: Diagnosis not present

## 2018-06-27 DIAGNOSIS — M6281 Muscle weakness (generalized): Secondary | ICD-10-CM | POA: Diagnosis not present

## 2018-06-27 NOTE — Telephone Encounter (Signed)
Advised patient to contact PCP

## 2018-06-27 NOTE — Telephone Encounter (Signed)
New Message   Patient is calling because she is suffering terribly from insomnia. She says that Saturday night she got no sleep She is wondering can she get something that may help her get to sleep. She spoke with the [facility] about this in the past. Please call.

## 2018-07-01 DIAGNOSIS — R2681 Unsteadiness on feet: Secondary | ICD-10-CM | POA: Diagnosis not present

## 2018-07-01 DIAGNOSIS — I10 Essential (primary) hypertension: Secondary | ICD-10-CM | POA: Diagnosis not present

## 2018-07-01 DIAGNOSIS — Z9181 History of falling: Secondary | ICD-10-CM | POA: Diagnosis not present

## 2018-07-01 DIAGNOSIS — M6281 Muscle weakness (generalized): Secondary | ICD-10-CM | POA: Diagnosis not present

## 2018-07-04 DIAGNOSIS — M6281 Muscle weakness (generalized): Secondary | ICD-10-CM | POA: Diagnosis not present

## 2018-07-04 DIAGNOSIS — I10 Essential (primary) hypertension: Secondary | ICD-10-CM | POA: Diagnosis not present

## 2018-07-04 DIAGNOSIS — R2681 Unsteadiness on feet: Secondary | ICD-10-CM | POA: Diagnosis not present

## 2018-07-04 DIAGNOSIS — Z9181 History of falling: Secondary | ICD-10-CM | POA: Diagnosis not present

## 2018-08-09 ENCOUNTER — Other Ambulatory Visit: Payer: Self-pay | Admitting: Dermatology

## 2018-08-09 DIAGNOSIS — D485 Neoplasm of uncertain behavior of skin: Secondary | ICD-10-CM | POA: Diagnosis not present

## 2018-08-09 DIAGNOSIS — L821 Other seborrheic keratosis: Secondary | ICD-10-CM | POA: Diagnosis not present

## 2018-08-09 DIAGNOSIS — D229 Melanocytic nevi, unspecified: Secondary | ICD-10-CM | POA: Diagnosis not present

## 2018-08-09 DIAGNOSIS — L570 Actinic keratosis: Secondary | ICD-10-CM | POA: Diagnosis not present

## 2018-08-09 DIAGNOSIS — B078 Other viral warts: Secondary | ICD-10-CM | POA: Diagnosis not present

## 2018-08-09 DIAGNOSIS — L820 Inflamed seborrheic keratosis: Secondary | ICD-10-CM | POA: Diagnosis not present

## 2018-08-15 DIAGNOSIS — H4031X2 Glaucoma secondary to eye trauma, right eye, moderate stage: Secondary | ICD-10-CM | POA: Diagnosis not present

## 2018-08-15 DIAGNOSIS — H353131 Nonexudative age-related macular degeneration, bilateral, early dry stage: Secondary | ICD-10-CM | POA: Diagnosis not present

## 2018-08-15 DIAGNOSIS — Z961 Presence of intraocular lens: Secondary | ICD-10-CM | POA: Diagnosis not present

## 2018-08-26 ENCOUNTER — Ambulatory Visit (INDEPENDENT_AMBULATORY_CARE_PROVIDER_SITE_OTHER): Payer: Medicare Other | Admitting: Cardiovascular Disease

## 2018-08-26 ENCOUNTER — Encounter: Payer: Self-pay | Admitting: Cardiovascular Disease

## 2018-08-26 DIAGNOSIS — I10 Essential (primary) hypertension: Secondary | ICD-10-CM | POA: Diagnosis not present

## 2018-08-26 DIAGNOSIS — I25810 Atherosclerosis of coronary artery bypass graft(s) without angina pectoris: Secondary | ICD-10-CM

## 2018-08-26 DIAGNOSIS — Z951 Presence of aortocoronary bypass graft: Secondary | ICD-10-CM | POA: Diagnosis not present

## 2018-08-26 DIAGNOSIS — E785 Hyperlipidemia, unspecified: Secondary | ICD-10-CM

## 2018-08-26 MED ORDER — OLMESARTAN MEDOXOMIL 40 MG PO TABS: 40.0000 mg | ORAL_TABLET | Freq: Every day | ORAL | 3 refills | Status: DC

## 2018-08-26 NOTE — Assessment & Plan Note (Signed)
History of CAD status post coronary artery bypass grafting in 1997 by Dr. Darcey Nora.  Her last Myoview performed in 2013 was nonischemic.  She denies chest pain or shortness of breath.

## 2018-08-26 NOTE — Progress Notes (Signed)
08/26/2018 Kathryn Carlson   06-Feb-1927  [id number]  Primary Physician Burnard Bunting, MD Primary Cardiologist: Lorretta Harp MD FACP, [place], [place], [place]  HPI:  AURIELLA Carlson is a 84 y.o.  moderately overweight widowed Caucasian female mother of 4 children who transitioned her care from Dr. Debara Pickett to myself at her request.I last saw her in the office  02/16/2018 .She was formally a patient of Dr. Lowella Fairy. She has a history of ischemic heart disease status post myocardial infarction at age 29. She had coronary artery bypass grafting in 1997 by Dr. Prescott Gum Her last Myoview performed in 2013 was nonischemic. Her other Problems include symptomatic PVCs evaluated by Dr. Rayann Heman in the past as well as history of hypertension and hyperlipidemia. She denies chest pain or shortness of breath since I saw her in the office one year ago. Echo performed 01/28/18 revealed normal LV systolic function with mild aortic stenosis. Since I saw her 6 months ago she has seen Kerin Ransom in the office 05/18/2018 at which time her Benicar was adjusted.  Her blood pressure is mildly elevated today.  Unfortunately she lost 1 of her sons 2 months ago to dementia   Current Meds  Medication Sig  . aspirin 81 MG tablet Take 81 mg by mouth daily.   Marland Kitchen atorvastatin (LIPITOR) 40 MG tablet Take 1 tablet (40 mg total) by mouth daily.  . chloridazePOXIDE-amitriptyline (LIMBITROL) 5-12.5 MG per tablet Take 1 tablet by mouth daily.  . cholecalciferol (VITAMIN D) 1000 UNITS tablet Take 2,000 Units by mouth daily.  Marland Kitchen estradiol (ESTRACE) 1 MG tablet Take 1 mg by mouth daily.  . hydrocortisone 2.5 % cream hydrocortisone 2.5 % topical cream  APPLY TO AFFECTED AREA TWICE A DAY  . isosorbide mononitrate (IMDUR) 30 MG 24 hr tablet TAKE 1 TABLET BY MOUTH EVERY DAY  . levothyroxine (SYNTHROID, LEVOTHROID) 75 MCG tablet Take 75 mcg by mouth daily before breakfast.  . metoprolol succinate (TOPROL-XL) 50 MG 24 hr tablet Take 50  mg by mouth daily.  . Multiple Vitamin (MULTIVITAMIN) capsule Take 1 capsule by mouth daily.  . niacin 500 MG tablet Take 500 mg by mouth daily with breakfast.  . olmesartan (BENICAR) 40 MG tablet Take 1 tablet (40 mg total) by mouth daily.  . pantoprazole (PROTONIX) 40 MG tablet TAKE 1 TABLET BY MOUTH EVERY DAY  . TIMOLOL HEMIHYDRATE OP Place 1 drop into the right eye daily.  Marland Kitchen VITAMIN E PO Take 400 mg by mouth daily. Plus D  . [DISCONTINUED] olmesartan (BENICAR) 40 MG tablet Take 0.5 tablets (20 mg total) by mouth daily.   Current Facility-Administered Medications for the 08/26/18 encounter (Office Visit) with Lorretta Harp, MD  Medication  . triamcinolone acetonide (KENALOG-40) injection 40 mg     Allergies  Allergen Reactions  . Novocain [Procaine]     Social History   Socioeconomic History  . Marital status: Widowed    Spouse name: Not on file  . Number of children: 4  . Years of education: 29  . Highest education level: Not on file  Occupational History  . Not on file  Social Needs  . Financial resource strain: Not on file  . Food insecurity:    Worry: Not on file    Inability: Not on file  . Transportation needs:    Medical: Not on file    Non-medical: Not on file  Tobacco Use  . Smoking status: Never Smoker  . Smokeless tobacco:  Never Used  Substance and Sexual Activity  . Alcohol use: Yes    Comment: "a drink of wine every now and then"  . Drug use: Not on file  . Sexual activity: Not on file  Lifestyle  . Physical activity:    Days per week: Not on file    Minutes per session: Not on file  . Stress: Not on file  Relationships  . Social connections:    Talks on phone: Not on file    Gets together: Not on file    Attends religious service: Not on file    Active member of club or organization: Not on file    Attends meetings of clubs or organizations: Not on file    Relationship status: Not on file  . Intimate partner violence:    Fear of current  or ex partner: Not on file    Emotionally abused: Not on file    Physically abused: Not on file    Forced sexual activity: Not on file  Other Topics Concern  . Not on file  Social History Narrative   Lives in [place]     Review of Systems: General: negative for chills, fever, night sweats or weight changes.  Cardiovascular: negative for chest pain, dyspnea on exertion, edema, orthopnea, palpitations, paroxysmal nocturnal dyspnea or shortness of breath Dermatological: negative for rash Respiratory: negative for cough or wheezing Urologic: negative for hematuria Abdominal: negative for nausea, vomiting, diarrhea, bright red blood per rectum, melena, or hematemesis Neurologic: negative for visual changes, syncope, or dizziness All other systems reviewed and are otherwise negative except as noted above.    Blood pressure (!) 180/86, pulse 79, height 5\' 5"  (1.651 m), weight 197 lb (89.4 kg).  General appearance: alert and no distress Neck: no adenopathy, no JVD, supple, symmetrical, trachea midline, thyroid not enlarged, symmetric, no tenderness/mass/nodules and Soft left carotid bruit Lungs: clear to auscultation bilaterally Heart: 2/6 outflow tract murmur consistent with aortic stenosis. Extremities: extremities normal, atraumatic, no cyanosis or edema Pulses: 2+ and symmetric Skin: Skin color, texture, turgor normal. No rashes or lesions Neurologic: Alert and oriented X 3, normal strength and tone. Normal symmetric reflexes. Normal coordination and gait  EKG sinus rhythm at 79 without ST or T wave changes.  Personally reviewed this EKG.  ASSESSMENT AND PLAN:   HTN (hypertension) History of essential hypertension blood pressure measured today at 180/86.  She is on metoprolol and Benicar.  Continue current meds at current dosing.  Dyslipidemia History of dyslipidemia on statin therapy with lipid profile performed 10/21/2017 revealing total cholesterol 162, LDL of 81 and HDL of  37.  S/P CABG x 3 History of CAD status post coronary artery bypass grafting in 1997 by Dr. Darcey Nora.  Her last Myoview performed in 2013 was nonischemic.  She denies chest pain or shortness of breath.  Aortic stenosis, mild History of mild aortic stenosis with a mean gradient 12 mmHg by echo performed 01/28/2018      Lorretta Harp MD [facility], [facility] 08/26/2018 1:49 PM

## 2018-08-26 NOTE — Patient Instructions (Signed)
Medication Instructions:   INCREASE OLMESARTAN TO 20 MG ONCE DAILY= 1 WHOLE 40 MG TABLET ONCE DAILY  Labwork:  Your physician recommends that you return for lab work in: [place]:  Your physician recommends that you schedule a follow-up appointment in: [place]  Your physician wants you to follow-up in: [place] You will receive a reminder letter in the mail two months in advance. If you don't receive a letter, please call our office to schedule the follow-up appointment.    Your physician wants you to follow-up in: [place] will receive a reminder letter in the mail two months in advance. If you don't receive a letter, please call our office to schedule the follow-up appointment.   If you need a refill on your cardiac medications before your next appointment, please call your pharmacy.

## 2018-08-26 NOTE — Assessment & Plan Note (Signed)
History of mild aortic stenosis with a mean gradient 12 mmHg by echo performed 01/28/2018

## 2018-08-26 NOTE — Assessment & Plan Note (Signed)
History of essential hypertension blood pressure measured today at 180/86.  She is on metoprolol and Benicar.  Continue current meds at current dosing.

## 2018-08-26 NOTE — Assessment & Plan Note (Signed)
History of dyslipidemia on statin therapy with lipid profile performed 10/21/2017 revealing total cholesterol 162, LDL of 81 and HDL of 37.

## 2018-09-08 ENCOUNTER — Ambulatory Visit: Payer: Medicare Other

## 2018-09-09 DIAGNOSIS — I10 Essential (primary) hypertension: Secondary | ICD-10-CM | POA: Diagnosis not present

## 2018-09-09 LAB — BASIC METABOLIC PANEL
BUN/Creatinine Ratio: 20 (ref 12–28)
BUN: 17 mg/dL (ref 8–27)
CO2: 24 mmol/L (ref 20–29)
CREATININE: 0.85 mg/dL (ref 0.57–1.00)
Calcium: 9.3 mg/dL (ref 8.7–10.3)
Chloride: 100 mmol/L (ref 96–106)
GFR, EST AFRICAN AMERICAN: 70 mL/min/{1.73_m2} (ref >59)
GFR, EST NON AFRICAN AMERICAN: 61 mL/min/{1.73_m2} (ref >59)
Glucose: 112 mg/dL — ABNORMAL HIGH (ref 65–99)
POTASSIUM: 4.9 mmol/L (ref 3.5–5.2)
Sodium: 136 mmol/L (ref 134–144)

## 2018-09-12 DIAGNOSIS — R296 Repeated falls: Secondary | ICD-10-CM | POA: Diagnosis not present

## 2018-09-12 DIAGNOSIS — I10 Essential (primary) hypertension: Secondary | ICD-10-CM | POA: Diagnosis not present

## 2018-09-12 DIAGNOSIS — R269 Unspecified abnormalities of gait and mobility: Secondary | ICD-10-CM | POA: Diagnosis not present

## 2018-09-12 DIAGNOSIS — Z6834 Body mass index (BMI) 34.0-34.9, adult: Secondary | ICD-10-CM | POA: Diagnosis not present

## 2018-09-12 DIAGNOSIS — M6281 Muscle weakness (generalized): Secondary | ICD-10-CM | POA: Diagnosis not present

## 2018-09-19 ENCOUNTER — Ambulatory Visit (INDEPENDENT_AMBULATORY_CARE_PROVIDER_SITE_OTHER): Payer: Medicare Other | Admitting: Pharmacist Clinician (PhC)/ Clinical Pharmacy Specialist

## 2018-09-19 ENCOUNTER — Encounter: Payer: Self-pay | Admitting: Pharmacist Clinician (PhC)/ Clinical Pharmacy Specialist

## 2018-09-19 VITALS — BP 162/68 | HR 80

## 2018-09-19 DIAGNOSIS — I10 Essential (primary) hypertension: Secondary | ICD-10-CM

## 2018-09-19 DIAGNOSIS — R600 Localized edema: Secondary | ICD-10-CM

## 2018-09-19 DIAGNOSIS — I25810 Atherosclerosis of coronary artery bypass graft(s) without angina pectoris: Secondary | ICD-10-CM

## 2018-09-19 MED ORDER — ATORVASTATIN CALCIUM 40 MG PO TABS: 40.0000 mg | ORAL_TABLET | Freq: Every day | ORAL | 3 refills | Status: DC

## 2018-09-19 NOTE — Assessment & Plan Note (Addendum)
Patient currently with elevated BP, despite increase in olmesartan from 20 mg to 40 mg daily.  For now, I have asked that she have her pressure checked two to three times per week by the facility RN.  She will return in a month with those readings and from there we can determine her need for medication adjustments.   Because of the swelling in her right leg, I reviewed with Dr. Sallyanne Kuster - recommend patient to get lower extremity doppler to rule out DVT.

## 2018-09-19 NOTE — Patient Instructions (Addendum)
Return for a a follow up appointment in 1 month  Your blood pressure today is 162/68   Have the RN at [facility] check your blood pressure 2-3 times per week and bring a list of those readings back to your next visit.  Take your BP meds as follows:  Continue current medications for now, without change  Bring all of your meds and your record of home blood pressures to your next appointment.  Exercise as you're able, try to walk approximately 30 minutes per day.  Keep salt intake to a minimum, especially watch canned and prepared boxed foods.  Eat more fresh fruits and vegetables and fewer canned items.  Avoid eating in fast food restaurants.    HOW TO TAKE YOUR BLOOD PRESSURE: . Rest 5 minutes before taking your blood pressure. .  Don't smoke or drink caffeinated beverages for at least 30 minutes before. . Take your blood pressure before (not after) you eat. . Sit comfortably with your back supported and both feet on the floor (don't cross your legs). . Elevate your arm to heart level on a table or a desk. . Use the proper sized cuff. It should fit smoothly and snugly around your bare upper arm. There should be enough room to slip a fingertip under the cuff. The bottom edge of the cuff should be 1 inch above the crease of the elbow. . Ideally, take 3 measurements at one sitting and record the average.

## 2018-09-19 NOTE — Progress Notes (Signed)
09/19/2018 Kathryn Carlson [birth year] [id number]   HPI:  Kathryn Carlson is a 84 y.o. female patient of Dr [name], with a PMH below who presents today for hypertension clinic evaluation.  In addition to hypertension, her medical history is significant for ischemic heart disease (MI at age 71), CABG x [phone]), symptomatic PVCs and hyperlipidemia.  She has been doing well recently, although she did have a fall earlier this month (the day after her 89th birthday).  She hit her head and right hip, but did not lose consciousness.  [facility] RN evaluated patient for 2-3 days afterward for concussion concerns, but patient reports no problems.  She did have some soreness in her right hip and has noted some swelling in her right foot/calf since then.  It was not warm to the touch or causing her any discomfort.    She has had blood pressure issues off and on for several years.  Because she does not have a home cuff, it is hard to know whether her hypertension is related to being in the MD office.    Blood Pressure Goal:  130/80  Current Medications:  olmesartan 40 mg  Metoprolol succ 50 mg qd  Family Hx:             Father died at 12 from MI.               Mother died at 63, also had MI             She had 3 sisters, one of whom had heart disease  Social Hx:             She has never been a smoker and drinks only the occasional glass of wine  Diet:             Lives at [facility] ([place] [facility]) and eats in the dining hall  Exercise:              Nothing formal, but does walk the halls regularly  Home BP readings:  No home cuff - did have RN at [facility] check her BP for several days after her fall, was told it was "normal"  Intolerances:   No cardiac medication intolerances  Labs:  08/2018:  Na 136, K 4.9, Glu 112, BUN 17, SCr 0.85 (CrCl =63.3)  Wt Readings from Last 3 Encounters:  08/26/18 197 lb (89.4 kg)  05/18/18 196 lb (88.9 kg)  02/16/18 197 lb 12.8 oz (89.7  kg)   BP Readings from Last 3 Encounters:  09/19/18 (!) 162/68  08/26/18 (!) 180/86  05/18/18 134/66   Pulse Readings from Last 3 Encounters:  09/19/18 80  08/26/18 79  05/18/18 87    Current Outpatient Medications  Medication Sig Dispense Refill  . aspirin 81 MG tablet Take 81 mg by mouth daily.     Marland Kitchen atorvastatin (LIPITOR) 40 MG tablet Take 1 tablet (40 mg total) by mouth daily. 90 tablet 3  . chloridazePOXIDE-amitriptyline (LIMBITROL) 5-12.5 MG per tablet Take 1 tablet by mouth daily.    . cholecalciferol (VITAMIN D) 1000 UNITS tablet Take 2,000 Units by mouth daily.    Marland Kitchen estradiol (ESTRACE) 1 MG tablet Take 1 mg by mouth daily.    . isosorbide mononitrate (IMDUR) 30 MG 24 hr tablet TAKE 1 TABLET BY MOUTH EVERY DAY 90 tablet 0  . levothyroxine (SYNTHROID, LEVOTHROID) 75 MCG tablet Take 75 mcg by mouth daily before breakfast.    .  metoprolol succinate (TOPROL-XL) 50 MG 24 hr tablet Take 50 mg by mouth daily.  5  . niacin 500 MG tablet Take 500 mg by mouth daily with breakfast.    . olmesartan (BENICAR) 40 MG tablet Take 1 tablet (40 mg total) by mouth daily. 90 tablet 3  . pantoprazole (PROTONIX) 40 MG tablet TAKE 1 TABLET BY MOUTH EVERY DAY 90 tablet 3  . TIMOLOL HEMIHYDRATE OP Place 1 drop into the right eye daily.    Marland Kitchen VITAMIN E PO Take 400 mg by mouth daily. Plus D    . hydrocortisone 2.5 % cream hydrocortisone 2.5 % topical cream  APPLY TO AFFECTED AREA TWICE A DAY    . Multiple Vitamin (MULTIVITAMIN) capsule Take 1 capsule by mouth daily.     Current Facility-Administered Medications  Medication Dose Route Frequency Provider Last Rate Last Dose  . triamcinolone acetonide (KENALOG-40) injection 40 mg  40 mg Intramuscular Once Roselee Culver, MD        Allergies  Allergen Reactions  . Novocain [Procaine]     Past Medical History:  Diagnosis Date  . CAD (coronary artery disease)    possible ant wall MI ('97) - cath & CABG x5  . Exogenous obesity   . GERD  (gastroesophageal reflux disease)   . History of nuclear stress test 09/2012   lexiscan; mild perfusion defect in apical anterior & apical region (infarct/scar) - no significant ischemia, low risk   . History of total bilateral knee replacement   . Hypertension   . Hypothyroidism   . PVC's (premature ventricular contractions)   . Squamous cell carcinoma of scalp    removed - Dr. Denna Haggard    Blood pressure (!) 162/68, pulse 80.  HTN (hypertension) Patient currently with elevated BP, despite increase in olmesartan from 20 mg to 40 mg daily.  For now, I have asked that she have her pressure checked two to three times per week by the facility RN.  She will return in a month with those readings and from there we can determine her need for medication adjustments.   Because of the swelling in her right leg, I reviewed with Dr. Sallyanne Kuster - recommend patient to get lower extremity doppler to rule out DVT.     Tommy Medal PharmD CPP [facility] [address] [place], [place] [address] [phone]

## 2018-09-20 ENCOUNTER — Other Ambulatory Visit: Payer: Self-pay | Admitting: Cardiovascular Disease

## 2018-09-21 ENCOUNTER — Other Ambulatory Visit: Payer: Self-pay | Admitting: Cardiovascular Disease

## 2018-09-21 NOTE — Telephone Encounter (Signed)
Rx request sent to pharmacy.  

## 2018-09-22 ENCOUNTER — Ambulatory Visit (HOSPITAL_COMMUNITY)
Admission: RE | Admit: 2018-09-22 | Discharge: 2018-09-22 | Disposition: A | Discharge status: Home / Self Care | Payer: Medicare Other | Source: Ambulatory Visit | Attending: Cardiovascular Disease | Admitting: Cardiovascular Disease

## 2018-09-22 DIAGNOSIS — R600 Localized edema: Secondary | ICD-10-CM | POA: Diagnosis not present

## 2018-09-28 ENCOUNTER — Telehealth: Payer: Self-pay | Admitting: Cardiovascular Disease

## 2018-09-28 NOTE — Telephone Encounter (Signed)
New Message   Patient is returning call in reference to vascular study. Please call to discuss.

## 2018-09-28 NOTE — Telephone Encounter (Signed)
Per pt called re: right foot swelling also notes pain in right hip if sits for a long time.Pt fell day after birthday and was examined by nursing staff and felt was okay has had venous doppler which was neg for DVT Encouraged pt to f/u with PMD  and see if may need xray on hip.Will forward to Dr [name] for review and recommendations ./cy

## 2018-09-28 NOTE — Telephone Encounter (Signed)
Agree with recommendations.  

## 2018-10-10 DIAGNOSIS — Z23 Encounter for immunization: Secondary | ICD-10-CM | POA: Diagnosis not present

## 2018-10-26 DIAGNOSIS — Z9181 History of falling: Secondary | ICD-10-CM | POA: Diagnosis not present

## 2018-10-26 DIAGNOSIS — S93401D Sprain of unspecified ligament of right ankle, subsequent encounter: Secondary | ICD-10-CM | POA: Diagnosis not present

## 2018-10-27 ENCOUNTER — Ambulatory Visit (INDEPENDENT_AMBULATORY_CARE_PROVIDER_SITE_OTHER): Payer: Medicare Other | Admitting: Pharmacist Clinician (PhC)/ Clinical Pharmacy Specialist

## 2018-10-27 DIAGNOSIS — I10 Essential (primary) hypertension: Secondary | ICD-10-CM

## 2018-10-27 DIAGNOSIS — I25810 Atherosclerosis of coronary artery bypass graft(s) without angina pectoris: Secondary | ICD-10-CM

## 2018-10-27 NOTE — Progress Notes (Signed)
10/27/2018 REMELL GIAIMO [birth year] [id number]   HPI:  Kathryn Carlson is a 84 y.o. female patient of Dr [name], with a PMH below who presents today for hypertension clinic evaluation.  In addition to hypertension, her medical history is significant for ischemic heart disease (MI at age 52), CABG x [phone]), symptomatic PVCs and hyperlipidemia.  She has been doing well recently, although she did have a fall earlier this month (the day after her 89th birthday).  She hit her head and right hip, but did not lose consciousness.  [facility] RN evaluated patient for 2-3 days afterward for concussion concerns, but patient reports no problems.  She did have some soreness in her right hip and has noted some swelling in her right foot/calf since then.  It was not warm to the touch or causing her any discomfort.    She has had blood pressure issues off and on for several years.  Because she does not have a home cuff, it is hard to know whether her hypertension is related to being in the MD office.  At her last visit, we did not make any changes, but asked that she get her pressure checked a few times per week by the [facility] RN.    Today she returns, rather apologetic, as she only remembered to check her BP 4 times in the past month.  Tow of those showed systolic readings in the 191'Y, one at 160, and this morning she was 128/82.   She is due to see Dr. Reynaldo Minium next week and will have labs drawn on Monday for that.    Blood Pressure Goal:  <150/80 (due to increased age and recent fall)  Current Medications:  olmesartan 40 mg  Metoprolol succ 50 mg qd  Family Hx:             Father died at 16 from MI.               Mother died at 27, also had MI             She had 3 sisters, one of whom had heart disease  Social Hx:             She has never been a smoker and drinks only the occasional glass of wine  Diet:             Lives at [facility] ([place] [facility]) and eats in the dining  hall  Exercise:  Nothing formal, but does walk the halls regularly  Home BP readings:  No home cuff - did have RN at [facility] check her BP for several days after her fall, was told it was "normal"  Intolerances:   No cardiac medication intolerances  Labs:  08/2018:  Na 136, K 4.9, Glu 112, BUN 17, SCr 0.85 (CrCl =63.3)  Wt Readings from Last 3 Encounters:  08/26/18 197 lb (89.4 kg)  05/18/18 196 lb (88.9 kg)  02/16/18 197 lb 12.8 oz (89.7 kg)   BP Readings from Last 3 Encounters:  10/27/18 122/68  09/19/18 (!) 162/68  08/26/18 (!) 180/86   Pulse Readings from Last 3 Encounters:  10/27/18 76  09/19/18 80  08/26/18 79    Current Outpatient Medications  Medication Sig Dispense Refill  . aspirin 81 MG tablet Take 81 mg by mouth daily.     Marland Kitchen atorvastatin (LIPITOR) 40 MG tablet Take 1 tablet (40 mg total) by mouth daily. 90 tablet 3  .  chloridazePOXIDE-amitriptyline (LIMBITROL) 5-12.5 MG per tablet Take 1 tablet by mouth daily.    . cholecalciferol (VITAMIN D) 1000 UNITS tablet Take 2,000 Units by mouth daily.    Marland Kitchen estradiol (ESTRACE) 1 MG tablet Take 1 mg by mouth daily.    . hydrocortisone 2.5 % cream hydrocortisone 2.5 % topical cream  APPLY TO AFFECTED AREA TWICE A DAY    . isosorbide mononitrate (IMDUR) 30 MG 24 hr tablet TAKE 1 TABLET BY MOUTH EVERY DAY 90 tablet 0  . isosorbide mononitrate (IMDUR) 30 MG 24 hr tablet TAKE 1 TABLET BY MOUTH EVERY DAY 90 tablet 0  . levothyroxine (SYNTHROID, LEVOTHROID) 75 MCG tablet Take 75 mcg by mouth daily before breakfast.    . metoprolol succinate (TOPROL-XL) 50 MG 24 hr tablet Take 50 mg by mouth daily.  5  . Multiple Vitamin (MULTIVITAMIN) capsule Take 1 capsule by mouth daily.    . niacin 500 MG tablet Take 500 mg by mouth daily with breakfast.    . olmesartan (BENICAR) 40 MG tablet Take 1 tablet (40 mg total) by mouth daily. 90 tablet 3  . pantoprazole (PROTONIX) 40 MG tablet TAKE 1 TABLET BY MOUTH EVERY DAY 90 tablet 3  .  TIMOLOL HEMIHYDRATE OP Place 1 drop into the right eye daily.    Marland Kitchen VITAMIN E PO Take 400 mg by mouth daily. Plus D     Current Facility-Administered Medications  Medication Dose Route Frequency Provider Last Rate Last Dose  . triamcinolone acetonide (KENALOG-40) injection 40 mg  40 mg Intramuscular Once Roselee Culver, MD        Allergies  Allergen Reactions  . Novocain [Procaine]     Past Medical History:  Diagnosis Date  . CAD (coronary artery disease)    possible ant wall MI ('97) - cath & CABG x5  . Exogenous obesity   . GERD (gastroesophageal reflux disease)   . History of nuclear stress test 09/2012   lexiscan; mild perfusion defect in apical anterior & apical region (infarct/scar) - no significant ischemia, low risk   . History of total bilateral knee replacement   . Hypertension   . Hypothyroidism   . PVC's (premature ventricular contractions)   . Squamous cell carcinoma of scalp    removed - Dr. Denna Haggard    Blood pressure 122/68, pulse 76.  HTN (hypertension) Patient with essential hypertension, doing well in the office today.  Had 4 readings in past month, and only one was > 973 systolic.  Because she still has swelling in her right leg after fall 2 months ago, she is still somewhat unsteady on her feet and often uses a Programmer, multimedia.  Today her BP is excellent in the office, and although home readings were a little high, I do not want to add any medication and increase her fall risk.  She will try to do better with getting checks a few times each week, and she has our number to call [facility] should her readings stay > 532 systolic.     Tommy Medal PharmD CPP [facility] [address] [place], [place] [address] [phone]

## 2018-10-27 NOTE — Patient Instructions (Signed)
  Your blood pressure today is 122/72  Check your blood pressure at home 3-4 times per week with the Nurse and keep record of the readings.  Take your BP meds as follows:  Continue with all your current medications  Bring all of your meds, your BP cuff and your record of home blood pressures to your next appointment.  Exercise as you're able, try to walk approximately 30 minutes per day.  Keep salt intake to a minimum, especially watch canned and prepared boxed foods.  Eat more fresh fruits and vegetables and fewer canned items.  Avoid eating in fast food restaurants.    HOW TO TAKE YOUR BLOOD PRESSURE: . Rest 5 minutes before taking your blood pressure. .  Don't smoke or drink caffeinated beverages for at least 30 minutes before. . Take your blood pressure before (not after) you eat. . Sit comfortably with your back supported and both feet on the floor (don't cross your legs). . Elevate your arm to heart level on a table or a desk. . Use the proper sized cuff. It should fit smoothly and snugly around your bare upper arm. There should be enough room to slip a fingertip under the cuff. The bottom edge of the cuff should be 1 inch above the crease of the elbow. . Ideally, take 3 measurements at one sitting and record the average.

## 2018-10-27 NOTE — Assessment & Plan Note (Signed)
Patient with essential hypertension, doing well in the office today.  Had 4 readings in past month, and only one was > 702 systolic.  Because she still has swelling in her right leg after fall 2 months ago, she is still somewhat unsteady on her feet and often uses a Programmer, multimedia.  Today her BP is excellent in the office, and although home readings were a little high, I do not want to add any medication and increase her fall risk.  She will try to do better with getting checks a few times each week, and she has our number to call [facility] should her readings stay > 202 systolic.

## 2018-10-31 DIAGNOSIS — R82998 Other abnormal findings in urine: Secondary | ICD-10-CM | POA: Diagnosis not present

## 2018-10-31 DIAGNOSIS — M859 Disorder of bone density and structure, unspecified: Secondary | ICD-10-CM | POA: Diagnosis not present

## 2018-10-31 DIAGNOSIS — E7849 Other hyperlipidemia: Secondary | ICD-10-CM | POA: Diagnosis not present

## 2018-10-31 DIAGNOSIS — I10 Essential (primary) hypertension: Secondary | ICD-10-CM | POA: Diagnosis not present

## 2018-10-31 DIAGNOSIS — E038 Other specified hypothyroidism: Secondary | ICD-10-CM | POA: Diagnosis not present

## 2018-11-07 DIAGNOSIS — E038 Other specified hypothyroidism: Secondary | ICD-10-CM | POA: Diagnosis not present

## 2018-11-07 DIAGNOSIS — I6529 Occlusion and stenosis of unspecified carotid artery: Secondary | ICD-10-CM | POA: Diagnosis not present

## 2018-11-07 DIAGNOSIS — M1990 Unspecified osteoarthritis, unspecified site: Secondary | ICD-10-CM | POA: Diagnosis not present

## 2018-11-07 DIAGNOSIS — E7849 Other hyperlipidemia: Secondary | ICD-10-CM | POA: Diagnosis not present

## 2018-11-07 DIAGNOSIS — I350 Nonrheumatic aortic (valve) stenosis: Secondary | ICD-10-CM | POA: Diagnosis not present

## 2018-11-07 DIAGNOSIS — D638 Anemia in other chronic diseases classified elsewhere: Secondary | ICD-10-CM | POA: Diagnosis not present

## 2018-11-07 DIAGNOSIS — M859 Disorder of bone density and structure, unspecified: Secondary | ICD-10-CM | POA: Diagnosis not present

## 2018-11-07 DIAGNOSIS — I10 Essential (primary) hypertension: Secondary | ICD-10-CM | POA: Diagnosis not present

## 2018-11-07 DIAGNOSIS — E669 Obesity, unspecified: Secondary | ICD-10-CM | POA: Diagnosis not present

## 2018-11-07 DIAGNOSIS — Z0000 Encounter for general adult medical examination without abnormal findings: Secondary | ICD-10-CM | POA: Diagnosis not present

## 2018-11-07 DIAGNOSIS — R002 Palpitations: Secondary | ICD-10-CM | POA: Diagnosis not present

## 2018-11-07 DIAGNOSIS — I25810 Atherosclerosis of coronary artery bypass graft(s) without angina pectoris: Secondary | ICD-10-CM | POA: Diagnosis not present

## 2018-12-26 ENCOUNTER — Other Ambulatory Visit: Payer: Self-pay | Admitting: Dermatology

## 2018-12-26 DIAGNOSIS — D049 Carcinoma in situ of skin, unspecified: Secondary | ICD-10-CM | POA: Diagnosis not present

## 2018-12-26 DIAGNOSIS — L820 Inflamed seborrheic keratosis: Secondary | ICD-10-CM | POA: Diagnosis not present

## 2018-12-26 DIAGNOSIS — D485 Neoplasm of uncertain behavior of skin: Secondary | ICD-10-CM | POA: Diagnosis not present

## 2018-12-26 DIAGNOSIS — L570 Actinic keratosis: Secondary | ICD-10-CM | POA: Diagnosis not present

## 2018-12-26 DIAGNOSIS — L821 Other seborrheic keratosis: Secondary | ICD-10-CM | POA: Diagnosis not present

## 2019-01-18 DIAGNOSIS — M25551 Pain in right hip: Secondary | ICD-10-CM | POA: Diagnosis not present

## 2019-02-06 ENCOUNTER — Ambulatory Visit (HOSPITAL_COMMUNITY): Payer: Medicare Other | Attending: Cardiology

## 2019-02-06 ENCOUNTER — Other Ambulatory Visit: Payer: Self-pay | Admitting: *Deleted

## 2019-02-06 DIAGNOSIS — I350 Nonrheumatic aortic (valve) stenosis: Secondary | ICD-10-CM

## 2019-02-06 NOTE — Progress Notes (Signed)
echo

## 2019-02-17 ENCOUNTER — Telehealth: Payer: Self-pay | Admitting: Cardiovascular Disease

## 2019-02-17 MED ORDER — ATORVASTATIN CALCIUM 40 MG PO TABS: 40.0000 mg | ORAL_TABLET | Freq: Every day | ORAL | 1 refills | Status: DC

## 2019-02-17 NOTE — Telephone Encounter (Signed)
°*  STAT* If patient is at the pharmacy, call can be transferred to refill team.   1. Which medications need to be refilled? (please list name of each medication and dose if known) Atorvastatin 40 mg 2. Which pharmacy/location (including street and city if local pharmacy) is medication to be sent to? [facility]   3. Do they need a 30 day or 90 day supply? 90 for the price change

## 2019-02-17 NOTE — Telephone Encounter (Signed)
Rx(s) sent to pharmacy electronically.  

## 2019-02-22 ENCOUNTER — Encounter: Payer: Self-pay | Admitting: Cardiology

## 2019-02-22 ENCOUNTER — Ambulatory Visit (INDEPENDENT_AMBULATORY_CARE_PROVIDER_SITE_OTHER): Payer: Medicare Other | Admitting: Cardiology

## 2019-02-22 VITALS — BP 146/84 | HR 81 | Ht 64.5 in | Wt 194.8 lb

## 2019-02-22 DIAGNOSIS — I493 Ventricular premature depolarization: Secondary | ICD-10-CM

## 2019-02-22 DIAGNOSIS — I350 Nonrheumatic aortic (valve) stenosis: Secondary | ICD-10-CM

## 2019-02-22 DIAGNOSIS — Z951 Presence of aortocoronary bypass graft: Secondary | ICD-10-CM

## 2019-02-22 DIAGNOSIS — I10 Essential (primary) hypertension: Secondary | ICD-10-CM

## 2019-02-22 NOTE — Progress Notes (Signed)
02/22/2019 Kathryn Carlson   1927-07-17  [id number]  Primary Physician Burnard Bunting, MD Primary Cardiologist: Dr [name]  HPI:  Delightful 85 y/o female, resident of [facility] independent living, with a history of a remote MI at age 35. Dr Velora Heckler would come to her home for check ups then. She eventually had CABG in 1997 and has done remarkably well since.  A Myoview in 2013 showed a small area of anterior ischemia but overall was felt to be low risk. She has not had angina. She has had PVCs and is on beta blocker Rx for this. An echo in Feb 2019 showed mild AS with normal LVF and grade 1 DD.  Repeat echo 01/29/2019 was unchanged.  She continues to do well- no history of angina, no recent falls. She had a list of her B/P readings but tells me she shredded them by mistake.    Current Outpatient Medications  Medication Sig Dispense Refill  . aspirin 81 MG tablet Take 81 mg by mouth daily.     Marland Kitchen atorvastatin (LIPITOR) 40 MG tablet Take 1 tablet (40 mg total) by mouth daily. 90 tablet 1  . chloridazePOXIDE-amitriptyline (LIMBITROL) 5-12.5 MG per tablet Take 1 tablet by mouth daily.    . cholecalciferol (VITAMIN D) 1000 UNITS tablet Take 2,000 Units by mouth daily.    Marland Kitchen estradiol (ESTRACE) 1 MG tablet Take 1 mg by mouth daily.    . hydrocortisone 2.5 % cream hydrocortisone 2.5 % topical cream  APPLY TO AFFECTED AREA TWICE A DAY    . isosorbide mononitrate (IMDUR) 30 MG 24 hr tablet TAKE 1 TABLET BY MOUTH EVERY DAY 90 tablet 0  . levothyroxine (SYNTHROID, LEVOTHROID) 75 MCG tablet Take 75 mcg by mouth daily before breakfast.    . metoprolol succinate (TOPROL-XL) 50 MG 24 hr tablet Take 50 mg by mouth daily.  5  . Multiple Vitamin (MULTIVITAMIN) capsule Take 1 capsule by mouth daily.    . niacin 500 MG tablet Take 500 mg by mouth daily with breakfast.    . olmesartan (BENICAR) 40 MG tablet Take 1 tablet (40 mg total) by mouth daily. 90 tablet 3  . pantoprazole (PROTONIX) 40 MG tablet TAKE  1 TABLET BY MOUTH EVERY DAY 90 tablet 3  . TIMOLOL HEMIHYDRATE OP Place 1 drop into the right eye daily.    Marland Kitchen VITAMIN E PO Take 400 mg by mouth daily. Plus D     Current Facility-Administered Medications  Medication Dose Route Frequency Provider Last Rate Last Dose  . triamcinolone acetonide (KENALOG-40) injection 40 mg  40 mg Intramuscular Once Roselee Culver, MD        Allergies  Allergen Reactions  . Novocain [Procaine]     Past Medical History:  Diagnosis Date  . CAD (coronary artery disease)    possible ant wall MI ('97) - cath & CABG x5  . Exogenous obesity   . GERD (gastroesophageal reflux disease)   . History of nuclear stress test 09/2012   lexiscan; mild perfusion defect in apical anterior & apical region (infarct/scar) - no significant ischemia, low risk   . History of total bilateral knee replacement   . Hypertension   . Hypothyroidism   . PVC's (premature ventricular contractions)   . Squamous cell carcinoma of scalp    removed - Dr. Denna Haggard    Social History   Socioeconomic History  . Marital status: Widowed    Spouse name: Not on file  . Number of  children: 4  . Years of education: 59  . Highest education level: Not on file  Occupational History  . Not on file  Social Needs  . Financial resource strain: Not on file  . Food insecurity:    Worry: Not on file    Inability: Not on file  . Transportation needs:    Medical: Not on file    Non-medical: Not on file  Tobacco Use  . Smoking status: Never Smoker  . Smokeless tobacco: Never Used  Substance and Sexual Activity  . Alcohol use: Yes    Comment: "a drink of wine every now and then"  . Drug use: Not on file  . Sexual activity: Not on file  Lifestyle  . Physical activity:    Days per week: Not on file    Minutes per session: Not on file  . Stress: Not on file  Relationships  . Social connections:    Talks on phone: Not on file    Gets together: Not on file    Attends religious service:  Not on file    Active member of club or organization: Not on file    Attends meetings of clubs or organizations: Not on file    Relationship status: Not on file  . Intimate partner violence:    Fear of current or ex partner: Not on file    Emotionally abused: Not on file    Physically abused: Not on file    Forced sexual activity: Not on file  Other Topics Concern  . Not on file  Social History Narrative   Lives in [place]     Family History  Problem Relation Age of Onset  . Heart attack Mother   . Heart attack Father   . Cancer Sister   . Diabetes Sister   . Heart Problems Sister      Review of Systems: General: negative for chills, fever, night sweats or weight changes.  Cardiovascular: negative for chest pain, dyspnea on exertion, edema, orthopnea, palpitations, paroxysmal nocturnal dyspnea or shortness of breath Dermatological: negative for rash Respiratory: negative for cough or wheezing Urologic: negative for hematuria Abdominal: negative for nausea, vomiting, diarrhea, bright red blood per rectum, melena, or hematemesis Neurologic: negative for visual changes, syncope, or dizziness All other systems reviewed and are otherwise negative except as noted above.    Blood pressure (!) 146/84, pulse 81, height 5' 4.5" (1.638 m), weight 194 lb 12.8 oz (88.4 kg).  General appearance: alert, cooperative, no distress and mildly obese Neck: no JVD and transmitted murmur Lungs: clear to auscultation bilaterally Heart: regular rate and rhythm and 2/6 systolic murmur AOV Extremities: no edema Skin: warm and dry Neurologic: Grossly normal  EKG NSR, PVC  ASSESSMENT AND PLAN:   Aortic stenosis, mild Mild AS with normal LVF by echo feb 2019-no change Feb 2020  HTN (hypertension) Controlled- 118/76 by me lt arm  S/P CABG x 3 CABG x 3 '97 by Dr. Prescott Gum, LIMA to LAD, SVG to intermediate and obtuse marginal, SVG to the PD and PL Myoview low risk 2013  PLAN  I  reassured her her echo looked OK.  F/U Dr [name] in 6 months.  Repeat echo Feb 2021.  Kerin Ransom PA-C 02/22/2019 2:21 PM

## 2019-02-22 NOTE — Patient Instructions (Signed)
Medication Instructions:  Your physician recommends that you continue on your current medications as directed. Please refer to the Current Medication list given to you today.  If you need a refill on your cardiac medications before your next appointment, please call your pharmacy.   Lab work: None  If you have labs (blood work) drawn today and your tests are completely normal, you will receive your results only by: . MyChart Message (if you have MyChart) OR . A paper copy in the mail If you have any lab test that is abnormal or we need to change your treatment, we will call you to review the results.  Testing/Procedures: None   Follow-Up: At [facility], you and your health needs are our priority.  As part of our continuing mission to provide you with exceptional heart care, we have created designated Provider Care Teams.  These Care Teams include your primary Cardiologist (physician) and Advanced Practice Providers (APPs -  Physician Assistants and Nurse Practitioners) who all work together to provide you with the care you need, when you need it. You will need a follow up appointment in 6 months.  Please call our office 2 months in advance to schedule this appointment.  You may see Jonathan Berry, MD or one of the following Advanced Practice Providers on your designated Care Team:   Luke Kilroy, PA-C Krista Kroeger, PA-C . Callie Goodrich, PA-C  Any Other Special Instructions Will Be Listed Below (If Applicable).   

## 2019-02-24 NOTE — Addendum Note (Signed)
Addended by: Therisa Doyne on: 02/24/2019 04:55 PM   Modules accepted: Orders

## 2019-03-10 ENCOUNTER — Other Ambulatory Visit: Payer: Self-pay | Admitting: Cardiovascular Disease

## 2019-03-10 NOTE — Telephone Encounter (Signed)
Rx(s) sent to pharmacy electronically.  

## 2019-05-02 DIAGNOSIS — L602 Onychogryphosis: Secondary | ICD-10-CM | POA: Diagnosis not present

## 2019-05-02 DIAGNOSIS — Q6689 Other  specified congenital deformities of feet: Secondary | ICD-10-CM | POA: Diagnosis not present

## 2019-05-10 DIAGNOSIS — M8580 Other specified disorders of bone density and structure, unspecified site: Secondary | ICD-10-CM | POA: Diagnosis not present

## 2019-05-10 DIAGNOSIS — Z1331 Encounter for screening for depression: Secondary | ICD-10-CM | POA: Diagnosis not present

## 2019-05-10 DIAGNOSIS — R002 Palpitations: Secondary | ICD-10-CM | POA: Diagnosis not present

## 2019-05-10 DIAGNOSIS — K219 Gastro-esophageal reflux disease without esophagitis: Secondary | ICD-10-CM | POA: Diagnosis not present

## 2019-05-10 DIAGNOSIS — I10 Essential (primary) hypertension: Secondary | ICD-10-CM | POA: Diagnosis not present

## 2019-05-10 DIAGNOSIS — D638 Anemia in other chronic diseases classified elsewhere: Secondary | ICD-10-CM | POA: Diagnosis not present

## 2019-05-10 DIAGNOSIS — I25810 Atherosclerosis of coronary artery bypass graft(s) without angina pectoris: Secondary | ICD-10-CM | POA: Diagnosis not present

## 2019-05-10 DIAGNOSIS — M6281 Muscle weakness (generalized): Secondary | ICD-10-CM | POA: Diagnosis not present

## 2019-05-10 DIAGNOSIS — M1990 Unspecified osteoarthritis, unspecified site: Secondary | ICD-10-CM | POA: Diagnosis not present

## 2019-05-10 DIAGNOSIS — I350 Nonrheumatic aortic (valve) stenosis: Secondary | ICD-10-CM | POA: Diagnosis not present

## 2019-05-10 DIAGNOSIS — E785 Hyperlipidemia, unspecified: Secondary | ICD-10-CM | POA: Diagnosis not present

## 2019-05-10 DIAGNOSIS — Z1339 Encounter for screening examination for other mental health and behavioral disorders: Secondary | ICD-10-CM | POA: Diagnosis not present

## 2019-05-10 DIAGNOSIS — E039 Hypothyroidism, unspecified: Secondary | ICD-10-CM | POA: Diagnosis not present

## 2019-05-10 DIAGNOSIS — I6529 Occlusion and stenosis of unspecified carotid artery: Secondary | ICD-10-CM | POA: Diagnosis not present

## 2019-08-15 ENCOUNTER — Other Ambulatory Visit: Payer: Self-pay | Admitting: Cardiovascular Disease

## 2019-08-28 ENCOUNTER — Other Ambulatory Visit: Payer: Self-pay | Admitting: Cardiovascular Disease

## 2019-08-28 DIAGNOSIS — I10 Essential (primary) hypertension: Secondary | ICD-10-CM

## 2019-08-30 DIAGNOSIS — Z23 Encounter for immunization: Secondary | ICD-10-CM | POA: Diagnosis not present

## 2019-09-05 ENCOUNTER — Telehealth: Payer: Self-pay

## 2019-09-05 NOTE — Telephone Encounter (Signed)
      COVID-19 Pre-Screening Questions:  . In the past 7 to 10 days have you had a cough,  shortness of breath, headache, congestion, fever (100 or greater) body aches, chills, sore throat, or sudden loss of taste or sense of smell? HEADACHES A COUPLE TIMES; STATES THIS IS FROM OVER-EXERTING HERSELF . Have you been around anyone with known Covid 19? NO . Have you been around anyone who is awaiting Covid 19 test results in the past 7 to 10 days? NO  . Have you been around anyone who has been exposed to Covid 19, or has mentioned symptoms of Covid 19 within the past 7 to 10 days? NO  If you have any concerns/questions about symptoms patients report during screening (either on the phone or at threshold). Contact the provider seeing the patient or DOD for further guidance.  If neither are available contact a member of the leadership team.  pt was to have 6 mos ROV with Dr. [name] on 9/18 as virtual but would like to be seen sooner by an APP in the office rather than reschedule. Stated daughter is means of transportation and can only do Friday appts. Pt 9/18 appt with Dr. [name] cancelled and pt appt with Tommye Standard, PA-C set for 9/18 at 10:45am. Pt requests EKG to be done during appt with Kroeger,PA-C. Pt aware to wear mask during OV and of guest limit policy

## 2019-09-06 ENCOUNTER — Ambulatory Visit: Payer: Medicare Other | Admitting: Cardiovascular Disease

## 2019-09-14 NOTE — Progress Notes (Signed)
Cardiology Office Note   Date:  09/15/2019   ID:  Kathryn Carlson, DOB 09/19/1927, MRN [id number]  PCP:  Burnard Bunting, MD  Cardiologist:  Quay Burow, MD EP: None  No chief complaint on file.     History of Present Illness: Kathryn Carlson is a 85 y.o. female with PMH of CAD s/p remote MI at age 60 with subsequent CABG in 1997, mild aortic stenosis, HTN, HLD, hypothyroidism, and GERD, who presents for routine follow-up of her CAD.  She was last evaluated by cardiology at an outpatient visit with Kerin Ransom, PA-C 01/2019, at which time she was doing well from a cardiac standpoint. No medication changes occurred at that time and she was recommended to follow-up in 6 months. Her last ischemic evaluation was a myoview in 2013 which was low risk. Her last echocardiogram 01/2019 showed EF 50-55%, G2DD, mild MAC, and mild AS.   She presents today for routine follow-up of her CAD. She continues to live at [facility] independent living. She reports her energy level has decreased over the past 6 months as she has been quarantined to her room which has limited her physical activity. She is still trying to walk the hallways in the evenings for exercise. No complaints of chest pain or SOB with activity. No complaints of palpitations dizziness, or lightheadedness. She did experience a mechanical fall onto her buttocks while trying to pull a box from underneath her bed, for which facility staff had to come an help her up. She reports sore knees, otherwise no injuries.     Past Medical History:  Diagnosis Date  . BCC (basal cell carcinoma of skin) 01/09/2009   Lower Central Back (tx p bx)  . CAD (coronary artery disease)    possible ant wall MI ('97) - cath & CABG x5  . Exogenous obesity   . GERD (gastroesophageal reflux disease)   . History of nuclear stress test 09/2012   lexiscan; mild perfusion defect in apical anterior & apical region (infarct/scar) - no significant ischemia, low  risk   . History of total bilateral knee replacement   . Hypertension   . Hypothyroidism   . Nodular basal cell carcinoma (BCC) 08/17/2017   Left Calf (tx p bx)  . PVC's (premature ventricular contractions)   . SCC (squamous cell carcinoma) [place] 01/29/2004   Right Forearm (Cx3,5FU)  . SCCA (squamous cell carcinoma) of skin 03/13/2005   Mid Upper Back  . SCCA (squamous cell carcinoma) of skin 09/07/2005   Left Elbow(Cx3,5FU)  . SCCA (squamous cell carcinoma) of skin 01/25/2006   Left Brow(in situ) (Cx3,5FU)  . SCCA (squamous cell carcinoma) of skin 04/27/2006   Inner Left Shoulder(Keratoacanthoma) (Exc.)  . SCCA (squamous cell carcinoma) of skin 03/22/2007   Left Temple(in situ) and Bridge of Nose(in situ) (Cs3,5FU)  . SCCA (squamous cell carcinoma) of skin 05/12/2007   Top of Scalp(Cx3,5FU)  . SCCA (squamous cell carcinoma) of skin 01/28/2011   Right Upper Arm(in situ)  . SCCA (squamous cell carcinoma) of skin 07/26/2012   Glabella, Inferior Tip (Cx3,5FU)  . SCCA (squamous cell carcinoma) of skin 03/08/2013   Left Front Scalp(in situ) and Upper Nose(in situ) (Cx3,5FU)  . SCCA (squamous cell carcinoma) of skin 07/24/2013   Right Lower Leg(Keratoacanthoma)  . SCCA (squamous cell carcinoma) of skin 09/11/2015   Left Scalp(in situ) (Cx3,5FU)  . SCCA (squamous cell carcinoma) of skin 01/15/2016   Left Forearm(in situ) and Left Temple(in situ) (tx p bx)  .  SCCA (squamous cell carcinoma) of skin 10/01/2016   Left Mid Forearm(in situ)(tx p bx) and Left Front Scalp(watch)  . SCCA (squamous cell carcinoma) of skin 11/12/2016   Left Temple(Keratoacanthoma) (watch)  . SCCA (squamous cell carcinoma) of skin 02/11/2017   Top Left Hand(Keratoacanthoma) (tx p bx)  . Squamous cell carcinoma in situ (SCCIS) 11/18/1999   Above Left Outer Eyebrow  . Squamous cell carcinoma of scalp    removed - Dr. Denna Haggard  . Superficial basal cell carcinoma (BCC) 07/14/2004   Left Scapula (Cx3,5FU)     Past Surgical History:  Procedure Laterality Date  . ABDOMINAL HYSTERECTOMY  1984  . APPENDECTOMY  1941  . Carotid Doppler  12/07/2012   R & L ICA - 0-49% diameter reduction  . CORONARY ARTERY BYPASS GRAFT  09/1996   cath & CABG x5 LIMA-LAD, SVG-sequential OD & OM, SVG sequential to PDA & PLA (Dr. Prescott Gum)  . REPLACEMENT TOTAL KNEE Bilateral 2001 & 2003  . TONSILLECTOMY    . TRANSTHORACIC ECHOCARDIOGRAM  08/23/2013   EF 50-55%, LV cavity size mod reduced, mild LVH, mild conc hypertrophy; mild AV stenosis & mild regurg; mild MR - ordered for murmur     Current Outpatient Medications  Medication Sig Dispense Refill  . aspirin 81 MG tablet Take 81 mg by mouth daily.     Marland Kitchen atorvastatin (LIPITOR) 40 MG tablet TAKE 1 TABLET BY MOUTH EVERY DAY 90 tablet 0  . chloridazePOXIDE-amitriptyline (LIMBITROL) 5-12.5 MG per tablet Take 1 tablet by mouth daily.    . cholecalciferol (VITAMIN D) 1000 UNITS tablet Take 2,000 Units by mouth daily.    Marland Kitchen estradiol (ESTRACE) 1 MG tablet Take 1 mg by mouth daily.    . hydrocortisone 2.5 % cream hydrocortisone 2.5 % topical cream  APPLY TO AFFECTED AREA TWICE A DAY    . Influenza vac split quadrivalent PF (FLUZONE HIGH-DOSE) 0.5 ML injection Fluzone High-Dose 2019-20 (PF) 180 mcg/0.5 mL intramuscular syringe  TO BE ADMINISTERED BY PHARMACIST FOR IMMUNIZATION    . isosorbide mononitrate (IMDUR) 30 MG 24 hr tablet Take 1 tablet (30 mg total) by mouth daily. 90 tablet 3  . levothyroxine (SYNTHROID, LEVOTHROID) 75 MCG tablet Take 75 mcg by mouth daily before breakfast.    . metoprolol succinate (TOPROL-XL) 25 MG 24 hr tablet Take 1 tablet (25 mg total) by mouth daily. Take 1.5 tablets by mouth daily 90 tablet 3  . Multiple Vitamin (MULTIVITAMIN) capsule Take 1 capsule by mouth daily.    . niacin 500 MG tablet Take 500 mg by mouth daily with breakfast.    . olmesartan (BENICAR) 40 MG tablet TAKE 1 TABLET BY MOUTH EVERY DAY 90 tablet 0  . pantoprazole (PROTONIX) 40  MG tablet TAKE 1 TABLET BY MOUTH EVERY DAY (Patient taking differently: Take by mouth as needed. ) 90 tablet 3  . TIMOLOL HEMIHYDRATE OP Place 1 drop into the right eye daily.    Marland Kitchen VITAMIN E PO Take 400 mg by mouth daily. Plus D    . metoprolol succinate (TOPROL-XL) 50 MG 24 hr tablet Take 1 tablet (50 mg total) by mouth daily. Take with or immediately following a meal. 90 tablet 3   Current Facility-Administered Medications  Medication Dose Route Frequency Provider Last Rate Last Dose  . triamcinolone acetonide (KENALOG-40) injection 40 mg  40 mg Intramuscular Once Roselee Culver, MD        Allergies:   Novocain [procaine]    Social History:  The patient  reports that she has never smoked. She has never used smokeless tobacco. She reports current alcohol use.   Family History:  The patient's family history includes Cancer in her sister; Diabetes in her sister; Heart Problems in her sister; Heart attack in her father and mother.    ROS:  Please see the history of present illness.   Otherwise, review of systems are positive for none.   All other systems are reviewed and negative.    PHYSICAL EXAM: VS:  BP (!) 165/87 (BP Location: Right Arm, Patient Position: Sitting, Cuff Size: Large)   Pulse 89   Ht 5' 4.5" (1.638 m)   Wt 191 lb 12.8 oz (87 kg)   SpO2 98%   BMI 32.41 kg/m  , BMI Body mass index is 32.41 kg/m. GEN: Well nourished, well developed, in no acute distress HEENT: sclera anicteric Neck: no JVD, carotid bruits, or masses Cardiac: RRR; +murmur, no rubs or gallops, no edema  Respiratory:  clear to auscultation bilaterally, normal work of breathing GI: soft, nontender, nondistended, + BS MS: no deformity or atrophy Skin: warm and dry, no rash Neuro:  Strength and sensation are intact Psych: euthymic mood, full affect   EKG:  EKG is ordered today. The ekg ordered today demonstrates sinus rhythm with 1st degree AV block, rate 89, QTc 438, no STE/D, no TWI, no  significant change from previous   Recent Labs: No results found for requested labs within last 8760 hours.    Lipid Panel    Component Value Date/Time   CHOL 151 03/03/2017 0842   TRIG 233 (H) 03/03/2017 0842   HDL 42 (L) 03/03/2017 0842   CHOLHDL 3.6 03/03/2017 0842   VLDL 47 (H) 03/03/2017 0842   LDLCALC 62 03/03/2017 0842      Wt Readings from Last 3 Encounters:  09/15/19 191 lb 12.8 oz (87 kg)  02/22/19 194 lb 12.8 oz (88.4 kg)  08/26/18 197 lb (89.4 kg)      Other studies Reviewed: Additional studies/ records that were reviewed today include:   Echcoardiogram 01/2019: 1. The left ventricle has low normal systolic function of 12-24%. The cavity size was normal. There is no increased left ventricular wall thickness. Echo evidence of pseudonormalization in diastolic relaxation. Elevated mean left atrial pressure.  2. The right ventricle has normal systolic function. The cavity was normal. There is no increase in right ventricular wall thickness. Right ventricular systolic pressure normal with an estimated pressure of 26.8 mmHg.  3. Left atrial size was mildly dilated.  4. The mitral valve is normal in structure. There is mild mitral annular calcification present.  5. The tricuspid valve is normal in structure.  6. The aortic valve is tricuspid There is moderate thickening and moderate calcification of the aortic valve, with mildly decreased cusp excursion. Aortic valve regurgitation is trivial by color flow Doppler. The calculated aortic valve area is 1.59  cm, consistent with mild stenosis.  7. There is mild dilatation of the ascending aorta.  8. No evidence of left ventricular regional wall motion abnormalities.    ASSESSMENT AND PLAN:  1. CAD s/p CABG in 1997: no anginal complaints. EKG appear stable. Echo 01/2019 with normal EF.  - Continue aspirin and statin - Continue metoprolol succinate (will increase dose to 32m daily for improved BP control) and imdur  2.  HTN: BP 138/80 on my repeat check today - Will increase metoprolol succinate to 753mdaily with plans to uptitrate if BP not at goal of <130/80  in 2 weeks. Patient will keep a log of her blood pressures and report back.  - Continue olmesartan and imdur  3. HLD: No recent lipids, however LDL controlled at 71 on labs 10/2018 - Continue atorvastatin - Patient to have [place] 10/2019 with PCP  4. Aortic stenosis: mild on echo 01/2019.  - Plan for repeat echo 01/2020   Current medicines are reviewed at length with the patient today.  The patient does not have concerns regarding medicines.  The following changes have been made:  Will increase metoprolol   Labs/ tests ordered today include:   Orders Placed This Encounter  Procedures  . EKG 12-Lead     Disposition:   FU with Dr. [name] in 6 months  Signed, Kathryn Butts, PA-C  09/15/2019 11:38 AM

## 2019-09-15 ENCOUNTER — Ambulatory Visit (INDEPENDENT_AMBULATORY_CARE_PROVIDER_SITE_OTHER): Payer: Medicare Other | Admitting: Medical

## 2019-09-15 ENCOUNTER — Encounter: Payer: Self-pay | Admitting: Medical

## 2019-09-15 ENCOUNTER — Other Ambulatory Visit: Payer: Self-pay

## 2019-09-15 ENCOUNTER — Ambulatory Visit: Payer: Medicare Other | Admitting: Cardiovascular Disease

## 2019-09-15 VITALS — BP 165/87 | HR 89 | Ht 64.5 in | Wt 191.8 lb

## 2019-09-15 DIAGNOSIS — E785 Hyperlipidemia, unspecified: Secondary | ICD-10-CM

## 2019-09-15 DIAGNOSIS — I10 Essential (primary) hypertension: Secondary | ICD-10-CM

## 2019-09-15 DIAGNOSIS — I2510 Atherosclerotic heart disease of native coronary artery without angina pectoris: Secondary | ICD-10-CM

## 2019-09-15 DIAGNOSIS — I350 Nonrheumatic aortic (valve) stenosis: Secondary | ICD-10-CM

## 2019-09-15 DIAGNOSIS — Z951 Presence of aortocoronary bypass graft: Secondary | ICD-10-CM | POA: Diagnosis not present

## 2019-09-15 MED ORDER — METOPROLOL SUCCINATE ER 25 MG PO TB24: 25.0000 mg | ORAL_TABLET | Freq: Every day | ORAL | 3 refills | Status: DC

## 2019-09-15 MED ORDER — METOPROLOL SUCCINATE ER 50 MG PO TB24: 50.0000 mg | ORAL_TABLET | Freq: Every day | ORAL | 3 refills | Status: DC

## 2019-09-15 MED ORDER — METOPROLOL SUCCINATE ER 50 MG PO TB24: ORAL_TABLET | ORAL | 3 refills | Status: DC

## 2019-09-15 NOTE — Patient Instructions (Signed)
Medication Instructions:   INCREASE Metoprolol Succinate to 75 mg daily (1.5 tablets) If you need a refill on your cardiac medications before your next appointment, please call your pharmacy.   Lab work: You are due to have labs (blood work) drawn by your PCP in November. Please have them send a copy to our office.  If you have labs (blood work) drawn today and your tests are completely normal, you will receive your results only by: Marland Kitchen MyChart Message (if you have MyChart) OR . A paper copy in the mail If you have any lab test that is abnormal or we need to change your treatment, we will call you to review the results.  Testing/Procedures: NONE ordered at this time of appointment   Follow-Up: At [facility], you and your health needs are our priority.  As part of our continuing mission to provide you with exceptional heart care, we have created designated Provider Care Teams.  These Care Teams include your primary Cardiologist (physician) and Advanced Practice Providers (APPs -  Physician Assistants and Nurse Practitioners) who all work together to provide you with the care you need, when you need it. . You will need a follow up appointment in 6 months-March 2021 with Kathryn Burow, MD   Any Other Special Instructions Will Be Listed Below (If Applicable).  Monitor your blood pressure daily, if your blood pressure is greater than 130/80 give our office a call.

## 2019-09-18 ENCOUNTER — Other Ambulatory Visit: Payer: Self-pay

## 2019-09-18 ENCOUNTER — Telehealth: Payer: Self-pay | Admitting: Cardiovascular Disease

## 2019-09-18 NOTE — Telephone Encounter (Signed)
refill 

## 2019-09-18 NOTE — Telephone Encounter (Signed)
Spoke with pt and advised that chewable form of low dose ASA works faster than ASA designed to be swallowed, but she has the option to swallow her chewable ASA whole. Triage nurse recommended that pt take her chewable ASA with food or milk if she is to swallow it whole so as to avoid stomach upset. Pt states she had talked to a pharmacist from her pharmacy and they recommended that she swallow her chewable tablets but the pharmacist sounded unsure so she wanted call [facility]. Informed pt that it is up to her to decide since chewing or swallowing her ASA are both okay. Pt verbalized understanding

## 2019-09-18 NOTE — Telephone Encounter (Signed)
  Patient wants to know if she can take [employer]ayer chewable low dose aspirin and if its okay for her to swallow it and not chew it.

## 2019-09-20 ENCOUNTER — Telehealth: Payer: Self-pay | Admitting: Cardiovascular Disease

## 2019-09-20 ENCOUNTER — Other Ambulatory Visit: Payer: Self-pay

## 2019-09-20 MED ORDER — METOPROLOL SUCCINATE ER 25 MG PO TB24: 25.0000 mg | ORAL_TABLET | Freq: Every day | ORAL | 3 refills | Status: DC

## 2019-09-20 NOTE — Telephone Encounter (Signed)
At visit on Friday bp was elevated, PA suggested to increase Metoprolol to 1.5 tablet.   Patient has taken it twice-  180/96 on Monday 180/82 on Tuesday   Patient was told she could go to 2 tablets if the BP continued to stay increased, patient would like to check before increasing for a few days.  Patient has no symptoms.  Please advise if okay to increase. Thank you!

## 2019-09-20 NOTE — Telephone Encounter (Signed)
New Message    Patient states she has to run out so you can leave a detailed message on voicemail about the medication issue.

## 2019-09-20 NOTE — Telephone Encounter (Signed)
New Message   Pt c/o medication issue:  1. Name of Medication: Metoprolol Succinate 50mg   2. How are you currently taking this medication (dosage and times per day)? 1 tablet by mouth daily   3. Are you having a reaction (difficulty breathing--STAT)? No  4. What is your medication issue? Patient states she was told to increase the medication and wants to talk to a nurse before she does.

## 2019-09-20 NOTE — Telephone Encounter (Signed)
Noted. Will notify patient- once we receive recommendations.

## 2019-09-21 NOTE — Telephone Encounter (Signed)
Pt notified she states that she has the nurse to take her BP but they are not there on the weekends. She states that she WILL NOT buy one she does not think that they are accurate. She states that she will continue to have the nurse there take her BP. She will continue to monitor. She states that they take her BP in the afternoon. She will follow directions notated.

## 2019-09-21 NOTE — Telephone Encounter (Signed)
Have patient check home BP daily for another 4-5 days.  If the readings continue to be > [id number] systolic she should increase to 2 tablets daily.  Make sure she knows to sit with the cuff on her arm for about 5 minutes before checking pressure, have feet flat on the floor and back supported.  Also be sure to take before a meal or at least 30 minutes after.

## 2019-09-25 ENCOUNTER — Telehealth: Payer: Self-pay | Admitting: Medical

## 2019-09-25 NOTE — Telephone Encounter (Signed)
Spoke with patient who reports her BP is coming down after metoprolol dose increase. She is having her BP checked at [facility] - not same time every day. Her BP on 9/21 was 180/96, on 9/25 was 140/76, today was 126/78 and HR 74 - after making her bed, light housework. Advised to continue current medications, continue to have BP checked for another week or so, and will notify [facility] PA with update.

## 2019-09-25 NOTE — Telephone Encounter (Signed)
Follow Up:   Pt says her blood pressure is coming down. Today it was 126/78 and heart rate was 74.  She wants to know what to do her Metoprolol, it  Had been increased.

## 2019-09-26 NOTE — Telephone Encounter (Signed)
Thanks for the update, Kathryn Carlson! Looks like her blood pressure is certainly trending in the right direction. Agree with continuing her current medications and ongoing monitoring. Thanks!

## 2019-10-10 ENCOUNTER — Telehealth: Payer: Self-pay | Admitting: Cardiovascular Disease

## 2019-10-10 NOTE — Telephone Encounter (Signed)
On 21st medication was changed- Patient is at home care, and they arn't able to check BP every day but when she does she likes to make PA aware. Patient is feeling good. Now it is 126/78 lowest she has seen it. 164/78 yesterday, 134/78 last week. She does see her primary care soon, and the nurse at her home is happy with the BP's coming down. She states she wanted to make you aware of her BP, and make sure of no changes. Advised if so I would let her know.

## 2019-10-10 NOTE — Telephone Encounter (Signed)
New message   Patient states that she would like a call in reference to her b/p checks. Please call to discuss.

## 2019-10-11 NOTE — Telephone Encounter (Signed)
Thanks for the update, Kathryn Carlson! Please tell Kathryn Carlson that I'd still like to get her blood pressure consistently at goal of <130/80. Please increase her metoprolol succinate to 100mg  daily at this time. Thank you!

## 2019-10-13 NOTE — Telephone Encounter (Signed)
Called patient, LVM advising of change in medication- advised patient to monitor BP and to call back in a few weeks with readings or sooner if issues.  Gave call back number.

## 2019-10-20 ENCOUNTER — Telehealth: Payer: Self-pay | Admitting: Medical

## 2019-10-20 NOTE — Telephone Encounter (Signed)
New Message   Pt c/o medication issue:  1. Name of Medication: metoprolol succinate (TOPROL-XL) 50 MG 24 hr tablet   2. How are you currently taking this medication (dosage and times per day)?   3. Are you having a reaction (difficulty breathing--STAT)?   4. What is your medication issue?  Patient is calling because she is is needing clarification on how she should be taking her medication. Please call to discuss.

## 2019-10-20 NOTE — Telephone Encounter (Signed)
Spoke with pt and she was unsure of what Metoprolol dose she was suppose to be on.  Advised, as of 10/14, per Roby Lofts, PA-C, she wanted pt to increase Metoprolol to 100mg  every morning.  Pt states that's what she has been doing but wanted to make sure that was correct.  She will continue to monitor BP and contact the office if BP gets high.

## 2019-10-30 ENCOUNTER — Other Ambulatory Visit: Payer: Self-pay

## 2019-10-30 ENCOUNTER — Other Ambulatory Visit: Payer: Self-pay | Admitting: Cardiovascular Disease

## 2019-10-30 MED ORDER — METOPROLOL SUCCINATE ER 50 MG PO TB24: 50.0000 mg | ORAL_TABLET | Freq: Every day | ORAL | 3 refills | Status: DC

## 2019-10-30 MED ORDER — METOPROLOL SUCCINATE ER 25 MG PO TB24: 25.0000 mg | ORAL_TABLET | Freq: Every day | ORAL | 3 refills | Status: DC

## 2019-10-30 MED ORDER — METOPROLOL SUCCINATE ER 100 MG PO TB24: 100.0000 mg | ORAL_TABLET | Freq: Every day | ORAL | 3 refills | Status: DC

## 2019-10-30 NOTE — Telephone Encounter (Signed)
Medication has been sent and updated.  And sent to pharmacy as 100 mg- as mentioned to do so by PA in previous message.

## 2019-10-30 NOTE — Telephone Encounter (Signed)
Patient is correct- she is increased to 100 mg per PA- submitted to pharmacy and med list updated.

## 2019-10-30 NOTE — Telephone Encounter (Addendum)
Follow up   Patient calling, states Metoprolol should have been filled for 100mg 

## 2019-10-30 NOTE — Telephone Encounter (Signed)
°*  STAT* If patient is at the pharmacy, call can be transferred to refill team.   1. Which medications need to be refilled? (please list name of each medication and dose if known) metoprolol succinate (TOPROL-XL) 50 MG 24 hr tablet  2. Which pharmacy/location (including street and city if local pharmacy) is medication to be sent to? [id number] [place], [place] [address]  3. Do they need a 30 day or 90 day supply? [place]

## 2019-10-31 ENCOUNTER — Telehealth: Payer: Self-pay | Admitting: Cardiovascular Disease

## 2019-10-31 NOTE — Telephone Encounter (Signed)
Patient is concerned she has the correct  Dosage at the pharmacy to pick up.  Patient was recently changed to 100 mg metoprolol succinate. The 50 mg tablet and 25 mg tablet needs to be discontinue on patient's medication profile. Patient would like only one medication filled - 100 mg tablet  she is aware.   RN contacted Pharmacy to verify.  Change was made and verified with pharmacy tech-jennifer   patient aware

## 2019-10-31 NOTE — Telephone Encounter (Signed)
New Message     Pt is calling about the Metoprolol. She said no one called her back yesterday, and she is upset about that.  She would like for someone to call her back, she said the pharmacy called her and they have the wrong Mg of Metoprolol    Please call

## 2019-11-09 ENCOUNTER — Other Ambulatory Visit: Payer: Self-pay | Admitting: Cardiovascular Disease

## 2019-11-13 DIAGNOSIS — E039 Hypothyroidism, unspecified: Secondary | ICD-10-CM | POA: Diagnosis not present

## 2019-11-13 DIAGNOSIS — E785 Hyperlipidemia, unspecified: Secondary | ICD-10-CM | POA: Diagnosis not present

## 2019-11-13 DIAGNOSIS — D638 Anemia in other chronic diseases classified elsewhere: Secondary | ICD-10-CM | POA: Diagnosis not present

## 2019-11-13 DIAGNOSIS — M6281 Muscle weakness (generalized): Secondary | ICD-10-CM | POA: Diagnosis not present

## 2019-11-13 DIAGNOSIS — I350 Nonrheumatic aortic (valve) stenosis: Secondary | ICD-10-CM | POA: Diagnosis not present

## 2019-11-13 DIAGNOSIS — I6529 Occlusion and stenosis of unspecified carotid artery: Secondary | ICD-10-CM | POA: Diagnosis not present

## 2019-11-13 DIAGNOSIS — M1990 Unspecified osteoarthritis, unspecified site: Secondary | ICD-10-CM | POA: Diagnosis not present

## 2019-11-13 DIAGNOSIS — M8580 Other specified disorders of bone density and structure, unspecified site: Secondary | ICD-10-CM | POA: Diagnosis not present

## 2019-11-13 DIAGNOSIS — R002 Palpitations: Secondary | ICD-10-CM | POA: Diagnosis not present

## 2019-11-13 DIAGNOSIS — Z0000 Encounter for general adult medical examination without abnormal findings: Secondary | ICD-10-CM | POA: Diagnosis not present

## 2019-11-13 DIAGNOSIS — I10 Essential (primary) hypertension: Secondary | ICD-10-CM | POA: Diagnosis not present

## 2019-11-13 DIAGNOSIS — I25810 Atherosclerosis of coronary artery bypass graft(s) without angina pectoris: Secondary | ICD-10-CM | POA: Diagnosis not present

## 2019-11-17 DIAGNOSIS — M7581 Other shoulder lesions, right shoulder: Secondary | ICD-10-CM | POA: Diagnosis not present

## 2019-11-17 DIAGNOSIS — M25511 Pain in right shoulder: Secondary | ICD-10-CM | POA: Diagnosis not present

## 2019-11-17 DIAGNOSIS — I129 Hypertensive chronic kidney disease with stage 1 through stage 4 chronic kidney disease, or unspecified chronic kidney disease: Secondary | ICD-10-CM | POA: Diagnosis not present

## 2019-11-17 DIAGNOSIS — N1831 Chronic kidney disease, stage 3a: Secondary | ICD-10-CM | POA: Diagnosis not present

## 2019-12-06 DIAGNOSIS — M25511 Pain in right shoulder: Secondary | ICD-10-CM | POA: Diagnosis not present

## 2019-12-06 DIAGNOSIS — M7541 Impingement syndrome of right shoulder: Secondary | ICD-10-CM | POA: Diagnosis not present

## 2019-12-26 ENCOUNTER — Telehealth: Payer: Self-pay | Admitting: Cardiovascular Disease

## 2019-12-26 NOTE — Telephone Encounter (Signed)
Called and left VM for pt letting her know that Dr [name] can not comment on the Covid Vaccination right now. Advised pt to call PCP.

## 2019-12-26 NOTE — Telephone Encounter (Signed)
Sent to MD

## 2019-12-26 NOTE — Telephone Encounter (Signed)
Patient calling wanting Dr. Kennon Holter recommendation on whether she should get the COVID vaccine. She says her decision has to be made tomorrow and would like a call back today.

## 2020-01-01 DIAGNOSIS — Z23 Encounter for immunization: Secondary | ICD-10-CM | POA: Diagnosis not present

## 2020-01-03 ENCOUNTER — Other Ambulatory Visit: Payer: Self-pay | Admitting: Cardiovascular Disease

## 2020-01-03 DIAGNOSIS — I10 Essential (primary) hypertension: Secondary | ICD-10-CM

## 2020-01-11 ENCOUNTER — Telehealth: Payer: Self-pay | Admitting: Cardiovascular Disease

## 2020-01-11 DIAGNOSIS — T148XXA Other injury of unspecified body region, initial encounter: Secondary | ICD-10-CM | POA: Diagnosis not present

## 2020-01-11 DIAGNOSIS — R600 Localized edema: Secondary | ICD-10-CM | POA: Diagnosis not present

## 2020-01-11 NOTE — Telephone Encounter (Signed)
  Pt c/o swelling: STAT is pt has developed SOB within 24 hours  1) How much weight have you gained and in what time span? Not sure  2) If swelling, where is the swelling located? Left foot  3) Are you currently taking a fluid pill? no  4) Are you currently SOB? no  5) Do you have a log of your daily weights (if so, list)? no  6) Have you gained 3 pounds in a day or 5 pounds in a week? Not sure  7) Have you traveled recently? no   Patient calling stating her left foot is swollen and there is a blue spot on her left shin that is about 3in long and 2in wide. She states the nurse in her assisted living told her it may be a blood clot. She says she would like to have an appointment today and if not she will be making arrangements to go to the ED. She would like a call back with advice.

## 2020-01-11 NOTE — Telephone Encounter (Signed)
Pt has noticed left foot swelling for the past few days that has been on and off but this morning she woke up and it was swollen, numb on the bottom of the foot and she noticed a blue tinged spot on her shin a few inches above the swelling.. she denies pain, redness, heat,  but she is concerned and has already made arrangements for someone to take her to the [facility] urgent care close to where she resides. She will call us back after being seen with their assessment and to hopefully have a clot ruled out.

## 2020-01-29 DIAGNOSIS — Z23 Encounter for immunization: Secondary | ICD-10-CM | POA: Diagnosis not present

## 2020-01-31 ENCOUNTER — Other Ambulatory Visit: Payer: Self-pay | Admitting: Cardiovascular Disease

## 2020-02-01 NOTE — Telephone Encounter (Signed)
Rx has been sent to the pharmacy electronically. ° °

## 2020-02-02 ENCOUNTER — Telehealth: Payer: Self-pay | Admitting: Cardiovascular Disease

## 2020-02-02 NOTE — Telephone Encounter (Signed)
Called patient, advised that they are having the ECHO repeat from last year.  Patient verbalized understanding, she will call Monday to get it scheduled.

## 2020-02-02 NOTE — Telephone Encounter (Signed)
Patient wanted to speak with a Nurse in regards to the orders for an Echocardiogram. She is definitely willing to come in and have the test done, but she just wants to be 100% sure of what to do

## 2020-02-07 DIAGNOSIS — H353131 Nonexudative age-related macular degeneration, bilateral, early dry stage: Secondary | ICD-10-CM | POA: Diagnosis not present

## 2020-02-07 DIAGNOSIS — H5211 Myopia, right eye: Secondary | ICD-10-CM | POA: Diagnosis not present

## 2020-02-07 DIAGNOSIS — H4031X2 Glaucoma secondary to eye trauma, right eye, moderate stage: Secondary | ICD-10-CM | POA: Diagnosis not present

## 2020-02-07 DIAGNOSIS — H04123 Dry eye syndrome of bilateral lacrimal glands: Secondary | ICD-10-CM | POA: Diagnosis not present

## 2020-03-04 ENCOUNTER — Other Ambulatory Visit: Payer: Self-pay

## 2020-03-04 ENCOUNTER — Ambulatory Visit (HOSPITAL_COMMUNITY): Payer: Medicare Other | Attending: Cardiology

## 2020-03-04 DIAGNOSIS — I252 Old myocardial infarction: Secondary | ICD-10-CM | POA: Insufficient documentation

## 2020-03-04 DIAGNOSIS — I10 Essential (primary) hypertension: Secondary | ICD-10-CM | POA: Diagnosis not present

## 2020-03-04 DIAGNOSIS — I350 Nonrheumatic aortic (valve) stenosis: Secondary | ICD-10-CM | POA: Insufficient documentation

## 2020-03-04 DIAGNOSIS — E669 Obesity, unspecified: Secondary | ICD-10-CM | POA: Diagnosis not present

## 2020-03-04 DIAGNOSIS — E785 Hyperlipidemia, unspecified: Secondary | ICD-10-CM | POA: Diagnosis not present

## 2020-03-04 DIAGNOSIS — I2510 Atherosclerotic heart disease of native coronary artery without angina pectoris: Secondary | ICD-10-CM | POA: Diagnosis not present

## 2020-03-04 DIAGNOSIS — Z951 Presence of aortocoronary bypass graft: Secondary | ICD-10-CM | POA: Diagnosis not present

## 2020-03-11 ENCOUNTER — Other Ambulatory Visit: Payer: Self-pay | Admitting: Cardiovascular Disease

## 2020-03-13 ENCOUNTER — Other Ambulatory Visit: Payer: Self-pay | Admitting: Cardiovascular Disease

## 2020-03-13 DIAGNOSIS — I10 Essential (primary) hypertension: Secondary | ICD-10-CM

## 2020-03-15 ENCOUNTER — Encounter: Payer: Self-pay | Admitting: Cardiovascular Disease

## 2020-03-15 ENCOUNTER — Other Ambulatory Visit: Payer: Self-pay

## 2020-03-15 ENCOUNTER — Ambulatory Visit (INDEPENDENT_AMBULATORY_CARE_PROVIDER_SITE_OTHER): Payer: Medicare Other | Admitting: Cardiovascular Disease

## 2020-03-15 DIAGNOSIS — Z951 Presence of aortocoronary bypass graft: Secondary | ICD-10-CM | POA: Diagnosis not present

## 2020-03-15 DIAGNOSIS — I10 Essential (primary) hypertension: Secondary | ICD-10-CM | POA: Diagnosis not present

## 2020-03-15 DIAGNOSIS — E785 Hyperlipidemia, unspecified: Secondary | ICD-10-CM | POA: Diagnosis not present

## 2020-03-15 DIAGNOSIS — I350 Nonrheumatic aortic (valve) stenosis: Secondary | ICD-10-CM

## 2020-03-15 NOTE — Assessment & Plan Note (Signed)
History of dyslipidemia on statin therapy with lipid profile performed 10/31/2018 revealing total cholesterol 155, LDL 71 HDL 43.

## 2020-03-15 NOTE — Assessment & Plan Note (Signed)
History of essential hypertension with blood pressure measured today at 136/84.  She is on metoprolol and Benicar.

## 2020-03-15 NOTE — Assessment & Plan Note (Signed)
History of CAD status post coronary bypass grafting x3 by Dr. Darcey Nora 1997 with a LIMA to LAD, vein to intermediate branch, obtuse marginal branch and PDA PLA sequentially.  Myoview performed 2013 was nonischemic.  She denies chest pain or shortness of breath.

## 2020-03-15 NOTE — Assessment & Plan Note (Signed)
History of mild aortic stenosis by recent 2D echo performed 03/04/2020 with moderate aortic insufficiency and normal LV function.

## 2020-03-15 NOTE — Patient Instructions (Signed)
Medication Instructions:  NO CHANGE *If you need a refill on your cardiac medications before your next appointment, please call your pharmacy*   Lab Work: If you have labs (blood work) drawn today and your tests are completely normal, you will receive your results only by: . MyChart Message (if you have MyChart) OR . A paper copy in the mail If you have any lab test that is abnormal or we need to change your treatment, we will call you to review the results.   Follow-Up: At [facility], you and your health needs are our priority.  As part of our continuing mission to provide you with exceptional heart care, we have created designated Provider Care Teams.  These Care Teams include your primary Cardiologist (physician) and Advanced Practice Providers (APPs -  Physician Assistants and Nurse Practitioners) who all work together to provide you with the care you need, when you need it.  We recommend signing up for the patient portal called "MyChart".  Sign up information is provided on this After Visit Summary.  MyChart is used to connect with patients for Virtual Visits (Telemedicine).  Patients are able to view lab/test results, encounter notes, upcoming appointments, etc.  Non-urgent messages can be sent to your provider as well.   To learn more about what you can do with MyChart, go to [email or web address].    Your next appointment:   12 month(s)  The format for your next appointment:   In Person  Provider:   You may see Jonathan Berry, MD or one of the following Advanced Practice Providers on your designated Care Team:    Luke Kilroy, PA-C  Callie Goodrich, PA-C  Jesse Cleaver, FNP     

## 2020-03-15 NOTE — Progress Notes (Signed)
03/15/2020 Kathryn Carlson   1927/05/26  ON:[id number]  Primary Physician Burnard Bunting, MD Primary Cardiologist: Lorretta Harp MD FACP, [place], [place], [place]  HPI:  Kathryn Carlson is a 86 y.o.  moderately overweight widowed Caucasian female mother of 4 children who transitioned her care from Dr. Debara Pickett to myself at her request.  She lost one of her 2 sons several years ago from dementia. I last saw her in the office  03/04/2018.She was formally a patient of Dr. Lowella Fairy. She has a history of ischemic heart disease status post myocardial infarction at age 28. She had coronary artery bypass grafting in 1997 by Dr. Prescott Gum Her last Myoview performed in 2013 was nonischemic. Her other Problems include symptomatic PVCs evaluated by Dr. Rayann Heman in the past as well as history of hypertension and hyperlipidemia. She denies chest pain or shortness of breath since I saw her in the office one year ago. Echo performed 01/28/18 revealed normal LV systolic function with mild aortic stenosis.  Since I saw her a year and a half ago she continues to do well.  She lives independently in a assisted care facility.  She denies chest pain or shortness of breath.  Recent 2D echo performed 03/04/2020 showed normal LV function, mild AS and moderate AI.       Current Meds  Medication Sig  . aspirin 81 MG tablet Take 81 mg by mouth daily.   Marland Kitchen atorvastatin (LIPITOR) 40 MG tablet TAKE 1 TABLET BY MOUTH EVERY DAY  . chloridazePOXIDE-amitriptyline (LIMBITROL) 5-12.5 MG per tablet Take 1 tablet by mouth daily.  . cholecalciferol (VITAMIN D) 1000 UNITS tablet Take 2,000 Units by mouth daily.  Marland Kitchen estradiol (ESTRACE) 1 MG tablet Take 1 mg by mouth daily.  . hydrocortisone 2.5 % cream hydrocortisone 2.5 % topical cream  APPLY TO AFFECTED AREA TWICE A DAY  . Influenza vac split quadrivalent PF (FLUZONE HIGH-DOSE) 0.5 ML injection Fluzone High-Dose 2019-20 (PF) 180 mcg/0.5 mL intramuscular syringe  TO BE ADMINISTERED  BY PHARMACIST FOR IMMUNIZATION  . isosorbide mononitrate (IMDUR) 30 MG 24 hr tablet TAKE 1 TABLET BY MOUTH EVERY DAY  . levothyroxine (SYNTHROID, LEVOTHROID) 75 MCG tablet Take 75 mcg by mouth daily before breakfast.  . metoprolol succinate (TOPROL-XL) 100 MG 24 hr tablet Take 1 tablet (100 mg total) by mouth daily. Take with or immediately following a meal.  . Multiple Vitamin (MULTIVITAMIN) capsule Take 1 capsule by mouth daily.  . niacin 500 MG tablet Take 500 mg by mouth daily with breakfast.  . olmesartan (BENICAR) 40 MG tablet Take 1 tablet (40 mg total) by mouth daily. PATIENT NEEDS OV FOR FUTURE REFILLS  . pantoprazole (PROTONIX) 40 MG tablet TAKE 1 TABLET BY MOUTH EVERY DAY (Patient taking differently: Take by mouth as needed. )  . TIMOLOL HEMIHYDRATE OP Place 1 drop into the right eye daily.  Marland Kitchen VITAMIN E PO Take 400 mg by mouth daily. Plus D   Current Facility-Administered Medications for the 03/15/20 encounter (Office Visit) with Lorretta Harp, MD  Medication  . triamcinolone acetonide (KENALOG-40) injection 40 mg     Allergies  Allergen Reactions  . Novocain [Procaine]     Social History   Socioeconomic History  . Marital status: Widowed    Spouse name: Not on file  . Number of children: 4  . Years of education: 49  . Highest education level: Not on file  Occupational History  . Not on file  Tobacco Use  .  Smoking status: Never Smoker  . Smokeless tobacco: Never Used  Substance and Sexual Activity  . Alcohol use: Yes    Comment: "a drink of wine every now and then"  . Drug use: Not on file  . Sexual activity: Not on file  Other Topics Concern  . Not on file  Social History Narrative   Lives in [place] Strain:   . Difficulty of Paying Living Expenses:   Food Insecurity:   . Worried About Charity fundraiser in the Last Year:   . Arboriculturist in the Last Year:   Transportation Needs:   . Consulting civil engineer (Medical):   Marland Kitchen Lack of Transportation (Non-Medical):   Physical Activity:   . Days of Exercise per Week:   . Minutes of Exercise per Session:   Stress:   . Feeling of Stress :   Social Connections:   . Frequency of Communication with Friends and Family:   . Frequency of Social Gatherings with Friends and Family:   . Attends Religious Services:   . Active Member of Clubs or Organizations:   . Attends Archivist Meetings:   Marland Kitchen Marital Status:   Intimate Partner Violence:   . Fear of Current or Ex-Partner:   . Emotionally Abused:   Marland Kitchen Physically Abused:   . Sexually Abused:      Review of Systems: General: negative for chills, fever, night sweats or weight changes.  Cardiovascular: negative for chest pain, dyspnea on exertion, edema, orthopnea, palpitations, paroxysmal nocturnal dyspnea or shortness of breath Dermatological: negative for rash Respiratory: negative for cough or wheezing Urologic: negative for hematuria Abdominal: negative for nausea, vomiting, diarrhea, bright red blood per rectum, melena, or hematemesis Neurologic: negative for visual changes, syncope, or dizziness All other systems reviewed and are otherwise negative except as noted above.    Blood pressure 136/84, pulse 84, height 5' 4.5" (1.638 m), weight 188 lb 9.6 oz (85.5 kg), SpO2 96 %.  General appearance: alert and no distress Neck: no adenopathy, no carotid bruit, no JVD, supple, symmetrical, trachea midline and thyroid not enlarged, symmetric, no tenderness/mass/nodules Lungs: clear to auscultation bilaterally Heart: 2/6 outflow tract murmur consistent with aortic stenosis. Extremities: extremities normal, atraumatic, no cyanosis or edema Pulses: 2+ and symmetric Skin: Skin color, texture, turgor normal. No rashes or lesions Neurologic: Alert and oriented X 3, normal strength and tone. Normal symmetric reflexes. Normal coordination and gait  EKG not performed today   ASSESSMENT AND PLAN:   HTN (hypertension) History of essential hypertension with blood pressure measured today at 136/84.  She is on metoprolol and Benicar.  Dyslipidemia History of dyslipidemia on statin therapy with lipid profile performed 10/31/2018 revealing total cholesterol 155, LDL 71 HDL 43.  S/P CABG x 3 History of CAD status post coronary bypass grafting x3 by Dr. Darcey Nora 1997 with a LIMA to LAD, vein to intermediate branch, obtuse marginal branch and PDA PLA sequentially.  Myoview performed 2013 was nonischemic.  She denies chest pain or shortness of breath.  Aortic stenosis, mild History of mild aortic stenosis by recent 2D echo performed 03/04/2020 with moderate aortic insufficiency and normal LV function.      Lorretta Harp MD FACP,FACC,FAHA, [facility] 03/15/2020 3:04 PM

## 2020-03-19 ENCOUNTER — Telehealth: Payer: Self-pay | Admitting: Dermatology

## 2020-03-19 NOTE — Telephone Encounter (Signed)
Much of the scale, inflammation, crusting is related to low risk skin cancer ("carcinoma in situ"). I do not have a medication thatcan be used on the scalp to treat this.

## 2020-03-19 NOTE — Telephone Encounter (Signed)
Having more scalp problems.  Doesn't want appointment.  Wants to know if we could recommend or prescribe medication.  [facility]   Chart # [id number]

## 2020-03-19 NOTE — Telephone Encounter (Signed)
Phone call to patient to give her Dr. Onalee Hua response to her scalp issues.  Patient aware of Dr. Onalee Hua response.

## 2020-03-27 ENCOUNTER — Encounter: Payer: Self-pay | Admitting: *Deleted

## 2020-03-27 ENCOUNTER — Ambulatory Visit (INDEPENDENT_AMBULATORY_CARE_PROVIDER_SITE_OTHER): Payer: Medicare Other | Admitting: Dermatology

## 2020-03-27 ENCOUNTER — Other Ambulatory Visit: Payer: Self-pay

## 2020-03-27 DIAGNOSIS — D044 Carcinoma in situ of skin of scalp and neck: Secondary | ICD-10-CM | POA: Diagnosis not present

## 2020-03-27 DIAGNOSIS — D099 Carcinoma in situ, unspecified: Secondary | ICD-10-CM

## 2020-03-27 NOTE — Patient Instructions (Addendum)
Mrs. Bridwell returns with continued enlargement of crusting on the left upper forehead and frontal scalp.  The area now measures 7 x 7 cm.  I explained to Mrs. Lydy that this almost certainly is a low risk, superficial, nonmelanoma skin cancer.  If it were available, I would treat this with superficial radiation therapy.  I do not think that  Mohs surgery would be appropriate.  Mrs. Gindlesperger understandably strongly wanted something done for this uncomfortable area, so after local anesthetic I did essentially abrade the entire area first with a razor blade and then with a curette.  Minimal bleeding was stopped with Monsel solution.  Mrs. States understands that the hope would be to see 80 to 90% improvement that would last for 1 or 2 or 3 years.  She is instructed to apply Neosporin or triple antibiotic every day.  If she wants to cover the area during the initial healing, I believe an eye patch would easily cover this area and be easy for her to manipulate.  If needed she will keep this in place with paper tape.  She has no restrictions as far as washing or showering or getting a shampoo.  I have asked her to please call me every 1 to 2weeks to give me a status report.    1. Okay to remove bandage in 24 hours  2. Wash area with soap and water  3. Apply Vaseline to area twice daily until healed (Not Neosporin)  4. Okay to cover with a Band-Aid to decrease the chance of infection or prevent irritation from clothing; also it's okay to uncover lesion at home.  5. Suture instructions: return to our office in 7-10 or 10-14 days for a nurse visit for suture removal. Variable healing with sutures, if pain or itching occurs call our office. It's okay to shower or bathe 24 hours after sutures are given.  6. The following risks may occur after a biopsy, curettage or excision: bleeding, scarring, discoloration, recurrence, infection (redness, yellow drainage, pain or swelling).  7. For questions, concerns  and results call our office at [place] before 4pm & Friday before 3pm. Biopsy results will be available in 1 week.

## 2020-04-08 ENCOUNTER — Telehealth: Payer: Self-pay | Admitting: Cardiovascular Disease

## 2020-04-08 DIAGNOSIS — I10 Essential (primary) hypertension: Secondary | ICD-10-CM

## 2020-04-08 MED ORDER — OLMESARTAN MEDOXOMIL 40 MG PO TABS: 40.0000 mg | ORAL_TABLET | Freq: Every day | ORAL | 3 refills | Status: DC

## 2020-04-08 NOTE — Telephone Encounter (Signed)
Patient states she is calling to ensure that she is eligible to call in future refills for olmesartan (BENICAR) 40 MG tablet medication due to note reading, "PATIENT NEEDS OV FOR FUTURE REFILLS".

## 2020-04-08 NOTE — Telephone Encounter (Signed)
Pt calling stating her last refill for Benicar stated she need an OV for refill. Pt requesting new Rx as she completed recent OV on 03/09/20 with Dr. [name].  New Rx sent to pharmacy.

## 2020-05-13 NOTE — Progress Notes (Signed)
   Follow-Up Visit   Subjective  Kathryn Carlson is a 86 y.o. female who presents for the following: Skin Problem (Check spot on the scalp, left side. Patient says it got worse in the last few days. She has been usung betamethasone cream on her scalp. ).   The following portions of the chart were reviewed this encounter and updated as appropriate:     Objective  Well appearing patient in no apparent distress; mood and affect are within normal limits.  A focused examination was performed including scalp. Relevant physical exam findings are noted in the Assessment and Plan.   Assessment & Plan  Squamous cell carcinoma in situ Left Front scalp  Destruction of lesion Complexity: extensive   Destruction method: electrodesiccation and curettage   Informed consent: discussed and consent obtained   Timeout:  patient name, date of birth, surgical site, and procedure verified Procedure prep:  Patient was prepped and draped in usual sterile fashion Prep type:  Chlorhexidine Anesthesia: the lesion was anesthetized in a standard fashion   Anesthetic:  1% lidocaine w/ epinephrine 1-100,000 local infiltration Curettage performed in three different directions: Yes   Electrodesiccation performed over the curetted area: Yes   Curettage cycles:  3 Lesion length (cm):  7.5 Lesion width (cm):  7 Margin per side (cm):  0 Final wound size (cm):  7.5 Hemostasis achieved with:  pressure and ferric subsulfate Outcome: patient tolerated procedure well with no complications   Post-procedure details: sterile dressing applied and wound care instructions given   Dressing type: bandage and petrolatum   Additional details:  Base inoculated with parenteral 5FU  Specimen 1 - Surgical pathology Differential Diagnosis: scc vs bcc Check Margins:  Previous biopsy-[id number] Cur[place] after biopsy  Razor blade removal of essentially all involved epidermis, base curetted x3, small bleeders cauterized, base  inoculated with parenteral 5FU.

## 2020-05-20 DIAGNOSIS — F329 Major depressive disorder, single episode, unspecified: Secondary | ICD-10-CM | POA: Diagnosis not present

## 2020-05-20 DIAGNOSIS — I25810 Atherosclerosis of coronary artery bypass graft(s) without angina pectoris: Secondary | ICD-10-CM | POA: Diagnosis not present

## 2020-05-20 DIAGNOSIS — Z1331 Encounter for screening for depression: Secondary | ICD-10-CM | POA: Diagnosis not present

## 2020-05-20 DIAGNOSIS — I10 Essential (primary) hypertension: Secondary | ICD-10-CM | POA: Diagnosis not present

## 2020-05-20 DIAGNOSIS — M8580 Other specified disorders of bone density and structure, unspecified site: Secondary | ICD-10-CM | POA: Diagnosis not present

## 2020-05-20 DIAGNOSIS — I350 Nonrheumatic aortic (valve) stenosis: Secondary | ICD-10-CM | POA: Diagnosis not present

## 2020-05-20 DIAGNOSIS — M1990 Unspecified osteoarthritis, unspecified site: Secondary | ICD-10-CM | POA: Diagnosis not present

## 2020-05-20 DIAGNOSIS — R002 Palpitations: Secondary | ICD-10-CM | POA: Diagnosis not present

## 2020-05-20 DIAGNOSIS — E039 Hypothyroidism, unspecified: Secondary | ICD-10-CM | POA: Diagnosis not present

## 2020-05-20 DIAGNOSIS — K219 Gastro-esophageal reflux disease without esophagitis: Secondary | ICD-10-CM | POA: Diagnosis not present

## 2020-05-20 DIAGNOSIS — I6529 Occlusion and stenosis of unspecified carotid artery: Secondary | ICD-10-CM | POA: Diagnosis not present

## 2020-05-20 DIAGNOSIS — E669 Obesity, unspecified: Secondary | ICD-10-CM | POA: Diagnosis not present

## 2020-07-15 ENCOUNTER — Telehealth: Payer: Self-pay | Admitting: Dermatology

## 2020-07-15 NOTE — Telephone Encounter (Signed)
Phone call to patient to inform her that we would have to see her about the spot on her leg we can't give medical advice without seeing her.  Patient aware, patient made an appointment to see Dr. Denna Haggard at his next available appointment.

## 2020-07-15 NOTE — Telephone Encounter (Signed)
Question about place on her leg before she makes an appointment

## 2020-07-27 ENCOUNTER — Other Ambulatory Visit: Payer: Self-pay | Admitting: Cardiovascular Disease

## 2020-08-29 ENCOUNTER — Ambulatory Visit: Payer: Medicare Other | Admitting: Dermatology

## 2020-09-03 ENCOUNTER — Telehealth: Payer: Self-pay | Admitting: Cardiovascular Disease

## 2020-09-03 DIAGNOSIS — I10 Essential (primary) hypertension: Secondary | ICD-10-CM

## 2020-09-03 NOTE — Telephone Encounter (Signed)
Pt c/o swelling: STAT is pt has developed SOB within 24 hours  1) How much weight have you gained and in what time span? Doesn't know   2) If swelling, where is the swelling located? Feet & ankles   3) Are you currently taking a fluid pill? No   4) Are you currently SOB? No   5) Do you have a log of your daily weights (if so, list)? Nurse does, but does not have it with her at this time   6) Have you gained 3 pounds in a day or 5 pounds in a week? Not sure   7) Have you traveled recently? No   Kathryn Carlson is calling stating she is having swelling mainly in her feet and some in her ankles as well. She states she is unsure of the amount of weight she has gained, but will try to get it from the nurse for when she receives a callback.  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 [name] states she does not have any pain anywhere else and did not notice the swelling until the staff member brought it to her attention. Please advise.

## 2020-09-03 NOTE — Telephone Encounter (Signed)
Called patient back about message. Patient is complaining about swollen feet and ankles. Patient stated they are a little more swollen today, because she has been moving and walking a lot due to it being her birthday. Patient stated one of the nurses at her facility stated she might need some lasix. Patient would like to take a diuretic to help with the swelling. Patient stated her BP and HR have been normal. Encouraged patient to keep her feet elevated and reduce her salt intake. Patient stated she has been doing this. Informed patient that a message will be sent to Dr. [name] for advisement.

## 2020-09-04 MED ORDER — HYDROCHLOROTHIAZIDE 12.5 MG PO CAPS: 12.5000 mg | ORAL_CAPSULE | Freq: Every day | ORAL | 3 refills | Status: DC

## 2020-09-04 NOTE — Telephone Encounter (Signed)
You can start her on HCTZ 12.5 mg/d and check a BMET in 10-14 days. Needs a F/U with an APP in 4-6 weeks to check

## 2020-09-04 NOTE — Telephone Encounter (Signed)
Returned call to Pt.  Advised to start HCTZ 12.5 mg one tablet by mouth daily in the AM.  She will go to [facility] office on September 20/21 for BMP  Follow up appt made for 4 weeks with APP  Pt states she will call back if she has any further questions.

## 2020-09-11 ENCOUNTER — Telehealth: Payer: Self-pay | Admitting: Dermatology

## 2020-09-11 NOTE — Telephone Encounter (Signed)
Called patient and informed her that oct 21 was dr tafeen first available appointment.

## 2020-09-11 NOTE — Telephone Encounter (Signed)
Scheduled for an ov 10/21, wants something sooner, and insists she needs a surgical appt. Is she right?

## 2020-09-19 ENCOUNTER — Other Ambulatory Visit: Payer: Self-pay

## 2020-09-19 DIAGNOSIS — I10 Essential (primary) hypertension: Secondary | ICD-10-CM | POA: Diagnosis not present

## 2020-09-20 LAB — BASIC METABOLIC PANEL
BUN/Creatinine Ratio: 28 (ref 12–28)
BUN: 34 mg/dL (ref 10–36)
CO2: 24 mmol/L (ref 20–29)
Calcium: 9.3 mg/dL (ref 8.7–10.3)
Chloride: 100 mmol/L (ref 96–106)
Creatinine, Ser: 1.23 mg/dL — ABNORMAL HIGH (ref 0.57–1.00)
GFR calc Af Amer: 44 mL/min/{1.73_m2} — ABNORMAL LOW (ref >59)
GFR calc non Af Amer: 38 mL/min/{1.73_m2} — ABNORMAL LOW (ref >59)
Glucose: 123 mg/dL — ABNORMAL HIGH (ref 65–99)
Potassium: 5.1 mmol/L (ref 3.5–5.2)
Sodium: 137 mmol/L (ref 134–144)

## 2020-09-25 NOTE — Progress Notes (Signed)
Cardiology Office Note:    Date:  10/02/2020   ID:  Kathryn Carlson, DOB [birth year], MRN [id number]  PCP:  Burnard Bunting, MD  Cardiologist:  Quay Burow, MD  Electrophysiologist:  None   Referring MD: Burnard Bunting, MD   Chief Complaint:  lower extremity edema  History of Present Illness:    Kathryn Carlson is a 86 y.o. female with a history of CAD s/p remote CABG in 1997, PVCs, hypertension, hyperlipidemia, hypothyroidism, GERD, and skin cancer who is followed by Dr. [name] and presents today for follow-up of lower extremity swelling.  Patient is former patient of Dr. Rollene Fare and Dr. Debara Pickett and now follows with Dr. [name].She has a history of MI at the age of 36 and underwent CABG in 1997 by Dr. Nils Pyle. Her last ischemic work-up was a Myoview in 2013 which showed no ischemia. Last Echo in 02/2020 showed LVEF of 50-55% with normal wall motion, grade 2 diastolic dysfunction, moderate AI, mild AS, and mildly elevated PASP. She was last seen by Dr. [name] in 02/2020 at which time she was doing well from a cardiac standpoint.   Patient called our office 09/03/2020 with concerns about swelling in her feet and ankles. She was started on HCTZ 12.5mg  daily and this appointment was scheduled for follow-up.   Patient here today alone. She still has some lower extremity edema but thinks the HCTZ has helped. Her creatinine did increase slightly with this so will recheck BMET today to ensure this is stable. She notes mild shortness of breath with activity that sounds like it is mostly due to deconditioning. No shortness of breath at rest. No orthopnea or PND. Weight stable from last visit. No palpitations, lightheadedness, dizziness. She does not some numbness in both feel (slightly worse in left) but mentions she has some neuropathy. Her biggest complaint today is concerns about her memory. She states her memory has not been as good as it used to be but this is not new - she has noticed this over  the last several years. However, a couple of weeks ago she had an episode where she could not think of her grandson's name while praying and this greatly concerned. She is wondering if she could have had a TIA. This was a isolated episode and states it has not happened again. She also describes some insomnia.  Past Medical History:  Diagnosis Date   BCC (basal cell carcinoma of skin) 01/09/2009   Lower Central Back (tx p bx)   CAD (coronary artery disease)    possible ant wall MI ('97) - cath & CABG x5   Exogenous obesity    GERD (gastroesophageal reflux disease)    History of nuclear stress test 09/2012   lexiscan; mild perfusion defect in apical anterior & apical region (infarct/scar) - no significant ischemia, low risk    History of total bilateral knee replacement    Hypertension    Hypothyroidism    Nodular basal cell carcinoma (BCC) 08/17/2017   Left Calf (tx p bx)   PVC's (premature ventricular contractions)    SCC (squamous cell carcinoma) [place] 01/29/2004   Right Forearm (Cx3,5FU)   SCCA (squamous cell carcinoma) of skin 03/13/2005   Mid Upper Back   SCCA (squamous cell carcinoma) of skin 09/07/2005   Left Elbow(Cx3,5FU)   SCCA (squamous cell carcinoma) of skin 01/25/2006   Left Brow(in situ) (Cx3,5FU)   SCCA (squamous cell carcinoma) of skin 04/27/2006   Inner Left Shoulder(Keratoacanthoma) (Exc.)   SCCA (squamous  cell carcinoma) of skin 03/22/2007   Left Temple(in situ) and Bridge of Nose(in situ) (Cs3,5FU)   SCCA (squamous cell carcinoma) of skin 05/12/2007   Top of Scalp(Cx3,5FU)   SCCA (squamous cell carcinoma) of skin 01/28/2011   Right Upper Arm(in situ)   SCCA (squamous cell carcinoma) of skin 07/26/2012   Glabella, Inferior Tip (Cx3,5FU)   SCCA (squamous cell carcinoma) of skin 03/08/2013   Left Front Scalp(in situ) and Upper Nose(in situ) (Cx3,5FU)   SCCA (squamous cell carcinoma) of skin 07/24/2013   Right Lower Leg(Keratoacanthoma)    SCCA (squamous cell carcinoma) of skin 09/11/2015   Left Scalp(in situ) (Cx3,5FU)   SCCA (squamous cell carcinoma) of skin 01/15/2016   Left Forearm(in situ) and Left Temple(in situ) (tx p bx)   SCCA (squamous cell carcinoma) of skin 10/01/2016   Left Mid Forearm(in situ)(tx p bx) and Left Front Scalp(watch)   SCCA (squamous cell carcinoma) of skin 11/12/2016   Left Temple(Keratoacanthoma) (watch)   SCCA (squamous cell carcinoma) of skin 02/11/2017   Top Left Hand(Keratoacanthoma) (tx p bx)   Squamous cell carcinoma in situ (SCCIS) 11/18/1999   Above Left Outer Eyebrow   Squamous cell carcinoma of scalp    removed - Dr. Denna Haggard   Superficial basal cell carcinoma (BCC) 07/14/2004   Left Scapula (Cx3,5FU)    Past Surgical History:  Procedure Laterality Date   ABDOMINAL HYSTERECTOMY  Slaughters   Carotid Doppler  12/07/2012   R & L ICA - 0-49% diameter reduction   CORONARY ARTERY BYPASS GRAFT  09/1996   cath & CABG x5 LIMA-LAD, SVG-sequential OD & OM, SVG sequential to PDA & PLA (Dr. Prescott Gum)   REPLACEMENT TOTAL KNEE Bilateral 2001 & 2003   TONSILLECTOMY     TRANSTHORACIC ECHOCARDIOGRAM  08/23/2013   EF 50-55%, LV cavity size mod reduced, mild LVH, mild conc hypertrophy; mild AV stenosis & mild regurg; mild MR - ordered for murmur    Current Medications: Current Meds  Medication Sig   aspirin 81 MG tablet Take 81 mg by mouth daily.    atorvastatin (LIPITOR) 40 MG tablet TAKE 1 TABLET BY MOUTH EVERY DAY   augmented betamethasone dipropionate (DIPROLENE-AF) 0.05 % cream Apply topically 2 (two) times daily.   chloridazePOXIDE-amitriptyline (LIMBITROL) 5-12.5 MG per tablet Take 1 tablet by mouth daily.   cholecalciferol (VITAMIN D) 1000 UNITS tablet Take 2,000 Units by mouth daily.   estradiol (ESTRACE) 1 MG tablet Take 1 mg by mouth daily.   fluocinonide (LIDEX) 0.05 % external solution Apply 1 application topically 2 (two) times daily.     hydrochlorothiazide (MICROZIDE) 12.5 MG capsule Take 1 capsule (12.5 mg total) by mouth daily.   hydrocortisone 2.5 % cream hydrocortisone 2.5 % topical cream  APPLY TO AFFECTED AREA TWICE A DAY   Influenza vac split quadrivalent PF (FLUZONE HIGH-DOSE) 0.5 ML injection Fluzone High-Dose 2019-20 (PF) 180 mcg/0.5 mL intramuscular syringe  TO BE ADMINISTERED BY PHARMACIST FOR IMMUNIZATION   isosorbide mononitrate (IMDUR) 30 MG 24 hr tablet TAKE 1 TABLET BY MOUTH EVERY DAY   levothyroxine (SYNTHROID, LEVOTHROID) 75 MCG tablet Take 75 mcg by mouth daily before breakfast.   Multiple Vitamin (MULTIVITAMIN) capsule Take 1 capsule by mouth daily.   niacin 500 MG tablet Take 500 mg by mouth daily with breakfast.   olmesartan (BENICAR) 40 MG tablet Take 1 tablet (40 mg total) by mouth daily.   pantoprazole (PROTONIX) 40 MG tablet TAKE 1 TABLET BY MOUTH EVERY DAY (  Patient taking differently: Take by mouth as needed. )   TIMOLOL HEMIHYDRATE OP Place 1 drop into the right eye daily.   VITAMIN E PO Take 400 mg by mouth daily. Plus D   Current Facility-Administered Medications for the 10/02/20 encounter (Office Visit) with Darreld Mclean, PA-C  Medication   triamcinolone acetonide (KENALOG-40) injection 40 mg     Allergies:   Novocain [procaine]   Social History   Socioeconomic History   Marital status: Widowed    Spouse name: Not on file   Number of children: 4   Years of education: 34   Highest education level: Not on file  Occupational History   Not on file  Tobacco Use   Smoking status: Never Smoker   Smokeless tobacco: Never Used  Substance and Sexual Activity   Alcohol use: Yes    Comment: "a drink of wine every now and then"   Drug use: Not on file   Sexual activity: Not on file  Other Topics Concern   Not on file  Social History Narrative   Lives in [place] Strain:    Difficulty of Paying Living  Expenses: Not on file  Food Insecurity:    Worried About Charity fundraiser in the Last Year: Not on file   [employer] of Food in the Last Year: Not on file  Transportation Needs:    Lack of Transportation (Medical): Not on file   Lack of Transportation (Non-Medical): Not on file  Physical Activity:    Days of Exercise per Week: Not on file   Minutes of Exercise per Session: Not on file  Stress:    Feeling of Stress : Not on file  Social Connections:    Frequency of Communication with Friends and Family: Not on file   Frequency of Social Gatherings with Friends and Family: Not on file   Attends Religious Services: Not on file   Active Member of Clubs or Organizations: Not on file   Attends Archivist Meetings: Not on file   Marital Status: Not on file     Family History: The patient's family history includes Cancer in her sister; Diabetes in her sister; Heart Problems in her sister; Heart attack in her father and mother.  ROS:   Please see the history of present illness.     EKGs/Labs/Other Studies Reviewed:    The following studies were reviewed today:  Echocardiogram 03/04/2020: Impressions: 1. Mild aortic stenosis but moderate aortic regurgitation.  2. Left ventricular ejection fraction, by estimation, is 50 to 55%. The  left ventricle has low normal function. The left ventricle has no regional  wall motion abnormalities. Left ventricular diastolic parameters are  consistent with Grade II diastolic  dysfunction (pseudonormalization).  3. Right ventricular systolic function is normal. The right ventricular  size is normal. There is mildly elevated pulmonary artery systolic  pressure.  4. The mitral valve is normal in structure. Trivial mitral valve  regurgitation. No evidence of mitral stenosis.  5. The aortic valve is tricuspid. Aortic valve regurgitation is moderate.  Mild aortic valve stenosis.  6. The inferior vena cava is normal in size  with <50% respiratory  variability, suggesting right atrial pressure of 8 mmHg.  EKG:  EKG ordered today. EKG personally reviewed and demonstrates normal sinus rhythm, rate 80 bpm, with 1st degree AV block and LVH but no acute ischemic changes compared to prior tracings. Left axis deviation.  QTc 405 ms.  Recent Labs: 09/19/2020: BUN 34; Creatinine, Ser 1.23; Potassium 5.1; Sodium 137  Recent Lipid Panel    Component Value Date/Time   CHOL 151 03/03/2017 0842   TRIG 233 (H) 03/03/2017 0842   HDL 42 (L) 03/03/2017 0842   CHOLHDL 3.6 03/03/2017 0842   VLDL 47 (H) 03/03/2017 0842   LDLCALC 62 03/03/2017 0842    Physical Exam:    Vital Signs: BP (!) 136/58    Pulse 80    Ht 5' 4.5" (1.638 m)    Wt 187 lb (84.8 kg)    SpO2 95%    BMI 31.60 kg/m     Wt Readings from Last 3 Encounters:  10/02/20 187 lb (84.8 kg)  03/15/20 188 lb 9.6 oz (85.5 kg)  09/15/19 191 lb 12.8 oz (87 kg)     General: 86 y.o. female in no acute distress. HEENT: Normocephalic and atraumatic. Sclera clear. EOMs intact. Neck: Supple. No carotid bruits. No JVD. Heart: RRR. II-III systolic murmur. No gallops or rubs. Radial pulses 2+ and equal bilaterally. Lungs: No increased work of breathing. Clear to ausculation bilaterally. No wheezes, rhonchi, or rales.  Abdomen: Soft, non-distended, and non-tender to palpation.  MSK: Ambulates with a cane. Extremities: Mild 1+ lower extremity edema bilaterally.  Skin: Warm and dry. Neuro: Alert and oriented x3. No focal deficits. Psych: Normal affect. Responds appropriately.  Assessment:    1. Lower extremity edema   2. Coronary artery disease involving native coronary artery of native heart without angina pectoris   3. Aortic valve stenosis, etiology of cardiac valve disease unspecified   4. Aortic valve insufficiency, etiology of cardiac valve disease unspecified   5. Primary hypertension   6. Hyperlipidemia, unspecified hyperlipidemia type     Plan:    Lower  Extremity Swelling - Improved some with HCTZ but still present. No other signs of CHF.  - Continue HCTZ 12.5mg  daily. - Recommend compression stocking, elevating legs when possible, and limiting salt intake. - Advised patient to weight herself daily and notify us of 3lb weight gain in one day or 5lb weight gain in one week. - Creatinine was slightly elevated on 09/19/2020 after starting HCTZ so will recheck today to ensure this is stable.  CAD s/p CABG - s/p remote CABG in 1997. Last Myoview in 2013 showed no evidence of ischemia.  - EKG unchanged from prior. - No angina. - Continue Aspirin 81mg  daily, Toprol-XL 100mg  daily, Imdur 30mg  daily, and Lipitor 40mg  daily.   Mild Aortic Stenosis Moderate Aortic Insufficiency - Last Echo in 02/2020 showed LVEF of 50-55% with  mild AS and moderate AI. - Given age will defer addition serial imaging to Dr. [name].  Hypertension - BP well controlled. - Continue current medications: Olmesartan 40mg  daily, Toprol-XL 100mg  daily, Imdur 30mg  daily, and HCTZ 12.5mg  daily.  Hyperlipidemia - Lipid panel from 10/2018 (per [facility]): total Cholesterol 155, Triglycerides 206, HDL 43, LDL 71. - Continue Lipitor 40mg  daily.  - She is not fasting today so cannot repeat. Patient states she thinks her PCP follows this and she has an appointment with him next month.   Memory Concerns - Patient's biggest concern today is her memory. She notes changes in her memory over the last couple of years. She had an episode a couple of weeks ago where she could not think of her grandson's name while praying. This concerned here and she was wondering if she could of had a EF. This is possible but may also  just be due to gradual memory changes. No other stroke like symptoms. Advised patient to talk to PCP about this. If she has any active stroke symptoms, she knows that she needs to go to the ED.  Disposition: Follow up in 4 month with Dr. [name].   Medication Adjustments/Labs and  Tests Ordered: Current medicines are reviewed at length with the patient today.  Concerns regarding medicines are outlined above.  Orders Placed This Encounter  Procedures   Basic metabolic panel   EKG 81-EXNT   No orders of the defined types were placed in this encounter.   Patient Instructions  Medication Instructions:  - No medication changes today. *If you need a refill on your cardiac medications before your next appointment, please call your pharmacy*   Lab Work: - Will check BMET today.  If you have labs (blood work) drawn today and your tests are completely normal, you will receive your results only by:  [place] (if you have MyChart) OR  A paper copy in the mail If you have any lab test that is abnormal or we need to change your treatment, we will call you to review the results.   Testing/Procedures: N/A.   Follow-Up: At [facility], you and your health needs are our priority.  As part of our continuing mission to provide you with exceptional heart care, we have created designated Provider Care Teams.  These Care Teams include your primary Cardiologist (physician) and Advanced Practice Providers (APPs -  Physician Assistants and Nurse Practitioners) who all work together to provide you with the care you need, when you need it.  We recommend signing up for the patient portal called "MyChart".  Sign up information is provided on this After Visit Summary.  MyChart is used to connect with patients for Virtual Visits (Telemedicine).  Patients are able to view lab/test results, encounter notes, upcoming appointments, etc.  Non-urgent messages can be sent to your provider as well.   To learn more about what you can do with MyChart, go to [email or web address].    Your next appointment:   4 month(s)  The format for your next appointment:   In Person  Provider:   You may see Quay Burow, MD or one of the following Advanced Practice Providers on your  designated Care Team:    Kerin Ransom, PA-C  [place], [place]  Coletta Memos, [place]    Other Instructions: - Weigh yourself EVERY morning after you go to the bathroom but before you eat or drink anything. Write this number down in a weight log/diary. If you gain 3 pounds overnight or 5 pounds in a week, call the office. - Take your medicines as prescribed. If you have concerns about your medications, please call us before you stop taking them.  - Eat low salt foods--Limit salt (sodium) to 2000 mg per day. This will help prevent your body from holding onto fluid. Read food labels as many processed foods have a lot of sodium, especially canned goods and prepackaged meats. If you would like some assistance choosing low sodium foods, we would be happy to set you up with a nutritionist. - Stay as active as you can everyday. Staying active will give you more energy and make your muscles stronger. Start with 5 minutes at a time and work your way up to 30 minutes a day. Break up your activities--do some in the morning and some in the afternoon. Start with 3 days per week and work your way up to  5 days as you can.  If you have chest pain, feel short of breath, dizzy, or lightheaded, STOP. If you don't feel better after a short rest, call 911. If you do feel better, call the office to let us know you have symptoms with exercise. - Limit all fluids for the day to less than 2 liters. Fluid includes all drinks, coffee, juice, ice chips, soup, jello, and all other liquids.  Recommend compression stocking, limiting sodium intake, and elevating legs when possible to help with lower extremity swelling.       Signed, Darreld Mclean, PA-C  10/02/2020 10:25 AM     [facility]

## 2020-10-02 ENCOUNTER — Encounter: Payer: Self-pay | Admitting: Student

## 2020-10-02 ENCOUNTER — Encounter (INDEPENDENT_AMBULATORY_CARE_PROVIDER_SITE_OTHER): Payer: Self-pay

## 2020-10-02 ENCOUNTER — Ambulatory Visit (INDEPENDENT_AMBULATORY_CARE_PROVIDER_SITE_OTHER): Payer: Medicare Other | Admitting: Student

## 2020-10-02 ENCOUNTER — Other Ambulatory Visit: Payer: Self-pay

## 2020-10-02 VITALS — BP 136/58 | HR 80 | Ht 64.5 in | Wt 187.0 lb

## 2020-10-02 DIAGNOSIS — I10 Essential (primary) hypertension: Secondary | ICD-10-CM

## 2020-10-02 DIAGNOSIS — E785 Hyperlipidemia, unspecified: Secondary | ICD-10-CM | POA: Diagnosis not present

## 2020-10-02 DIAGNOSIS — I351 Nonrheumatic aortic (valve) insufficiency: Secondary | ICD-10-CM | POA: Diagnosis not present

## 2020-10-02 DIAGNOSIS — I350 Nonrheumatic aortic (valve) stenosis: Secondary | ICD-10-CM

## 2020-10-02 DIAGNOSIS — Z79899 Other long term (current) drug therapy: Secondary | ICD-10-CM | POA: Diagnosis not present

## 2020-10-02 DIAGNOSIS — I2510 Atherosclerotic heart disease of native coronary artery without angina pectoris: Secondary | ICD-10-CM | POA: Diagnosis not present

## 2020-10-02 DIAGNOSIS — R413 Other amnesia: Secondary | ICD-10-CM

## 2020-10-02 DIAGNOSIS — R600 Localized edema: Secondary | ICD-10-CM | POA: Diagnosis not present

## 2020-10-02 LAB — BASIC METABOLIC PANEL
BUN/Creatinine Ratio: 23 (ref 12–28)
BUN: 29 mg/dL (ref 10–36)
CO2: 22 mmol/L (ref 20–29)
Calcium: 9.2 mg/dL (ref 8.7–10.3)
Chloride: 101 mmol/L (ref 96–106)
Creatinine, Ser: 1.24 mg/dL — ABNORMAL HIGH (ref 0.57–1.00)
GFR calc Af Amer: 44 mL/min/{1.73_m2} — ABNORMAL LOW (ref >59)
GFR calc non Af Amer: 38 mL/min/{1.73_m2} — ABNORMAL LOW (ref >59)
Glucose: 114 mg/dL — ABNORMAL HIGH (ref 65–99)
Potassium: 4.6 mmol/L (ref 3.5–5.2)
Sodium: 137 mmol/L (ref 134–144)

## 2020-10-02 NOTE — Patient Instructions (Signed)
Medication Instructions:  - No medication changes today. *If you need a refill on your cardiac medications before your next appointment, please call your pharmacy*   Lab Work: - Will check BMET today.  If you have labs (blood work) drawn today and your tests are completely normal, you will receive your results only by: [place] MyChart Message (if you have MyChart) OR . A paper copy in the mail If you have any lab test that is abnormal or we need to change your treatment, we will call you to review the results.   Testing/Procedures: N/A.   Follow-Up: At [facility], you and your health needs are our priority.  As part of our continuing mission to provide you with exceptional heart care, we have created designated Provider Care Teams.  These Care Teams include your primary Cardiologist (physician) and Advanced Practice Providers (APPs -  Physician Assistants and Nurse Practitioners) who all work together to provide you with the care you need, when you need it.  We recommend signing up for the patient portal called "MyChart".  Sign up information is provided on this After Visit Summary.  MyChart is used to connect with patients for Virtual Visits (Telemedicine).  Patients are able to view lab/test results, encounter notes, upcoming appointments, etc.  Non-urgent messages can be sent to your provider as well.   To learn more about what you can do with MyChart, go to [email or web address].    Your next appointment:   4 month(s)  The format for your next appointment:   In Person  Provider:   You may see Quay Burow, MD or one of the following Advanced Practice Providers on your designated Care Team:    Kerin Ransom, PA-C  [place], [place]  Coletta Memos, [place]    Other Instructions: - Weigh yourself EVERY morning after you go to the bathroom but before you eat or drink anything. Write this number down in a weight log/diary. If you gain 3 pounds overnight or 5 pounds in a week,  call the office. - Take your medicines as prescribed. If you have concerns about your medications, please call us before you stop taking them.  - Eat low salt foods--Limit salt (sodium) to 2000 mg per day. This will help prevent your body from holding onto fluid. Read food labels as many processed foods have a lot of sodium, especially canned goods and prepackaged meats. If you would like some assistance choosing low sodium foods, we would be happy to set you up with a nutritionist. - Stay as active as you can everyday. Staying active will give you more energy and make your muscles stronger. Start with 5 minutes at a time and work your way up to 30 minutes a day. Break up your activities--do some in the morning and some in the afternoon. Start with 3 days per week and work your way up to 5 days as you can.  If you have chest pain, feel short of breath, dizzy, or lightheaded, STOP. If you don't feel better after a short rest, call 911. If you do feel better, call the office to let us know you have symptoms with exercise. - Limit all fluids for the day to less than 2 liters. Fluid includes all drinks, coffee, juice, ice chips, soup, jello, and all other liquids.  Recommend compression stocking, limiting sodium intake, and elevating legs when possible to help with lower extremity swelling.

## 2020-10-09 DIAGNOSIS — Z23 Encounter for immunization: Secondary | ICD-10-CM | POA: Diagnosis not present

## 2020-10-15 ENCOUNTER — Telehealth: Payer: Self-pay | Admitting: Dermatology

## 2020-10-15 NOTE — Telephone Encounter (Signed)
Wants to speak to nurse about Thursday appt.; said she will be home all day today.

## 2020-10-17 ENCOUNTER — Ambulatory Visit: Payer: Medicare Other | Admitting: Dermatology

## 2020-10-17 ENCOUNTER — Other Ambulatory Visit: Payer: Self-pay

## 2020-10-17 ENCOUNTER — Encounter: Payer: Self-pay | Admitting: Dermatology

## 2020-10-17 ENCOUNTER — Ambulatory Visit (INDEPENDENT_AMBULATORY_CARE_PROVIDER_SITE_OTHER): Payer: Medicare Other | Admitting: Dermatology

## 2020-10-17 DIAGNOSIS — C44719 Basal cell carcinoma of skin of left lower limb, including hip: Secondary | ICD-10-CM | POA: Diagnosis not present

## 2020-10-17 DIAGNOSIS — D485 Neoplasm of uncertain behavior of skin: Secondary | ICD-10-CM

## 2020-10-17 MED ORDER — MUPIROCIN 2 % EX OINT: 1.0000 "application " | TOPICAL_OINTMENT | Freq: Two times a day (BID) | CUTANEOUS | 0 refills | Status: DC

## 2020-10-17 NOTE — Progress Notes (Signed)
cauret and cautery 2.0 cm

## 2020-10-17 NOTE — Patient Instructions (Signed)

## 2020-10-24 ENCOUNTER — Telehealth: Payer: Self-pay | Admitting: Dermatology

## 2020-10-24 NOTE — Telephone Encounter (Signed)
Said ST told her to call back in a week, but she's not sure if it was to get a result or just to let us know how she's doing. (She's doing fine)

## 2020-10-25 NOTE — Telephone Encounter (Signed)
Path to patient. Dr.Tafeen feels patient should go to mohs for treatment but patient is going to talk with her children and decide what she wants to do. She said spot does not bother her at all. She will be back in touch with our office on her decision.

## 2020-10-25 NOTE — Telephone Encounter (Signed)
-----   Message from Lavonna Monarch, MD sent at 10/25/2020  9:40 AM EDT ----- Please let Mrs. Kathryn Carlson know that I understand her age and mobility are limiting factors, but the only way to treat this skin cancer with the highest cure and lowest risk would be Mohs surgery.

## 2020-10-27 ENCOUNTER — Other Ambulatory Visit: Payer: Self-pay | Admitting: Medical

## 2020-11-05 DIAGNOSIS — Z23 Encounter for immunization: Secondary | ICD-10-CM | POA: Diagnosis not present

## 2020-11-15 ENCOUNTER — Other Ambulatory Visit: Payer: Self-pay | Admitting: Dermatology

## 2020-11-20 DIAGNOSIS — E785 Hyperlipidemia, unspecified: Secondary | ICD-10-CM | POA: Diagnosis not present

## 2020-11-20 DIAGNOSIS — E039 Hypothyroidism, unspecified: Secondary | ICD-10-CM | POA: Diagnosis not present

## 2020-11-25 DIAGNOSIS — I350 Nonrheumatic aortic (valve) stenosis: Secondary | ICD-10-CM | POA: Diagnosis not present

## 2020-11-25 DIAGNOSIS — I10 Essential (primary) hypertension: Secondary | ICD-10-CM | POA: Diagnosis not present

## 2020-11-25 DIAGNOSIS — E039 Hypothyroidism, unspecified: Secondary | ICD-10-CM | POA: Diagnosis not present

## 2020-11-25 DIAGNOSIS — D638 Anemia in other chronic diseases classified elsewhere: Secondary | ICD-10-CM | POA: Diagnosis not present

## 2020-11-25 DIAGNOSIS — N1831 Chronic kidney disease, stage 3a: Secondary | ICD-10-CM | POA: Diagnosis not present

## 2020-11-25 DIAGNOSIS — E785 Hyperlipidemia, unspecified: Secondary | ICD-10-CM | POA: Diagnosis not present

## 2020-11-25 DIAGNOSIS — Z0000 Encounter for general adult medical examination without abnormal findings: Secondary | ICD-10-CM | POA: Diagnosis not present

## 2020-11-25 DIAGNOSIS — R82998 Other abnormal findings in urine: Secondary | ICD-10-CM | POA: Diagnosis not present

## 2020-11-25 DIAGNOSIS — I129 Hypertensive chronic kidney disease with stage 1 through stage 4 chronic kidney disease, or unspecified chronic kidney disease: Secondary | ICD-10-CM | POA: Diagnosis not present

## 2020-11-25 DIAGNOSIS — R531 Weakness: Secondary | ICD-10-CM | POA: Diagnosis not present

## 2020-12-26 ENCOUNTER — Telehealth: Payer: Self-pay | Admitting: Physician Assistant

## 2020-12-26 NOTE — Telephone Encounter (Signed)
Patient has some irritation in groin area and a few spots to be checked so she wanted to make an appointment with kelli. Appointment made.

## 2020-12-26 NOTE — Telephone Encounter (Signed)
Has an "odd problem" she won't talk to me about; wants to talk to a nurse.

## 2021-01-23 DIAGNOSIS — R928 Other abnormal and inconclusive findings on diagnostic imaging of breast: Secondary | ICD-10-CM | POA: Diagnosis not present

## 2021-01-23 DIAGNOSIS — R634 Abnormal weight loss: Secondary | ICD-10-CM | POA: Diagnosis not present

## 2021-01-23 DIAGNOSIS — G479 Sleep disorder, unspecified: Secondary | ICD-10-CM | POA: Diagnosis not present

## 2021-01-23 DIAGNOSIS — E039 Hypothyroidism, unspecified: Secondary | ICD-10-CM | POA: Diagnosis not present

## 2021-01-23 DIAGNOSIS — R630 Anorexia: Secondary | ICD-10-CM | POA: Diagnosis not present

## 2021-01-23 DIAGNOSIS — I10 Essential (primary) hypertension: Secondary | ICD-10-CM | POA: Diagnosis not present

## 2021-01-23 DIAGNOSIS — K219 Gastro-esophageal reflux disease without esophagitis: Secondary | ICD-10-CM | POA: Diagnosis not present

## 2021-01-27 NOTE — Progress Notes (Unsigned)
Cardiology Clinic Note   Patient Name: Kathryn Carlson Date of Encounter: 01/28/2021  Primary Care Provider:  Burnard Bunting, MD Primary Cardiologist:  Quay Burow, MD  Patient Profile    Kathryn Carlson 87 year old female presents the clinic today for follow-up evaluation of her hypertension and PVCs.  Past Medical History    Past Medical History:  Diagnosis Date  . BCC (basal cell carcinoma of skin) 01/09/2009   Lower Central Back (tx p bx)  . CAD (coronary artery disease)    possible ant wall MI ('97) - cath & CABG x5  . Exogenous obesity   . GERD (gastroesophageal reflux disease)   . History of nuclear stress test 09/2012   lexiscan; mild perfusion defect in apical anterior & apical region (infarct/scar) - no significant ischemia, low risk   . History of total bilateral knee replacement   . Hypertension   . Hypothyroidism   . Nodular basal cell carcinoma (BCC) 08/17/2017   Left Calf (tx p bx)  . PVC's (premature ventricular contractions)   . SCC (squamous cell carcinoma) [place] 01/29/2004   Right Forearm (Cx3,5FU)  . SCCA (squamous cell carcinoma) of skin 03/13/2005   Mid Upper Back  . SCCA (squamous cell carcinoma) of skin 09/07/2005   Left Elbow(Cx3,5FU)  . SCCA (squamous cell carcinoma) of skin 01/25/2006   Left Brow(in situ) (Cx3,5FU)  . SCCA (squamous cell carcinoma) of skin 04/27/2006   Inner Left Shoulder(Keratoacanthoma) (Exc.)  . SCCA (squamous cell carcinoma) of skin 03/22/2007   Left Temple(in situ) and Bridge of Nose(in situ) (Cs3,5FU)  . SCCA (squamous cell carcinoma) of skin 05/12/2007   Top of Scalp(Cx3,5FU)  . SCCA (squamous cell carcinoma) of skin 01/28/2011   Right Upper Arm(in situ)  . SCCA (squamous cell carcinoma) of skin 07/26/2012   Glabella, Inferior Tip (Cx3,5FU)  . SCCA (squamous cell carcinoma) of skin 03/08/2013   Left Front Scalp(in situ) and Upper Nose(in situ) (Cx3,5FU)  . SCCA (squamous cell carcinoma) of skin 07/24/2013    Right Lower Leg(Keratoacanthoma)  . SCCA (squamous cell carcinoma) of skin 09/11/2015   Left Scalp(in situ) (Cx3,5FU)  . SCCA (squamous cell carcinoma) of skin 01/15/2016   Left Forearm(in situ) and Left Temple(in situ) (tx p bx)  . SCCA (squamous cell carcinoma) of skin 10/01/2016   Left Mid Forearm(in situ)(tx p bx) and Left Front Scalp(watch)  . SCCA (squamous cell carcinoma) of skin 11/12/2016   Left Temple(Keratoacanthoma) (watch)  . SCCA (squamous cell carcinoma) of skin 02/11/2017   Top Left Hand(Keratoacanthoma) (tx p bx)  . Squamous cell carcinoma in situ (SCCIS) 11/18/1999   Above Left Outer Eyebrow  . Squamous cell carcinoma of scalp    removed - Dr. Denna Haggard  . Superficial basal cell carcinoma (BCC) 07/14/2004   Left Scapula (Cx3,5FU)   Past Surgical History:  Procedure Laterality Date  . ABDOMINAL HYSTERECTOMY  1984  . APPENDECTOMY  1941  . Carotid Doppler  12/07/2012   R & L ICA - 0-49% diameter reduction  . CORONARY ARTERY BYPASS GRAFT  09/1996   cath & CABG x5 LIMA-LAD, SVG-sequential OD & OM, SVG sequential to PDA & PLA (Dr. Prescott Gum)  . REPLACEMENT TOTAL KNEE Bilateral 2001 & 2003  . TONSILLECTOMY    . TRANSTHORACIC ECHOCARDIOGRAM  08/23/2013   EF 50-55%, LV cavity size mod reduced, mild LVH, mild conc hypertrophy; mild AV stenosis & mild regurg; mild MR - ordered for murmur    Allergies  Allergies  Allergen Reactions  .  Novocain [Procaine]     History of Present Illness    Kathryn Carlson has a PMH of hypertension, NSVT, frequent PVCs, aortic stenosis, coronary artery disease status post CABG x 3 1997, dyslipidemia, symptomatic bradycardia, HLD, and lower extremity edema. Echocardiogram 03/04/2020 showed mild aortic stenosis, moderate aortic regurgitation, 50-55% LVEF, and G2 DD.  She is a former patient of Dr. Rollene Fare and Dr. Debara Pickett. She is now followed by Dr. [name]. She had an MI at age 67. Underwent nuclear stress test 2013 which showed no ischemia. She  was last seen by Dr. [name] on 3/21 during that time she was doing well.  She contacted the nurse triage line on 09/03/20 with concerns about swelling in her lower extremities. She was started on HCTZ 12.5 mg daily. She was seen in this clinic by Sande Rives, PA-C on 10/02/2020. During that time she was noted to have continued lower extremity edema. Her creatinine was stable. She did have some increased dyspnea with exertion that was felt to be related to deconditioning. She denied orthopnea or PND. Her weight continues to be stable from a previous visit. She denied palpitations, lightheadedness, dizziness. She did report some numbness and bilateral feet which she attributed to neuropathy. Her main complaint during the visit was related to her memory loss. She reported that this was not a new occurrence and she had noted increased forgetfulness over the last several years. She was instructed to follow-up with her PCP.  She presents to the clinic today for follow-up evaluation states she is having trouble sleeping and has a poor appetite.  She has been trying to find low-dose melatonin that was suggested by her PCP.  However, she is only been able to find 10 mg dosing at the store.  I instructed her to look in the children section.  She has been supplementing her diet with boost supplement drinks.  She states that she has lost around 16 pounds.  She reports that she will have cravings for food however when the food arrived she does not have any desire to eat.  She remains physically active walking at friend's home daily.  She notices brief occasional episodes of what she describes as "flutters".  She reports these last for only seconds and then dissipate on their own.  She reports that she had labs drawn recently at Dr. Jacquiline Doe office, we will request these labs.  I recommend that she increase her physical activity as tolerated, continue her heart healthy diet, and follow-up in 6 months.  Today she denies  chest pain, shortness of breath, lower extremity edema, fatigue, palpitations, melena, hematuria, hemoptysis, diaphoresis, weakness, presyncope, syncope, orthopnea, and PND.   Home Medications    Prior to Admission medications   Medication Sig Start Date End Date Taking? Authorizing Provider  aspirin 81 MG tablet Take 81 mg by mouth daily.     [provider]  atorvastatin (LIPITOR) 40 MG tablet TAKE 1 TABLET BY MOUTH EVERY DAY 07/29/20   Lorretta Harp, MD  augmented betamethasone dipropionate (DIPROLENE-AF) 0.05 % cream Apply topically 2 (two) times daily.    [provider]  chloridazePOXIDE-amitriptyline (LIMBITROL) 5-12.5 MG per tablet Take 1 tablet by mouth daily.    [provider]  cholecalciferol (VITAMIN D) 1000 UNITS tablet Take 2,000 Units by mouth daily.    [provider]  estradiol (ESTRACE) 1 MG tablet Take 1 mg by mouth daily.    [provider]  fluocinonide (LIDEX) 0.05 % external solution Apply 1  application topically 2 (two) times daily.     [provider]  hydrochlorothiazide (MICROZIDE) 12.5 MG capsule Take 1 capsule (12.5 mg total) by mouth daily. 09/04/20 12/03/20  Lorretta Harp, MD  hydrocortisone 2.5 % cream APPLY TO RASH/ITCHY SKIN AT BEDTIME 11/18/20   Lavonna Monarch, MD  Influenza vac split quadrivalent PF (FLUZONE HIGH-DOSE) 0.5 ML injection Fluzone High-Dose 2019-20 (PF) 180 mcg/0.5 mL intramuscular syringe  TO BE ADMINISTERED BY PHARMACIST FOR IMMUNIZATION    [provider]  isosorbide mononitrate (IMDUR) 30 MG 24 hr tablet TAKE 1 TABLET BY MOUTH EVERY DAY 03/12/20   Lorretta Harp, MD  levothyroxine (SYNTHROID, LEVOTHROID) 75 MCG tablet Take 75 mcg by mouth daily before breakfast.    [provider]  metoprolol succinate (TOPROL-XL) 100 MG 24 hr tablet TAKE 1 TABLET (100 MG TOTAL) BY MOUTH DAILY. TAKE WITH OR IMMEDIATELY FOLLOWING A MEAL. 10/28/20 01/26/21  Lorretta Harp, MD   Multiple Vitamin (MULTIVITAMIN) capsule Take 1 capsule by mouth daily.    [provider]  mupirocin ointment (BACTROBAN) 2 % Apply 1 application topically 2 (two) times daily. 10/17/20   Lavonna Monarch, MD  niacin 500 MG tablet Take 500 mg by mouth daily with breakfast.    [provider]  olmesartan (BENICAR) 40 MG tablet Take 1 tablet (40 mg total) by mouth daily. 04/08/20   Lorretta Harp, MD  pantoprazole (PROTONIX) 40 MG tablet TAKE 1 TABLET BY MOUTH EVERY DAY Patient taking differently: Take by mouth as needed.  12/05/13   Hilty, Nadean Corwin, MD  timolol (TIMOPTIC) 0.5 % ophthalmic solution Place 1 drop into the right eye every morning. 08/20/20   [provider]  TIMOLOL HEMIHYDRATE OP Place 1 drop into the right eye daily.    [provider]  VITAMIN E PO Take 400 mg by mouth daily. Plus D    [provider]    Family History    Family History  Problem Relation Age of Onset  . Heart attack Mother   . Heart attack Father   . Cancer Sister   . Diabetes Sister   . Heart Problems Sister    She indicated that her mother is deceased. She indicated that her father is deceased. She indicated that her maternal grandmother is deceased. She indicated that her maternal grandfather is deceased. She indicated that her paternal grandmother is deceased. She indicated that her paternal grandfather is deceased.  Social History    Social History   Socioeconomic History  . Marital status: Widowed    Spouse name: Not on file  . Number of children: 4  . Years of education: 80  . Highest education level: Not on file  Occupational History  . Not on file  Tobacco Use  . Smoking status: Never Smoker  . Smokeless tobacco: Never Used  Substance and Sexual Activity  . Alcohol use: Yes    Comment: "a drink of wine every now and then"  . Drug use: Not on file  . Sexual activity: Not on file  Other Topics Concern  . Not on file  Social History  Narrative   Lives in [place] Strain: Not on file  Food Insecurity: Not on file  Transportation Needs: Not on file  Physical Activity: Not on file  Stress: Not on file  Social Connections: Not on file  Intimate Partner Violence: Not on file     Review of  Systems    General:  No chills, fever, night sweats or weight changes.  Cardiovascular:  No chest pain, dyspnea on exertion, edema, orthopnea, palpitations, paroxysmal nocturnal dyspnea. Dermatological: No rash, lesions/masses Respiratory: No cough, dyspnea Urologic: No hematuria, dysuria Abdominal:   No nausea, vomiting, diarrhea, bright red blood per rectum, melena, or hematemesis Neurologic:  No visual changes, wkns, changes in mental status. All other systems reviewed and are otherwise negative except as noted above.  Physical Exam    VS:  BP (!) 122/56   Pulse 86   Ht 5\' 1"  (1.549 m)   Wt 177 lb (80.3 kg)   SpO2 93%   BMI 33.44 kg/m  , BMI Body mass index is 33.44 kg/m. GEN: Well nourished, well developed, in no acute distress. HEENT: normal. Neck: Supple, no JVD, carotid bruits, or masses. Cardiac: RRR, no murmurs, rubs, or gallops. No clubbing, cyanosis, edema.  Radials/DP/PT 2+ and equal bilaterally.  Respiratory:  Respirations regular and unlabored, clear to auscultation bilaterally. GI: Soft, nontender, nondistended, BS + x 4. MS: no deformity or atrophy. Skin: warm and dry, no rash. Neuro:  Strength and sensation are intact. Psych: Normal affect.  Accessory Clinical Findings    Recent Labs: 10/02/2020: BUN 29; Creatinine, Ser 1.24; Potassium 4.6; Sodium 137   Recent Lipid Panel    Component Value Date/Time   CHOL 151 03/03/2017 0842   TRIG 233 (H) 03/03/2017 0842   HDL 42 (L) 03/03/2017 0842   CHOLHDL 3.6 03/03/2017 0842   VLDL 47 (H) 03/03/2017 0842   LDLCALC 62 03/03/2017 0842    ECG personally reviewed by me today-none  today.  Echocardiogram 03/04/2020 IMPRESSIONS    1. Mild aortic stenosis but moderate aortic regurgitation.  2. Left ventricular ejection fraction, by estimation, is 50 to 55%. The  left ventricle has low normal function. The left ventricle has no regional  wall motion abnormalities. Left ventricular diastolic parameters are  consistent with Grade II diastolic  dysfunction (pseudonormalization).  3. Right ventricular systolic function is normal. The right ventricular  size is normal. There is mildly elevated pulmonary artery systolic  pressure.  4. The mitral valve is normal in structure. Trivial mitral valve  regurgitation. No evidence of mitral stenosis.  5. The aortic valve is tricuspid. Aortic valve regurgitation is moderate.  Mild aortic valve stenosis.  6. The inferior vena cava is normal in size with <50% respiratory  variability, suggesting right atrial pressure of 8 mmHg.   Assessment & Plan   1. Lower extremity edema-euvolemic today. Denies recent episodes of increased lower extremity edema. Continue HCTZ Heart healthy low-sodium diet-salty 6 given Increase physical activity as tolerated Heart healthy low-sodium diet-salty 6 given Daily weights-contact office with a weight increase of 3 pounds overnight or 5 pounds in 1 week Request labs from PCP  Coronary artery disease-no chest pain today. Status post CABG 1997. Nuclear stress test 2013 showed no ischemia.  Heart rate today 86 bpm. Continue aspirin, metoprolol, Imdur, Lipitor Heart healthy low-sodium diet-salty 6 given Increase physical activity as tolerated   Essential hypertension-BP today 122/56. Well-controlled at home. Continue metoprolol, olmesartan, Imdur, HCTZ Heart healthy low-sodium diet-salty 6 given Increase physical activity as tolerated  Hyperlipidemia-LDL 71 on 11/19. Continue atorvastatin Heart healthy low-sodium high-fiber diet Increase physical activity as tolerated  Mild aortic  stenosis/moderate aortic insufficiency-no increased DOE or activity intolerance. Echocardiogram 3/21 showed LVEF 50-55%, mild AS with moderate AI. Repeat when clinically indicated  Disposition: Follow-up with Dr. [name] or me in 6  months.  Jossie Ng. Rahsaan Weakland NP-C    01/28/2021, 12:10 PM [facility] [address] Office [phone] Fax [phone]  Notice: This dictation was prepared with Dragon dictation along with smaller phrase technology. Any transcriptional errors that result from this process are unintentional and may not be corrected upon review.  I spent 15 minutes examining this patient, reviewing medications, and using patient centered shared decision making involving her cardiac care.  Prior to her visit I spent greater than 20 minutes reviewing her past medical history,  medications, and prior cardiac tests.

## 2021-01-28 ENCOUNTER — Ambulatory Visit (INDEPENDENT_AMBULATORY_CARE_PROVIDER_SITE_OTHER): Payer: Medicare Other | Admitting: General Practice

## 2021-01-28 ENCOUNTER — Encounter: Payer: Self-pay | Admitting: General Practice

## 2021-01-28 ENCOUNTER — Other Ambulatory Visit: Payer: Self-pay

## 2021-01-28 VITALS — BP 122/56 | HR 86 | Ht 61.0 in | Wt 177.0 lb

## 2021-01-28 DIAGNOSIS — I2510 Atherosclerotic heart disease of native coronary artery without angina pectoris: Secondary | ICD-10-CM | POA: Diagnosis not present

## 2021-01-28 DIAGNOSIS — I351 Nonrheumatic aortic (valve) insufficiency: Secondary | ICD-10-CM | POA: Diagnosis not present

## 2021-01-28 DIAGNOSIS — R600 Localized edema: Secondary | ICD-10-CM | POA: Diagnosis not present

## 2021-01-28 DIAGNOSIS — E785 Hyperlipidemia, unspecified: Secondary | ICD-10-CM

## 2021-01-28 DIAGNOSIS — I350 Nonrheumatic aortic (valve) stenosis: Secondary | ICD-10-CM

## 2021-01-28 DIAGNOSIS — I10 Essential (primary) hypertension: Secondary | ICD-10-CM | POA: Diagnosis not present

## 2021-01-28 NOTE — Patient Instructions (Signed)
Medication Instructions:  The current medical regimen is effective;  continue present plan and medications as directed. Please refer to the Current Medication list given to you today.  *If you need a refill on your cardiac medications before your next appointment, please call your pharmacy*  Lab Work:   Testing/Procedures:  NONE    NONE  Special Instructions LOOK IN THE CHILDRENS OTC MEDICATIONS FOR MELATONIN  PLEASE MAINTAIN PHYSICAL ACTIVITY AS TOLERATED  Follow-Up: Your next appointment:  6 month(s) In Person with Quay Burow, MD OR IF UNAVAILABLE JESSE CLEAVER, FNP-C   Please call our office 2 months in advance to schedule this appointment   At [facility], you and your health needs are our priority.  As part of our continuing mission to provide you with exceptional heart care, we have created designated Provider Care Teams.  These Care Teams include your primary Cardiologist (physician) and Advanced Practice Providers (APPs -  Physician Assistants and Nurse Practitioners) who all work together to provide you with the care you need, when you need it.  We recommend signing up for the patient portal called "MyChart".  Sign up information is provided on this After Visit Summary.  MyChart is used to connect with patients for Virtual Visits (Telemedicine).  Patients are able to view lab/test results, encounter notes, upcoming appointments, etc.  Non-urgent messages can be sent to your provider as well.   To learn more about what you can do with MyChart, go to [email or web address].

## 2021-02-03 ENCOUNTER — Other Ambulatory Visit: Payer: Self-pay | Admitting: Cardiovascular Disease

## 2021-02-03 DIAGNOSIS — I10 Essential (primary) hypertension: Secondary | ICD-10-CM

## 2021-02-04 DIAGNOSIS — M7542 Impingement syndrome of left shoulder: Secondary | ICD-10-CM | POA: Diagnosis not present

## 2021-02-05 ENCOUNTER — Ambulatory Visit: Payer: Medicare Other | Admitting: Physician Assistant

## 2021-02-20 DIAGNOSIS — R531 Weakness: Secondary | ICD-10-CM | POA: Diagnosis not present

## 2021-02-20 DIAGNOSIS — E875 Hyperkalemia: Secondary | ICD-10-CM | POA: Diagnosis not present

## 2021-02-20 DIAGNOSIS — G479 Sleep disorder, unspecified: Secondary | ICD-10-CM | POA: Diagnosis not present

## 2021-02-20 DIAGNOSIS — R634 Abnormal weight loss: Secondary | ICD-10-CM | POA: Diagnosis not present

## 2021-02-20 DIAGNOSIS — R630 Anorexia: Secondary | ICD-10-CM | POA: Diagnosis not present

## 2021-02-20 DIAGNOSIS — I10 Essential (primary) hypertension: Secondary | ICD-10-CM | POA: Diagnosis not present

## 2021-02-20 DIAGNOSIS — K5909 Other constipation: Secondary | ICD-10-CM | POA: Diagnosis not present

## 2021-02-20 DIAGNOSIS — K219 Gastro-esophageal reflux disease without esophagitis: Secondary | ICD-10-CM | POA: Diagnosis not present

## 2021-02-24 ENCOUNTER — Other Ambulatory Visit: Payer: Self-pay | Admitting: Dermatology

## 2021-02-24 ENCOUNTER — Telehealth: Payer: Self-pay | Admitting: Dermatology

## 2021-02-24 NOTE — Telephone Encounter (Signed)
Informed patient that we need name of medication in order to send in refills- informed patient to call pharmacy and send over refill request.

## 2021-02-24 NOTE — Telephone Encounter (Signed)
Patient is calling to get the name of the scalp medication that Lavonna Monarch, MD prescribed for her previously.  Patient states that she may need this medication called in if she cannot find it at home.  Patient uses [facility] in [facility] in [place], [place].

## 2021-03-05 ENCOUNTER — Other Ambulatory Visit: Payer: Self-pay | Admitting: Cardiovascular Disease

## 2021-03-14 DIAGNOSIS — R634 Abnormal weight loss: Secondary | ICD-10-CM | POA: Diagnosis not present

## 2021-03-14 DIAGNOSIS — M6281 Muscle weakness (generalized): Secondary | ICD-10-CM | POA: Diagnosis not present

## 2021-03-14 DIAGNOSIS — K219 Gastro-esophageal reflux disease without esophagitis: Secondary | ICD-10-CM | POA: Diagnosis not present

## 2021-03-14 DIAGNOSIS — K5909 Other constipation: Secondary | ICD-10-CM | POA: Diagnosis not present

## 2021-03-14 DIAGNOSIS — G479 Sleep disorder, unspecified: Secondary | ICD-10-CM | POA: Diagnosis not present

## 2021-03-14 DIAGNOSIS — R630 Anorexia: Secondary | ICD-10-CM | POA: Diagnosis not present

## 2021-03-14 DIAGNOSIS — N1831 Chronic kidney disease, stage 3a: Secondary | ICD-10-CM | POA: Diagnosis not present

## 2021-03-14 DIAGNOSIS — I129 Hypertensive chronic kidney disease with stage 1 through stage 4 chronic kidney disease, or unspecified chronic kidney disease: Secondary | ICD-10-CM | POA: Diagnosis not present

## 2021-03-17 ENCOUNTER — Ambulatory Visit: Payer: 59 | Admitting: Dermatology

## 2021-04-23 ENCOUNTER — Ambulatory Visit (INDEPENDENT_AMBULATORY_CARE_PROVIDER_SITE_OTHER): Payer: Medicare Other | Admitting: Dermatology

## 2021-04-23 ENCOUNTER — Other Ambulatory Visit: Payer: Self-pay

## 2021-04-23 DIAGNOSIS — D049 Carcinoma in situ of skin, unspecified: Secondary | ICD-10-CM

## 2021-04-23 DIAGNOSIS — D0439 Carcinoma in situ of skin of other parts of face: Secondary | ICD-10-CM | POA: Diagnosis not present

## 2021-04-28 ENCOUNTER — Other Ambulatory Visit: Payer: Self-pay | Admitting: Internal Medicine

## 2021-04-28 DIAGNOSIS — R630 Anorexia: Secondary | ICD-10-CM

## 2021-04-28 DIAGNOSIS — R634 Abnormal weight loss: Secondary | ICD-10-CM

## 2021-04-29 DIAGNOSIS — H4031X2 Glaucoma secondary to eye trauma, right eye, moderate stage: Secondary | ICD-10-CM | POA: Diagnosis not present

## 2021-04-29 DIAGNOSIS — Z961 Presence of intraocular lens: Secondary | ICD-10-CM | POA: Diagnosis not present

## 2021-04-29 DIAGNOSIS — H5211 Myopia, right eye: Secondary | ICD-10-CM | POA: Diagnosis not present

## 2021-04-29 DIAGNOSIS — H5202 Hypermetropia, left eye: Secondary | ICD-10-CM | POA: Diagnosis not present

## 2021-05-01 ENCOUNTER — Inpatient Hospital Stay: Admission: RE | Admit: 2021-05-01 | Payer: Medicare Other | Source: Ambulatory Visit

## 2021-05-04 ENCOUNTER — Encounter: Payer: Self-pay | Admitting: Dermatology

## 2021-05-04 NOTE — Progress Notes (Signed)
   Follow-Up Visit   Subjective  Kathryn Carlson is a 87 y.o. female who presents for the following: Skin Problem (Patient is concerned with her scalp. Its very itchy. Some crusty lesions on scalp too.).  Persistent crust left frontal scalp with at times severe itching Location:  Duration:  Quality:  Associated Signs/Symptoms: Modifying Factors:  Severity:  Timing: Context:   Objective  Well appearing patient in no apparent distress; mood and affect are within normal limits. Objective  Left Front scalp: Kathryn Carlson has a multifocal biopsy proven but not very thick carcinoma in situ on left frontal scalp.  Her chief complaint is the persistent itching which has not been relieved with any topical therapy.  We discussed multiple treatment options including benign neglect.  We elected medium depth freeze with some hope of cure but mainly to see if we can reduce her disturbing symptoms.    A focused examination was performed including Head and neck.. Relevant physical exam findings are noted in the Assessment and Plan.   Assessment & Plan    Squamous cell carcinoma in situ (SCCIS) of skin Left Front scalp  Destruction of lesion Complexity: simple   Destruction method: cryotherapy   Informed consent: discussed and consent obtained   Timeout:  patient name, date of birth, surgical site, and procedure verified Lesion destroyed using liquid nitrogen: Yes   Cryotherapy cycles:  3 Lesion length (cm):  2.5 Lesion width (cm):  2.5 Margin per side (cm):  0 Final wound size (cm):  2.5 Outcome: patient tolerated procedure well with no complications   Post-procedure details: wound care instructions given    We chose not to repeat biopsy unless clinically obvious residual carcinoma in situ.  Deep cryosurgery done on three 1cm adjacent crusts.  Cherry treated with 18-22nd broad LN2 spray.      I, Lavonna Monarch, MD, have reviewed all documentation for this visit.  The  documentation on 05/04/21 for the exam, diagnosis, procedures, and orders are all accurate and complete.

## 2021-05-27 DIAGNOSIS — Z23 Encounter for immunization: Secondary | ICD-10-CM | POA: Diagnosis not present

## 2021-06-23 DIAGNOSIS — I129 Hypertensive chronic kidney disease with stage 1 through stage 4 chronic kidney disease, or unspecified chronic kidney disease: Secondary | ICD-10-CM | POA: Diagnosis not present

## 2021-06-23 DIAGNOSIS — N1831 Chronic kidney disease, stage 3a: Secondary | ICD-10-CM | POA: Diagnosis not present

## 2021-06-23 DIAGNOSIS — G479 Sleep disorder, unspecified: Secondary | ICD-10-CM | POA: Diagnosis not present

## 2021-06-23 DIAGNOSIS — R630 Anorexia: Secondary | ICD-10-CM | POA: Diagnosis not present

## 2021-06-23 DIAGNOSIS — E669 Obesity, unspecified: Secondary | ICD-10-CM | POA: Diagnosis not present

## 2021-06-25 DIAGNOSIS — H16101 Unspecified superficial keratitis, right eye: Secondary | ICD-10-CM | POA: Diagnosis not present

## 2021-07-01 ENCOUNTER — Ambulatory Visit: Payer: Medicare Other | Admitting: Dermatology

## 2021-07-07 ENCOUNTER — Telehealth: Payer: Self-pay | Admitting: Cardiovascular Disease

## 2021-07-07 NOTE — Telephone Encounter (Signed)
Patient called to make Dr. [name] aware that she has been having some pain between her shoulder blades.  She was evaluated by the Nurse at [facility] today. Her BP was good, her oxygen was good and no fever. She is weak, having trouble sleeping and does not have much of an appetite.  The Nurse at [facility] wanted the patient to come in and be treated due to her Cardiac history.  The patient is scheduled to see Dr. [name] 08/05/21 but feels like that is not soon enough. The patient does not want to go to the ER

## 2021-07-07 NOTE — Telephone Encounter (Signed)
Spoke to patient she stated she had pain between shoulder blades last night that lasted appox 30 min or more.Stated today she don't feel right.Stated her mid back is sore.She is weak.She rates soreness # 3 to # 4.Advised she needs to go to [facility] ED to be evaluated.

## 2021-07-08 ENCOUNTER — Emergency Department (HOSPITAL_BASED_OUTPATIENT_CLINIC_OR_DEPARTMENT_OTHER): Payer: Medicare Other | Admitting: Radiology

## 2021-07-08 ENCOUNTER — Inpatient Hospital Stay (HOSPITAL_BASED_OUTPATIENT_CLINIC_OR_DEPARTMENT_OTHER)
Admission: EM | Admit: 2021-07-08 | Discharge: 2021-07-11 | DRG: 281 | Disposition: A | Discharge status: Home / Self Care | Payer: Medicare Other | Attending: Cardiology | Admitting: Cardiology

## 2021-07-08 ENCOUNTER — Encounter (HOSPITAL_BASED_OUTPATIENT_CLINIC_OR_DEPARTMENT_OTHER): Payer: Self-pay | Admitting: Emergency Medicine

## 2021-07-08 ENCOUNTER — Other Ambulatory Visit: Payer: Self-pay

## 2021-07-08 DIAGNOSIS — I252 Old myocardial infarction: Secondary | ICD-10-CM

## 2021-07-08 DIAGNOSIS — I350 Nonrheumatic aortic (valve) stenosis: Secondary | ICD-10-CM | POA: Diagnosis not present

## 2021-07-08 DIAGNOSIS — Z888 Allergy status to other drugs, medicaments and biological substances status: Secondary | ICD-10-CM | POA: Diagnosis not present

## 2021-07-08 DIAGNOSIS — Z85828 Personal history of other malignant neoplasm of skin: Secondary | ICD-10-CM

## 2021-07-08 DIAGNOSIS — Z743 Need for continuous supervision: Secondary | ICD-10-CM | POA: Diagnosis not present

## 2021-07-08 DIAGNOSIS — K219 Gastro-esophageal reflux disease without esophagitis: Secondary | ICD-10-CM | POA: Diagnosis present

## 2021-07-08 DIAGNOSIS — Z9049 Acquired absence of other specified parts of digestive tract: Secondary | ICD-10-CM | POA: Diagnosis not present

## 2021-07-08 DIAGNOSIS — I25110 Atherosclerotic heart disease of native coronary artery with unstable angina pectoris: Secondary | ICD-10-CM | POA: Diagnosis present

## 2021-07-08 DIAGNOSIS — I10 Essential (primary) hypertension: Secondary | ICD-10-CM | POA: Diagnosis not present

## 2021-07-08 DIAGNOSIS — I959 Hypotension, unspecified: Secondary | ICD-10-CM | POA: Diagnosis not present

## 2021-07-08 DIAGNOSIS — Z79899 Other long term (current) drug therapy: Secondary | ICD-10-CM | POA: Diagnosis not present

## 2021-07-08 DIAGNOSIS — I214 Non-ST elevation (NSTEMI) myocardial infarction: Principal | ICD-10-CM | POA: Diagnosis present

## 2021-07-08 DIAGNOSIS — I499 Cardiac arrhythmia, unspecified: Secondary | ICD-10-CM | POA: Diagnosis not present

## 2021-07-08 DIAGNOSIS — R079 Chest pain, unspecified: Secondary | ICD-10-CM | POA: Diagnosis not present

## 2021-07-08 DIAGNOSIS — Z79890 Hormone replacement therapy (postmenopausal): Secondary | ICD-10-CM

## 2021-07-08 DIAGNOSIS — Z20822 Contact with and (suspected) exposure to covid-19: Secondary | ICD-10-CM | POA: Diagnosis present

## 2021-07-08 DIAGNOSIS — Z90710 Acquired absence of both cervix and uterus: Secondary | ICD-10-CM | POA: Diagnosis not present

## 2021-07-08 DIAGNOSIS — D649 Anemia, unspecified: Secondary | ICD-10-CM | POA: Diagnosis not present

## 2021-07-08 DIAGNOSIS — Z951 Presence of aortocoronary bypass graft: Secondary | ICD-10-CM

## 2021-07-08 DIAGNOSIS — N179 Acute kidney failure, unspecified: Secondary | ICD-10-CM | POA: Diagnosis present

## 2021-07-08 DIAGNOSIS — Z7982 Long term (current) use of aspirin: Secondary | ICD-10-CM

## 2021-07-08 DIAGNOSIS — E039 Hypothyroidism, unspecified: Secondary | ICD-10-CM | POA: Diagnosis not present

## 2021-07-08 DIAGNOSIS — E785 Hyperlipidemia, unspecified: Secondary | ICD-10-CM | POA: Diagnosis present

## 2021-07-08 DIAGNOSIS — Z96653 Presence of artificial knee joint, bilateral: Secondary | ICD-10-CM | POA: Diagnosis present

## 2021-07-08 DIAGNOSIS — Z8249 Family history of ischemic heart disease and other diseases of the circulatory system: Secondary | ICD-10-CM | POA: Diagnosis not present

## 2021-07-08 LAB — COMPREHENSIVE METABOLIC PANEL
ALT: 11 U/L (ref 0–44)
AST: 31 U/L (ref 15–41)
Albumin: 3.6 g/dL (ref 3.5–5.0)
Alkaline Phosphatase: 106 U/L (ref 38–126)
Anion gap: 10 (ref 5–15)
BUN: 46 mg/dL — ABNORMAL HIGH (ref 8–23)
CO2: 22 mmol/L (ref 22–32)
Calcium: 9.2 mg/dL (ref 8.9–10.3)
Chloride: 100 mmol/L (ref 98–111)
Creatinine, Ser: 1.5 mg/dL — ABNORMAL HIGH (ref 0.44–1.00)
GFR, Estimated: 33 mL/min — ABNORMAL LOW (ref >60)
Glucose, Bld: 117 mg/dL — ABNORMAL HIGH (ref 70–99)
Potassium: 4.1 mmol/L (ref 3.5–5.1)
Sodium: 132 mmol/L — ABNORMAL LOW (ref 135–145)
Total Bilirubin: 0.5 mg/dL (ref 0.3–1.2)
Total Protein: 6.5 g/dL (ref 6.5–8.1)

## 2021-07-08 LAB — CBC WITH DIFFERENTIAL/PLATELET
Abs Immature Granulocytes: 0.05 10*3/uL (ref 0.00–0.07)
Basophils Absolute: 0 10*3/uL (ref 0.0–0.1)
Basophils Relative: 0 %
Eosinophils Absolute: 0.1 10*3/uL (ref 0.0–0.5)
Eosinophils Relative: 1 %
HCT: 29.7 % — ABNORMAL LOW (ref 36.0–46.0)
Hemoglobin: 9.6 g/dL — ABNORMAL LOW (ref 12.0–15.0)
Immature Granulocytes: 1 %
Lymphocytes Relative: 27 %
Lymphs Abs: 3 10*3/uL (ref 0.7–4.0)
MCH: 32.3 pg (ref 26.0–34.0)
MCHC: 32.3 g/dL (ref 30.0–36.0)
MCV: 100 fL (ref 80.0–100.0)
Monocytes Absolute: 1.2 10*3/uL — ABNORMAL HIGH (ref 0.1–1.0)
Monocytes Relative: 11 %
Neutro Abs: 6.7 10*3/uL (ref 1.7–7.7)
Neutrophils Relative %: 60 %
Platelets: 225 10*3/uL (ref 150–400)
RBC: 2.97 MIL/uL — ABNORMAL LOW (ref 3.87–5.11)
RDW: 14 % (ref 11.5–15.5)
WBC: 11 10*3/uL — ABNORMAL HIGH (ref 4.0–10.5)
nRBC: 0 % (ref 0.0–0.2)

## 2021-07-08 LAB — RESP PANEL BY RT-PCR (FLU A&B, COVID) ARPGX2
Influenza A by PCR: NEGATIVE
Influenza B by PCR: NEGATIVE
SARS Coronavirus 2 by RT PCR: NEGATIVE

## 2021-07-08 LAB — HEMOGLOBIN A1C
Hgb A1c MFr Bld: 6.3 % — ABNORMAL HIGH (ref 4.8–5.6)
Mean Plasma Glucose: 134.11 mg/dL

## 2021-07-08 LAB — TROPONIN I (HIGH SENSITIVITY)
Troponin I (High Sensitivity): 4604 ng/L (ref <18)
Troponin I (High Sensitivity): 4756 ng/L (ref <18)
Troponin I (High Sensitivity): 3258 ng/L (ref <18)

## 2021-07-08 LAB — HEPARIN LEVEL (UNFRACTIONATED): Heparin Unfractionated: 0.81 IU/mL — ABNORMAL HIGH (ref 0.30–0.70)

## 2021-07-08 LAB — TSH: TSH: 2.414 u[IU]/mL (ref 0.350–4.500)

## 2021-07-08 MED ORDER — ADULT MULTIVITAMIN W/MINERALS CH
1.0000 | ORAL_TABLET | Freq: Every day | ORAL | Status: DC
  Administered 2021-07-09 – 2021-07-11 (×3): 1 via ORAL
  Filled 2021-07-08 (×3): qty 1

## 2021-07-08 MED ORDER — TIMOLOL MALEATE 0.5 % OP SOLN
1.0000 [drp] | Freq: Every morning | OPHTHALMIC | Status: DC
  Administered 2021-07-09 – 2021-07-11 (×3): 1 [drp] via OPHTHALMIC
  Filled 2021-07-08: qty 5

## 2021-07-08 MED ORDER — ISOSORBIDE MONONITRATE ER 30 MG PO TB24
30.0000 mg | ORAL_TABLET | Freq: Every day | ORAL | Status: DC
  Administered 2021-07-09 – 2021-07-11 (×3): 30 mg via ORAL
  Filled 2021-07-08 (×3): qty 1

## 2021-07-08 MED ORDER — LEVOTHYROXINE SODIUM 75 MCG PO TABS
75.0000 ug | ORAL_TABLET | Freq: Every day | ORAL | Status: DC
  Administered 2021-07-09 – 2021-07-11 (×3): 75 ug via ORAL
  Filled 2021-07-08 (×3): qty 1

## 2021-07-08 MED ORDER — AMITRIPTYLINE HCL 25 MG PO TABS
12.5000 mg | ORAL_TABLET | Freq: Every day | ORAL | Status: DC
  Administered 2021-07-09 – 2021-07-10 (×2): 12.5 mg via ORAL
  Filled 2021-07-08 (×4): qty 0.5

## 2021-07-08 MED ORDER — ASPIRIN EC 81 MG PO TBEC: 81.0000 mg | DELAYED_RELEASE_TABLET | Freq: Every day | ORAL | Status: DC

## 2021-07-08 MED ORDER — VITAMIN E 180 MG (400 UNIT) PO CAPS
400.0000 mg | ORAL_CAPSULE | Freq: Every day | ORAL | Status: DC
  Administered 2021-07-09 – 2021-07-11 (×3): 800 [IU] via ORAL
  Filled 2021-07-08 (×3): qty 2

## 2021-07-08 MED ORDER — ONDANSETRON HCL 4 MG/2ML IJ SOLN: 4.0000 mg | Freq: Four times a day (QID) | INTRAMUSCULAR | Status: DC | PRN

## 2021-07-08 MED ORDER — ATORVASTATIN CALCIUM 40 MG PO TABS
40.0000 mg | ORAL_TABLET | Freq: Every day | ORAL | Status: DC
  Administered 2021-07-09 – 2021-07-11 (×3): 40 mg via ORAL
  Filled 2021-07-08 (×3): qty 1

## 2021-07-08 MED ORDER — PANTOPRAZOLE SODIUM 40 MG PO TBEC
40.0000 mg | DELAYED_RELEASE_TABLET | Freq: Every day | ORAL | Status: DC
  Administered 2021-07-09 – 2021-07-11 (×3): 40 mg via ORAL
  Filled 2021-07-08 (×3): qty 1

## 2021-07-08 MED ORDER — MULTIVITAMINS PO CAPS: 1.0000 | ORAL_CAPSULE | Freq: Every day | ORAL | Status: DC

## 2021-07-08 MED ORDER — ACETAMINOPHEN 325 MG PO TABS
650.0000 mg | ORAL_TABLET | ORAL | Status: DC | PRN
  Administered 2021-07-08: 650 mg via ORAL
  Filled 2021-07-08: qty 2

## 2021-07-08 MED ORDER — ALPRAZOLAM 0.25 MG PO TABS
0.2500 mg | ORAL_TABLET | Freq: Every evening | ORAL | Status: DC | PRN
  Administered 2021-07-08 – 2021-07-10 (×3): 0.25 mg via ORAL
  Filled 2021-07-08 (×3): qty 1

## 2021-07-08 MED ORDER — ASPIRIN EC 81 MG PO TBEC
81.0000 mg | DELAYED_RELEASE_TABLET | Freq: Every day | ORAL | Status: DC
  Administered 2021-07-09 – 2021-07-11 (×3): 81 mg via ORAL
  Filled 2021-07-08 (×3): qty 1

## 2021-07-08 MED ORDER — ASPIRIN 81 MG PO CHEW
324.0000 mg | CHEWABLE_TABLET | Freq: Once | ORAL | Status: AC
  Administered 2021-07-08: 324 mg via ORAL
  Filled 2021-07-08: qty 4

## 2021-07-08 MED ORDER — CHLORDIAZEPOXIDE-AMITRIPTYLINE 5-12.5 MG PO TABS: 1.0000 | ORAL_TABLET | Freq: Every day | ORAL | Status: DC

## 2021-07-08 MED ORDER — VITAMIN D 25 MCG (1000 UNIT) PO TABS
2000.0000 [IU] | ORAL_TABLET | Freq: Every day | ORAL | Status: DC
  Administered 2021-07-09 – 2021-07-11 (×3): 2000 [IU] via ORAL
  Filled 2021-07-08 (×3): qty 2

## 2021-07-08 MED ORDER — NIACIN 500 MG PO TABS
500.0000 mg | ORAL_TABLET | Freq: Every day | ORAL | Status: DC
  Administered 2021-07-09 – 2021-07-11 (×3): 500 mg via ORAL
  Filled 2021-07-08 (×4): qty 1

## 2021-07-08 MED ORDER — CHLORDIAZEPOXIDE HCL 25 MG PO CAPS
25.0000 mg | ORAL_CAPSULE | Freq: Every day | ORAL | Status: DC
  Administered 2021-07-09 – 2021-07-10 (×2): 25 mg via ORAL
  Filled 2021-07-08 (×3): qty 1

## 2021-07-08 MED ORDER — HEPARIN BOLUS VIA INFUSION
4000.0000 [IU] | Freq: Once | INTRAVENOUS | Status: AC
  Administered 2021-07-08: 4000 [IU] via INTRAVENOUS

## 2021-07-08 MED ORDER — HEPARIN (PORCINE) 25000 UT/250ML-% IV SOLN
700.0000 [IU]/h | INTRAVENOUS | Status: DC
  Administered 2021-07-08: 800 [IU]/h via INTRAVENOUS
  Filled 2021-07-08 (×2): qty 250

## 2021-07-08 MED ORDER — ESTRADIOL 1 MG PO TABS
1.0000 mg | ORAL_TABLET | Freq: Every day | ORAL | Status: DC
  Administered 2021-07-09 – 2021-07-11 (×3): 1 mg via ORAL
  Filled 2021-07-08 (×3): qty 1

## 2021-07-08 MED ORDER — METOPROLOL SUCCINATE ER 100 MG PO TB24
100.0000 mg | ORAL_TABLET | Freq: Every day | ORAL | Status: DC
  Administered 2021-07-09 – 2021-07-11 (×3): 100 mg via ORAL
  Filled 2021-07-08 (×3): qty 1

## 2021-07-08 MED ORDER — NITROGLYCERIN 0.4 MG SL SUBL: 0.4000 mg | SUBLINGUAL_TABLET | SUBLINGUAL | Status: DC | PRN

## 2021-07-08 NOTE — ED Provider Notes (Signed)
Emergency Department Provider Note   I have reviewed the triage vital signs and the nursing notes.   HISTORY  Chief Complaint Gastroesophageal Reflux   HPI Kathryn Carlson is a 87 y.o. female with past medical history reviewed below including CAD with prior CABG presents with chest discomfort and belching yesterday.  She states pain radiated to between her shoulder blades in her back.  She developed some residual soreness but her pain is gone at this point.  [facility] and discussed her symptoms with her nurse who encouraged her to present to the emergency department today given her cardiac history.  She is no longer having pain or pressure.  No discomfort between the shoulder blades currently.  No vomiting, diaphoresis, or other symptoms. No SOB.  Past Medical History:  Diagnosis Date   BCC (basal cell carcinoma of skin) 01/09/2009   Lower Central Back (tx p bx)   CAD (coronary artery disease)    possible ant wall MI ('97) - cath & CABG x5   Exogenous obesity    GERD (gastroesophageal reflux disease)    History of nuclear stress test 09/2012   lexiscan; mild perfusion defect in apical anterior & apical region (infarct/scar) - no significant ischemia, low risk    History of total bilateral knee replacement    Hypertension    Hypothyroidism    Nodular basal cell carcinoma (BCC) 08/17/2017   Left Calf (tx p bx)   PVC's (premature ventricular contractions)    SCC (squamous cell carcinoma) [place] 01/29/2004   Right Forearm (Cx3,5FU)   SCCA (squamous cell carcinoma) of skin 03/13/2005   Mid Upper Back   SCCA (squamous cell carcinoma) of skin 09/07/2005   Left Elbow(Cx3,5FU)   SCCA (squamous cell carcinoma) of skin 01/25/2006   Left Brow(in situ) (Cx3,5FU)   SCCA (squamous cell carcinoma) of skin 04/27/2006   Inner Left Shoulder(Keratoacanthoma) (Exc.)   SCCA (squamous cell carcinoma) of skin 03/22/2007   Left Temple(in situ) and  Bridge of Nose(in situ) (Cs3,5FU)   SCCA (squamous cell carcinoma) of skin 05/12/2007   Top of Scalp(Cx3,5FU)   SCCA (squamous cell carcinoma) of skin 01/28/2011   Right Upper Arm(in situ)   SCCA (squamous cell carcinoma) of skin 07/26/2012   Glabella, Inferior Tip (Cx3,5FU)   SCCA (squamous cell carcinoma) of skin 03/08/2013   Left Front Scalp(in situ) and Upper Nose(in situ) (Cx3,5FU)   SCCA (squamous cell carcinoma) of skin 07/24/2013   Right Lower Leg(Keratoacanthoma)   SCCA (squamous cell carcinoma) of skin 09/11/2015   Left Scalp(in situ) (Cx3,5FU)   SCCA (squamous cell carcinoma) of skin 01/15/2016   Left Forearm(in situ) and Left Temple(in situ) (tx p bx)   SCCA (squamous cell carcinoma) of skin 10/01/2016   Left Mid Forearm(in situ)(tx p bx) and Left Front Scalp(watch)   SCCA (squamous cell carcinoma) of skin 11/12/2016   Left Temple(Keratoacanthoma) (watch)   SCCA (squamous cell carcinoma) of skin 02/11/2017   Top Left Hand(Keratoacanthoma) (tx p bx)   Squamous cell carcinoma in situ (SCCIS) 11/18/1999   Above Left Outer Eyebrow   Squamous cell carcinoma of scalp    removed - Dr. Denna Haggard   Superficial basal cell carcinoma (BCC) 07/14/2004   Left Scapula (Cx3,5FU)    Patient Active Problem List   Diagnosis Date Noted   NSTEMI (non-ST elevated myocardial infarction) ([place]) 07/08/2021   Insomnia 05/18/2018   Fatigue 11/30/2017   Obesity (BMI 30.0-34.9) 11/30/2017   DOE (dyspnea  on exertion) 11/12/2017   Aortic stenosis, mild 02/05/2017   Frequent PVCs 06/04/2014   NSVT (nonsustained ventricular tachycardia) ([place]) 12/04/2013   HTN (hypertension) 10/25/2013   Dyslipidemia 10/25/2013   S/P CABG x 3 10/25/2013   Symptomatic bradycardia 10/25/2013    Past Surgical History:  Procedure Laterality Date   ABDOMINAL HYSTERECTOMY  1984   APPENDECTOMY  1941   Carotid Doppler  12/07/2012   R & L ICA - 0-49% diameter reduction   CORONARY ARTERY BYPASS GRAFT  09/1996   cath  & CABG x5 LIMA-LAD, SVG-sequential OD & OM, SVG sequential to PDA & PLA (Dr. Prescott Gum)   REPLACEMENT TOTAL KNEE Bilateral 2001 & 2003   TONSILLECTOMY     TRANSTHORACIC ECHOCARDIOGRAM  08/23/2013   EF 50-55%, LV cavity size mod reduced, mild LVH, mild conc hypertrophy; mild AV stenosis & mild regurg; mild MR - ordered for murmur    Allergies Novocain [procaine]  Family History  Problem Relation Age of Onset   Heart attack Mother    Heart attack Father    Cancer Sister    Diabetes Sister    Heart Problems Sister     Social History Social History   Tobacco Use   Smoking status: Never   Smokeless tobacco: Never  Substance Use Topics   Alcohol use: Yes    Comment: "a drink of wine every now and then"    Review of Systems  Constitutional: No fever/chills Eyes: No visual changes. ENT: No sore throat. Cardiovascular: Positive chest pain. Respiratory: Denies shortness of breath. Gastrointestinal: No abdominal pain.  No nausea, no vomiting.  No diarrhea.  No constipation. Genitourinary: Negative for dysuria. Musculoskeletal: Negative for back pain. Skin: Negative for rash. Neurological: Negative for headaches, focal weakness or numbness.  10-point ROS otherwise negative.  ____________________________________________   PHYSICAL EXAM:  VITAL SIGNS: ED Triage Vitals  Enc Vitals Group     BP 07/08/21 0926 (!) 97/50     Pulse Rate 07/08/21 0926 71     Resp 07/08/21 0926 16     Temp 07/08/21 0926 98.6 F (37 C)     Temp Source 07/08/21 0926 Oral     SpO2 07/08/21 0926 97 %     Weight 07/08/21 0923 161 lb (73 kg)     Height 07/08/21 0923 5\' 3"  (1.6 m)   Constitutional: Alert and oriented. Well appearing and in no acute distress. Eyes: Conjunctivae are normal. Head: Atraumatic. Nose: No congestion/rhinnorhea. Mouth/Throat: Mucous membranes are moist.  Neck: No stridor. Cardiovascular: Normal rate, regular rhythm. Good peripheral circulation. Grossly normal heart  sounds.   Respiratory: Normal respiratory effort.  No retractions. Lungs CTAB. Gastrointestinal: Soft and nontender. No distention.  Musculoskeletal: No lower extremity tenderness nor edema. No gross deformities of extremities. Neurologic:  Normal speech and language. No gross focal neurologic deficits are appreciated.  Skin:  Skin is warm, dry and intact. No rash noted.  ____________________________________________   LABS (all labs ordered are listed, but only abnormal results are displayed)  Labs Reviewed  COMPREHENSIVE METABOLIC PANEL - Abnormal; Notable for the following components:      Result Value   Sodium 132 (*)    Glucose, Bld 117 (*)    BUN 46 (*)    Creatinine, Ser 1.50 (*)    GFR, Estimated 33 (*)    All other components within normal limits  CBC WITH DIFFERENTIAL/PLATELET - Abnormal; Notable for the following components:   WBC 11.0 (*)    RBC 2.97 (*)  Hemoglobin 9.6 (*)    HCT 29.7 (*)    Monocytes Absolute 1.2 (*)    All other components within normal limits  TROPONIN I (HIGH SENSITIVITY) - Abnormal; Notable for the following components:   Troponin I (High Sensitivity) 4,604 (*)    All other components within normal limits  RESP PANEL BY RT-PCR (FLU A&B, COVID) [id number]  TROPONIN I (HIGH SENSITIVITY)   ____________________________________________  EKG   EKG Interpretation  Date/Time:  Tuesday July 08 2021 09:23:58 EDT Ventricular Rate:  70 PR Interval:  189 QRS Duration: 116 QT Interval:  374 QTC Calculation: 404 R Axis:   8 Text Interpretation: Sinus rhythm Nonspecific intraventricular conduction delay Borderline repolarization abnormality Similar to 2013 tracing Confirmed by Nanda Quinton [phone]) on 07/08/2021 9:55:09 AM         ____________________________________________  RADIOLOGY  DG Chest 2 View  Result Date: 07/08/2021 CLINICAL DATA:  Chest pain EXAM: CHEST - 2 VIEW COMPARISON:  05/10/2012 FINDINGS: Heart is normal in size. Status post  median sternotomy and CABG. Both lungs are clear. Disc degenerative disease thoracic spine. IMPRESSION: No acute abnormality of the lungs. Electronically Signed   By: Eddie Candle M.D.   On: 07/08/2021 10:26    ____________________________________________   PROCEDURES  Procedure(s) performed:   Procedures  CRITICAL CARE Performed by: Margette Fast Total critical care time: 35 minutes Critical care time was exclusive of separately billable procedures and treating other patients. Critical care was necessary to treat or prevent imminent or life-threatening deterioration. Critical care was time spent personally by me on the following activities: development of treatment plan with patient and/or surrogate as well as nursing, discussions with consultants, evaluation of patient's response to treatment, examination of patient, obtaining history from patient or surrogate, ordering and performing treatments and interventions, ordering and review of laboratory studies, ordering and review of radiographic studies, pulse oximetry and re-evaluation of patient's condition.  Nanda Quinton, MD Emergency Medicine  ____________________________________________   INITIAL IMPRESSION / ASSESSMENT AND PLAN / ED COURSE  Pertinent labs & imaging results that were available during my care of the patient were reviewed by me and considered in my medical decision making (see chart for details).   Patient presents emergency department with chest pain mainly yesterday.  Some burping and GI symptoms as well.  Patient does have a Joaquina Nissen cardiac history.  EKG interpreted by me as above which appears similar to prior tracings.  Plan for ACS evaluation.  Very low suspicion for dissection or PE although these were considered  11:30 AM  Called by lab to make [place] aware of elevated troponin.  Patient's opponent is [id number].  I have added on a COVID test.  Second troponin is pending.  Plan for discussion with cardiology and likely  admission.  Discussed with patient and she is very resistant to repeat heart cath or additional surgeries.   Discussed patient's case with Cardiology, Dr. Lavonna Monarch to request admission. Patient and family (if present) updated with plan. Care transferred to Cardiology service.  I reviewed all nursing notes, vitals, pertinent old records, EKGs, labs, imaging (as available).  ____________________________________________  FINAL CLINICAL IMPRESSION(S) / ED DIAGNOSES  Final diagnoses:  NSTEMI (non-ST elevated myocardial infarction) ([place])     MEDICATIONS GIVEN DURING THIS VISIT:  Medications  aspirin chewable tablet 324 mg (has no administration in time range)    Note:  This document was prepared using Dragon voice recognition software and may include unintentional dictation errors.  Nanda Quinton, MD, [facility] Emergency  Medicine    Deionna Marcantonio, [facility], MD 07/08/21 [phone]

## 2021-07-08 NOTE — ED Notes (Signed)
Report given to [facility]  

## 2021-07-08 NOTE — H&P (Addendum)
Cardiology Admission History and Physical:   Patient ID: Kathryn Carlson MRN: [id number]; DOB: 03-23-1927   Admission date: 07/08/2021  PCP:  Burnard Bunting, MD   [facility] Providers Cardiologist:  Quay Burow, MD   { Chief Complaint: Pain between shoulder blade   Patient Profile:   Kathryn Carlson is a 87 y.o. female with hx of CAD s/p remote CABG in 1997, PVCs, hypertension, hyperlipidemia, hypothyroidism, GERD, and skin cancer who is being seen 07/08/2021 for the evaluation of NSTEMI.   She has a history of MI at the age of 60 and underwent CABG in 1997 by Dr. Nils Pyle. Her last ischemic work-up was a Myoview in 2013 which showed no ischemia. Last Echo in 02/2020 showed LVEF of 50-55% with normal wall motion, grade 2 diastolic dysfunction, moderate AI, mild AS, and mildly elevated PASP  Last seen by APP 01/28/2021.   History of Present Illness:   Kathryn Carlson was in usual state of health up until Sunday night when had pain between her shoulder blades.  Her episode lasted for few hours.  She also has some burning sensation and felt like "acid reflux".  This is associated with nausea but no vomiting.  She felt weak.  No shortness of breath or palpitation.  Symptoms improved but recurred intermittently yesterday.  See lives at friend's home in independent facility.  She was seen by nurse and advised to come to ER.  She was found to have elevated troponin at 4604>>4756.  Started on IV heparin and transferred to [facility] for further evaluation. Lost about 25lb in past 9 months.   Scr 1.5 Hgb 9.6   Past Medical History:  Diagnosis Date   BCC (basal cell carcinoma of skin) 01/09/2009   Lower Central Back (tx p bx)   CAD (coronary artery disease)    possible ant wall MI ('97) - cath & CABG x5   Exogenous obesity    GERD (gastroesophageal reflux disease)    History of nuclear stress test 09/2012   lexiscan; mild perfusion defect in apical anterior & apical region  (infarct/scar) - no significant ischemia, low risk    History of total bilateral knee replacement    Hypertension    Hypothyroidism    Nodular basal cell carcinoma (BCC) 08/17/2017   Left Calf (tx p bx)   PVC's (premature ventricular contractions)    SCC (squamous cell carcinoma) [facility] 01/29/2004   Right Forearm (Cx3,5FU)   SCCA (squamous cell carcinoma) of skin 03/13/2005   Mid Upper Back   SCCA (squamous cell carcinoma) of skin 09/07/2005   Left Elbow(Cx3,5FU)   SCCA (squamous cell carcinoma) of skin 01/25/2006   Left Brow(in situ) (Cx3,5FU)   SCCA (squamous cell carcinoma) of skin 04/27/2006   Inner Left Shoulder(Keratoacanthoma) (Exc.)   SCCA (squamous cell carcinoma) of skin 03/22/2007   Left Temple(in situ) and Bridge of Nose(in situ) (Cs3,5FU)   SCCA (squamous cell carcinoma) of skin 05/12/2007   Top of Scalp(Cx3,5FU)   SCCA (squamous cell carcinoma) of skin 01/28/2011   Right Upper Arm(in situ)   SCCA (squamous cell carcinoma) of skin 07/26/2012   Glabella, Inferior Tip (Cx3,5FU)   SCCA (squamous cell carcinoma) of skin 03/08/2013   Left Front Scalp(in situ) and Upper Nose(in situ) (Cx3,5FU)   SCCA (squamous cell carcinoma) of skin 07/24/2013   Right Lower Leg(Keratoacanthoma)   SCCA (squamous cell carcinoma) of skin 09/11/2015   Left Scalp(in situ) (Cx3,5FU)   SCCA (squamous cell carcinoma) of skin 01/15/2016  Left Forearm(in situ) and Left Temple(in situ) (tx p bx)   SCCA (squamous cell carcinoma) of skin 10/01/2016   Left Mid Forearm(in situ)(tx p bx) and Left Front Scalp(watch)   SCCA (squamous cell carcinoma) of skin 11/12/2016   Left Temple(Keratoacanthoma) (watch)   SCCA (squamous cell carcinoma) of skin 02/11/2017   Top Left Hand(Keratoacanthoma) (tx p bx)   Squamous cell carcinoma in situ (SCCIS) 11/18/1999   Above Left Outer Eyebrow   Squamous cell carcinoma of scalp    removed - Dr. Denna Haggard   Superficial basal cell carcinoma (BCC) 07/14/2004   Left  Scapula (Cx3,5FU)    Past Surgical History:  Procedure Laterality Date   ABDOMINAL HYSTERECTOMY  1984   APPENDECTOMY  1941   Carotid Doppler  12/07/2012   R & L ICA - 0-49% diameter reduction   CORONARY ARTERY BYPASS GRAFT  09/1996   cath & CABG x5 LIMA-LAD, SVG-sequential OD & OM, SVG sequential to PDA & PLA (Dr. Prescott Gum)   REPLACEMENT TOTAL KNEE Bilateral 2001 & 2003   TONSILLECTOMY     TRANSTHORACIC ECHOCARDIOGRAM  08/23/2013   EF 50-55%, LV cavity size mod reduced, mild LVH, mild conc hypertrophy; mild AV stenosis & mild regurg; mild MR - ordered for murmur     Medications Prior to Admission: Prior to Admission medications   Medication Sig Start Date End Date Taking? Authorizing Provider  aspirin 81 MG tablet Take 81 mg by mouth daily.    Yes [provider]  atorvastatin (LIPITOR) 40 MG tablet TAKE 1 TABLET BY MOUTH EVERY DAY 03/05/21  Yes Cleaver, Jossie Ng, NP  augmented betamethasone dipropionate (DIPROLENE-AF) 0.05 % cream Apply topically 2 (two) times daily.   Yes [provider]  chloridazePOXIDE-amitriptyline (LIMBITROL) 5-12.5 MG per tablet Take 1 tablet by mouth daily.   Yes [provider]  cholecalciferol (VITAMIN D) 1000 UNITS tablet Take 2,000 Units by mouth daily.   Yes [provider]  estradiol (ESTRACE) 1 MG tablet Take 1 mg by mouth daily.   Yes [provider]  fluocinonide (LIDEX) 0.05 % external solution Apply 1 application topically 2 (two) times daily.    Yes [provider]  hydrochlorothiazide (MICROZIDE) 12.5 MG capsule Take 1 capsule (12.5 mg total) by mouth daily. 09/04/20 07/08/21 Yes Lorretta Harp, MD  hydrocortisone 2.5 % cream APPLY TO RASH/ITCHY SKIN AT BEDTIME 11/18/20  Yes Lavonna Monarch, MD  isosorbide mononitrate (IMDUR) 30 MG 24 hr tablet TAKE 1 TABLET BY MOUTH EVERY DAY Patient taking differently: Take 30 mg by mouth daily. 03/05/21  Yes Cleaver, Jossie Ng, NP  levothyroxine (SYNTHROID,  LEVOTHROID) 75 MCG tablet Take 75 mcg by mouth daily before breakfast.   Yes [provider]  metoprolol succinate (TOPROL-XL) 100 MG 24 hr tablet TAKE 1 TABLET (100 MG TOTAL) BY MOUTH DAILY. TAKE WITH OR IMMEDIATELY FOLLOWING A MEAL. 10/28/20 07/08/21 Yes Lorretta Harp, MD  Multiple Vitamin (MULTIVITAMIN) capsule Take 1 capsule by mouth daily.   Yes [provider]  mupirocin ointment (BACTROBAN) 2 % APPLY TO AFFECTED AREA TWICE A DAY 02/24/21  Yes Lavonna Monarch, MD  niacin 500 MG tablet Take 500 mg by mouth daily with breakfast.   Yes [provider]  olmesartan (BENICAR) 40 MG tablet TAKE 1 TABLET BY MOUTH EVERY DAY 02/03/21  Yes Lorretta Harp, MD  pantoprazole (PROTONIX) 40 MG tablet TAKE 1 TABLET BY MOUTH EVERY DAY Patient taking differently: Take by mouth as needed. 12/05/13  Yes Hilty,  Nadean Corwin, MD  timolol (TIMOPTIC) 0.5 % ophthalmic solution Place 1 drop into the right eye every morning. 08/20/20  Yes [provider]  TIMOLOL HEMIHYDRATE OP Place 1 drop into the right eye daily.   Yes [provider]  VITAMIN E PO Take 400 mg by mouth daily. Plus D   Yes [provider]  Influenza vac split quadrivalent PF (FLUZONE HIGH-DOSE) 0.5 ML injection Fluzone High-Dose 2019-20 (PF) 180 mcg/0.5 mL intramuscular syringe  TO BE ADMINISTERED BY PHARMACIST FOR IMMUNIZATION    [provider]     Allergies:    Allergies  Allergen Reactions   Novocain [Procaine]     Social History:   Social History   Socioeconomic History   Marital status: Widowed    Spouse name: Not on file   Number of children: 4   Years of education: 50   Highest education level: Not on file  Occupational History   Not on file  Tobacco Use   Smoking status: Never   Smokeless tobacco: Never  Substance and Sexual Activity   Alcohol use: Yes    Comment: "a drink of wine every now and then"   Drug use: Not on file   Sexual activity: Not on file  Other  Topics Concern   Not on file  Social History Narrative   Not on file   Social Determinants of Health   Financial Resource Strain: Not on file  Food Insecurity: Not on file  Transportation Needs: Not on file  Physical Activity: Not on file  Stress: Not on file  Social Connections: Not on file  Intimate Partner Violence: Not on file    Family History:   The patient's family history includes Cancer in her sister; Diabetes in her sister; Heart Problems in her sister; Heart attack in her father and mother.    ROS:  Please see the history of present illness.  All other ROS reviewed and negative.     Physical Exam/Data:   Vitals:   07/08/21 1328 07/08/21 1400 07/08/21 1430 07/08/21 1626  BP:   (!) 123/40 (!) 127/56  Pulse: 85 62 (!) 58 65  Resp: 18 16 12 18   Temp:   97.6 F (36.4 C) 97.7 F (36.5 C)  TempSrc:   Oral Oral  SpO2:  100% 99% 100%  Weight:      Height:       No intake or output data in the 24 hours ending 07/08/21 1714 Last 3 Weights 07/08/2021 01/28/2021 10/02/2020  Weight (lbs) 161 lb 177 lb 187 lb  Weight (kg) 73.029 kg 80.287 kg 84.823 kg     Body mass index is 28.52 kg/m.  General:  Well nourished, well developed, in no acute distress HEENT: normal Lymph: no adenopathy Neck: no JVD Endocrine:  No thryomegaly Vascular: No carotid bruits; FA pulses 2+ bilaterally without bruits  Cardiac:  normal S1, S2; RRR; + murmur  Lungs:  clear to auscultation bilaterally, no wheezing, rhonchi or rales  Abd: soft, nontender, no hepatomegaly  Ext: no edema Musculoskeletal:  No deformities, BUE and BLE strength normal and equal Skin: warm and dry  Neuro:  CNs 2-12 intact, no focal abnormalities noted Psych:  Normal affect    EKG:  The ECG that was done today was personally reviewed and demonstrates with inferior TWI   Relevant CV Studies:  ECho 02/2020  1. Mild aortic stenosis but moderate aortic regurgitation.   2. Left ventricular ejection fraction, by  estimation, is 50  to 55%. The  left ventricle has low normal function. The left ventricle has no regional  wall motion abnormalities. Left ventricular diastolic parameters are  consistent with Grade II diastolic  dysfunction (pseudonormalization).   3. Right ventricular systolic function is normal. The right ventricular  size is normal. There is mildly elevated pulmonary artery systolic  pressure.   4. The mitral valve is normal in structure. Trivial mitral valve  regurgitation. No evidence of mitral stenosis.   5. The aortic valve is tricuspid. Aortic valve regurgitation is moderate.  Mild aortic valve stenosis.   6. The inferior vena cava is normal in size with <50% respiratory  variability, suggesting right atrial pressure of 8 mmHg.   Laboratory Data:  High Sensitivity Troponin:   Recent Labs  Lab 07/08/21 0930 07/08/21 1146  TROPONINIHS 4,604* 4,756*      Chemistry Recent Labs  Lab 07/08/21 0930  NA 132*  K 4.1  CL 100  CO2 22  GLUCOSE 117*  BUN 46*  CREATININE 1.50*  CALCIUM 9.2  GFRNONAA 33*  ANIONGAP 10    Recent Labs  Lab 07/08/21 0930  PROT 6.5  ALBUMIN 3.6  AST 31  ALT 11  ALKPHOS 106  BILITOT 0.5   Hematology Recent Labs  Lab 07/08/21 0930  WBC 11.0*  RBC 2.97*  HGB 9.6*  HCT 29.7*  MCV 100.0  MCH 32.3  MCHC 32.3  RDW 14.0  PLT 225    Radiology/Studies:  DG Chest 2 View  Result Date: 07/08/2021 CLINICAL DATA:  Chest pain EXAM: CHEST - 2 VIEW COMPARISON:  05/10/2012 FINDINGS: Heart is normal in size. Status post median sternotomy and CABG. Both lungs are clear. Disc degenerative disease thoracic spine. IMPRESSION: No acute abnormality of the lungs. Electronically Signed   By: Eddie Candle M.D.   On: 07/08/2021 10:26     Assessment and Plan:   NSTEMI -Presented with greater than 24-hour history of pain between shoulder blades and "acid reflux".  This was intermittent yesterday with severe fatigue.   - High-sensitivity  troponin4604>>4756.   -EKG with new T wave inversion in inferior leads -Admit and cycle troponin -Continue IV heparin -Update echocardiogram -Patient states " I does not want invasive procedure". -Continue aspirin, statin and beta-blocker  2.  AKI -Creatinine was in 1.2 range last year -Creatinine of 1.5 today -Will hold hydrochlorothiazide and Benicar  3.  Hypertension -Hold hydrochlorothiazide and Benicar for elevated renal function -Continue beta-blocker  4.  Anemia -Last hemoglobin was 11.2 in 2019 -Hgb of 9.6 today -Follow closely while on heparin  5.  Aortic valve disorder -Echo in 2021 showed mild AI and AS - Update echo  Risk Assessment/Risk Scores:   TIMI Risk Score for Unstable Angina or Non-ST Elevation MI:   The patient's TIMI risk score is 6, which indicates a 41% risk of all cause mortality, new or recurrent myocardial infarction or need for urgent revascularization in the next 14 days.{ [id number]  Severity of Illness: The appropriate patient status for this patient is INPATIENT. Inpatient status is judged to be reasonable and necessary in order to provide the required intensity of service to ensure the patient's safety. The patient's presenting symptoms, physical exam findings, and initial radiographic and laboratory data in the context of their chronic comorbidities is felt to place them at high risk for further clinical deterioration. Furthermore, it is not anticipated that the patient will be medically stable for discharge from the hospital within 2 midnights of admission. The following factors  support the patient status of inpatient.   " The patient's presenting symptoms include pain between shoulder blade, acid reflux " The worrisome physical exam findings include murmur " The initial radiographic and laboratory data are worrisome because of elevated troponin, anemia and elevated creatinine " The chronic co-morbidities include history of CABG, age, hypertension  and anemia   * I certify that at the point of admission it is my clinical judgment that the patient will require inpatient hospital care spanning beyond 2 midnights from the point of admission due to high intensity of service, high risk for further deterioration and high frequency of surveillance required.*   For questions or updates, please contact [place] Please consult [email or web address] for contact info under     Jarrett Soho, PA  07/08/2021 5:14 PM    History and all data above reviewed.  Patient examined.  I agree with the findings as above.   The patient has a long history of CAD with distant CABG.  However, she really has done well.  She walks with a walker and lives in  independent living.   The she had discomfort that was really in her back and thought it was probably GI.  She was convinced the next day to present to the emergency room where she is found to have elevated troponins as above.  She does have some ST segment and T wave changes in the inferior leads.  She had otherwise been feeling well.  She was not having any shortness of breath.  She is not been having any PND or orthopnea.  She is not been having any palpitations, presyncope or syncope.  The patient exam reveals COR: Regular rate and rhythm, 3 of 6 apical systolic murmur radiating at the aortic outflow tract and slightly up to the axilla, no diastolic,  Lungs: Clear to auscultation bilaterally,  Abd: Positive bowel sounds normal in frequency and pitch, breathing, rebound, guarding, Ext 2+ pulses, no edema..  All available labs, radiology testing, previous records reviewed. Agree with documented assessment and plan.  Non-Q wave myocardial infarction: The patient adamantly refuses an invasive evaluation.  We did talk briefly about catheterization but she wants only medical management.  Think is very reasonable to pursue this and she is very informed and knowledgeable.  Toward that end we will continue with heparin  x48 hours.  We will titrate her nitrates.  I am going to hold off on hydrochlorothiazide and ARB's given some mild renal insufficiency.  We will adjust other meds prior to discharge and make sure she is ambulating chest pain-free.  Jeneen Rinks Kadar Chance  5:32 PM  07/08/2021

## 2021-07-08 NOTE — Progress Notes (Signed)
ANTICOAGULATION CONSULT NOTE - Follow Up Consult  Pharmacy Consult for Heparin Indication: chest pain/ACS  Allergies  Allergen Reactions   Novocain [Procaine]     Patient Measurements: Height: 5\' 3"  (160 cm) Weight: 73 kg (161 lb) IBW/kg (Calculated) : 52.4 Heparin Dosing Weight:  67.8 kg  Vital Signs: Temp: 97.7 F (36.5 C) (07/12 1921) Temp Source: Oral (07/12 1921) BP: 129/60 (07/12 1921) Pulse Rate: 70 (07/12 1921)  Labs: Recent Labs    07/08/21 0930 07/08/21 1146 07/08/21 1923  HGB 9.6*  --   --   HCT 29.7*  --   --   PLT 225  --   --   HEPARINUNFRC  --   --  0.81*  CREATININE 1.50*  --   --   TROPONINIHS 4,604* 4,756*  --     Estimated Creatinine Clearance: 23.4 mL/min (A) (by C-G formula based on SCr of 1.5 mg/dL (H)).   Assessment: Anticoag: Heparin for ACS - Hgb 9.6, plts 225, trop elevated, no AC PTA. - Hep level 0.81 slightly elevated  Goal of Therapy:  Heparin level 0.3-0.7 units/ml Monitor platelets by anticoagulation protocol: Yes   Plan:  Decrease IV heparin to 700 units/hr Daily HL and CBC   Hennesy Sobalvarro S. Alford Highland, PharmD, BCPS Clinical Staff Pharmacist [email or web address]  Alford Highland, [place] 07/08/2021,8:38 PM

## 2021-07-08 NOTE — Plan of Care (Signed)
  Problem: Education: Goal: Understanding of cardiac disease, CV risk reduction, and recovery process will improve Outcome: Progressing Goal: Understanding of medication regimen will improve Outcome: Progressing Goal: Individualized Educational Video(s) Outcome: Progressing   Problem: Activity: Goal: Ability to tolerate increased activity will improve Outcome: Progressing   Problem: Cardiac: Goal: Ability to achieve and maintain adequate cardiopulmonary perfusion will improve Outcome: Progressing Goal: Vascular access site(s) Level 0-1 will be maintained Outcome: Progressing

## 2021-07-08 NOTE — Progress Notes (Signed)
ANTICOAGULATION CONSULT NOTE - Initial Consult  Pharmacy Consult for heparin Indication: chest pain/ACS  Allergies  Allergen Reactions   Novocain [Procaine]     Patient Measurements: Height: 5\' 3"  (160 cm) Weight: 73 kg (161 lb) IBW/kg (Calculated) : 52.4 Heparin Dosing Weight: 67.8kg  Vital Signs: Temp: 98.6 F (37 C) (07/12 0926) Temp Source: Oral (07/12 0926) BP: 96/47 (07/12 1100) Pulse Rate: 56 (07/12 1100)  Labs: Recent Labs    07/08/21 0930  HGB 9.6*  HCT 29.7*  PLT 225  CREATININE 1.50*  TROPONINIHS 4,604*    Estimated Creatinine Clearance: 23.4 mL/min (A) (by C-G formula based on SCr of 1.5 mg/dL (H)).   Medical History: Past Medical History:  Diagnosis Date   BCC (basal cell carcinoma of skin) 01/09/2009   Lower Central Back (tx p bx)   CAD (coronary artery disease)    possible ant wall MI ('97) - cath & CABG x5   Exogenous obesity    GERD (gastroesophageal reflux disease)    History of nuclear stress test 09/2012   lexiscan; mild perfusion defect in apical anterior & apical region (infarct/scar) - no significant ischemia, low risk    History of total bilateral knee replacement    Hypertension    Hypothyroidism    Nodular basal cell carcinoma (BCC) 08/17/2017   Left Calf (tx p bx)   PVC's (premature ventricular contractions)    SCC (squamous cell carcinoma) [place] 01/29/2004   Right Forearm (Cx3,5FU)   SCCA (squamous cell carcinoma) of skin 03/13/2005   Mid Upper Back   SCCA (squamous cell carcinoma) of skin 09/07/2005   Left Elbow(Cx3,5FU)   SCCA (squamous cell carcinoma) of skin 01/25/2006   Left Brow(in situ) (Cx3,5FU)   SCCA (squamous cell carcinoma) of skin 04/27/2006   Inner Left Shoulder(Keratoacanthoma) (Exc.)   SCCA (squamous cell carcinoma) of skin 03/22/2007   Left Temple(in situ) and Bridge of Nose(in situ) (Cs3,5FU)   SCCA (squamous cell carcinoma) of skin 05/12/2007   Top of Scalp(Cx3,5FU)   SCCA (squamous cell carcinoma) of  skin 01/28/2011   Right Upper Arm(in situ)   SCCA (squamous cell carcinoma) of skin 07/26/2012   Glabella, Inferior Tip (Cx3,5FU)   SCCA (squamous cell carcinoma) of skin 03/08/2013   Left Front Scalp(in situ) and Upper Nose(in situ) (Cx3,5FU)   SCCA (squamous cell carcinoma) of skin 07/24/2013   Right Lower Leg(Keratoacanthoma)   SCCA (squamous cell carcinoma) of skin 09/11/2015   Left Scalp(in situ) (Cx3,5FU)   SCCA (squamous cell carcinoma) of skin 01/15/2016   Left Forearm(in situ) and Left Temple(in situ) (tx p bx)   SCCA (squamous cell carcinoma) of skin 10/01/2016   Left Mid Forearm(in situ)(tx p bx) and Left Front Scalp(watch)   SCCA (squamous cell carcinoma) of skin 11/12/2016   Left Temple(Keratoacanthoma) (watch)   SCCA (squamous cell carcinoma) of skin 02/11/2017   Top Left Hand(Keratoacanthoma) (tx p bx)   Squamous cell carcinoma in situ (SCCIS) 11/18/1999   Above Left Outer Eyebrow   Squamous cell carcinoma of scalp    removed - Dr. Denna Haggard   Superficial basal cell carcinoma (BCC) 07/14/2004   Left Scapula (Cx3,5FU)    Medications:  Infusions:   heparin      Assessment: 89 yof presented to the ED with GERD symptoms. Troponin found to be elevated and now starting IV heparin. Baseline Hgb is low at 9.6 but platelets are WNL. She is not on anticoagulation PTA.   Goal of Therapy:  Heparin level 0.3-0.7 units/ml Monitor platelets  by anticoagulation protocol: Yes   Plan:  Heparin bolus 4000 units IV x 1 Heparin gtt 800 units/hr Check an 8 hr heparin level Daily heparin level and CBC  Nishtha Raider, Rande Lawman 07/08/2021,11:45 AM

## 2021-07-08 NOTE — ED Notes (Signed)
Pt updated with POC, aware she has a room assignment and [facility] has been notified

## 2021-07-08 NOTE — Plan of Care (Signed)

## 2021-07-08 NOTE — ED Notes (Signed)
Pt transferred to [facility] with assistance, Tol well/

## 2021-07-08 NOTE — ED Triage Notes (Signed)
Pt arrives to ED with c/o of esophageal reflux since yesterday. Pt reports burning in the middle of the chest. Pt noticed pain that radiated between her shoulder blades. Today he shoulder blades feel "sore."

## 2021-07-09 ENCOUNTER — Inpatient Hospital Stay (HOSPITAL_COMMUNITY): Payer: Medicare Other

## 2021-07-09 DIAGNOSIS — I350 Nonrheumatic aortic (valve) stenosis: Secondary | ICD-10-CM | POA: Diagnosis not present

## 2021-07-09 LAB — ECHOCARDIOGRAM COMPLETE
AR max vel: 1.06 cm2
AV Area VTI: 1.01 cm2
AV Area mean vel: 0.95 cm2
AV Mean grad: 12 mmHg
AV Peak grad: 19.6 mmHg
Ao pk vel: 2.22 m/s
Area-P 1/2: 3.77 cm2
Height: 63 in
P 1/2 time: 368 msec
S' Lateral: 3.5 cm
Weight: 2580.8 oz

## 2021-07-09 LAB — BASIC METABOLIC PANEL
Anion gap: 10 (ref 5–15)
BUN: 43 mg/dL — ABNORMAL HIGH (ref 8–23)
CO2: 21 mmol/L — ABNORMAL LOW (ref 22–32)
Calcium: 8.9 mg/dL (ref 8.9–10.3)
Chloride: 103 mmol/L (ref 98–111)
Creatinine, Ser: 1.46 mg/dL — ABNORMAL HIGH (ref 0.44–1.00)
GFR, Estimated: 34 mL/min — ABNORMAL LOW (ref >60)
Glucose, Bld: 97 mg/dL (ref 70–99)
Potassium: 4.2 mmol/L (ref 3.5–5.1)
Sodium: 134 mmol/L — ABNORMAL LOW (ref 135–145)

## 2021-07-09 LAB — LIPID PANEL
Cholesterol: 119 mg/dL (ref 0–200)
HDL: 40 mg/dL — ABNORMAL LOW (ref >40)
LDL Cholesterol: 60 mg/dL (ref 0–99)
Total CHOL/HDL Ratio: 3 RATIO
Triglycerides: 97 mg/dL (ref <150)
VLDL: 19 mg/dL (ref 0–40)

## 2021-07-09 LAB — CBC
HCT: 28.2 % — ABNORMAL LOW (ref 36.0–46.0)
Hemoglobin: 9.1 g/dL — ABNORMAL LOW (ref 12.0–15.0)
MCH: 32.9 pg (ref 26.0–34.0)
MCHC: 32.3 g/dL (ref 30.0–36.0)
MCV: 101.8 fL — ABNORMAL HIGH (ref 80.0–100.0)
Platelets: 225 10*3/uL (ref 150–400)
RBC: 2.77 MIL/uL — ABNORMAL LOW (ref 3.87–5.11)
RDW: 13.6 % (ref 11.5–15.5)
WBC: 10.1 10*3/uL (ref 4.0–10.5)
nRBC: 0 % (ref 0.0–0.2)

## 2021-07-09 LAB — TROPONIN I (HIGH SENSITIVITY): Troponin I (High Sensitivity): 3864 ng/L (ref <18)

## 2021-07-09 LAB — HEPARIN LEVEL (UNFRACTIONATED): Heparin Unfractionated: 0.6 IU/mL (ref 0.30–0.70)

## 2021-07-09 MED ORDER — CLOPIDOGREL BISULFATE 75 MG PO TABS
75.0000 mg | ORAL_TABLET | Freq: Every day | ORAL | Status: DC
  Administered 2021-07-09 – 2021-07-11 (×3): 75 mg via ORAL
  Filled 2021-07-09 (×3): qty 1

## 2021-07-09 MED ORDER — DIPHENHYDRAMINE HCL 25 MG PO CAPS
25.0000 mg | ORAL_CAPSULE | Freq: Once | ORAL | Status: AC
  Administered 2021-07-09: 25 mg via ORAL
  Filled 2021-07-09: qty 1

## 2021-07-09 NOTE — Progress Notes (Signed)
ANTICOAGULATION CONSULT NOTE - Follow Up Consult  Pharmacy Consult for Heparin Indication: chest pain/ACS  Allergies  Allergen Reactions   Novocain [Procaine]    Labs: Recent Labs    07/08/21 0930 07/08/21 1146 07/08/21 1923 07/08/21 1953 07/09/21 0038  HGB 9.6*  --   --   --  9.1*  HCT 29.7*  --   --   --  28.2*  PLT 225  --   --   --  225  HEPARINUNFRC  --   --  0.81*  --  0.60  CREATININE 1.50*  --   --   --   --   TROPONINIHS 4,604* 4,917*  --  3,258*  --    Assessment: Anticoag: Heparin for ACS - Hgb 9.6, plts 225, trop elevated, no AC PTA. - Hep level 0.60 units/ml  Goal of Therapy:  Heparin level 0.3-0.7 units/ml Monitor platelets by anticoagulation protocol: Yes   Plan:  Continue IV heparin at 700 units/hr Daily HL and CBC  Caspar Favila [place] 07/09/2021,1:39 AM

## 2021-07-09 NOTE — Plan of Care (Signed)

## 2021-07-09 NOTE — Progress Notes (Signed)
Progress Note  Patient Name: Kathryn Carlson Date of Encounter: 07/09/2021  Primary Cardiologist:   Quay Burow, MD   Subjective   No chest pain.  No SOB.    Inpatient Medications    Scheduled Meds:  chlordiazePOXIDE  25 mg Oral Daily   And   amitriptyline  12.5 mg Oral Daily   aspirin EC  81 mg Oral Daily   atorvastatin  40 mg Oral Daily   cholecalciferol  2,000 Units Oral Daily   estradiol  1 mg Oral Daily   isosorbide mononitrate  30 mg Oral Daily   levothyroxine  75 mcg Oral QAC breakfast   metoprolol succinate  100 mg Oral Daily   multivitamin with minerals  1 tablet Oral Daily   niacin  500 mg Oral Q breakfast   pantoprazole  40 mg Oral Daily   timolol  1 drop Right Eye q morning   Vitamin E  400 mg Oral Daily   Continuous Infusions:  heparin 700 Units/hr (07/08/21 2043)   PRN Meds: acetaminophen, ALPRAZolam, nitroGLYCERIN, ondansetron (ZOFRAN) IV   Vital Signs    Vitals:   07/08/21 1626 07/08/21 1921 07/09/21 0024 07/09/21 0421  BP: (!) 127/56 129/60 (!) 126/45 (!) 121/48  Pulse: 65 70 68 64  Resp: 18 20 18 18   Temp: 97.7 F (36.5 C) 97.7 F (36.5 C) 97.6 F (36.4 C) 97.8 F (36.6 C)  TempSrc: Oral Oral Oral Oral  SpO2: 100% 97% 95% 97%  Weight:   73.2 kg   Height:        Intake/Output Summary (Last 24 hours) at 07/09/2021 0734 Last data filed at 07/09/2021 0423 Gross per 24 hour  Intake 522.34 ml  Output 700 ml  Net -177.66 ml   Filed Weights   07/08/21 0923 07/09/21 0024  Weight: 73 kg 73.2 kg    Telemetry    NSR - Personally Reviewed  ECG    NA - Personally Reviewed  Physical Exam   GEN: No acute distress.   Neck: No  JVD Cardiac: RRR, 3/6 apical systolic murmur, no diastolic murmurs, rubs, or gallops.  Respiratory: Clear  to auscultation bilaterally. GI: Soft, nontender, non-distended  MS: No edema; No deformity. Neuro:  Nonfocal  Psych: Normal affect   Labs    Chemistry Recent Labs  Lab 07/08/21 0930  07/09/21 0038  NA 132* 134*  K 4.1 4.2  CL 100 103  CO2 22 21*  GLUCOSE 117* 97  BUN 46* 43*  CREATININE 1.50* 1.46*  CALCIUM 9.2 8.9  PROT 6.5  --   ALBUMIN 3.6  --   AST 31  --   ALT 11  --   ALKPHOS 106  --   BILITOT 0.5  --   GFRNONAA 33* 34*  ANIONGAP 10 10     Hematology Recent Labs  Lab 07/08/21 0930 07/09/21 0038  WBC 11.0* 10.1  RBC 2.97* 2.77*  HGB 9.6* 9.1*  HCT 29.7* 28.2*  MCV 100.0 101.8*  MCH 32.3 32.9  MCHC 32.3 32.3  RDW 14.0 13.6  PLT 225 225    Cardiac EnzymesNo results for input(s): TROPONINI in the last 168 hours. No results for input(s): TROPIPOC in the last 168 hours.   BNPNo results for input(s): BNP, PROBNP in the last 168 hours.   DDimer No results for input(s): DDIMER in the last 168 hours.   Radiology    DG Chest 2 View  Result Date: 07/08/2021 CLINICAL DATA:  Chest pain EXAM: CHEST -  2 VIEW COMPARISON:  05/10/2012 FINDINGS: Heart is normal in size. Status post median sternotomy and CABG. Both lungs are clear. Disc degenerative disease thoracic spine. IMPRESSION: No acute abnormality of the lungs. Electronically Signed   By: Eddie Candle M.D.   On: 07/08/2021 10:26    Cardiac Studies   ECHO:  Pending  Patient Profile     87 y.o. female with a history of CABG.  She presented with chest pain and elevated troponin.   Assessment & Plan    NSTEMI:  Will start Plavix.  Continue ASA and heparin x another 24 hours.   Continue metoprolol.  Holding on ARB with increased creat.  Echo pending.  Ambulate.  She absolutely wants conservative management and I respect that.  ANEMIA:  Chronic.  No overt bleeding.  Follow CBC in the AM again and I will send a stool guaiac.    For questions or updates, please contact [place] Please consult [email or web address] for contact info under Cardiology/STEMI.   Signed, Minus Breeding, MD  07/09/2021, 7:34 AM

## 2021-07-09 NOTE — Progress Notes (Signed)
  Echocardiogram 2D Echocardiogram has been performed.  Matilde Bash 07/09/2021, 3:17 PM

## 2021-07-09 NOTE — TOC Progression Note (Signed)
Transition of Care [facility]) - Progression Note    Patient Details  Name: MARQUESHA ROBIDEAU MRN: [id number] Date of Birth: 31-Aug-1927  [facility]) CM/SW Contact  Zenon Mayo, RN Phone Number: 07/09/2021, 4:01 PM  Clinical Narrative:    Patient is from [facility].          Expected Discharge Plan and Services                                                 Social Determinants of Health (SDOH) Interventions    Readmission Risk Interventions No flowsheet data found.

## 2021-07-09 NOTE — Progress Notes (Signed)
   07/09/21 1516  Mobility  Activity Refused mobility (Patient declined stating she ambulated already and just got back in bed)  Mobility Response RN notified

## 2021-07-10 LAB — CBC
HCT: 29.7 % — ABNORMAL LOW (ref 36.0–46.0)
Hemoglobin: 9.6 g/dL — ABNORMAL LOW (ref 12.0–15.0)
MCH: 33 pg (ref 26.0–34.0)
MCHC: 32.3 g/dL (ref 30.0–36.0)
MCV: 102.1 fL — ABNORMAL HIGH (ref 80.0–100.0)
Platelets: 199 10*3/uL (ref 150–400)
RBC: 2.91 MIL/uL — ABNORMAL LOW (ref 3.87–5.11)
RDW: 13.7 % (ref 11.5–15.5)
WBC: 7.5 10*3/uL (ref 4.0–10.5)
nRBC: 0 % (ref 0.0–0.2)

## 2021-07-10 LAB — HEPARIN LEVEL (UNFRACTIONATED): Heparin Unfractionated: 0.38 IU/mL (ref 0.30–0.70)

## 2021-07-10 MED ORDER — DIPHENHYDRAMINE HCL 25 MG PO CAPS
25.0000 mg | ORAL_CAPSULE | Freq: Every day | ORAL | Status: DC | PRN
  Administered 2021-07-10 – 2021-07-11 (×2): 25 mg via ORAL
  Filled 2021-07-10 (×2): qty 1

## 2021-07-10 NOTE — Plan of Care (Signed)

## 2021-07-10 NOTE — Progress Notes (Signed)
Progress Note  Patient Name: Kathryn Carlson Date of Encounter: 07/10/2021  Primary Cardiologist:   Quay Burow, MD   Subjective   No chest pain.  No SOB.   Inpatient Medications    Scheduled Meds:  chlordiazePOXIDE  25 mg Oral Daily   And   amitriptyline  12.5 mg Oral Daily   aspirin EC  81 mg Oral Daily   atorvastatin  40 mg Oral Daily   cholecalciferol  2,000 Units Oral Daily   clopidogrel  75 mg Oral Daily   estradiol  1 mg Oral Daily   isosorbide mononitrate  30 mg Oral Daily   levothyroxine  75 mcg Oral QAC breakfast   metoprolol succinate  100 mg Oral Daily   multivitamin with minerals  1 tablet Oral Daily   niacin  500 mg Oral Q breakfast   pantoprazole  40 mg Oral Daily   timolol  1 drop Right Eye q morning   Vitamin E  400 mg Oral Daily   Continuous Infusions:  heparin 700 Units/hr (07/08/21 2043)   PRN Meds: acetaminophen, ALPRAZolam, nitroGLYCERIN, ondansetron (ZOFRAN) IV   Vital Signs    Vitals:   07/10/21 0006 07/10/21 0256 07/10/21 0349 07/10/21 0733  BP: (!) 109/49  (!) 95/40 (!) 118/46  Pulse: 69  69 67  Resp: 19  19 19   Temp: 97.6 F (36.4 C)  97.6 F (36.4 C) (!) 97.5 F (36.4 C)  TempSrc: Oral  Oral Oral  SpO2: 97%  98% 99%  Weight:  72.3 kg    Height:        Intake/Output Summary (Last 24 hours) at 07/10/2021 0811 Last data filed at 07/10/2021 0400 Gross per 24 hour  Intake 1105.3 ml  Output 1050 ml  Net 55.3 ml   Filed Weights   07/08/21 0923 07/09/21 0024 07/10/21 0256  Weight: 73 kg 73.2 kg 72.3 kg    Telemetry    NSR - Personally Reviewed  ECG    NSR with first degree AV block and T wave inversion in the inferior leads.  No change from previous.  - Personally Reviewed  Physical Exam   GENERAL:  Well appearing NECK:  No jugular venous distention, waveform within normal limits, carotid upstroke brisk and symmetric, no bruits, no thyromegaly LUNGS:  Clear to auscultation bilaterally CHEST:   Unremarkable HEART:  PMI not displaced or sustained,S1 and S2 within normal limits, no S3, no S4, no clicks, no rubs, 3/6 apical systolic murmur, no diastolic murmurs ABD:  Flat, positive bowel sounds normal in frequency in pitch, no bruits, no rebound, no guarding, no midline pulsatile mass, no hepatomegaly, no splenomegaly EXT:  2 plus pulses throughout, no edema, no cyanosis no clubbing   Labs    Chemistry Recent Labs  Lab 07/08/21 0930 07/09/21 0038  NA 132* 134*  K 4.1 4.2  CL 100 103  CO2 22 21*  GLUCOSE 117* 97  BUN 46* 43*  CREATININE 1.50* 1.46*  CALCIUM 9.2 8.9  PROT 6.5  --   ALBUMIN 3.6  --   AST 31  --   ALT 11  --   ALKPHOS 106  --   BILITOT 0.5  --   GFRNONAA 33* 34*  ANIONGAP 10 10     Hematology Recent Labs  Lab 07/08/21 0930 07/09/21 0038 07/10/21 0349  WBC 11.0* 10.1 7.5  RBC 2.97* 2.77* 2.91*  HGB 9.6* 9.1* 9.6*  HCT 29.7* 28.2* 29.7*  MCV 100.0 101.8* 102.1*  MCH  32.3 32.9 33.0  MCHC 32.3 32.3 32.3  RDW 14.0 13.6 13.7  PLT 225 225 199    Cardiac EnzymesNo results for input(s): TROPONINI in the last 168 hours. No results for input(s): TROPIPOC in the last 168 hours.   BNPNo results for input(s): BNP, PROBNP in the last 168 hours.   DDimer No results for input(s): DDIMER in the last 168 hours.   Radiology    DG Chest 2 View  Result Date: 07/08/2021 CLINICAL DATA:  Chest pain EXAM: CHEST - 2 VIEW COMPARISON:  05/10/2012 FINDINGS: Heart is normal in size. Status post median sternotomy and CABG. Both lungs are clear. Disc degenerative disease thoracic spine. IMPRESSION: No acute abnormality of the lungs. Electronically Signed   By: Eddie Candle M.D.   On: 07/08/2021 10:26   ECHOCARDIOGRAM COMPLETE  Result Date: 07/09/2021    ECHOCARDIOGRAM REPORT   Patient Name:   Kathryn Carlson Date of Exam: 07/09/2021 Medical Rec #:  [id number]         Height:       63.0 in Accession #:    [id number]        Weight:       161.3 lb Date of Birth:   [birth year]          BSA:          1.765 m Patient Age:    87 years          BP:           126/46 mmHg Patient Gender: F                 HR:           72 bpm. Exam Location:  Inpatient Procedure: 2D Echo, Cardiac Doppler and Color Doppler Indications:    Aortic stenosis  History:        Patient has prior history of Echocardiogram examinations, most                 recent 03/04/2020. CAD and Acute MI, Prior CABG, Mitral Valve                 Disease and Mild AS, Signs/Symptoms:Chest Pain; Risk                 Factors:Hypertension and Dyslipidemia.  Sonographer:    Dustin Flock Referring Phys: [id number] [place]  1. Left ventricular ejection fraction, by estimation, is 50 to 55%. The left ventricle has low normal function. The left ventricle demonstrates regional wall motion abnormalities with basal inferior and basal inferolateral akinesis. There is mild left ventricular hypertrophy. Left ventricular diastolic parameters are consistent with Grade II diastolic dysfunction (pseudonormalization).  2. Right ventricular systolic function is normal. The right ventricular size is normal. Tricuspid regurgitation signal is inadequate for assessing PA pressure.  3. The mitral valve is abnormal. Mild mitral valve regurgitation. No evidence of mitral stenosis. Moderate mitral annular calcification.  4. The aortic valve is tricuspid. Aortic valve regurgitation is mild. Moderate aortic valve stenosis (suspect paradoxical low flow/low gradient moderate AS). Aortic valve area, by VTI measures 1.01 cm. Aortic valve mean gradient measures 12.0 mmHg.  5. The IVC was not visualized. FINDINGS  Left Ventricle: Left ventricular ejection fraction, by estimation, is 50 to 55%. The left ventricle has low normal function. The left ventricle demonstrates regional wall motion abnormalities. The left ventricular internal cavity size was normal in size. There is mild left ventricular hypertrophy. Left ventricular diastolic  parameters are consistent with Grade II diastolic dysfunction (pseudonormalization). Right Ventricle: The right ventricular size is normal. No increase in right ventricular wall thickness. Right ventricular systolic function is normal. Tricuspid regurgitation signal is inadequate for assessing PA pressure. Left Atrium: Left atrial size was normal in size. Right Atrium: Right atrial size was normal in size. Pericardium: There is no evidence of pericardial effusion. Mitral Valve: The mitral valve is abnormal. There is mild calcification of the mitral valve leaflet(s). Moderate mitral annular calcification. Mild mitral valve regurgitation. No evidence of mitral valve stenosis. Tricuspid Valve: The tricuspid valve is normal in structure. Tricuspid valve regurgitation is not demonstrated. Aortic Valve: The aortic valve is tricuspid. Aortic valve regurgitation is mild. Aortic regurgitation PHT measures 368 msec. Moderate aortic stenosis is present. Aortic valve mean gradient measures 12.0 mmHg. Aortic valve peak gradient measures 19.6 mmHg. Aortic valve area, by VTI measures 1.01 cm. Pulmonic Valve: The pulmonic valve was not well visualized. Pulmonic valve regurgitation is not visualized. Aorta: The aortic root is normal in size and structure. Venous: The inferior vena cava was not well visualized. IAS/Shunts: No atrial level shunt detected by color flow Doppler.  LEFT VENTRICLE PLAX 2D LVIDd:         4.70 cm  Diastology LVIDs:         3.50 cm  LV e' medial:    5.22 cm/s LV PW:         1.20 cm  LV E/e' medial:  16.5 LV IVS:        1.40 cm  LV e' lateral:   8.70 cm/s LVOT diam:     2.00 cm  LV E/e' lateral: 9.9 LV SV:         52 LV SV Index:   29 LVOT Area:     3.14 cm  RIGHT VENTRICLE RV Basal diam:  2.50 cm RV S prime:     5.00 cm/s TAPSE (M-mode): 2.0 cm LEFT ATRIUM             Index       RIGHT ATRIUM           Index LA diam:        4.10 cm 2.32 cm/m  RA Area:     10.80 cm LA Vol (A2C):   27.5 ml 15.58 ml/m RA  Volume:   24.60 ml  13.94 ml/m LA Vol (A4C):   37.0 ml 20.97 ml/m LA Biplane Vol: 32.3 ml 18.30 ml/m  AORTIC VALVE AV Area (Vmax):    1.06 cm AV Area (Vmean):   0.95 cm AV Area (VTI):     1.01 cm AV Vmax:           221.50 cm/s AV Vmean:          165.000 cm/s AV VTI:            0.513 m AV Peak Grad:      19.6 mmHg AV Mean Grad:      12.0 mmHg LVOT Vmax:         74.40 cm/s LVOT Vmean:        50.100 cm/s LVOT VTI:          0.165 m LVOT/AV VTI ratio: 0.32 AI PHT:            368 msec  AORTA Ao Root diam: 2.90 cm MITRAL VALVE MV Area (PHT): 3.77 cm    SHUNTS MV Decel Time: 201 msec    Systemic VTI:  0.16 m  MV E velocity: 86.10 cm/s  Systemic Diam: 2.00 cm MV A velocity: 71.60 cm/s MV E/A ratio:  1.20 Loralie Champagne MD Electronically signed by Loralie Champagne MD Signature Date/Time: 07/09/2021/4:51:40 PM    Final     Cardiac Studies   ECHO:    1. Left ventricular ejection fraction, by estimation, is 50 to 55%. The left ventricle has low normal function. The left ventricle demonstrates regional wall motion abnormalities with basal inferior and basal inferolateral akinesis. There is mild left ventricular hypertrophy. Left ventricular diastolic parameters are consistent with Grade II diastolic dysfunction (pseudonormalization). 2. Right ventricular systolic function is normal. The right ventricular size is normal. Tricuspid regurgitation signal is inadequate for assessing PA pressure. 3. The mitral valve is abnormal. Mild mitral valve regurgitation. No evidence of mitral stenosis. Moderate mitral annular calcification. 4. The aortic valve is tricuspid. Aortic valve regurgitation is mild. Moderate aortic valve stenosis (suspect paradoxical low flow/low gradient moderate AS). Aortic valve area, by VTI measures 1.01 cm. Aortic valve mean gradient measures 12.0 mmHg. 5. The IVC was not visualized.  Patient Profile     87 y.o. female with a history of CABG.  She presented with chest pain and elevated  troponin.   Assessment & Plan    NSTEMI:    Stop heparin today.  Continue DOAC.  EF low normal.  Patient wants conservative therapy and this is very reasonable.    Ambulated today and home in AM if no further symptoms.   ANEMIA:  Chronic. Stable  No overt bleeding.  She can have this followed by PCP as an out patient.  No stool for guaiac.    AS:   Mild on echo.  No change in therapy.     For questions or updates, please contact [place] Please consult [email or web address] for contact info under Cardiology/STEMI.   Signed, Minus Breeding, MD  07/10/2021, 8:11 AM

## 2021-07-10 NOTE — Progress Notes (Signed)
   07/10/21 1347  Mobility  Activity Refused mobility (Pt asleep uon entry, declined mobility at this time. Will check back)

## 2021-07-10 NOTE — TOC Progression Note (Signed)
Transition of Care [facility]) - Progression Note    Patient Details  Name: NECIA KAMM MRN: [id number] Date of Birth: 1927-12-10  Transition of Care [facility]) CM/SW Contact  Reece Agar, [place] Phone Number: 07/10/2021, 9:51 AM  Clinical Narrative:    Karlene Einstein from [employer] contacted CSW to provide DC nurse contact information bc she Karlene Einstein) will be out of the office tomorrow. The number is [phone] ex. [phone] and the nurse is Tywan. If there are any changes today with pt CSW can contact Ivy at [phone].        Expected Discharge Plan and Services                                                 Social Determinants of Health (SDOH) Interventions    Readmission Risk Interventions No flowsheet data found.

## 2021-07-11 MED ORDER — CLOPIDOGREL BISULFATE 75 MG PO TABS: 75.0000 mg | ORAL_TABLET | Freq: Every day | ORAL | 2 refills | Status: DC

## 2021-07-11 MED ORDER — NITROGLYCERIN 0.4 MG SL SUBL: 0.4000 mg | SUBLINGUAL_TABLET | SUBLINGUAL | 0 refills | Status: DC | PRN

## 2021-07-11 NOTE — Progress Notes (Signed)
Progress Note  Patient Name: Kathryn Carlson Date of Encounter: 07/11/2021  Primary Cardiologist:   Quay Burow, MD   Subjective   No further back pain.  Itching again this morning.   Inpatient Medications    Scheduled Meds:  chlordiazePOXIDE  25 mg Oral Daily   And   amitriptyline  12.5 mg Oral Daily   aspirin EC  81 mg Oral Daily   atorvastatin  40 mg Oral Daily   cholecalciferol  2,000 Units Oral Daily   clopidogrel  75 mg Oral Daily   estradiol  1 mg Oral Daily   isosorbide mononitrate  30 mg Oral Daily   levothyroxine  75 mcg Oral QAC breakfast   metoprolol succinate  100 mg Oral Daily   multivitamin with minerals  1 tablet Oral Daily   niacin  500 mg Oral Q breakfast   pantoprazole  40 mg Oral Daily   timolol  1 drop Right Eye q morning   Vitamin E  400 mg Oral Daily   Continuous Infusions:   PRN Meds: acetaminophen, ALPRAZolam, diphenhydrAMINE, nitroGLYCERIN, ondansetron (ZOFRAN) IV   Vital Signs    Vitals:   07/10/21 1150 07/10/21 1543 07/10/21 1922 07/11/21 0420  BP: (!) 109/54 (!) 112/45 (!) 121/50 (!) 99/43  Pulse: 66 65 73 70  Resp: 18 18 20 18   Temp: (!) 97.4 F (36.3 C) 97.8 F (36.6 C) 97.6 F (36.4 C) (!) 97.5 F (36.4 C)  TempSrc: Oral Oral Oral Oral  SpO2: 100% 97% 98% 100%  Weight:    73 kg  Height:        Intake/Output Summary (Last 24 hours) at 07/11/2021 0759 Last data filed at 07/11/2021 0425 Gross per 24 hour  Intake 1200 ml  Output 350 ml  Net 850 ml   Filed Weights   07/09/21 0024 07/10/21 0256 07/11/21 0420  Weight: 73.2 kg 72.3 kg 73 kg    Telemetry    NSR- Personally Reviewed  ECG    NA  - Personally Reviewed  Physical Exam   GEN: No  acute distress.   Neck: No  JVD Cardiac: RRR, 3/6 apical systolic murmur, no diastolic murmurs, rubs, or gallops.  Respiratory: Clear   to auscultation bilaterally. GI: Soft, nontender, non-distended, normal bowel sounds  MS:  No edema; No deformity. Neuro:   Nonfocal   Psych: Oriented and appropriate    Labs    Chemistry Recent Labs  Lab 07/08/21 0930 07/09/21 0038  NA 132* 134*  K 4.1 4.2  CL 100 103  CO2 22 21*  GLUCOSE 117* 97  BUN 46* 43*  CREATININE 1.50* 1.46*  CALCIUM 9.2 8.9  PROT 6.5  --   ALBUMIN 3.6  --   AST 31  --   ALT 11  --   ALKPHOS 106  --   BILITOT 0.5  --   GFRNONAA 33* 34*  ANIONGAP 10 10     Hematology Recent Labs  Lab 07/08/21 0930 07/09/21 0038 07/10/21 0349  WBC 11.0* 10.1 7.5  RBC 2.97* 2.77* 2.91*  HGB 9.6* 9.1* 9.6*  HCT 29.7* 28.2* 29.7*  MCV 100.0 101.8* 102.1*  MCH 32.3 32.9 33.0  MCHC 32.3 32.3 32.3  RDW 14.0 13.6 13.7  PLT 225 225 199    Cardiac EnzymesNo results for input(s): TROPONINI in the last 168 hours. No results for input(s): TROPIPOC in the last 168 hours.   BNPNo results for input(s): BNP, PROBNP in the last 168 hours.  DDimer No results for input(s): DDIMER in the last 168 hours.   Radiology    ECHOCARDIOGRAM COMPLETE  Result Date: 07/09/2021    ECHOCARDIOGRAM REPORT   Patient Name:   Kathryn Carlson Date of Exam: 07/09/2021 Medical Rec #:  [id number]         Height:       63.0 in Accession #:    [id number]        Weight:       161.3 lb Date of Birth:  28-Sep-1927          BSA:          1.765 m Patient Age:    87 years          BP:           126/46 mmHg Patient Gender: F                 HR:           72 bpm. Exam Location:  Inpatient Procedure: 2D Echo, Cardiac Doppler and Color Doppler Indications:    Aortic stenosis  History:        Patient has prior history of Echocardiogram examinations, most                 recent 03/04/2020. CAD and Acute MI, Prior CABG, Mitral Valve                 Disease and Mild AS, Signs/Symptoms:Chest Pain; Risk                 Factors:Hypertension and Dyslipidemia.  Sonographer:    Dustin Flock Referring Phys: [id number] [place]  1. Left ventricular ejection fraction, by estimation, is 50 to 55%. The left ventricle has low normal  function. The left ventricle demonstrates regional wall motion abnormalities with basal inferior and basal inferolateral akinesis. There is mild left ventricular hypertrophy. Left ventricular diastolic parameters are consistent with Grade II diastolic dysfunction (pseudonormalization).  2. Right ventricular systolic function is normal. The right ventricular size is normal. Tricuspid regurgitation signal is inadequate for assessing PA pressure.  3. The mitral valve is abnormal. Mild mitral valve regurgitation. No evidence of mitral stenosis. Moderate mitral annular calcification.  4. The aortic valve is tricuspid. Aortic valve regurgitation is mild. Moderate aortic valve stenosis (suspect paradoxical low flow/low gradient moderate AS). Aortic valve area, by VTI measures 1.01 cm. Aortic valve mean gradient measures 12.0 mmHg.  5. The IVC was not visualized. FINDINGS  Left Ventricle: Left ventricular ejection fraction, by estimation, is 50 to 55%. The left ventricle has low normal function. The left ventricle demonstrates regional wall motion abnormalities. The left ventricular internal cavity size was normal in size. There is mild left ventricular hypertrophy. Left ventricular diastolic parameters are consistent with Grade II diastolic dysfunction (pseudonormalization). Right Ventricle: The right ventricular size is normal. No increase in right ventricular wall thickness. Right ventricular systolic function is normal. Tricuspid regurgitation signal is inadequate for assessing PA pressure. Left Atrium: Left atrial size was normal in size. Right Atrium: Right atrial size was normal in size. Pericardium: There is no evidence of pericardial effusion. Mitral Valve: The mitral valve is abnormal. There is mild calcification of the mitral valve leaflet(s). Moderate mitral annular calcification. Mild mitral valve regurgitation. No evidence of mitral valve stenosis. Tricuspid Valve: The tricuspid valve is normal in structure.  Tricuspid valve regurgitation is not demonstrated. Aortic Valve: The aortic valve is tricuspid. Aortic valve regurgitation is mild.  Aortic regurgitation PHT measures 368 msec. Moderate aortic stenosis is present. Aortic valve mean gradient measures 12.0 mmHg. Aortic valve peak gradient measures 19.6 mmHg. Aortic valve area, by VTI measures 1.01 cm. Pulmonic Valve: The pulmonic valve was not well visualized. Pulmonic valve regurgitation is not visualized. Aorta: The aortic root is normal in size and structure. Venous: The inferior vena cava was not well visualized. IAS/Shunts: No atrial level shunt detected by color flow Doppler.  LEFT VENTRICLE PLAX 2D LVIDd:         4.70 cm  Diastology LVIDs:         3.50 cm  LV e' medial:    5.22 cm/s LV PW:         1.20 cm  LV E/e' medial:  16.5 LV IVS:        1.40 cm  LV e' lateral:   8.70 cm/s LVOT diam:     2.00 cm  LV E/e' lateral: 9.9 LV SV:         52 LV SV Index:   29 LVOT Area:     3.14 cm  RIGHT VENTRICLE RV Basal diam:  2.50 cm RV S prime:     5.00 cm/s TAPSE (M-mode): 2.0 cm LEFT ATRIUM             Index       RIGHT ATRIUM           Index LA diam:        4.10 cm 2.32 cm/m  RA Area:     10.80 cm LA Vol (A2C):   27.5 ml 15.58 ml/m RA Volume:   24.60 ml  13.94 ml/m LA Vol (A4C):   37.0 ml 20.97 ml/m LA Biplane Vol: 32.3 ml 18.30 ml/m  AORTIC VALVE AV Area (Vmax):    1.06 cm AV Area (Vmean):   0.95 cm AV Area (VTI):     1.01 cm AV Vmax:           221.50 cm/s AV Vmean:          165.000 cm/s AV VTI:            0.513 m AV Peak Grad:      19.6 mmHg AV Mean Grad:      12.0 mmHg LVOT Vmax:         74.40 cm/s LVOT Vmean:        50.100 cm/s LVOT VTI:          0.165 m LVOT/AV VTI ratio: 0.32 AI PHT:            368 msec  AORTA Ao Root diam: 2.90 cm MITRAL VALVE MV Area (PHT): 3.77 cm    SHUNTS MV Decel Time: 201 msec    Systemic VTI:  0.16 m MV E velocity: 86.10 cm/s  Systemic Diam: 2.00 cm MV A velocity: 71.60 cm/s MV E/A ratio:  1.20 Loralie Champagne MD Electronically  signed by Loralie Champagne MD Signature Date/Time: 07/09/2021/4:51:40 PM    Final     Cardiac Studies   ECHO:    1. Left ventricular ejection fraction, by estimation, is 50 to 55%. The left ventricle has low normal function. The left ventricle demonstrates regional wall motion abnormalities with basal inferior and basal inferolateral akinesis. There is mild left ventricular hypertrophy. Left ventricular diastolic parameters are consistent with Grade II diastolic dysfunction (pseudonormalization). 2. Right ventricular systolic function is normal. The right ventricular size is normal. Tricuspid regurgitation signal is inadequate for assessing PA pressure.  3. The mitral valve is abnormal. Mild mitral valve regurgitation. No evidence of mitral stenosis. Moderate mitral annular calcification. 4. The aortic valve is tricuspid. Aortic valve regurgitation is mild. Moderate aortic valve stenosis (suspect paradoxical low flow/low gradient moderate AS). Aortic valve area, by VTI measures 1.01 cm. Aortic valve mean gradient measures 12.0 mmHg. 5. The IVC was not visualized.  Patient Profile     87 y.o. female with a history of CABG.  She presented with chest pain and elevated troponin.   Assessment & Plan    NSTEMI:     No pain.  Medical management per previous discussions.   She is itching but no rash.  Could be Plavix but also it happens after synthroid.  She is going to go home and if the itching continues to happen we will have to stop the Plavix.  Discussed with the patient and her daughters.   Home after ambulating.  BP will not allow other med titration.   ANEMIA:  Chronic. Stable  Follow up as an out patient with PCP.    AS:   Mild on echo.    For questions or updates, please contact [place] Please consult [email or web address] for contact info under Cardiology/STEMI.   Signed, Minus Breeding, MD  07/11/2021, 7:59 AM

## 2021-07-11 NOTE — TOC Progression Note (Addendum)
Transition of Care [facility]) - Progression Note    Patient Details  Name: Kathryn Carlson MRN: [id number] Date of Birth: [birth year]  Transition of Care [facility]) CM/SW Contact  Reece Agar, [place] Phone Number: 07/11/2021, 1:04 PM  Clinical Narrative:    [id number]: CSW attempted to contact [place] at [facility] for pt DC as directed by facility admin, no answer CSW left a detailed VM and will continue to assisting with pt DC.   Expected Discharge Plan:  (Independent Living at [facility]) Barriers to Discharge: Barriers Resolved  Expected Discharge Plan and Services Expected Discharge Plan:  (Independent Living at [facility]) In-house Referral: Clinical Social Work Discharge Planning Services: NA Post Acute Care Choice: NA (IDL) Living arrangements for the past 2 months: [place] Expected Discharge Date: 07/11/21                                     Social Determinants of Health (SDOH) Interventions    Readmission Risk Interventions No flowsheet data found.

## 2021-07-11 NOTE — Progress Notes (Signed)
Patient's daughters are at the bedside and went over discharge instructions with both daughters and patient. Patient daughters made aware of medication changes and follow up appointments. PIV and tele monitor removed. CCMD has been notified. PTAR has been called. Will continue to monitor.

## 2021-07-11 NOTE — TOC Initial Note (Addendum)
Transition of Care [facility]) - Initial/Assessment Note    Patient Details  Name: Kathryn Carlson MRN: [id number] Date of Birth: [birth year]  Transition [facility]) CM/SW Contact:    Kathryn Carlson Phone Number: 07/11/2021, 12:52 PM  Clinical Narrative:                 Pt is from [place]. Kathryn Carlson is the contact person for [facility] IDL her contact number is [phone]. Pt supports are her daughters who are listed in the chart, they are very supportive and engaged in pt care. CSW will continue to follow to for DC planning needs.  Expected Discharge Plan:  (Independent Living at [facility]) Barriers to Discharge: Barriers Resolved   Patient Goals and CMS Choice Patient states their goals for this hospitalization and ongoing recovery are:: Return Home CMS Medicare.gov Compare Post Acute Care list provided to:: Patient Choice offered to / list presented to : Patient  Expected Discharge Plan and Services Expected Discharge Plan:  (Independent Living at [facility]) In-house Referral: Clinical Social Work Discharge Planning Services: NA Post Acute Care Choice: NA (IDL) Living arrangements for the past 2 months: [place] Expected Discharge Date: 07/11/21                                    Prior Living Arrangements/Services Living arrangements for the past 2 months: [place] Lives with:: Self Patient language and need for interpreter reviewed:: Yes Do you feel safe going back to the place where you live?: Yes      Need for Family Participation in Patient Care: No (Comment) Care giver support system in place?: Yes (comment) (Both daughters listed in chart)   Criminal Activity/Legal Involvement Pertinent to Current Situation/Hospitalization: No - Comment as needed  Activities of Daily Living Home Assistive Devices/Equipment: Cane (specify quad or straight) ADL Screening (condition at time of  admission) Patient's cognitive ability adequate to safely complete daily activities?: Yes Is the patient deaf or have difficulty hearing?: Yes Does the patient have difficulty seeing, even when wearing glasses/contacts?: No Does the patient have difficulty concentrating, remembering, or making decisions?: No Patient able to express need for assistance with ADLs?: Yes Does the patient have difficulty dressing or bathing?: Yes Independently performs ADLs?: Yes (appropriate for developmental age) Does the patient have difficulty walking or climbing stairs?: Yes Weakness of Legs: Both Weakness of Arms/Hands: None  Permission Sought/Granted Permission sought to share information with : Family Supports Permission granted to share information with : Yes, Verbal Permission Granted  Share Information with NAME: Kathryn Carlson,Kathryn Carlson     Permission granted to share info w Relationship: Daughter  Permission granted to share info w Contact Information: [phone]  Emotional Assessment Appearance:: Appears stated age   Affect (typically observed): Unable to Assess Orientation: : Oriented to Self, Oriented to Place, Oriented to  Time, Oriented to Situation Alcohol / Substance Use: Not Applicable Psych Involvement: No (comment)  Admission diagnosis:  NSTEMI (non-ST elevated myocardial infarction) [facility]) [I21.4] Patient Active Problem List   Diagnosis Date Noted   NSTEMI (non-ST elevated myocardial infarction) ([place]) 07/08/2021   Insomnia 05/18/2018   Fatigue 11/30/2017   Obesity (BMI 30.0-34.9) 11/30/2017   DOE (dyspnea on exertion) 11/12/2017   Aortic stenosis, mild 02/05/2017   Frequent PVCs 06/04/2014   NSVT (nonsustained ventricular tachycardia) ([place]) 12/04/2013   HTN (hypertension) 10/25/2013   Dyslipidemia  10/25/2013   S/P CABG x 3 10/25/2013   Symptomatic bradycardia 10/25/2013   PCP:  Burnard Bunting, MD Pharmacy:   [id number] [address], [place] [address] Phone: [phone] Fax: [phone]     Social Determinants of Health (SDOH) Interventions    Readmission Risk Interventions No flowsheet data found.

## 2021-07-11 NOTE — Progress Notes (Signed)
  Mobility Specialist Criteria Algorithm Info.  Mobility Team: HOB elevated:HOB 30 Activity: Ambulated in hall; Dangled on edge of bed Range of motion: Active; All extremities Level of assistance: Contact guard assist, steadying assist Assistive device: Cane Minutes sitting in chair:  Minutes stood: 5 minutes Minutes ambulated: 5 minutes Distance ambulated (ft): 200 ft Mobility response: Tolerated well (LOB x1) Bed Position: Semi-fowlers  Patient received lying supine in bed eager to participate in mobility with anticipation to be discharged. Daughters at the bedside reported pt being independent with ADL's and ambulates regularly with Rolator. Patient has large hematoma on upper right region of back but daughters denied fall as a cause. She got to the EOB supine>sit independently and stood with minimal assist + cues for hand placement. Ambulated in hallway 200 feet with slow steady gait using cane. LOB x1 while turning head requiring minimal guard/contact to gather balance. Tolerated ambulation well without incident or complaint and was left dangling EOB with all needs met.   07/11/2021 9:59 AM

## 2021-07-11 NOTE — Care Management Important Message (Signed)
Important Message  Patient Details  Name: Kathryn Carlson MRN: [id number] Date of Birth: 1927-11-22   Medicare Important Message Given:  Yes     Shelda Altes 07/11/2021, 9:59 AM

## 2021-07-11 NOTE — Discharge Summary (Signed)
Discharge Summary    Patient ID: Kathryn Carlson MRN: [id number]; DOB: 1927/04/08  Admit date: 07/08/2021 Discharge date: 07/11/2021  PCP:  Burnard Bunting, MD   [facility] Providers Cardiologist:  Quay Burow, MD   {    Discharge Diagnoses    Principal Problem:   NSTEMI (non-ST elevated myocardial infarction) [facility]) Active Problems:   HTN (hypertension)   Dyslipidemia   S/P CABG x 3   Aortic stenosis, mild  Diagnostic Studies/Procedures    Echocardiogram 07/09/21:  1. Left ventricular ejection fraction, by estimation, is 50 to 55%. The left ventricle has low normal function. The left ventricle demonstrates regional wall motion abnormalities with basal inferior and basal inferolateral akinesis. There is mild left ventricular hypertrophy. Left ventricular diastolic parameters are consistent with Grade II diastolic dysfunction (pseudonormalization). 2. Right ventricular systolic function is normal. The right ventricular size is normal. Tricuspid regurgitation signal is inadequate for assessing PA pressure. 3. The mitral valve is abnormal. Mild mitral valve regurgitation. No evidence of mitral stenosis. Moderate mitral annular calcification. 4. The aortic valve is tricuspid. Aortic valve regurgitation is mild. Moderate aortic valve stenosis (suspect paradoxical low flow/low gradient moderate AS). Aortic valve area, by VTI measures 1.01 cm. Aortic valve mean gradient measures 12.0 mmHg. 5. The IVC was not visualized. _____________   History of Present Illness     Kathryn Carlson is a 87 y.o. female with hx of CAD s/p remote CABG in 1997, PVCs, hypertension, hyperlipidemia, hypothyroidism, GERD, and skin cancer who was admitted 07/08/2021 to [facility] for the evaluation of NSTEMI.    She has a history of MI at the age of 59 and underwent CABG in 1997 by Dr. Nils Pyle. Her last ischemic work-up was a Myoview in 2013 which showed no ischemia. Echo in 02/2020 showed LVEF of  50-55% with normal wall motion, grade 2 diastolic dysfunction, moderate AI, mild AS, and mildly elevated PASP  Kathryn Carlson was in usual state of health up until Sunday night when had pain between her shoulder blades.  Her episode lasted for few hours.  She also has some burning sensation and felt like "acid reflux".  This was associated with nausea but no vomiting. She felt weak. There was no shortness of breath or palpitation. Symptoms improved but recurred intermittently one day prior to presentation. She lives at [facility] in independent facility.  She was seen by nurse and advised to come to ER.  She was found to have elevated troponin at 4604>>4756.  Started on IV heparin and transferred to [facility] for further evaluation.    Hospital Course     Plan discussed between care team, patient and family to not pursue cardiac catheterization and focus on medical management for her NSTEMI. Plan was to was to continue IV Heparin for 48H. Her HCTZ and ARB were held due to mild AKI with an admission creatinine at 1.5. She was started on Plavix and her metoprolol was continued. Echocardiogram this admission with LVEF at 50-55% with basal inferior and basal inferolateral akinesis, mild left ventricular hypertrophy, G2DD and moderate AS with a VTI at 1.01 and mean gradient at 15mmHg. Heparin was discontinued on 6/56/81 without complication. She had no recurrent chest pain during her hospital course. She developed itching (no rash) felt to possibly be secondary to Plavix however plan was to continue for now and if worsening, then may require discontinuation. Her anemia is chronic and remained stable after IV Heparin and the addition on Plavix and ASA.  Hb on day of d/c was 9.6 with no overt signs of bleeding. HsT peraked at 4756>>>down trended to 3864 by 07/09/21. Creatinine remained above baseline at 1.46. will need to recheck at follow up. We will continue to hold home HCTZ and Benicar. Follow AS with  repeat imagining however no c/o of dizziness or syncope. No SOB.   Prior to d/c she ambulated with care team with rollator without difficulty. She will be discharged back to [facility] facility.   Consultants: None    The patient was seen and examined by Dr. Percival Spanish who feels that she is stable and ready for discharge today, 07/10/21. Follow up made with Dr. [name] for 08/05/21.   Did the patient have an acute coronary syndrome (MI, NSTEMI, STEMI, etc) this admission?:  Yes                               AHA/ACC Clinical Performance & Quality Measures: Aspirin prescribed? - Yes ADP Receptor Inhibitor (Plavix/Clopidogrel, Brilinta/Ticagrelor or Effient/Prasugrel) prescribed (includes medically managed patients)? - Yes Beta Blocker prescribed? - Yes High Intensity Statin (Lipitor 40-80mg  or Crestor 20-40mg ) prescribed? - Yes EF assessed during THIS hospitalization? - Yes For EF <40%, was ACEI/ARB prescribed? - Not Applicable (EF >/= 23%) For EF <40%, Aldosterone Antagonist (Spironolactone or Eplerenone) prescribed? - Not Applicable (EF >/= 55%) Cardiac Rehab Phase II ordered (including medically managed patients)? - No - medical management only, patient refused      _____________  Discharge Vitals Blood pressure (!) 99/52, pulse 69, temperature (!) 97.5 F (36.4 C), temperature source Oral, resp. rate 16, height 5\' 3"  (1.6 m), weight 73 kg, SpO2 100 %.  Filed Weights   07/09/21 0024 07/10/21 0256 07/11/21 0420  Weight: 73.2 kg 72.3 kg 73 kg   Labs & Radiologic Studies    CBC Recent Labs    07/09/21 0038 07/10/21 0349  WBC 10.1 7.5  HGB 9.1* 9.6*  HCT 28.2* 29.7*  MCV 101.8* 102.1*  PLT 225 732   Basic Metabolic Panel Recent Labs    07/09/21 0038  NA 134*  K 4.2  CL 103  CO2 21*  GLUCOSE 97  BUN 43*  CREATININE 1.46*  CALCIUM 8.9   Liver Function Tests No results for input(s): AST, ALT, ALKPHOS, BILITOT, PROT, ALBUMIN in the last 72 hours. No results for  input(s): LIPASE, AMYLASE in the last 72 hours. High Sensitivity Troponin:   Recent Labs  Lab 07/08/21 0930 07/08/21 1146 07/08/21 1953 07/09/21 0038  TROPONINIHS 4,604* 4,756* 3,258* 3,864*    BNP Invalid input(s): POCBNP D-Dimer No results for input(s): DDIMER in the last 72 hours. Hemoglobin A1C Recent Labs    07/08/21 1953  HGBA1C 6.3*   Fasting Lipid Panel Recent Labs    07/09/21 0038  CHOL 119  HDL 40*  LDLCALC 60  TRIG 97  CHOLHDL 3.0   Thyroid Function Tests Recent Labs    07/08/21 1953  TSH 2.414   _____________  DG Chest 2 View  Result Date: 07/08/2021 CLINICAL DATA:  Chest pain EXAM: CHEST - 2 VIEW COMPARISON:  05/10/2012 FINDINGS: Heart is normal in size. Status post median sternotomy and CABG. Both lungs are clear. Disc degenerative disease thoracic spine. IMPRESSION: No acute abnormality of the lungs. Electronically Signed   By: Eddie Candle M.D.   On: 07/08/2021 10:26   ECHOCARDIOGRAM COMPLETE  Result Date: 07/09/2021    ECHOCARDIOGRAM REPORT   Patient  Name:   Kathryn Carlson Date of Exam: 07/09/2021 Medical Rec #:  [id number]         Height:       63.0 in Accession #:    [id number]        Weight:       161.3 lb Date of Birth:  February 03, 1927          BSA:          1.765 m Patient Age:    51 years          BP:           126/46 mmHg Patient Gender: F                 HR:           72 bpm. Exam Location:  Inpatient Procedure: 2D Echo, Cardiac Doppler and Color Doppler Indications:    Aortic stenosis  History:        Patient has prior history of Echocardiogram examinations, most                 recent 03/04/2020. CAD and Acute MI, Prior CABG, Mitral Valve                 Disease and Mild AS, Signs/Symptoms:Chest Pain; Risk                 Factors:Hypertension and Dyslipidemia.  Sonographer:    Dustin Flock Referring Phys: [id number] [place]  1. Left ventricular ejection fraction, by estimation, is 50 to 55%. The left ventricle has low normal  function. The left ventricle demonstrates regional wall motion abnormalities with basal inferior and basal inferolateral akinesis. There is mild left ventricular hypertrophy. Left ventricular diastolic parameters are consistent with Grade II diastolic dysfunction (pseudonormalization).  2. Right ventricular systolic function is normal. The right ventricular size is normal. Tricuspid regurgitation signal is inadequate for assessing PA pressure.  3. The mitral valve is abnormal. Mild mitral valve regurgitation. No evidence of mitral stenosis. Moderate mitral annular calcification.  4. The aortic valve is tricuspid. Aortic valve regurgitation is mild. Moderate aortic valve stenosis (suspect paradoxical low flow/low gradient moderate AS). Aortic valve area, by VTI measures 1.01 cm. Aortic valve mean gradient measures 12.0 mmHg.  5. The IVC was not visualized. FINDINGS  Left Ventricle: Left ventricular ejection fraction, by estimation, is 50 to 55%. The left ventricle has low normal function. The left ventricle demonstrates regional wall motion abnormalities. The left ventricular internal cavity size was normal in size. There is mild left ventricular hypertrophy. Left ventricular diastolic parameters are consistent with Grade II diastolic dysfunction (pseudonormalization). Right Ventricle: The right ventricular size is normal. No increase in right ventricular wall thickness. Right ventricular systolic function is normal. Tricuspid regurgitation signal is inadequate for assessing PA pressure. Left Atrium: Left atrial size was normal in size. Right Atrium: Right atrial size was normal in size. Pericardium: There is no evidence of pericardial effusion. Mitral Valve: The mitral valve is abnormal. There is mild calcification of the mitral valve leaflet(s). Moderate mitral annular calcification. Mild mitral valve regurgitation. No evidence of mitral valve stenosis. Tricuspid Valve: The tricuspid valve is normal in structure.  Tricuspid valve regurgitation is not demonstrated. Aortic Valve: The aortic valve is tricuspid. Aortic valve regurgitation is mild. Aortic regurgitation PHT measures 368 msec. Moderate aortic stenosis is present. Aortic valve mean gradient measures 12.0 mmHg. Aortic valve peak gradient measures 19.6 mmHg. Aortic valve area, by VTI measures  1.01 cm. Pulmonic Valve: The pulmonic valve was not well visualized. Pulmonic valve regurgitation is not visualized. Aorta: The aortic root is normal in size and structure. Venous: The inferior vena cava was not well visualized. IAS/Shunts: No atrial level shunt detected by color flow Doppler.  LEFT VENTRICLE PLAX 2D LVIDd:         4.70 cm  Diastology LVIDs:         3.50 cm  LV e' medial:    5.22 cm/s LV PW:         1.20 cm  LV E/e' medial:  16.5 LV IVS:        1.40 cm  LV e' lateral:   8.70 cm/s LVOT diam:     2.00 cm  LV E/e' lateral: 9.9 LV SV:         52 LV SV Index:   29 LVOT Area:     3.14 cm  RIGHT VENTRICLE RV Basal diam:  2.50 cm RV S prime:     5.00 cm/s TAPSE (M-mode): 2.0 cm LEFT ATRIUM             Index       RIGHT ATRIUM           Index LA diam:        4.10 cm 2.32 cm/m  RA Area:     10.80 cm LA Vol (A2C):   27.5 ml 15.58 ml/m RA Volume:   24.60 ml  13.94 ml/m LA Vol (A4C):   37.0 ml 20.97 ml/m LA Biplane Vol: 32.3 ml 18.30 ml/m  AORTIC VALVE AV Area (Vmax):    1.06 cm AV Area (Vmean):   0.95 cm AV Area (VTI):     1.01 cm AV Vmax:           221.50 cm/s AV Vmean:          165.000 cm/s AV VTI:            0.513 m AV Peak Grad:      19.6 mmHg AV Mean Grad:      12.0 mmHg LVOT Vmax:         74.40 cm/s LVOT Vmean:        50.100 cm/s LVOT VTI:          0.165 m LVOT/AV VTI ratio: 0.32 AI PHT:            368 msec  AORTA Ao Root diam: 2.90 cm MITRAL VALVE MV Area (PHT): 3.77 cm    SHUNTS MV Decel Time: 201 msec    Systemic VTI:  0.16 m MV E velocity: 86.10 cm/s  Systemic Diam: 2.00 cm MV A velocity: 71.60 cm/s MV E/A ratio:  1.20 Loralie Champagne MD Electronically  signed by Loralie Champagne MD Signature Date/Time: 07/09/2021/4:51:40 PM    Final    Disposition   Pt is being discharged home today in good condition.  Follow-up Plans & Appointments    Follow-up Information     Lorretta Harp, MD Follow up on 08/04/2021.   Specialties: Cardiology, Radiology Why: at 2:30pm Contact information: [address] [place] [address] [phone]                Discharge Instructions     Call MD for:  difficulty breathing, headache or visual disturbances   Complete by: As directed    Call MD for:  extreme fatigue   Complete by: As directed    Call MD for:  hives  Complete by: As directed    Call MD for:  persistant dizziness or light-headedness   Complete by: As directed    Call MD for:  persistant nausea and vomiting   Complete by: As directed    Call MD for:  redness, tenderness, or signs of infection (pain, swelling, redness, odor or green/yellow discharge around incision site)   Complete by: As directed    Call MD for:  severe uncontrolled pain   Complete by: As directed    Call MD for:  temperature >100.4   Complete by: As directed    Diet - low sodium heart healthy   Complete by: As directed    Increase activity slowly   Complete by: As directed       Discharge Medications   Allergies as of 07/11/2021       Reactions   Novocain [procaine]         Medication List     STOP taking these medications    hydrochlorothiazide 12.5 MG capsule Commonly known as: MICROZIDE   olmesartan 40 MG tablet Commonly known as: BENICAR       TAKE these medications    aspirin 81 MG tablet Take 81 mg by mouth daily.   atorvastatin 40 MG tablet Commonly known as: LIPITOR TAKE 1 TABLET BY MOUTH EVERY DAY   augmented betamethasone dipropionate 0.05 % cream Commonly known as: DIPROLENE-AF Apply topically 2 (two) times daily.   chloridazePOXIDE-amitriptyline 5-12.5 MG tablet Commonly known as: LIMBITROL Take 1  tablet by mouth daily.   cholecalciferol 1000 units tablet Commonly known as: VITAMIN D Take 2,000 Units by mouth daily.   clopidogrel 75 MG tablet Commonly known as: PLAVIX Take 1 tablet (75 mg total) by mouth daily. Start taking on: July 12, 2021   estradiol 1 MG tablet Commonly known as: ESTRACE Take 1 mg by mouth daily.   fluocinonide 0.05 % external solution Commonly known as: LIDEX Apply 1 application topically 2 (two) times daily.   Fluzone High-Dose 0.5 ML injection Generic drug: Influenza vac split quadrivalent PF Fluzone High-Dose 2019-20 (PF) 180 mcg/0.5 mL intramuscular syringe  TO BE ADMINISTERED BY PHARMACIST FOR IMMUNIZATION   hydrocortisone 2.5 % cream APPLY TO RASH/ITCHY SKIN AT BEDTIME   levothyroxine 75 MCG tablet Commonly known as: SYNTHROID Take 75 mcg by mouth daily before breakfast.   metoprolol succinate 100 MG 24 hr tablet Commonly known as: TOPROL-XL TAKE 1 TABLET (100 MG TOTAL) BY MOUTH DAILY. TAKE WITH OR IMMEDIATELY FOLLOWING A MEAL.   multivitamin capsule Take 1 capsule by mouth daily.   mupirocin ointment 2 % Commonly known as: BACTROBAN APPLY TO AFFECTED AREA TWICE A DAY   niacin 500 MG tablet Take 500 mg by mouth daily with breakfast.   nitroGLYCERIN 0.4 MG SL tablet Commonly known as: NITROSTAT Place 1 tablet (0.4 mg total) under the tongue every 5 (five) minutes x 3 doses as needed for chest pain.   timolol 0.5 % ophthalmic solution Commonly known as: TIMOPTIC Place 1 drop into the right eye every morning.   TIMOLOL HEMIHYDRATE OP Place 1 drop into the right eye daily.   VITAMIN E PO Take 400 mg by mouth daily. Plus D       ASK your doctor about these medications    isosorbide mononitrate 30 MG 24 hr tablet Commonly known as: IMDUR TAKE 1 TABLET BY MOUTH EVERY DAY   pantoprazole 40 MG tablet Commonly known as: PROTONIX TAKE 1 TABLET BY MOUTH EVERY DAY  Outstanding Labs/Studies   BMET   Duration of  Discharge Encounter   Greater than 30 minutes including physician time.  Signed, Kathyrn Drown, NP 07/11/2021, 11:09 AM

## 2021-07-21 ENCOUNTER — Emergency Department (HOSPITAL_COMMUNITY)
Admission: EM | Admit: 2021-07-21 | Discharge: 2021-07-21 | Disposition: A | Discharge status: Home / Self Care | Payer: Medicare Other | Attending: Emergency Medicine | Admitting: Emergency Medicine

## 2021-07-21 ENCOUNTER — Emergency Department (HOSPITAL_COMMUNITY): Payer: Medicare Other

## 2021-07-21 ENCOUNTER — Encounter (HOSPITAL_COMMUNITY): Payer: Self-pay

## 2021-07-21 ENCOUNTER — Other Ambulatory Visit: Payer: Self-pay

## 2021-07-21 DIAGNOSIS — Z7982 Long term (current) use of aspirin: Secondary | ICD-10-CM | POA: Insufficient documentation

## 2021-07-21 DIAGNOSIS — I10 Essential (primary) hypertension: Secondary | ICD-10-CM | POA: Insufficient documentation

## 2021-07-21 DIAGNOSIS — Z85828 Personal history of other malignant neoplasm of skin: Secondary | ICD-10-CM | POA: Diagnosis not present

## 2021-07-21 DIAGNOSIS — I209 Angina pectoris, unspecified: Secondary | ICD-10-CM | POA: Insufficient documentation

## 2021-07-21 DIAGNOSIS — R079 Chest pain, unspecified: Secondary | ICD-10-CM | POA: Diagnosis not present

## 2021-07-21 DIAGNOSIS — Z96653 Presence of artificial knee joint, bilateral: Secondary | ICD-10-CM | POA: Diagnosis not present

## 2021-07-21 DIAGNOSIS — R0902 Hypoxemia: Secondary | ICD-10-CM | POA: Diagnosis not present

## 2021-07-21 DIAGNOSIS — R0789 Other chest pain: Secondary | ICD-10-CM | POA: Diagnosis not present

## 2021-07-21 DIAGNOSIS — Z955 Presence of coronary angioplasty implant and graft: Secondary | ICD-10-CM | POA: Diagnosis not present

## 2021-07-21 DIAGNOSIS — Z79899 Other long term (current) drug therapy: Secondary | ICD-10-CM | POA: Insufficient documentation

## 2021-07-21 DIAGNOSIS — E039 Hypothyroidism, unspecified: Secondary | ICD-10-CM | POA: Diagnosis not present

## 2021-07-21 LAB — CBC WITH DIFFERENTIAL/PLATELET
Abs Immature Granulocytes: 0.05 10*3/uL (ref 0.00–0.07)
Basophils Absolute: 0.1 10*3/uL (ref 0.0–0.1)
Basophils Relative: 1 %
Eosinophils Absolute: 0.3 10*3/uL (ref 0.0–0.5)
Eosinophils Relative: 2 %
HCT: 32.2 % — ABNORMAL LOW (ref 36.0–46.0)
Hemoglobin: 10.4 g/dL — ABNORMAL LOW (ref 12.0–15.0)
Immature Granulocytes: 1 %
Lymphocytes Relative: 22 %
Lymphs Abs: 2.3 10*3/uL (ref 0.7–4.0)
MCH: 33.4 pg (ref 26.0–34.0)
MCHC: 32.3 g/dL (ref 30.0–36.0)
MCV: 103.5 fL — ABNORMAL HIGH (ref 80.0–100.0)
Monocytes Absolute: 0.9 10*3/uL (ref 0.1–1.0)
Monocytes Relative: 8 %
Neutro Abs: 7.1 10*3/uL (ref 1.7–7.7)
Neutrophils Relative %: 66 %
Platelets: 261 10*3/uL (ref 150–400)
RBC: 3.11 MIL/uL — ABNORMAL LOW (ref 3.87–5.11)
RDW: 14.4 % (ref 11.5–15.5)
WBC: 10.7 10*3/uL — ABNORMAL HIGH (ref 4.0–10.5)
nRBC: 0 % (ref 0.0–0.2)

## 2021-07-21 LAB — COMPREHENSIVE METABOLIC PANEL
ALT: 15 U/L (ref 0–44)
AST: 25 U/L (ref 15–41)
Albumin: 2.9 g/dL — ABNORMAL LOW (ref 3.5–5.0)
Alkaline Phosphatase: 129 U/L — ABNORMAL HIGH (ref 38–126)
Anion gap: 7 (ref 5–15)
BUN: 26 mg/dL — ABNORMAL HIGH (ref 8–23)
CO2: 23 mmol/L (ref 22–32)
Calcium: 8.8 mg/dL — ABNORMAL LOW (ref 8.9–10.3)
Chloride: 105 mmol/L (ref 98–111)
Creatinine, Ser: 1.19 mg/dL — ABNORMAL HIGH (ref 0.44–1.00)
GFR, Estimated: 43 mL/min — ABNORMAL LOW (ref >60)
Glucose, Bld: 129 mg/dL — ABNORMAL HIGH (ref 70–99)
Potassium: 4.2 mmol/L (ref 3.5–5.1)
Sodium: 135 mmol/L (ref 135–145)
Total Bilirubin: 0.5 mg/dL (ref 0.3–1.2)
Total Protein: 6.4 g/dL — ABNORMAL LOW (ref 6.5–8.1)

## 2021-07-21 LAB — TROPONIN I (HIGH SENSITIVITY)
Troponin I (High Sensitivity): 109 ng/L (ref <18)
Troponin I (High Sensitivity): 103 ng/L (ref <18)

## 2021-07-21 MED ORDER — CLOPIDOGREL BISULFATE 75 MG PO TABS
75.0000 mg | ORAL_TABLET | Freq: Once | ORAL | Status: AC
  Administered 2021-07-21: 75 mg via ORAL
  Filled 2021-07-21: qty 1

## 2021-07-21 MED ORDER — ISOSORBIDE MONONITRATE ER 60 MG PO TB24: 60.0000 mg | ORAL_TABLET | Freq: Every day | ORAL | 0 refills | Status: DC

## 2021-07-21 NOTE — ED Notes (Signed)
Pt discharged and wheeled out of the ED in a wheel chair without difficulty. 

## 2021-07-21 NOTE — ED Triage Notes (Signed)
Patient arrived by [facility]. EMS was called to facility for CP. Patient had a dream that she had CP and never physically had pain. Had recent STEMI and thinks she is thinking of that, NAD

## 2021-07-21 NOTE — ED Provider Notes (Signed)
Emergency Medicine Provider Triage Evaluation Note  Kathryn Carlson , a 87 y.o. female  was evaluated in triage.  Pt complains of chest pain and heaviness.  Patient states when she woke up she was having a dream that she was having chest pain.  Does not know she was physically having chest pain.  When she woke up, she had a heaviness in the middle of her chest.  She continues to have this heaviness.  No associated shortness of breath, nausea, vomiting.  Had a heart attack 2 weeks ago.  Review of Systems  Positive: Cp, chest heaviness Negative: N/v  Physical Exam  BP (!) 143/68   Pulse 73   Temp 98.7 F (37.1 C) (Oral)   Resp 14   SpO2 94%  Gen:   Awake, no distress   Resp:  Normal effort  MSK:   Moves extremities without difficulty    Medical Decision Making  Medically screening exam initiated at 10:06 AM.  Appropriate orders placed.  Kathryn Carlson was informed that the remainder of the evaluation will be completed by another provider, this initial triage assessment does not replace that evaluation, and the importance of remaining in the ED until their evaluation is complete.  Labs, ekg, cxr   Franchot Heidelberg, PA-C 07/21/21 1007    Daleen Bo, MD 07/22/21 1740

## 2021-07-21 NOTE — ED Provider Notes (Signed)
[facility] EMERGENCY DEPARTMENT Provider Note   CSN: [id number] Arrival date & time: 07/21/21  0940     History No chief complaint on file.   Kathryn Carlson is a 87 y.o. female history of CAD with CABG and multiple stents, here presenting with chest pain and heaviness.  [facility]  She states that she had a dream last night and had some chest pain.  She woke up today and had some heaviness in her chest.  She was recently admitted for NSTEMI.  She was adamant that she did not want a heart catheterization at that time.  She is also very clear today that she does not want a heart catheterization.  I also discussed with daughter who states that she is DNR and does not want to have any further interventions.  Patient has been in the ER for about 10 hours already and she states that she has no further chest pain or chest pressure.   The history is provided by the patient and a relative.      Past Medical History:  Diagnosis Date  . BCC (basal cell carcinoma of skin) 01/09/2009   Lower Central Back (tx p bx)  . CAD (coronary artery disease)    possible ant wall MI ('97) - cath & CABG x5  . Exogenous obesity   . GERD (gastroesophageal reflux disease)   . History of nuclear stress test 09/2012   lexiscan; mild perfusion defect in apical anterior & apical region (infarct/scar) - no significant ischemia, low risk   . History of total bilateral knee replacement   . Hypertension   . Hypothyroidism   . Nodular basal cell carcinoma (BCC) 08/17/2017   Left Calf (tx p bx)  . PVC's (premature ventricular contractions)   . SCC (squamous cell carcinoma) [place] 01/29/2004   Right Forearm (Cx3,5FU)  . SCCA (squamous cell carcinoma) of skin 03/13/2005   Mid Upper Back  . SCCA (squamous cell carcinoma) of skin 09/07/2005   Left Elbow(Cx3,5FU)  . SCCA (squamous cell carcinoma) of skin 01/25/2006   Left Brow(in situ) (Cx3,5FU)  . SCCA  (squamous cell carcinoma) of skin 04/27/2006   Inner Left Shoulder(Keratoacanthoma) (Exc.)  . SCCA (squamous cell carcinoma) of skin 03/22/2007   Left Temple(in situ) and Bridge of Nose(in situ) (Cs3,5FU)  . SCCA (squamous cell carcinoma) of skin 05/12/2007   Top of Scalp(Cx3,5FU)  . SCCA (squamous cell carcinoma) of skin 01/28/2011   Right Upper Arm(in situ)  . SCCA (squamous cell carcinoma) of skin 07/26/2012   Glabella, Inferior Tip (Cx3,5FU)  . SCCA (squamous cell carcinoma) of skin 03/08/2013   Left Front Scalp(in situ) and Upper Nose(in situ) (Cx3,5FU)  . SCCA (squamous cell carcinoma) of skin 07/24/2013   Right Lower Leg(Keratoacanthoma)  . SCCA (squamous cell carcinoma) of skin 09/11/2015   Left Scalp(in situ) (Cx3,5FU)  . SCCA (squamous cell carcinoma) of skin 01/15/2016   Left Forearm(in situ) and Left Temple(in situ) (tx p bx)  . SCCA (squamous cell carcinoma) of skin 10/01/2016   Left Mid Forearm(in situ)(tx p bx) and Left Front Scalp(watch)  . SCCA (squamous cell carcinoma) of skin 11/12/2016   Left Temple(Keratoacanthoma) (watch)  . SCCA (squamous cell carcinoma) of skin 02/11/2017   Top Left Hand(Keratoacanthoma) (tx p bx)  . Squamous cell carcinoma in situ (SCCIS) 11/18/1999   Above Left Outer Eyebrow  . Squamous cell carcinoma of scalp    removed - Dr. Denna Haggard  . Superficial basal  cell carcinoma (BCC) 07/14/2004   Left Scapula (Cx3,5FU)    Patient Active Problem List   Diagnosis Date Noted  . NSTEMI (non-ST elevated myocardial infarction) ([place]) 07/08/2021  . Insomnia 05/18/2018  . Fatigue 11/30/2017  . Obesity (BMI 30.0-34.9) 11/30/2017  . DOE (dyspnea on exertion) 11/12/2017  . Aortic stenosis, mild 02/05/2017  . Frequent PVCs 06/04/2014  . NSVT (nonsustained ventricular tachycardia) ([place]) 12/04/2013  . HTN (hypertension) 10/25/2013  . Dyslipidemia 10/25/2013  . S/P CABG x 3 10/25/2013  . Symptomatic bradycardia 10/25/2013    Past Surgical History:   Procedure Laterality Date  . ABDOMINAL HYSTERECTOMY  1984  . APPENDECTOMY  1941  . Carotid Doppler  12/07/2012   R & L ICA - 0-49% diameter reduction  . CORONARY ARTERY BYPASS GRAFT  09/1996   cath & CABG x5 LIMA-LAD, SVG-sequential OD & OM, SVG sequential to PDA & PLA (Dr. Prescott Gum)  . REPLACEMENT TOTAL KNEE Bilateral 2001 & 2003  . TONSILLECTOMY    . TRANSTHORACIC ECHOCARDIOGRAM  08/23/2013   EF 50-55%, LV cavity size mod reduced, mild LVH, mild conc hypertrophy; mild AV stenosis & mild regurg; mild MR - ordered for murmur     OB History   No obstetric history on file.     Family History  Problem Relation Age of Onset  . Heart attack Mother   . Heart attack Father   . Cancer Sister   . Diabetes Sister   . Heart Problems Sister     Social History   Tobacco Use  . Smoking status: Never  . Smokeless tobacco: Never  Substance Use Topics  . Alcohol use: Yes    Comment: "a drink of wine every now and then"    Home Medications Prior to Admission medications   Medication Sig Start Date End Date Taking? Authorizing Provider  isosorbide mononitrate (IMDUR) 60 MG 24 hr tablet Take 1 tablet (60 mg total) by mouth daily. 07/21/21  Yes Drenda Freeze, MD  aspirin 81 MG tablet Take 81 mg by mouth daily.     [provider]  atorvastatin (LIPITOR) 40 MG tablet TAKE 1 TABLET BY MOUTH EVERY DAY 03/05/21   Deberah Pelton, NP  augmented betamethasone dipropionate (DIPROLENE-AF) 0.05 % cream Apply topically 2 (two) times daily.    [provider]  chloridazePOXIDE-amitriptyline (LIMBITROL) 5-12.5 MG per tablet Take 1 tablet by mouth daily.    [provider]  cholecalciferol (VITAMIN D) 1000 UNITS tablet Take 2,000 Units by mouth daily.    [provider]  clopidogrel (PLAVIX) 75 MG tablet Take 1 tablet (75 mg total) by mouth daily. 07/12/21   Tommie Raymond, NP  estradiol (ESTRACE) 1 MG tablet Take 1 mg by mouth daily.    [provider]   fluocinonide (LIDEX) 0.05 % external solution Apply 1 application topically 2 (two) times daily.     [provider]  hydrocortisone 2.5 % cream APPLY TO RASH/ITCHY SKIN AT BEDTIME 11/18/20   Lavonna Monarch, MD  Influenza vac split quadrivalent PF (FLUZONE HIGH-DOSE) 0.5 ML injection Fluzone High-Dose 2019-20 (PF) 180 mcg/0.5 mL intramuscular syringe  TO BE ADMINISTERED BY PHARMACIST FOR IMMUNIZATION    [provider]  levothyroxine (SYNTHROID, LEVOTHROID) 75 MCG tablet Take 75 mcg by mouth daily before breakfast.    [provider]  metoprolol succinate (TOPROL-XL) 100 MG 24 hr tablet TAKE 1 TABLET (100 MG TOTAL) BY MOUTH DAILY. TAKE WITH OR IMMEDIATELY FOLLOWING A MEAL. 10/28/20 07/08/21  Lorretta Harp, MD  Multiple Vitamin (MULTIVITAMIN) capsule Take 1 capsule by mouth daily.    [provider]  mupirocin ointment (BACTROBAN) 2 % APPLY TO AFFECTED AREA TWICE A DAY 02/24/21   Lavonna Monarch, MD  niacin 500 MG tablet Take 500 mg by mouth daily with breakfast.    [provider]  nitroGLYCERIN (NITROSTAT) 0.4 MG SL tablet Place 1 tablet (0.4 mg total) under the tongue every 5 (five) minutes x 3 doses as needed for chest pain. 07/11/21   Tommie Raymond, NP  pantoprazole (PROTONIX) 40 MG tablet TAKE 1 TABLET BY MOUTH EVERY DAY Patient taking differently: Take by mouth as needed. 12/05/13   Hilty, Nadean Corwin, MD  timolol (TIMOPTIC) 0.5 % ophthalmic solution Place 1 drop into the right eye every morning. 08/20/20   [provider]  TIMOLOL HEMIHYDRATE OP Place 1 drop into the right eye daily.    [provider]  VITAMIN E PO Take 400 mg by mouth daily. Plus D    [provider]    Allergies    Novocain [procaine]  Review of Systems   Review of Systems  Cardiovascular:  Positive for chest pain.  All other systems reviewed and are negative.  Physical Exam Updated Vital Signs BP (!) 151/63 (BP Location: Left Arm)   Pulse  78   Temp 97.7 F (36.5 C) (Oral)   Resp 20   SpO2 96%   Physical Exam Vitals and nursing note reviewed.  Constitutional:      Comments: Chronically ill but well-appearing  HENT:     Head: Normocephalic.     Nose: Nose normal.     Mouth/Throat:     Mouth: Mucous membranes are moist.  Eyes:     Extraocular Movements: Extraocular movements intact.     Pupils: Pupils are equal, round, and reactive to light.  Cardiovascular:     Rate and Rhythm: Normal rate and regular rhythm.     Pulses: Normal pulses.     Heart sounds: Normal heart sounds.  Pulmonary:     Effort: Pulmonary effort is normal.     Breath sounds: Normal breath sounds.  Abdominal:     General: Abdomen is flat.     Palpations: Abdomen is soft.  Musculoskeletal:        General: Normal range of motion.     Cervical back: Normal range of motion and neck supple.  Skin:    General: Skin is warm.     Capillary Refill: Capillary refill takes less than 2 seconds.  Neurological:     General: No focal deficit present.     Mental Status: She is alert and oriented to person, place, and time.  Psychiatric:        Mood and Affect: Mood normal.        Behavior: Behavior normal.    ED Results / Procedures / Treatments   Labs (all labs ordered are listed, but only abnormal results are displayed) Labs Reviewed  CBC WITH DIFFERENTIAL/PLATELET - Abnormal; Notable for the following components:      Result Value   WBC 10.7 (*)    RBC 3.11 (*)    Hemoglobin 10.4 (*)    HCT 32.2 (*)    MCV 103.5 (*)    All other components within normal limits  COMPREHENSIVE METABOLIC PANEL - Abnormal; Notable for the following components:   Glucose, Bld 129 (*)    BUN 26 (*)    Creatinine, Ser 1.19 (*)  Calcium 8.8 (*)    Total Protein 6.4 (*)    Albumin 2.9 (*)    Alkaline Phosphatase 129 (*)    GFR, Estimated 43 (*)    All other components within normal limits  TROPONIN I (HIGH SENSITIVITY) - Abnormal; Notable for the following  components:   Troponin I (High Sensitivity) 109 (*)    All other components within normal limits  TROPONIN I (HIGH SENSITIVITY) - Abnormal; Notable for the following components:   Troponin I (High Sensitivity) 103 (*)    All other components within normal limits    EKG EKG Interpretation  Date/Time:  Monday July 21 2021 09:54:33 EDT Ventricular Rate:  72 PR Interval:  216 QRS Duration: 98 QT Interval:  376 QTC Calculation: 411 R Axis:   -8 Text Interpretation: Sinus rhythm with 1st degree A-V block Cannot rule out Anterior infarct , age undetermined Abnormal ECG No significant change since last tracing Confirmed by Wandra Arthurs [phone]) on 07/21/2021 8:56:36 PM  Radiology DG Chest 2 View  Result Date: 07/21/2021 CLINICAL DATA:  Chest pain and heaviness. EXAM: CHEST - 2 VIEW COMPARISON:  07/08/2021 FINDINGS: Previous median sternotomy and CABG procedure. Stable asymmetric elevation of the right hemidiaphragm. Bibasilar scarring noted. No interstitial edema, pleural effusion or airspace consolidation. Thoracic spondylosis noted. IMPRESSION: 1. No acute cardiopulmonary abnormalities. 2. Bibasilar scarring. Electronically Signed   By: Kerby Moors M.D.   On: 07/21/2021 10:48    Procedures Procedures   Medications Ordered in ED Medications  clopidogrel (PLAVIX) tablet 75 mg (has no administration in time range)    ED Course  I have reviewed the triage vital signs and the nursing notes.  Pertinent labs & imaging results that were available during my care of the patient were reviewed by me and considered in my medical decision making (see chart for details).    MDM Rules/Calculators/A&P                          HALLEL MELLAND is a 87 y.o. female here presenting with chest pressure.  Patient recently had an NSTEMI.  Patient has some mild chest pressure and she is adamant that she does not want any intervention right now.  She is already on aspirin and Plavix and also Imdur.  Plan  to get troponins and consult cardiology  10:07 PM Troponins are 109 down to 103.  She again is denying any chest pain right now.  I talked to her and her daughter who states that she still does not want to have any interventions.  I discussed case with cardiology (Dr. Blossom Hoops) who agree with the plan and recommend increase Imdur to 60 mg daily and if she has pain she can take no nitro and if pain persist then she comes to the hospital for further evaluation.  Patient is agreement with that plan.    Final Clinical Impression(s) / ED Diagnoses Final diagnoses:  Chest pain, unspecified type  Angina pectoris, unspecified ([place])    Rx / DC Orders ED Discharge Orders          Ordered    isosorbide mononitrate (IMDUR) 60 MG 24 hr tablet  Daily        07/21/21 2144             Drenda Freeze, MD 07/21/21 2208

## 2021-07-21 NOTE — Discharge Instructions (Addendum)
Increase Imdur to 60 mg daily.  If you have pain take nitroglycerin and if you have persistent pain after 5 minutes, then call EMS  Your heart enzymes today are improved from previous  See your doctor for follow-up  Return to ER if you have worse chest pain, shortness of breath.

## 2021-07-27 ENCOUNTER — Encounter: Payer: Self-pay | Admitting: Dermatology

## 2021-07-27 NOTE — Progress Notes (Signed)
   Follow-Up Visit   Subjective  Kathryn Carlson is a 87 y.o. female who presents for the following: Skin Problem (Patient here today for spot on left calf x few months. Per patient the spot is bleeding, painful to touch.).  Nonhealing spot on left leg that bleeds and is painful Location:  Duration:  Quality:  Associated Signs/Symptoms: Modifying Factors:  Severity:  Timing: Context:   Objective  Well appearing patient in no apparent distress; mood and affect are within normal limits. Left Lower Leg - Posterior Eroded 1 cm crust       Skin examination limited to scalp, hands, arms, legs   Assessment & Plan    Neoplasm of uncertain behavior of skin Left Lower Leg - Posterior  Skin / nail biopsy Type of biopsy: tangential   Informed consent: discussed and consent obtained   Timeout: patient name, date of birth, surgical site, and procedure verified   Procedure prep:  Patient was prepped and draped in usual sterile fashion (Non sterile) Prep type:  Chlorhexidine Anesthesia: the lesion was anesthetized in a standard fashion   Anesthetic:  1% lidocaine w/ epinephrine 1-100,000 local infiltration Instrument used: flexible razor blade   Outcome: patient tolerated procedure well   Post-procedure details: wound care instructions given    Destruction of lesion Complexity: simple   Destruction method: electrodesiccation and curettage   Informed consent: discussed and consent obtained   Timeout:  patient name, date of birth, surgical site, and procedure verified Anesthesia: the lesion was anesthetized in a standard fashion   Anesthetic:  1% lidocaine w/ epinephrine 1-100,000 local infiltration Curettage performed in three different directions: Yes   Curettage cycles:  3 Lesion length (cm):  2 Lesion width (cm):  2 Margin per side (cm):  0 Final wound size (cm):  2 Hemostasis achieved with:  aluminum chloride Outcome: patient tolerated procedure well with no  complications   Post-procedure details: wound care instructions given    Specimen 1 - Surgical pathology Differential Diagnosis:  Check Margins: No  After shave biopsy the base was curetted and cauterized.  She fully understands that this will be a difficult spot to heal.      I, Lavonna Monarch, MD, have reviewed all documentation for this visit.  The documentation on 07/27/21 for the exam, diagnosis, procedures, and orders are all accurate and complete.

## 2021-08-05 ENCOUNTER — Ambulatory Visit (INDEPENDENT_AMBULATORY_CARE_PROVIDER_SITE_OTHER): Payer: Medicare Other | Admitting: Cardiovascular Disease

## 2021-08-05 ENCOUNTER — Encounter: Payer: Self-pay | Admitting: Cardiovascular Disease

## 2021-08-05 ENCOUNTER — Other Ambulatory Visit: Payer: Self-pay

## 2021-08-05 DIAGNOSIS — E785 Hyperlipidemia, unspecified: Secondary | ICD-10-CM | POA: Diagnosis not present

## 2021-08-05 DIAGNOSIS — I2510 Atherosclerotic heart disease of native coronary artery without angina pectoris: Secondary | ICD-10-CM

## 2021-08-05 DIAGNOSIS — I10 Essential (primary) hypertension: Secondary | ICD-10-CM | POA: Diagnosis not present

## 2021-08-05 DIAGNOSIS — I350 Nonrheumatic aortic (valve) stenosis: Secondary | ICD-10-CM | POA: Diagnosis not present

## 2021-08-05 DIAGNOSIS — Z951 Presence of aortocoronary bypass graft: Secondary | ICD-10-CM

## 2021-08-05 NOTE — Assessment & Plan Note (Signed)
History of essential hypertension blood pressure measured today at 136/66.  She is on metoprolol.

## 2021-08-05 NOTE — Progress Notes (Signed)
08/05/2021 Kathryn Carlson   1927/01/20  [id number]  Primary Physician Kathryn Bunting, MD Primary Cardiologist: Kathryn Harp MD FACP, Kathryn Carlson, Kathryn Carlson, Kathryn Carlson  HPI:  Kathryn Carlson is a 87 y.o.  moderately overweight widowed Caucasian female mother of 4 children who transitioned her care from Kathryn Carlson to myself at her request.  She lost one of her 2 sons several years ago from dementia.  I last saw her in the office  03/15/2020.  She is accompanied by her daughter Kathryn Carlson today.  She was formally a patient of Kathryn Carlson. She has a history of ischemic heart disease status post myocardial infarction at age 50. She had coronary artery bypass grafting in 1997 by Kathryn Carlson  Her last Myoview performed in 2013 was nonischemic. Her other Problems include symptomatic PVCs evaluated by Kathryn Carlson in the past as well as history of hypertension and hyperlipidemia. She denies chest pain or shortness of breath since I saw her in the office one year ago. Echo performed 01/28/18 revealed normal LV systolic function with mild aortic stenosis.   She lives independently in a assisted care facility.  She was admitted to Kathryn Carlson 07/08/2021 for 3 days with non-STEMI.  Her symptoms were pain between her shoulder blades.  Her troponins rose to 5000.  2D echo revealed normal LV function with moderate aortic stenosis.  It was elected not to pursue an aggressive interventional approach.  She was started on clopidogrel.  She has had no further symptoms.  Current Meds  Medication Sig   aspirin 81 MG tablet Take 81 mg by mouth daily.    atorvastatin (LIPITOR) 40 MG tablet TAKE 1 TABLET BY MOUTH EVERY DAY   augmented betamethasone dipropionate (DIPROLENE-AF) 0.05 % cream Apply topically 2 (two) times daily.   chloridazePOXIDE-amitriptyline (LIMBITROL) 5-12.5 MG per tablet Take 1 tablet by mouth daily.   cholecalciferol (VITAMIN D) 1000 UNITS tablet Take 2,000 Units by mouth daily.   clopidogrel  (PLAVIX) 75 MG tablet Take 1 tablet (75 mg total) by mouth daily.   estradiol (ESTRACE) 1 MG tablet Take 1 mg by mouth daily.   fluocinonide (LIDEX) 0.05 % external solution Apply 1 application topically 2 (two) times daily.    hydrocortisone 2.5 % cream APPLY TO RASH/ITCHY SKIN AT BEDTIME   Influenza vac split quadrivalent PF (FLUZONE HIGH-DOSE) 0.5 ML injection Fluzone High-Dose 2019-20 (PF) 180 mcg/0.5 mL intramuscular syringe  TO BE ADMINISTERED BY PHARMACIST FOR IMMUNIZATION   isosorbide mononitrate (IMDUR) 60 MG 24 hr tablet Take 1 tablet (60 mg total) by mouth daily.   levothyroxine (SYNTHROID, LEVOTHROID) 75 MCG tablet Take 75 mcg by mouth daily before breakfast.   Multiple Vitamin (MULTIVITAMIN) capsule Take 1 capsule by mouth daily.   mupirocin ointment (BACTROBAN) 2 % APPLY TO AFFECTED AREA TWICE A DAY   niacin 500 MG tablet Take 500 mg by mouth daily with breakfast.   nitroGLYCERIN (NITROSTAT) 0.4 MG SL tablet Place 1 tablet (0.4 mg total) under the tongue every 5 (five) minutes x 3 doses as needed for chest pain.   pantoprazole (PROTONIX) 40 MG tablet TAKE 1 TABLET BY MOUTH EVERY DAY (Patient taking differently: Take by mouth as needed.)   timolol (TIMOPTIC) 0.5 % ophthalmic solution Place 1 drop into the right eye every morning.   TIMOLOL HEMIHYDRATE OP Place 1 drop into the right eye daily.   VITAMIN E PO Take 400 mg by mouth daily. Plus D   Current Facility-Administered Medications  for the 08/05/21 encounter (Office Visit) with Kathryn Harp, MD  Medication   triamcinolone acetonide [facility]) injection 40 mg     Allergies  Allergen Reactions   Novocain [Procaine]     Social History   Socioeconomic History   Marital status: Widowed    Spouse name: Not on file   Number of children: 4   Years of education: 69   Highest education level: Not on file  Occupational History   Not on file  Tobacco Use   Smoking status: Never   Smokeless tobacco: Never  Substance  and Sexual Activity   Alcohol use: Yes    Comment: "a drink of wine every now and then"   Drug use: Not on file   Sexual activity: Not on file  Other Topics Concern   Not on file  Social History Narrative   Not on file   Social Determinants of Health   Financial Resource Strain: Not on file  Food Insecurity: Not on file  Transportation Needs: Not on file  Physical Activity: Not on file  Stress: Not on file  Social Connections: Not on file  Intimate Partner Violence: Not on file     Review of Systems: General: negative for chills, fever, night sweats or weight changes.  Cardiovascular: negative for chest pain, dyspnea on exertion, edema, orthopnea, palpitations, paroxysmal nocturnal dyspnea or shortness of breath Dermatological: negative for rash Respiratory: negative for cough or wheezing Urologic: negative for hematuria Abdominal: negative for nausea, vomiting, diarrhea, bright red blood per rectum, melena, or hematemesis Neurologic: negative for visual changes, syncope, or dizziness All other systems reviewed and are otherwise negative except as noted above.    Blood pressure 136/66, pulse 82, height '5\' 1"'$  (1.549 m), weight 160 lb (72.6 kg).  General appearance: alert and no distress Neck: no adenopathy, no carotid bruit, no JVD, supple, symmetrical, trachea midline, and thyroid not enlarged, symmetric, no tenderness/mass/nodules Lungs: clear to auscultation bilaterally Heart: 2/6 outflow tract murmur consistent with aortic stenosis Extremities: extremities normal, atraumatic, no cyanosis or edema Pulses: 2+ and symmetric Skin: Skin color, texture, turgor normal. No rashes or lesions Neurologic: Grossly normal  EKG not performed today  ASSESSMENT AND PLAN:   HTN (hypertension) History of essential hypertension blood pressure measured today at 136/66.  She is on metoprolol.  Dyslipidemia History of dyslipidemia on statin therapy with lipid profile performed  07/09/2021 revealing a total cholesterol 119, LDL of 60 and HDL 40.  S/P CABG x 3 History of CAD status post CABG x3 by Kathryn Carlson in 1997 with a LIMA to the LAD, vein to an intermediate branch, obtuse marginal branch, PDA and PLA sequentially.  Myoview performed in 2013 was nonischemic.  She was recently admitted for 3 days 07/08/2021 through the 15th with a non-STEMI.  Her pain presented as mid scapular back pain.  Her troponins increased to close to 5000.  It was elected not to pursue an invasive approach.  She was started on clopidogrel.  She has had no further symptoms.  Aortic stenosis, mild History of aortic stenosis previously mild in nature with recent 2D echo performed 07/09/2021 showing progression to moderate aortic stenosis.  Her valve area was 1 cm with a mean gradient of 12 mmHg with normal LV function.  She does complain of some dyspnea.  I do not think this needs to be followed.     Kathryn Harp MD FACP,FACC,FAHA, [facility] 08/05/2021 2:38 PM

## 2021-08-05 NOTE — Assessment & Plan Note (Signed)
History of CAD status post CABG x3 by Dr. Darcey Nora in 1997 with a LIMA to the LAD, vein to an intermediate branch, obtuse marginal branch, PDA and PLA sequentially.  Myoview performed in 2013 was nonischemic.  She was recently admitted for 3 days 07/08/2021 through the 15th with a non-STEMI.  Her pain presented as mid scapular back pain.  Her troponins increased to close to 5000.  It was elected not to pursue an invasive approach.  She was started on clopidogrel.  She has had no further symptoms.

## 2021-08-05 NOTE — Assessment & Plan Note (Signed)
History of dyslipidemia on statin therapy with lipid profile performed 07/09/2021 revealing a total cholesterol 119, LDL of 60 and HDL 40.

## 2021-08-05 NOTE — Assessment & Plan Note (Signed)
History of aortic stenosis previously mild in nature with recent 2D echo performed 07/09/2021 showing progression to moderate aortic stenosis.  Her valve area was 1 cm with a mean gradient of 12 mmHg with normal LV function.  She does complain of some dyspnea.  I do not think this needs to be followed.

## 2021-08-05 NOTE — Patient Instructions (Signed)
Medication Instructions:  Your physician recommends that you continue on your current medications as directed. Please refer to the Current Medication list given to you today.  *If you need a refill on your cardiac medications before your next appointment, please call your pharmacy*   Follow-Up: At [facility], you and your health needs are our priority.  As part of our continuing mission to provide you with exceptional heart care, we have created designated Provider Care Teams.  These Care Teams include your primary Cardiologist (physician) and Advanced Practice Providers (APPs -  Physician Assistants and Nurse Practitioners) who all work together to provide you with the care you need, when you need it.  We recommend signing up for the patient portal called "MyChart".  Sign up information is provided on this After Visit Summary.  MyChart is used to connect with patients for Virtual Visits (Telemedicine).  Patients are able to view lab/test results, encounter notes, upcoming appointments, etc.  Non-urgent messages can be sent to your provider as well.   To learn more about what you can do with MyChart, go to [email or web address].    Your next appointment:   6 month(s)  The format for your next appointment:   In Person  Provider:   You will see one of the following Advanced Practice Providers on your designated Care Team:    Callie Goodrich, PA-C  Jesse Cleaver, FNP  Then, Jonathan Berry, MD will plan to see you again in 12 month(s). 

## 2021-08-20 ENCOUNTER — Other Ambulatory Visit: Payer: Self-pay | Admitting: Cardiovascular Disease

## 2021-08-20 NOTE — Telephone Encounter (Signed)
*  STAT* If patient is at the pharmacy, call can be transferred to refill team.   1. Which medications need to be refilled? (please list name of each medication and dose if known) isosorbide mononitrate  2. Which pharmacy/location (including street and city if local pharmacy) is medication to be sent to? [facility]   3. Do they need a 30 day or 90 day supply? Not sure   Patient is out

## 2021-09-11 ENCOUNTER — Other Ambulatory Visit: Payer: Self-pay | Admitting: Cardiovascular Disease

## 2021-09-16 DIAGNOSIS — Z23 Encounter for immunization: Secondary | ICD-10-CM | POA: Diagnosis not present

## 2021-10-02 ENCOUNTER — Telehealth: Payer: Self-pay | Admitting: Dermatology

## 2021-10-02 MED ORDER — CLOBETASOL PROPIONATE 0.05 % EX FOAM: Freq: Two times a day (BID) | CUTANEOUS | 2 refills | Status: DC

## 2021-10-02 NOTE — Telephone Encounter (Signed)
Patient has appointment for scalp itching on 10/20 with [facility]. She states that itching is keeping her up and night and she has talked to ST about this before. She would like to know if ST will send Rx that might help to [facility] on [address]

## 2021-10-02 NOTE — Telephone Encounter (Signed)
Phone call to patient to let her know we called in clobetasol foam.

## 2021-10-07 ENCOUNTER — Other Ambulatory Visit: Payer: Self-pay | Admitting: Cardiovascular Disease

## 2021-10-08 ENCOUNTER — Other Ambulatory Visit: Payer: Self-pay | Admitting: General Practice

## 2021-10-13 ENCOUNTER — Other Ambulatory Visit: Payer: Self-pay | Admitting: Cardiovascular Disease

## 2021-10-13 DIAGNOSIS — I10 Essential (primary) hypertension: Secondary | ICD-10-CM | POA: Diagnosis not present

## 2021-10-14 ENCOUNTER — Telehealth: Payer: Self-pay | Admitting: Cardiovascular Disease

## 2021-10-14 NOTE — Telephone Encounter (Signed)
Pt c/o BP issue: STAT if pt c/o blurred vision, one-sided weakness or slurred speech  1. What are your last 5 BP readings?  162/75 (10/18) 187/81 75 (10/17--when EMS took it)  203/80's (10/17--before she called EMS)  2. Are you having any other symptoms (ex. Dizziness, headache, blurred vision, passed out)?  Patient states she is not having any symptoms  3. What is your BP issue?   Patient states her BP became extremely elevated last and she called EMS. She states she told them she wasn't in any pain and they took 2.5 hours to arrive. She states when they arrived her heart sounded good and BP eventually went down so she didn't go to the hospital. She states she is concerned because her BP doesn't normally get up to the 200's and yesterday she had her flu shot at [facility]. She assumes the flu shot may have caused the elevated BP.

## 2021-10-14 NOTE — Telephone Encounter (Signed)
Called patient, she states yesterday she had higher blood pressure readings and with her cardiac history she was concerned. She called EMS, but when they got there she was better they did an EKG and said it was normal. She states normally her BP log shows blood pressure readings of 126/65, 127/63, she states she got her flu shot yesterday and she thinks it was related to this. I did advise her to continue to monitor her BP at home. She denies any symptoms at this time- she was concerned about taking her medications all at once in the morning-and wanted to make sure this was okay to do.   She states that she will monitor BP and call back with any concerns.

## 2021-10-15 NOTE — Telephone Encounter (Signed)
Noted. Patient aware to call back if continued BP issues.

## 2021-10-16 ENCOUNTER — Other Ambulatory Visit: Payer: Self-pay

## 2021-10-16 ENCOUNTER — Ambulatory Visit (INDEPENDENT_AMBULATORY_CARE_PROVIDER_SITE_OTHER): Payer: Medicare Other | Admitting: Dermatology

## 2021-10-16 ENCOUNTER — Encounter: Payer: Self-pay | Admitting: Dermatology

## 2021-10-16 DIAGNOSIS — D044 Carcinoma in situ of skin of scalp and neck: Secondary | ICD-10-CM

## 2021-10-16 DIAGNOSIS — D485 Neoplasm of uncertain behavior of skin: Secondary | ICD-10-CM

## 2021-10-16 DIAGNOSIS — L659 Nonscarring hair loss, unspecified: Secondary | ICD-10-CM

## 2021-10-16 NOTE — Patient Instructions (Signed)

## 2021-10-22 ENCOUNTER — Telehealth: Payer: Self-pay | Admitting: Cardiovascular Disease

## 2021-10-22 NOTE — Telephone Encounter (Signed)
Spoke with pt. She report for the past two weeks she's been experiencing occasional skip beats. She state symptoms occur maybe 4-5 times but denies any other symptoms and states she overall feels fine. Pt report she was instructed by facility nurse to contact Dr. [name] to make him aware and just doing as she's told. She state she doesn't feel like she needs an appointment.   BP 132/64 HR 65 133/64 HR 62

## 2021-10-22 NOTE — Telephone Encounter (Signed)
Pt c/o BP issue: STAT if pt c/o blurred vision, one-sided weakness or slurred speech  1. What are your last 5 BP readings? 132/64 Pulse 65 133/64 Pulse 62-Yesterday  2. Are you having any other symptoms (ex. Dizziness, headache, blurred vision, passed out)?  No  3. What is your BP issue? Pt states she is having a hesitation heart beat, she had her flu shot last week , pt is not having any pain, Pt states nothing is wrong with her and she wasn't aware of the hesitation heartbeat, she was asked to call to make Dr. [name] aware by the staff this morning. She said she doesn't need to see anyone. Pt says she have a meeting today at 2pm so she will not be available at that time.

## 2021-10-23 NOTE — Telephone Encounter (Signed)
Spoke with pt regarding recent issues with heart rate and palpitations. Pt states that she can still feel the occasional skipped beat but has no symptoms otherwise. Pt took her blood pressure today after meds with result 125/63 with HR 65. Pt states that she is not terribly concerned and would like to call back on Monday if she needs anything else moving forward. Will route to MD as FYI. Pt verbalizes understanding.

## 2021-10-23 NOTE — Telephone Encounter (Signed)
Follow Up:       Patient was calling back to see what was decided about her low heart rate?Marland Kitchen

## 2021-11-02 ENCOUNTER — Other Ambulatory Visit: Payer: Self-pay | Admitting: Cardiovascular Disease

## 2021-11-02 NOTE — Progress Notes (Signed)
   Follow-Up Visit   Subjective  Kathryn Carlson is a 87 y.o. female who presents for the following: Skin Problem (Patient here today for itching on her scalp, per patient she was told by Dr. Denna Haggard that she has cancer on her scalp and she wants to know what she can put on her scalp for the itch that won't hurt the skin cancer?).  Skin cancer (scalp Location:  Duration:  Quality:  Associated Signs/Symptoms: Modifying Factors:  Severity:  Timing: Context:   Objective  Well appearing patient in no apparent distress; mood and affect are within normal limits. Mid Parietal Scalp A 4 cm patch of pink crust with 1 hypertrophic area that patient points to his source of itching and sometimes pain.         A focused examination was performed including head and neck. Relevant physical exam findings are noted in the Assessment and Plan.   Assessment & Plan    Alopecia Mid Parietal Scalp  Discussed minoxidil   Carcinoma in situ of skin of scalp and neck Mid Parietal Scalp  Skin / nail biopsy Type of biopsy: tangential   Informed consent: discussed and consent obtained   Timeout: patient name, date of birth, surgical site, and procedure verified   Procedure prep:  Patient was prepped and draped in usual sterile fashion (Non sterile) Prep type:  Chlorhexidine Anesthesia: the lesion was anesthetized in a standard fashion   Anesthetic:  1% lidocaine w/ epinephrine 1-100,000 local infiltration Instrument used: flexible razor blade   Outcome: patient tolerated procedure well   Post-procedure details: wound care instructions given    Destruction of lesion Complexity: simple   Destruction method: electrodesiccation and curettage   Informed consent: discussed and consent obtained   Timeout:  patient name, date of birth, surgical site, and procedure verified Anesthesia: the lesion was anesthetized in a standard fashion   Anesthetic:  1% lidocaine w/ epinephrine 1-100,000 local  infiltration Curettage performed in three different directions: Yes   Curettage cycles:  1 Lesion length (cm):  1.8 Lesion width (cm):  1.8 Margin per side (cm):  0 Final wound size (cm):  1.8 Hemostasis achieved with:  aluminum chloride Outcome: patient tolerated procedure well with no complications   Post-procedure details: wound care instructions given    Destruction of lesion  Specimen 1 - Surgical pathology Differential Diagnosis: bcc vs [id number]  Check Margins: No  Patient has essentially no interest in an aggressive approach to treating skin cancer that has been rather stable for years and has virtually no risk of spreading beyond the skin.  However the area that has become symptomatic will be biopsied to make sure it is noninvasive and will be treated with cautery plus curettage.      I, Lavonna Monarch, MD, have reviewed all documentation for this visit.  The documentation on 11/02/21 for the exam, diagnosis, procedures, and orders are all accurate and complete.

## 2021-11-24 DIAGNOSIS — M859 Disorder of bone density and structure, unspecified: Secondary | ICD-10-CM | POA: Diagnosis not present

## 2021-11-24 DIAGNOSIS — E785 Hyperlipidemia, unspecified: Secondary | ICD-10-CM | POA: Diagnosis not present

## 2021-11-24 DIAGNOSIS — E039 Hypothyroidism, unspecified: Secondary | ICD-10-CM | POA: Diagnosis not present

## 2021-11-24 DIAGNOSIS — I10 Essential (primary) hypertension: Secondary | ICD-10-CM | POA: Diagnosis not present

## 2021-11-28 ENCOUNTER — Other Ambulatory Visit: Payer: Self-pay | Admitting: Cardiovascular Disease

## 2021-12-01 DIAGNOSIS — K219 Gastro-esophageal reflux disease without esophagitis: Secondary | ICD-10-CM | POA: Diagnosis not present

## 2021-12-01 DIAGNOSIS — R82998 Other abnormal findings in urine: Secondary | ICD-10-CM | POA: Diagnosis not present

## 2021-12-01 DIAGNOSIS — Z008 Encounter for other general examination: Secondary | ICD-10-CM | POA: Diagnosis not present

## 2021-12-01 DIAGNOSIS — Z1331 Encounter for screening for depression: Secondary | ICD-10-CM | POA: Diagnosis not present

## 2021-12-01 DIAGNOSIS — I129 Hypertensive chronic kidney disease with stage 1 through stage 4 chronic kidney disease, or unspecified chronic kidney disease: Secondary | ICD-10-CM | POA: Diagnosis not present

## 2021-12-01 DIAGNOSIS — Z1339 Encounter for screening examination for other mental health and behavioral disorders: Secondary | ICD-10-CM | POA: Diagnosis not present

## 2021-12-01 DIAGNOSIS — I10 Essential (primary) hypertension: Secondary | ICD-10-CM | POA: Diagnosis not present

## 2021-12-01 DIAGNOSIS — N1831 Chronic kidney disease, stage 3a: Secondary | ICD-10-CM | POA: Diagnosis not present

## 2021-12-01 DIAGNOSIS — D638 Anemia in other chronic diseases classified elsewhere: Secondary | ICD-10-CM | POA: Diagnosis not present

## 2021-12-01 DIAGNOSIS — I25810 Atherosclerosis of coronary artery bypass graft(s) without angina pectoris: Secondary | ICD-10-CM | POA: Diagnosis not present

## 2021-12-03 ENCOUNTER — Ambulatory Visit: Payer: Medicare Other | Admitting: General Practice

## 2021-12-26 DIAGNOSIS — E785 Hyperlipidemia, unspecified: Secondary | ICD-10-CM | POA: Diagnosis not present

## 2021-12-26 DIAGNOSIS — E039 Hypothyroidism, unspecified: Secondary | ICD-10-CM | POA: Diagnosis not present

## 2021-12-26 DIAGNOSIS — I10 Essential (primary) hypertension: Secondary | ICD-10-CM | POA: Diagnosis not present

## 2021-12-26 DIAGNOSIS — I129 Hypertensive chronic kidney disease with stage 1 through stage 4 chronic kidney disease, or unspecified chronic kidney disease: Secondary | ICD-10-CM | POA: Diagnosis not present

## 2022-01-01 ENCOUNTER — Other Ambulatory Visit: Payer: Self-pay | Admitting: Cardiovascular Disease

## 2022-01-02 ENCOUNTER — Other Ambulatory Visit: Payer: Self-pay | Admitting: Cardiology

## 2022-01-07 ENCOUNTER — Encounter: Payer: Self-pay | Admitting: Dermatology

## 2022-01-07 ENCOUNTER — Ambulatory Visit (INDEPENDENT_AMBULATORY_CARE_PROVIDER_SITE_OTHER): Payer: Medicare Other | Admitting: Dermatology

## 2022-01-07 ENCOUNTER — Other Ambulatory Visit: Payer: Self-pay

## 2022-01-07 DIAGNOSIS — C4449 Other specified malignant neoplasm of skin of scalp and neck: Secondary | ICD-10-CM

## 2022-01-07 DIAGNOSIS — C4499 Other specified malignant neoplasm of skin, unspecified: Secondary | ICD-10-CM

## 2022-01-07 DIAGNOSIS — L820 Inflamed seborrheic keratosis: Secondary | ICD-10-CM | POA: Diagnosis not present

## 2022-01-07 DIAGNOSIS — D485 Neoplasm of uncertain behavior of skin: Secondary | ICD-10-CM

## 2022-01-07 NOTE — Patient Instructions (Signed)

## 2022-01-14 ENCOUNTER — Telehealth: Payer: Self-pay | Admitting: *Deleted

## 2022-01-14 NOTE — Telephone Encounter (Signed)
Pathology to patient.  °

## 2022-01-14 NOTE — Telephone Encounter (Signed)
-----   Message from Lavonna Monarch, MD sent at 01/14/2022  4:30 AM EST ----- Unless the site of the skin cancer does not heal, there will be no further procedure.

## 2022-01-28 NOTE — Progress Notes (Signed)
Cardiology Clinic Note   Patient Name: Kathryn Carlson Date of Encounter: 01/29/2022  Primary Care Provider:  Burnard Bunting, MD Primary Cardiologist:  Quay Burow, MD  Patient Profile    Kathryn Halim. Carlson 88 year old female presents the clinic today for follow-up evaluation of her hypertension and PVCs.  Past Medical History    Past Medical History:  Diagnosis Date   BCC (basal cell carcinoma of skin) 01/09/2009   Lower Central Back (tx p bx)   CAD (coronary artery disease)    possible ant wall MI ('97) - cath & CABG x5   Exogenous obesity    GERD (gastroesophageal reflux disease)    History of nuclear stress test 09/2012   lexiscan; mild perfusion defect in apical anterior & apical region (infarct/scar) - no significant ischemia, low risk    History of total bilateral knee replacement    Hypertension    Hypothyroidism    Nodular basal cell carcinoma (BCC) 08/17/2017   Left Calf (tx p bx)   PVC's (premature ventricular contractions)    SCC (squamous cell carcinoma) [place] 01/29/2004   Right Forearm (Cx3,5FU)   SCCA (squamous cell carcinoma) of skin 03/13/2005   Mid Upper Back   SCCA (squamous cell carcinoma) of skin 09/07/2005   Left Elbow(Cx3,5FU)   SCCA (squamous cell carcinoma) of skin 01/25/2006   Left Brow(in situ) (Cx3,5FU)   SCCA (squamous cell carcinoma) of skin 04/27/2006   Inner Left Shoulder(Keratoacanthoma) (Exc.)   SCCA (squamous cell carcinoma) of skin 03/22/2007   Left Temple(in situ) and Bridge of Nose(in situ) (Cs3,5FU)   SCCA (squamous cell carcinoma) of skin 05/12/2007   Top of Scalp(Cx3,5FU)   SCCA (squamous cell carcinoma) of skin 01/28/2011   Right Upper Arm(in situ)   SCCA (squamous cell carcinoma) of skin 07/26/2012   Glabella, Inferior Tip (Cx3,5FU)   SCCA (squamous cell carcinoma) of skin 03/08/2013   Left Front Scalp(in situ) and Upper Nose(in situ) (Cx3,5FU)   SCCA (squamous cell carcinoma) of skin 07/24/2013   Right Lower  Leg(Keratoacanthoma)   SCCA (squamous cell carcinoma) of skin 09/11/2015   Left Scalp(in situ) (Cx3,5FU)   SCCA (squamous cell carcinoma) of skin 01/15/2016   Left Forearm(in situ) and Left Temple(in situ) (tx p bx)   SCCA (squamous cell carcinoma) of skin 10/01/2016   Left Mid Forearm(in situ)(tx p bx) and Left Front Scalp(watch)   SCCA (squamous cell carcinoma) of skin 11/12/2016   Left Temple(Keratoacanthoma) (watch)   SCCA (squamous cell carcinoma) of skin 02/11/2017   Top Left Hand(Keratoacanthoma) (tx p bx)   Squamous cell carcinoma in situ (SCCIS) 11/18/1999   Above Left Outer Eyebrow   Squamous cell carcinoma of scalp    removed - Dr. Denna Haggard   Superficial basal cell carcinoma (BCC) 07/14/2004   Left Scapula (Cx3,5FU)   Past Surgical History:  Procedure Laterality Date   ABDOMINAL HYSTERECTOMY  1984   APPENDECTOMY  1941   Carotid Doppler  12/07/2012   R & L ICA - 0-49% diameter reduction   CORONARY ARTERY BYPASS GRAFT  09/1996   cath & CABG x5 LIMA-LAD, SVG-sequential OD & OM, SVG sequential to PDA & PLA (Dr. Prescott Gum)   REPLACEMENT TOTAL KNEE Bilateral 2001 & 2003   TONSILLECTOMY     TRANSTHORACIC ECHOCARDIOGRAM  08/23/2013   EF 50-55%, LV cavity size mod reduced, mild LVH, mild conc hypertrophy; mild AV stenosis & mild regurg; mild MR - ordered for murmur    Allergies  Allergies  Allergen Reactions  Novocain [Procaine]     History of Present Illness    [name] has a PMH of hypertension, NSVT, frequent PVCs, aortic stenosis, coronary artery disease status post CABG x 3 1997, dyslipidemia, symptomatic bradycardia, HLD, and lower extremity edema. Echocardiogram 03/04/2020 showed mild aortic stenosis, moderate aortic regurgitation, 50-55% LVEF, and G2 DD.  She is a former patient of Dr. Rollene Fare and Dr. Debara Pickett. She is now followed by Dr. [name]. She had an MI at age 33. Underwent nuclear stress test 2013 which showed no ischemia. She was last seen by Dr. [name] on  3/21 during that time she was doing well.  She contacted the nurse triage line on 09/03/20 with concerns about swelling in her lower extremities. She was started on HCTZ 12.5 mg daily. She was seen in this clinic by Sande Rives, PA-C on 10/02/2020. During that time she was noted to have continued lower extremity edema. Her creatinine was stable. She did have some increased dyspnea with exertion that was felt to be related to deconditioning. She denied orthopnea or PND. Her weight continues to be stable from a previous visit. She denied palpitations, lightheadedness, dizziness. She did report some numbness and bilateral feet which she attributed to neuropathy. Her main complaint during the visit was related to her memory loss. She reported that this was not a new occurrence and she had noted increased forgetfulness over the last several years. She was instructed to follow-up with her PCP.  She presented to the clinic 01/28/21 for follow-up evaluation stated she was having trouble sleeping and had a poor appetite.  She had been trying to find low-dose melatonin that was suggested by her PCP.  However, she was only  able to find 10 mg dosing at the store.  I instructed her to look in the children section.  She had been supplementing her diet with boost supplement drinks.  She stated that she had lost around 16 pounds.  She reported that she  had cravings for food however when the food arrived she did not have any desire to eat.  She remained physically active walking at friend's home daily.  She noticed brief occasional episodes of what she described as "flutters".  She reported they lasted for only seconds and then dissipated on their own.  She reported that she had labs drawn recently at Dr. Jacquiline Doe office, we will requested.  I recommended that she increase her physical activity as tolerated, continue her heart healthy diet, and follow-up in 6 months.  She was seen by Dr. [name] in follow-up on 08/05/2021.   During that time her admission to [facility] 07/08/2021 for 3 days with NSTEMI was reviewed.  Her symptoms included pain between her shoulder blades.  Her troponins rose to 5000.  Echocardiogram showed normal LV function and moderate aortic stenosis.  Conservative management was pursued.  She was started on Plavix and denied further symptoms at her follow-up visit.  She presents to the clinic today for follow-up evaluation states she feels well today.  She continues to walk most days of the week at her assisted living facility (friends home).  Her blood pressure initially today was 158 over 70s.  On recheck it is 128/74.  We reviewed the importance of heart healthy low-salt diet.  She expressed understanding.  She continues to tolerate her medications well without side effects.  She does report that she has some trouble swallowing larger pills.  I will give her a pill splitter today.  We will give  her the salty 6 diet sheet, encourage her to maintain her physical activity, and plan follow-up for 6 months.  Today she denies chest pain, shortness of breath, lower extremity edema, fatigue, palpitations, melena, hematuria, hemoptysis, diaphoresis, weakness, presyncope, syncope, orthopnea, and PND.   Home Medications    Prior to Admission medications   Medication Sig Start Date End Date Taking? Authorizing Provider  aspirin 81 MG tablet Take 81 mg by mouth daily.     [provider]  atorvastatin (LIPITOR) 40 MG tablet TAKE 1 TABLET BY MOUTH EVERY DAY 07/29/20   Lorretta Harp, MD  augmented betamethasone dipropionate (DIPROLENE-AF) 0.05 % cream Apply topically 2 (two) times daily.    [provider]  chloridazePOXIDE-amitriptyline (LIMBITROL) 5-12.5 MG per tablet Take 1 tablet by mouth daily.    [provider]  cholecalciferol (VITAMIN D) 1000 UNITS tablet Take 2,000 Units by mouth daily.    [provider]  estradiol (ESTRACE) 1 MG tablet Take 1 mg by mouth  daily.    [provider]  fluocinonide (LIDEX) 0.05 % external solution Apply 1 application topically 2 (two) times daily.     [provider]  hydrochlorothiazide (MICROZIDE) 12.5 MG capsule Take 1 capsule (12.5 mg total) by mouth daily. 09/04/20 12/03/20  Lorretta Harp, MD  hydrocortisone 2.5 % cream APPLY TO RASH/ITCHY SKIN AT BEDTIME 11/18/20   Lavonna Monarch, MD  Influenza vac split quadrivalent PF (FLUZONE HIGH-DOSE) 0.5 ML injection Fluzone High-Dose 2019-20 (PF) 180 mcg/0.5 mL intramuscular syringe  TO BE ADMINISTERED BY PHARMACIST FOR IMMUNIZATION    [provider]  isosorbide mononitrate (IMDUR) 30 MG 24 hr tablet TAKE 1 TABLET BY MOUTH EVERY DAY 03/12/20   Lorretta Harp, MD  levothyroxine (SYNTHROID, LEVOTHROID) 75 MCG tablet Take 75 mcg by mouth daily before breakfast.    [provider]  metoprolol succinate (TOPROL-XL) 100 MG 24 hr tablet TAKE 1 TABLET (100 MG TOTAL) BY MOUTH DAILY. TAKE WITH OR IMMEDIATELY FOLLOWING A MEAL. 10/28/20 01/26/21  Lorretta Harp, MD  Multiple Vitamin (MULTIVITAMIN) capsule Take 1 capsule by mouth daily.    [provider]  mupirocin ointment (BACTROBAN) 2 % Apply 1 application topically 2 (two) times daily. 10/17/20   Lavonna Monarch, MD  niacin 500 MG tablet Take 500 mg by mouth daily with breakfast.    [provider]  olmesartan (BENICAR) 40 MG tablet Take 1 tablet (40 mg total) by mouth daily. 04/08/20   Lorretta Harp, MD  pantoprazole (PROTONIX) 40 MG tablet TAKE 1 TABLET BY MOUTH EVERY DAY Patient taking differently: Take by mouth as needed.  12/05/13   Hilty, Nadean Corwin, MD  timolol (TIMOPTIC) 0.5 % ophthalmic solution Place 1 drop into the right eye every morning. 08/20/20   [provider]  TIMOLOL HEMIHYDRATE OP Place 1 drop into the right eye daily.    [provider]  VITAMIN E PO Take 400 mg by mouth daily. Plus D    [provider]    Family History     Family History  Problem Relation Age of Onset   Heart attack Mother    Heart attack Father    Cancer Sister    Diabetes Sister    Heart Problems Sister    She indicated that her mother is deceased. She indicated that her father is deceased. She indicated that her maternal grandmother is deceased. She indicated that her maternal grandfather is deceased. She indicated that her paternal grandmother is  deceased. She indicated that her paternal grandfather is deceased.  Social History    Social History   Socioeconomic History   Marital status: Widowed    Spouse name: Not on file   Number of children: 4   Years of education: 82   Highest education level: Not on file  Occupational History   Not on file  Tobacco Use   Smoking status: Never   Smokeless tobacco: Never  Substance and Sexual Activity   Alcohol use: Yes    Comment: "a drink of wine every now and then"   Drug use: Not on file   Sexual activity: Not on file  Other Topics Concern   Not on file  Social History Narrative   Not on file   Social Determinants of Health   Financial Resource Strain: Not on file  Food Insecurity: Not on file  Transportation Needs: Not on file  Physical Activity: Not on file  Stress: Not on file  Social Connections: Not on file  Intimate Partner Violence: Not on file     Review of Systems    General:  No chills, fever, night sweats or weight changes.  Cardiovascular:  No chest pain, dyspnea on exertion, edema, orthopnea, palpitations, paroxysmal nocturnal dyspnea. Dermatological: No rash, lesions/masses Respiratory: No cough, dyspnea Urologic: No hematuria, dysuria Abdominal:   No nausea, vomiting, diarrhea, bright red blood per rectum, melena, or hematemesis Neurologic:  No visual changes, wkns, changes in mental status. All other systems reviewed and are otherwise negative except as noted above.  Physical Exam    VS:  BP 128/74    Pulse 75    Ht 5\' 6"  (1.676 m)    Wt 160 lb  (72.6 kg)    SpO2 98%    BMI 25.82 kg/m  , BMI Body mass index is 25.82 kg/m. GEN: Well nourished, well developed, in no acute distress. HEENT: normal. Neck: Supple, no JVD, carotid bruits, or masses. Cardiac: RRR, no murmurs, rubs, or gallops. No clubbing, cyanosis, edema.  Radials/DP/PT 2+ and equal bilaterally.  Respiratory:  Respirations regular and unlabored, clear to auscultation bilaterally. GI: Soft, nontender, nondistended, BS + x 4. MS: no deformity or atrophy. Skin: warm and dry, no rash. Neuro:  Strength and sensation are intact. Psych: Normal affect.  Accessory Clinical Findings    Recent Labs: 07/08/2021: TSH 2.414 07/21/2021: ALT 15; BUN 26; Creatinine, Ser 1.19; Hemoglobin 10.4; Platelets 261; Potassium 4.2; Sodium 135   Recent Lipid Panel    Component Value Date/Time   CHOL 119 07/09/2021 0038   TRIG 97 07/09/2021 0038   HDL 40 (L) 07/09/2021 0038   CHOLHDL 3.0 07/09/2021 0038   VLDL 19 07/09/2021 0038   LDLCALC 60 07/09/2021 0038    ECG personally reviewed by me today-none today.  Echocardiogram 03/04/2020 IMPRESSIONS     1. Mild aortic stenosis but moderate aortic regurgitation.   2. Left ventricular ejection fraction, by estimation, is 50 to 55%. The  left ventricle has low normal function. The left ventricle has no regional  wall motion abnormalities. Left ventricular diastolic parameters are  consistent with Grade II diastolic  dysfunction (pseudonormalization).   3. Right ventricular systolic function is normal. The right ventricular  size is normal. There is mildly elevated pulmonary artery systolic  pressure.   4. The mitral valve is normal in structure. Trivial mitral valve  regurgitation. No evidence of mitral stenosis.   5. The aortic valve is tricuspid. Aortic valve regurgitation is moderate.  Mild aortic valve  stenosis.   6. The inferior vena cava is normal in size with <50% respiratory  variability, suggesting right atrial pressure of 8  mmHg.   Echocardiogram 07/09/2021 IMPRESSIONS     1. Left ventricular ejection fraction, by estimation, is 50 to 55%. The  left ventricle has low normal function. The left ventricle demonstrates  regional wall motion abnormalities with basal inferior and basal  inferolateral akinesis. There is mild left  ventricular hypertrophy. Left ventricular diastolic parameters are  consistent with Grade II diastolic dysfunction (pseudonormalization).   2. Right ventricular systolic function is normal. The right ventricular  size is normal. Tricuspid regurgitation signal is inadequate for assessing  PA pressure.   3. The mitral valve is abnormal. Mild mitral valve regurgitation. No  evidence of mitral stenosis. Moderate mitral annular calcification.   4. The aortic valve is tricuspid. Aortic valve regurgitation is mild.  Moderate aortic valve stenosis (suspect paradoxical low flow/low gradient  moderate AS). Aortic valve area, by VTI measures 1.01 cm. Aortic valve  mean gradient measures 12.0 mmHg.   5. The IVC was not visualized.   Assessment & Plan   1.Coronary artery disease-no chest pain today.  Status post NSTEMI 07/08/2021.  Echo showed normal LVEF.  Conservative management recommended.  Plavix started at that time.  Status post CABG 1997. Nuclear stress test 2013 showed no ischemia.  Heart rate today 86 bpm. Continue Plavix, aspirin, metoprolol, Imdur, Lipitor Heart healthy low-sodium diet Maintain physical activity as tolerated  Essential hypertension-BP today 128/74. Well-controlled at home. Continue metoprolol, olmesartan, Imdur, HCTZ Heart healthy low-sodium diet Maintain physical activity as tolerated  Lower extremity edema-euvolemic today. Denies recent episodes of increased lower extremity edema. Continue HCTZ Heart healthy low-sodium diet Increase physical activity as tolerated Daily weights-contact office with a weight increase of 3 pounds overnight or 5 pounds in 1  week Request labs from PCP  Hyperlipidemia-07/09/2021: Cholesterol 119; HDL 40; LDL Cholesterol 60; Triglycerides 97; VLDL 19 Continue atorvastatin Heart healthy low-sodium high-fiber diet Increase physical activity as tolerated  Mild aortic stenosis/moderate aortic insufficiency-no increased DOE or activity intolerance. Echocardiogram 7/22 showed LVEF 50-55%, moderate AS with mild aortic regurgitation.   Disposition: Follow-up with Dr. [name] or me in 6 months.  Jossie Ng. Infant Doane NP-C    01/29/2022, 2:20 PM [facility] [place] Office [phone] Fax [phone]  Notice: This dictation was prepared with Dragon dictation along with smaller phrase technology. Any transcriptional errors that result from this process are unintentional and may not be corrected upon review.  I spent 14 minutes examining this patient, reviewing medications, and using patient centered shared decision making involving her cardiac care.  Prior to her visit I spent greater than 20 minutes reviewing her past medical history,  medications, and prior cardiac tests.

## 2022-01-29 ENCOUNTER — Encounter: Payer: Self-pay | Admitting: General Practice

## 2022-01-29 ENCOUNTER — Ambulatory Visit (INDEPENDENT_AMBULATORY_CARE_PROVIDER_SITE_OTHER): Payer: Medicare Other | Admitting: General Practice

## 2022-01-29 ENCOUNTER — Other Ambulatory Visit: Payer: Self-pay

## 2022-01-29 VITALS — BP 128/74 | HR 75 | Ht 66.0 in | Wt 160.0 lb

## 2022-01-29 DIAGNOSIS — R600 Localized edema: Secondary | ICD-10-CM

## 2022-01-29 DIAGNOSIS — E785 Hyperlipidemia, unspecified: Secondary | ICD-10-CM

## 2022-01-29 DIAGNOSIS — I10 Essential (primary) hypertension: Secondary | ICD-10-CM

## 2022-01-29 DIAGNOSIS — I350 Nonrheumatic aortic (valve) stenosis: Secondary | ICD-10-CM

## 2022-01-29 DIAGNOSIS — Z951 Presence of aortocoronary bypass graft: Secondary | ICD-10-CM

## 2022-01-29 NOTE — Patient Instructions (Signed)
Medication Instructions:  The current medical regimen is effective;  continue present plan and medications as directed. Please refer to the Current Medication list given to you today.   *If you need a refill on your cardiac medications before your next appointment, please call your pharmacy*  Lab Work:   Testing/Procedures:  NONE    NONE  Special Instructions PLEASE READ AND FOLLOW SALTY 6-ATTACHED-1,800mg  daily  PLEASE MAINTAIN PHYSICAL ACTIVITY AS TOLERATED   Follow-Up: Your next appointment:  6 month(s) In Person with Quay Burow, MD   Please call our office 2 months in advance to schedule this appointment   At [facility], you and your health needs are our priority.  As part of our continuing mission to provide you with exceptional heart care, we have created designated Provider Care Teams.  These Care Teams include your primary Cardiologist (physician) and Advanced Practice Providers (APPs -  Physician Assistants and Nurse Practitioners) who all work together to provide you with the care you need, when you need it.  We recommend signing up for the patient portal called "MyChart".  Sign up information is provided on this After Visit Summary.  MyChart is used to connect with patients for Virtual Visits (Telemedicine).  Patients are able to view lab/test results, encounter notes, upcoming appointments, etc.  Non-urgent messages can be sent to your provider as well.   To learn more about what you can do with MyChart, go to [email or web address].              6 SALTY THINGS TO AVOID     1,800MG  DAILY

## 2022-01-31 ENCOUNTER — Encounter: Payer: Self-pay | Admitting: Dermatology

## 2022-01-31 NOTE — Progress Notes (Signed)
Follow-Up Visit   Subjective  Kathryn Carlson is a 88 y.o. female who presents for the following: Skin Problem (New lesion and very itchy scalp).  New crust on scalp and gross of longstanding crust under right eye Location:  Duration:  Quality:  Associated Signs/Symptoms: Modifying Factors:  Severity:  Timing: Context:   Objective  Well appearing patient in no apparent distress; mood and affect are within normal limits. Right Malar Cheek History of enlargement of longstanding flesh-colored crust below right outer eye       Mid Frontal Scalp New somewhat painful crust in area with chronic carcinoma in situ.         A focused examination was performed including head and neck.. Relevant physical exam findings are noted in the Assessment and Plan.   Assessment & Plan    Neoplasm of uncertain behavior of skin Right Malar Cheek  Skin / nail biopsy Type of biopsy: tangential   Informed consent: discussed and consent obtained   Timeout: patient name, date of birth, surgical site, and procedure verified   Procedure prep:  Patient was prepped and draped in usual sterile fashion (Non sterile) Prep type:  Chlorhexidine Anesthesia: the lesion was anesthetized in a standard fashion   Anesthetic:  1% lidocaine w/ epinephrine 1-100,000 local infiltration Instrument used: flexible razor blade   Outcome: patient tolerated procedure well   Post-procedure details: wound care instructions given    Specimen 1 - Surgical pathology Differential Diagnosis: bcc vs scc  Check Margins: No  Right cheek lesion shave biopsy done, no obvious deep involvement; base lightly cauterized.  The scalp lesion was saucerized and the base curetted and cauterized.  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  Skin / nail biopsy Type of biopsy: tangential   Informed consent: discussed and consent obtained   Timeout: patient name, date of birth, surgical site, and procedure verified    Procedure prep:  Patient was prepped and draped in usual sterile fashion (Non sterile) Prep type:  Chlorhexidine Anesthesia: the lesion was anesthetized in a standard fashion   Anesthetic:  1% lidocaine w/ epinephrine 1-100,000 local infiltration Instrument used: flexible razor blade   Outcome: patient tolerated procedure well   Post-procedure details: wound care instructions given    Destruction of lesion Complexity: simple   Destruction method: electrodesiccation and curettage   Informed consent: discussed and consent obtained   Timeout:  patient name, date of birth, surgical site, and procedure verified Anesthesia: the lesion was anesthetized in a standard fashion   Anesthetic:  1% lidocaine w/ epinephrine 1-100,000 local infiltration Curettage performed in three different directions: Yes   Electrodesiccation performed over the curetted area: Yes   Curettage cycles:  1 Lesion length (cm):  1.1 Lesion width (cm):  1.1 Margin per side (cm):  0 Final wound size (cm):  1.1 Hemostasis achieved with:  aluminum chloride Outcome: patient tolerated procedure well with no complications   Post-procedure details: wound care instructions given   Additional details:  Addendum, the biopsy returned on the scalp lesion showing for a carcinoma.  Since this is rare, I did do some investigation of prognosis.  Lesions larger than 4 cm have significant potential for metastasis.  Even small lesions such as this may spread to lymph nodes or lungs.  Although, almost certainly not patient would choose not to have Mohs surgery done for evaluation for metastatic disease, I will discuss this with her in the future.  Specimen 2 - Surgical pathology Differential Diagnosis: bcc vs  [id number]  Check Margins: No      I, Lavonna Monarch, MD, have reviewed all documentation for this visit.  The documentation on 01/31/22 for the exam, diagnosis, procedures, and orders are all accurate and complete.

## 2022-02-16 ENCOUNTER — Telehealth: Payer: Self-pay | Admitting: Cardiovascular Disease

## 2022-02-16 NOTE — Telephone Encounter (Signed)
Pt c/o medication issue:  1. Name of Medication: niacin 500 MG tablet  2. How are you currently taking this medication (dosage and times per day)? Take 500 mg by mouth daily with breakfast  3. Are you having a reaction (difficulty breathing--STAT)? no  4. What is your medication issue? Office calling to see if patient is supposed to be taking this medication

## 2022-02-16 NOTE — Telephone Encounter (Signed)
Called   [employer] .  Unable to speak to [place].  Left message  on secure voicemail.   Per last office note 01/29/22, no medication were changed, patient was taking Niacin 500 mg daily.   Any question may return call.

## 2022-02-17 NOTE — Telephone Encounter (Signed)
Called was not able to speak to [place]. Left message to call back. RN did not call patient  the message did not indicate to call patient

## 2022-02-17 NOTE — Telephone Encounter (Signed)
Spoke to patient . She has been taking medication in question . She states she did not know there was a issue . Patient states she will contact [facility]. Patient thanked RN for the call

## 2022-02-17 NOTE — Telephone Encounter (Signed)
Kathryn Carlson calling back. She states she has not heard back yet from the office and did not receive the message. Phone: [phone]

## 2022-02-20 NOTE — Telephone Encounter (Signed)
Ignacia Palma called in today and was inquiring about contradicting answers about pt's Niacin-OTC. She is asking if pt should stop or continue taking because pt states that she has gotten 2 different answers from 2 different nurses. I informed Ignacia Palma that I see the message to hold and that is all. Informed karine that Ivin Booty, Dr [name] or his nurse are all out of the office today and I am unable to ask them. I will forward this message to get an answer from one of them. Verbalized understanding.

## 2022-02-23 ENCOUNTER — Telehealth: Payer: Self-pay | Admitting: Dermatology

## 2022-02-23 MED ORDER — MUPIROCIN 2 % EX OINT: 1.0000 "application " | TOPICAL_OINTMENT | Freq: Every day | CUTANEOUS | 3 refills | Status: DC

## 2022-02-23 NOTE — Telephone Encounter (Signed)
-----   Message from Lavonna Monarch, MD sent at 02/23/2022  9:19 AM EST ----- Regarding: RE: Scalp Please let patient know that unfortunately I do not have a shampoo that is likely to correct her particular scalp itch.  She can certainly use a zinc pyrithione shampoo (there are many on the market including head and shoulders) which has antibacterial activity and sometimes helps itching. ----- Message ----- From: Lennie Odor, CMA Sent: 02/18/2022   2:13 PM EST To: Lavonna Monarch, MD Subject: Scalp                                          Phone call from patient stating that she has several lesions on her scalp that does itch, patient states that she has one lesion on her scalp that's spreading and she's using neosporin on the lesion. Patient states that she's wanting to get her hair shampooed but she's concern that the lady will not shampoo her hair unless she has a special shampoo from Dr. Denna Haggard. Patient wanted to know if there was any shampoo Dr. Denna Haggard could recommend for her to try?

## 2022-02-23 NOTE — Telephone Encounter (Signed)
Phone call to patient with Dr. Onalee Hua recommendations. Patient wanted to know if Dr. Denna Haggard had an antibiotic that he could send in to the Pharmacy for her?  Per Dr. Denna Haggard patient can try Mupirocin Ointment to see if that will help. Patient aware.

## 2022-02-23 NOTE — Telephone Encounter (Signed)
Patient called and wanted an appointment with ST for her scalp bleeding and hairloss. She states that she can not make it to the cancellation appointment that I offered her on Thursday of this week. She wants to know if ST will see her or tell her what to do about her issues.

## 2022-02-24 DIAGNOSIS — H524 Presbyopia: Secondary | ICD-10-CM | POA: Diagnosis not present

## 2022-02-24 DIAGNOSIS — I10 Essential (primary) hypertension: Secondary | ICD-10-CM | POA: Diagnosis not present

## 2022-02-24 DIAGNOSIS — E785 Hyperlipidemia, unspecified: Secondary | ICD-10-CM | POA: Diagnosis not present

## 2022-02-24 DIAGNOSIS — I129 Hypertensive chronic kidney disease with stage 1 through stage 4 chronic kidney disease, or unspecified chronic kidney disease: Secondary | ICD-10-CM | POA: Diagnosis not present

## 2022-02-24 DIAGNOSIS — H4031X2 Glaucoma secondary to eye trauma, right eye, moderate stage: Secondary | ICD-10-CM | POA: Diagnosis not present

## 2022-02-24 DIAGNOSIS — E039 Hypothyroidism, unspecified: Secondary | ICD-10-CM | POA: Diagnosis not present

## 2022-02-24 DIAGNOSIS — Z961 Presence of intraocular lens: Secondary | ICD-10-CM | POA: Diagnosis not present

## 2022-02-24 NOTE — Telephone Encounter (Signed)
Per Dr [name] :: No signif benefit from Niacin OTC 02/22/22     RN called to speak to [place]  telephone personnel took message . --left message for Ignacia Palma to call back to today extension given.

## 2022-03-11 ENCOUNTER — Ambulatory Visit: Payer: Medicare Other | Admitting: Dermatology

## 2022-03-11 ENCOUNTER — Other Ambulatory Visit: Payer: Self-pay

## 2022-03-11 ENCOUNTER — Ambulatory Visit (INDEPENDENT_AMBULATORY_CARE_PROVIDER_SITE_OTHER): Payer: Medicare Other | Admitting: Dermatology

## 2022-03-11 DIAGNOSIS — C4449 Other specified malignant neoplasm of skin of scalp and neck: Secondary | ICD-10-CM | POA: Diagnosis not present

## 2022-03-11 DIAGNOSIS — C4499 Other specified malignant neoplasm of skin, unspecified: Secondary | ICD-10-CM

## 2022-03-11 DIAGNOSIS — D044 Carcinoma in situ of skin of scalp and neck: Secondary | ICD-10-CM

## 2022-03-19 ENCOUNTER — Telehealth: Payer: Self-pay | Admitting: Dermatology

## 2022-03-19 NOTE — Telephone Encounter (Signed)
Phone call to patient to answer questions- patient wanting to know if referral was done to where dr tafeen wanted to send her.  Per note tafeen wanted patient to have [facility] notified by cma Danton Clap) that was in room with patient that tafeen wanted referral sent to [facility]. Informed patient that we will get that referral sent once dr tafeen signs the office visit. Told patient it will be atleast 3 weeks before [facility] calls her. Informed patient they are very behind- patient understood.  ?

## 2022-03-19 NOTE — Telephone Encounter (Signed)
Wants ST to call her. Monday is OK. Told her it would likely be a nurse ?

## 2022-03-19 NOTE — Progress Notes (Signed)
? ?  Follow-Up Visit ?  ?Subjective  ?Kathryn Carlson is a 88 y.o. female who presents for the following: Follow-up (Pt here to f/u on scalp. Pt states it itches a lot ). ? ?Scalp nodule has recurred ?Location:  ?Duration:  ?Quality:  ?Associated Signs/Symptoms: ?Modifying Factors:  ?Severity:  ?Timing: ?Context:  ? ?Objective  ?Well appearing patient in no apparent distress; mood and affect are within normal limits. ?Mid Frontal Scalp ?The nodule of porokeratosis has rapidly regrown and is causing her pain.  I again explained that Mohs surgery is really the best available option.  The challenge for the Mohs surgeon is that this nodule is sitting in the middle of a large patch of carcinoma in situ so getting a truly"clean" margin may take on new meaning.  We will arrange referral.  We also discussed treatment with radiation therapy but she expressed a wish to avoid this. ? ? ? ? ? ? ? ? ?A focused examination was performed including head and neck.. Relevant physical exam findings are noted in the Assessment and Plan. ? ? ?Assessment & Plan  ? ? ?Eccrine porocarcinoma of skin ?Mid Frontal Scalp ? ?REFER TO [facility] - DR. PIERCE OR DR. ALBERTINI ? ?PT DECLINED RADIATION  ? ?[facility]. [facility] ? ? ? ? ? ?I, Lavonna Monarch, MD, have reviewed all documentation for this visit.  The documentation on 03/19/22 for the exam, diagnosis, procedures, and orders are all accurate and complete. ?

## 2022-03-23 ENCOUNTER — Telehealth: Payer: Self-pay | Admitting: *Deleted

## 2022-03-23 NOTE — Telephone Encounter (Signed)
Spoke with patient on Thursday in regards to her appointment at skin surgery center. Dr tafeen closed the office note so referral sent to skin surgery center.  ?

## 2022-04-02 DIAGNOSIS — C4449 Other specified malignant neoplasm of skin of scalp and neck: Secondary | ICD-10-CM | POA: Diagnosis not present

## 2022-04-16 DIAGNOSIS — Z481 Encounter for planned postprocedural wound closure: Secondary | ICD-10-CM | POA: Diagnosis not present

## 2022-04-20 DIAGNOSIS — R1319 Other dysphagia: Secondary | ICD-10-CM | POA: Diagnosis not present

## 2022-04-20 DIAGNOSIS — I129 Hypertensive chronic kidney disease with stage 1 through stage 4 chronic kidney disease, or unspecified chronic kidney disease: Secondary | ICD-10-CM | POA: Diagnosis not present

## 2022-04-20 DIAGNOSIS — R6889 Other general symptoms and signs: Secondary | ICD-10-CM | POA: Diagnosis not present

## 2022-04-20 DIAGNOSIS — G479 Sleep disorder, unspecified: Secondary | ICD-10-CM | POA: Diagnosis not present

## 2022-05-01 DIAGNOSIS — S0180XD Unspecified open wound of other part of head, subsequent encounter: Secondary | ICD-10-CM | POA: Diagnosis not present

## 2022-05-21 DIAGNOSIS — S0100XD Unspecified open wound of scalp, subsequent encounter: Secondary | ICD-10-CM | POA: Diagnosis not present

## 2022-05-21 DIAGNOSIS — Z48817 Encounter for surgical aftercare following surgery on the skin and subcutaneous tissue: Secondary | ICD-10-CM | POA: Diagnosis not present

## 2022-05-21 DIAGNOSIS — Z85828 Personal history of other malignant neoplasm of skin: Secondary | ICD-10-CM | POA: Diagnosis not present

## 2022-06-01 DIAGNOSIS — R7301 Impaired fasting glucose: Secondary | ICD-10-CM | POA: Diagnosis not present

## 2022-06-01 DIAGNOSIS — I129 Hypertensive chronic kidney disease with stage 1 through stage 4 chronic kidney disease, or unspecified chronic kidney disease: Secondary | ICD-10-CM | POA: Diagnosis not present

## 2022-06-01 DIAGNOSIS — E785 Hyperlipidemia, unspecified: Secondary | ICD-10-CM | POA: Diagnosis not present

## 2022-06-01 DIAGNOSIS — G479 Sleep disorder, unspecified: Secondary | ICD-10-CM | POA: Diagnosis not present

## 2022-06-01 DIAGNOSIS — N1831 Chronic kidney disease, stage 3a: Secondary | ICD-10-CM | POA: Diagnosis not present

## 2022-06-01 DIAGNOSIS — D638 Anemia in other chronic diseases classified elsewhere: Secondary | ICD-10-CM | POA: Diagnosis not present

## 2022-06-01 DIAGNOSIS — R6889 Other general symptoms and signs: Secondary | ICD-10-CM | POA: Diagnosis not present

## 2022-06-01 DIAGNOSIS — E039 Hypothyroidism, unspecified: Secondary | ICD-10-CM | POA: Diagnosis not present

## 2022-06-01 DIAGNOSIS — Z0000 Encounter for general adult medical examination without abnormal findings: Secondary | ICD-10-CM | POA: Diagnosis not present

## 2022-06-01 DIAGNOSIS — K5900 Constipation, unspecified: Secondary | ICD-10-CM | POA: Diagnosis not present

## 2022-06-01 DIAGNOSIS — K219 Gastro-esophageal reflux disease without esophagitis: Secondary | ICD-10-CM | POA: Diagnosis not present

## 2022-06-01 DIAGNOSIS — R1319 Other dysphagia: Secondary | ICD-10-CM | POA: Diagnosis not present

## 2022-06-01 DIAGNOSIS — E875 Hyperkalemia: Secondary | ICD-10-CM | POA: Diagnosis not present

## 2022-06-04 DIAGNOSIS — Z85828 Personal history of other malignant neoplasm of skin: Secondary | ICD-10-CM | POA: Diagnosis not present

## 2022-06-04 DIAGNOSIS — Z48817 Encounter for surgical aftercare following surgery on the skin and subcutaneous tissue: Secondary | ICD-10-CM | POA: Diagnosis not present

## 2022-06-04 DIAGNOSIS — S0180XA Unspecified open wound of other part of head, initial encounter: Secondary | ICD-10-CM | POA: Diagnosis not present

## 2022-06-18 DIAGNOSIS — Z85828 Personal history of other malignant neoplasm of skin: Secondary | ICD-10-CM | POA: Diagnosis not present

## 2022-06-18 DIAGNOSIS — S0100XA Unspecified open wound of scalp, initial encounter: Secondary | ICD-10-CM | POA: Diagnosis not present

## 2022-06-18 DIAGNOSIS — Z48817 Encounter for surgical aftercare following surgery on the skin and subcutaneous tissue: Secondary | ICD-10-CM | POA: Diagnosis not present

## 2022-06-25 DIAGNOSIS — G4700 Insomnia, unspecified: Secondary | ICD-10-CM | POA: Diagnosis not present

## 2022-06-25 DIAGNOSIS — I129 Hypertensive chronic kidney disease with stage 1 through stage 4 chronic kidney disease, or unspecified chronic kidney disease: Secondary | ICD-10-CM | POA: Diagnosis not present

## 2022-06-25 DIAGNOSIS — E785 Hyperlipidemia, unspecified: Secondary | ICD-10-CM | POA: Diagnosis not present

## 2022-06-25 DIAGNOSIS — N1831 Chronic kidney disease, stage 3a: Secondary | ICD-10-CM | POA: Diagnosis not present

## 2022-06-26 DIAGNOSIS — I872 Venous insufficiency (chronic) (peripheral): Secondary | ICD-10-CM | POA: Diagnosis not present

## 2022-06-26 DIAGNOSIS — G479 Sleep disorder, unspecified: Secondary | ICD-10-CM | POA: Diagnosis not present

## 2022-06-26 DIAGNOSIS — M1990 Unspecified osteoarthritis, unspecified site: Secondary | ICD-10-CM | POA: Diagnosis not present

## 2022-07-05 ENCOUNTER — Other Ambulatory Visit: Payer: Self-pay | Admitting: Cardiovascular Disease

## 2022-07-06 DIAGNOSIS — Z48817 Encounter for surgical aftercare following surgery on the skin and subcutaneous tissue: Secondary | ICD-10-CM | POA: Diagnosis not present

## 2022-07-06 DIAGNOSIS — S0100XD Unspecified open wound of scalp, subsequent encounter: Secondary | ICD-10-CM | POA: Diagnosis not present

## 2022-07-06 DIAGNOSIS — Z85828 Personal history of other malignant neoplasm of skin: Secondary | ICD-10-CM | POA: Diagnosis not present

## 2022-07-17 DIAGNOSIS — M79672 Pain in left foot: Secondary | ICD-10-CM | POA: Diagnosis not present

## 2022-07-19 IMAGING — DX DG CHEST 2V
2 series · 2 of 2 positions shown · non-contrast
Comparison: 05/10/2012

CLINICAL DATA: Chest pain

EXAM:
CHEST - 2 VIEW

[chest pa]
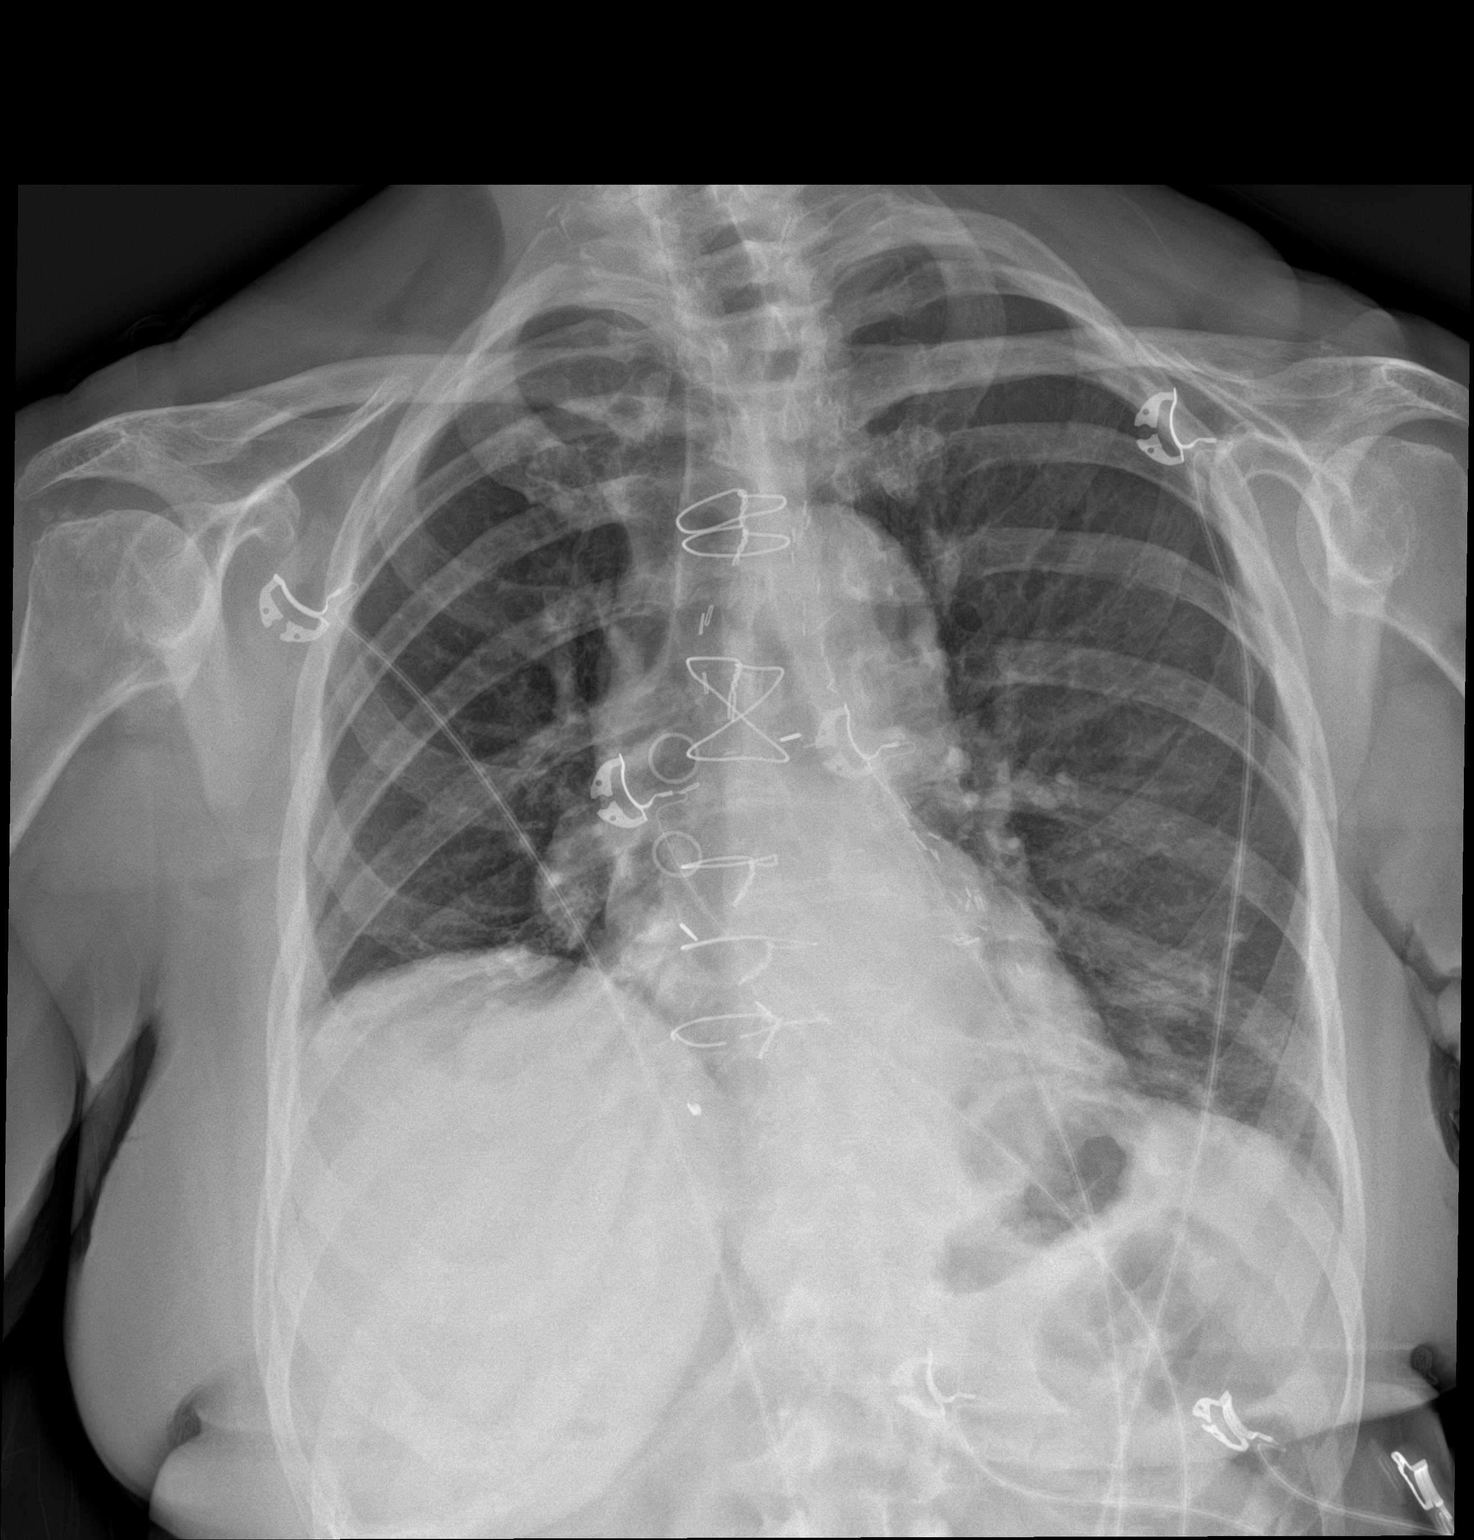

[chest lat]
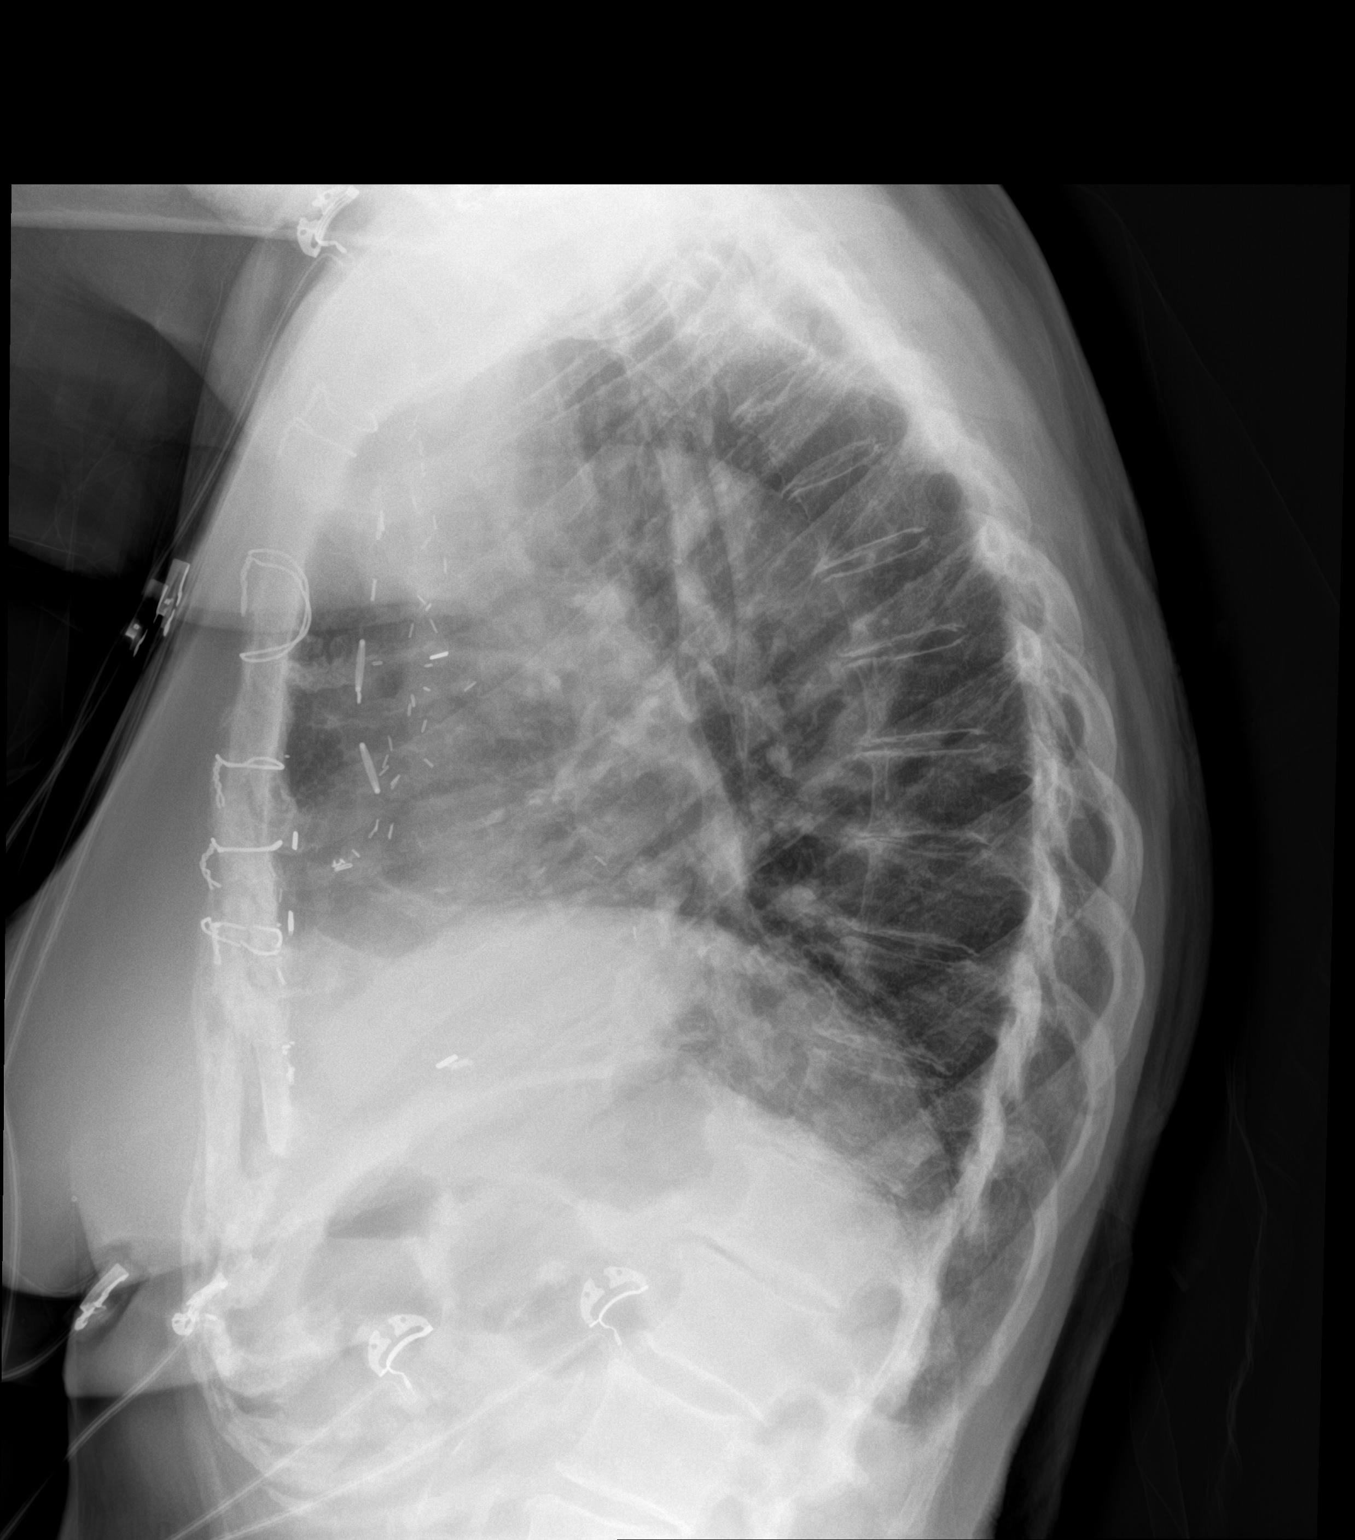

[2 of 2 positions shown; findings below may reference images not displayed]

FINDINGS: Heart is normal in size. Status post median sternotomy and CABG.
Both lungs are clear. Disc degenerative disease thoracic spine.
IMPRESSION: No acute abnormality of the lungs.

## 2022-07-23 DIAGNOSIS — Z48817 Encounter for surgical aftercare following surgery on the skin and subcutaneous tissue: Secondary | ICD-10-CM | POA: Diagnosis not present

## 2022-07-23 DIAGNOSIS — Z85828 Personal history of other malignant neoplasm of skin: Secondary | ICD-10-CM | POA: Diagnosis not present

## 2022-07-23 DIAGNOSIS — S0100XD Unspecified open wound of scalp, subsequent encounter: Secondary | ICD-10-CM | POA: Diagnosis not present

## 2022-07-31 ENCOUNTER — Encounter: Payer: Self-pay | Admitting: Cardiovascular Disease

## 2022-07-31 ENCOUNTER — Ambulatory Visit (INDEPENDENT_AMBULATORY_CARE_PROVIDER_SITE_OTHER): Payer: Medicare Other | Admitting: Cardiovascular Disease

## 2022-07-31 VITALS — BP 142/72 | HR 68 | Ht 66.0 in | Wt 158.6 lb

## 2022-07-31 DIAGNOSIS — I10 Essential (primary) hypertension: Secondary | ICD-10-CM

## 2022-07-31 DIAGNOSIS — E785 Hyperlipidemia, unspecified: Secondary | ICD-10-CM

## 2022-07-31 DIAGNOSIS — I350 Nonrheumatic aortic (valve) stenosis: Secondary | ICD-10-CM | POA: Diagnosis not present

## 2022-07-31 DIAGNOSIS — Z951 Presence of aortocoronary bypass graft: Secondary | ICD-10-CM

## 2022-07-31 NOTE — Assessment & Plan Note (Signed)
History of mild to moderate stenosis by 2D echo performed 07/09/2021.  Kathryn Carlson her aortic valve area was 1 cm with a mean gradient of 12.  Given her age, I do not think we will continue to follow this.

## 2022-07-31 NOTE — Assessment & Plan Note (Signed)
History of dyslipidemia on statin therapy with lipid profile performed 11/16/2021 revealing total cholesterol 180, LDL of 92 and HDL 44.

## 2022-07-31 NOTE — Patient Instructions (Addendum)
Medication Instructions:  No Changes In Medications at this time.  *If you need a refill on your cardiac medications before your next appointment, please call your pharmacy*  Lab Work: None Ordered At This Time.  If you have labs (blood work) drawn today and your tests are completely normal, you will receive your results only by: [facility] (if you have MyChart) OR A paper copy in the mail If you have any lab test that is abnormal or we need to change your treatment, we will call you to review the results.  Testing/Procedures: None Ordered At This Time.   Follow-Up: At [facility], you and your health needs are our priority.  As part of our continuing mission to provide you with exceptional heart care, we have created designated Provider Care Teams.  These Care Teams include your primary Cardiologist (physician) and Advanced Practice Providers (APPs -  Physician Assistants and Nurse Practitioners) who all work together to provide you with the care you need, when you need it.  Your next appointment:   6 month(s) WITH Kathryn CLEAVER, NP   AND THEN 6 MONTHS AFTER THAT   The format for your next appointment:   In Person  Provider:   Quay Burow, MD

## 2022-07-31 NOTE — Assessment & Plan Note (Addendum)
History of CAD status post coronary artery bypass grafting x3 in 1997 by Dr. Darcey Nora with a LIMA to the LAD, vein graft to intermediate and obtuse marginal branch and to the PDA and PLA sequentially.  Myoview performed in 2013 was nonischemic.  She did have a non-STEMI 07/08/2021.  Her troponins went up to 5000.  It was a joint decision not to pursue an aggressive interventional approach and she was treated medically.  She has had no recurrent symptoms.

## 2022-07-31 NOTE — Progress Notes (Signed)
07/31/2022 Kathryn Carlson   [birth year]  [id number]  Primary Physician Burnard Bunting, MD Primary Cardiologist: Lorretta Harp MD FACP, [place], [place], [place]  HPI:  Kathryn Carlson is a 88 y.o.  moderately overweight widowed Caucasian female mother of 4 children who transitioned her care from Dr. Debara Pickett to myself at her request.  She lost one of her 2 sons several years ago from dementia.  I last saw her in the office 08/05/2021.  She is accompanied by her daughter Kathryn Carlson today.  She was formally a patient of Dr. Lowella Fairy. She has a history of ischemic heart disease status post myocardial infarction at age 61. She had coronary artery bypass grafting in 1997 by Dr. Prescott Gum  Her last Myoview performed in 2013 was nonischemic. Her other Problems include symptomatic PVCs evaluated by Dr. Rayann Heman in the past as well as history of hypertension and hyperlipidemia. She denies chest pain or shortness of breath since I saw her in the office one year ago. Echo performed 01/28/18 revealed normal LV systolic function with mild aortic stenosis.   She lives independently in a assisted care facility (friend's home)..  She was admitted to [facility] 07/08/2021 for 3 days with non-STEMI.  Her symptoms were pain between her shoulder blades.  Her troponins rose to 5000.  2D echo revealed normal LV function with moderate aortic stenosis.  It was elected not to pursue an aggressive interventional approach.  She was started on clopidogrel.  Since I saw her a year ago she is remained stable.  She denies chest pain or shortness of breath.  Her most recent 2D echo performed 07/09/2021 revealed normal LV systolic function with grade 2 diastolic dysfunction and moderate aortic stenosis.   Current Meds  Medication Sig   aspirin 81 MG tablet Take 81 mg by mouth daily.    atorvastatin (LIPITOR) 40 MG tablet TAKE 1 TABLET BY MOUTH EVERY DAY   augmented betamethasone dipropionate (DIPROLENE-AF) 0.05 % cream Apply  topically 2 (two) times daily.   chloridazePOXIDE-amitriptyline (LIMBITROL) 5-12.5 MG per tablet Take 1 tablet by mouth daily.   cholecalciferol (VITAMIN D) 1000 UNITS tablet Take 2,000 Units by mouth daily.   clopidogrel (PLAVIX) 75 MG tablet TAKE 1 TABLET BY MOUTH EVERY DAY   estradiol (ESTRACE) 1 MG tablet Take 1 mg by mouth daily.   fluocinonide (LIDEX) 0.05 % external solution Apply 1 application topically 2 (two) times daily.    hydrochlorothiazide (MICROZIDE) 12.5 MG capsule hydrochlorothiazide 12.5 mg capsule   hydrocortisone 2.5 % cream APPLY TO RASH/ITCHY SKIN AT BEDTIME   Influenza vac split quadrivalent PF (FLUZONE HIGH-DOSE) 0.5 ML injection Fluzone High-Dose 2019-20 (PF) 180 mcg/0.5 mL intramuscular syringe  TO BE ADMINISTERED BY PHARMACIST FOR IMMUNIZATION   isosorbide mononitrate (IMDUR) 60 MG 24 hr tablet TAKE 1 TABLET BY MOUTH EVERY DAY   levothyroxine (SYNTHROID, LEVOTHROID) 75 MCG tablet Take 75 mcg by mouth daily before breakfast.   metoprolol succinate (TOPROL-XL) 100 MG 24 hr tablet TAKE 1 TABLET BY MOUTH DAILY. TAKE WITH OR IMMEDIATELY FOLLOWING A MEAL.   Multiple Vitamin (MULTIVITAMIN) capsule Take 1 capsule by mouth daily.   mupirocin ointment (BACTROBAN) 2 % Apply 1 application topically daily.   neomycin-polymyxin b-dexamethasone (MAXITROL) 3.5-10000-0.1 SUSP Place into the right eye.   niacin 500 MG tablet Take 500 mg by mouth daily with breakfast.   nitroGLYCERIN (NITROSTAT) 0.4 MG SL tablet Place 1 tablet (0.4 mg total) under the tongue every 5 (five) minutes  x 3 doses as needed for chest pain.   pantoprazole (PROTONIX) 40 MG tablet TAKE 1 TABLET BY MOUTH EVERY DAY (Patient taking differently: Take by mouth as needed.)   timolol (TIMOPTIC) 0.5 % ophthalmic solution Place 1 drop into the right eye every morning.   TIMOLOL HEMIHYDRATE OP Place 1 drop into the right eye daily.   VITAMIN E PO Take 400 mg by mouth daily. Plus D   Current Facility-Administered  Medications for the 07/31/22 encounter (Office Visit) with Lorretta Harp, MD  Medication   triamcinolone acetonide (KENALOG-40) injection 40 mg     Allergies  Allergen Reactions   Meloxicam     Other reaction(s): Unknown   Novocain [Procaine]     Social History   Socioeconomic History   Marital status: Widowed    Spouse name: Not on file   Number of children: 4   Years of education: 25   Highest education level: Not on file  Occupational History   Not on file  Tobacco Use   Smoking status: Never   Smokeless tobacco: Never  Substance and Sexual Activity   Alcohol use: Yes    Comment: "a drink of wine every now and then"   Drug use: Not on file   Sexual activity: Not on file  Other Topics Concern   Not on file  Social History Narrative   Not on file   Social Determinants of Health   Financial Resource Strain: Not on file  Food Insecurity: Not on file  Transportation Needs: Not on file  Physical Activity: Not on file  Stress: Not on file  Social Connections: Not on file  Intimate Partner Violence: Not on file     Review of Systems: General: negative for chills, fever, night sweats or weight changes.  Cardiovascular: negative for chest pain, dyspnea on exertion, edema, orthopnea, palpitations, paroxysmal nocturnal dyspnea or shortness of breath Dermatological: negative for rash Respiratory: negative for cough or wheezing Urologic: negative for hematuria Abdominal: negative for nausea, vomiting, diarrhea, bright red blood per rectum, melena, or hematemesis Neurologic: negative for visual changes, syncope, or dizziness All other systems reviewed and are otherwise negative except as noted above.    Blood pressure (!) 158/78, pulse 68, height '5\' 6"'$  (1.676 m), weight 158 lb 9.6 oz (71.9 kg), SpO2 97 %.  General appearance: alert and no distress Neck: no adenopathy, no JVD, supple, symmetrical, trachea midline, thyroid not enlarged, symmetric, no  tenderness/mass/nodules, and bilateral carotid bruits versus transmitted murmur Lungs: clear to auscultation bilaterally Heart: 2/6 outflow tract murmur consistent with aortic stenosis. Extremities: extremities normal, atraumatic, no cyanosis or edema Pulses: 2+ and symmetric Skin: Skin color, texture, turgor normal. No rashes or lesions Neurologic: Grossly normal  EKG sinus rhythm at 68 with LVH voltage, nonspecific ST and T wave changes and occasional PVCs.  I personally reviewed this EKG.  ASSESSMENT AND PLAN:   HTN (hypertension) History of essential hypertension a blood pressure measured today at 158/78.  She is on hydrochlorothiazide and metoprolol.  Dyslipidemia History of dyslipidemia on statin therapy with lipid profile performed 11/16/2021 revealing total cholesterol 180, LDL of 92 and HDL 44.  S/P CABG x 3 History of CAD status post coronary artery bypass grafting x3 in 1997 by Dr. Darcey Nora with a LIMA to the LAD, vein graft to intermediate and obtuse marginal branch and to the PDA and PLA sequentially.  Myoview performed in 2013 was nonischemic.  She did have a non-STEMI 07/08/2021.  Her troponins went up  to 5000.  It was a joint decision not to pursue an aggressive interventional approach and she was treated medically.  She has had no recurrent symptoms.  Aortic stenosis, mild History of mild to moderate stenosis by 2D echo performed 07/09/2021.  Knox Holdman her aortic valve area was 1 cm with a mean gradient of 12.  Given her age, I do not think we will continue to follow this.     Lorretta Harp MD FACP,FACC,FAHA, [facility] 07/31/2022 1:48 PM

## 2022-07-31 NOTE — Assessment & Plan Note (Signed)
History of essential hypertension a blood pressure measured today at 158/78.  She is on hydrochlorothiazide and metoprolol.

## 2022-08-10 DIAGNOSIS — L98499 Non-pressure chronic ulcer of skin of other sites with unspecified severity: Secondary | ICD-10-CM | POA: Diagnosis not present

## 2022-08-13 ENCOUNTER — Telehealth: Payer: Self-pay | Admitting: Cardiovascular Disease

## 2022-08-13 NOTE — Telephone Encounter (Signed)
Spoke with pt regarding her medication list. Reviewed medications with pt per our list and the medications that Dr. [name] prescribes for her. Pt confirms that she is taking all medications as prescribed. Pt has also put in a call to Dr. Jacquiline Doe office to review those medications as well. Pt verbalizes understanding and is very thankful.

## 2022-08-13 NOTE — Telephone Encounter (Signed)
Pt called requesting to speak to nurse about her medication regimen.

## 2022-09-03 DIAGNOSIS — L98499 Non-pressure chronic ulcer of skin of other sites with unspecified severity: Secondary | ICD-10-CM | POA: Diagnosis not present

## 2022-09-04 DIAGNOSIS — H52203 Unspecified astigmatism, bilateral: Secondary | ICD-10-CM | POA: Diagnosis not present

## 2022-09-04 DIAGNOSIS — H4720 Unspecified optic atrophy: Secondary | ICD-10-CM | POA: Diagnosis not present

## 2022-09-17 DIAGNOSIS — K219 Gastro-esophageal reflux disease without esophagitis: Secondary | ICD-10-CM | POA: Diagnosis not present

## 2022-09-17 DIAGNOSIS — K5900 Constipation, unspecified: Secondary | ICD-10-CM | POA: Diagnosis not present

## 2022-09-17 DIAGNOSIS — D638 Anemia in other chronic diseases classified elsewhere: Secondary | ICD-10-CM | POA: Diagnosis not present

## 2022-09-17 DIAGNOSIS — R531 Weakness: Secondary | ICD-10-CM | POA: Diagnosis not present

## 2022-09-17 DIAGNOSIS — R350 Frequency of micturition: Secondary | ICD-10-CM | POA: Diagnosis not present

## 2022-09-25 DIAGNOSIS — H353131 Nonexudative age-related macular degeneration, bilateral, early dry stage: Secondary | ICD-10-CM | POA: Diagnosis not present

## 2022-09-25 DIAGNOSIS — H4720 Unspecified optic atrophy: Secondary | ICD-10-CM | POA: Diagnosis not present

## 2022-09-29 DIAGNOSIS — S0100XA Unspecified open wound of scalp, initial encounter: Secondary | ICD-10-CM | POA: Diagnosis not present

## 2022-10-01 ENCOUNTER — Other Ambulatory Visit: Payer: Self-pay | Admitting: Cardiovascular Disease

## 2022-10-08 DIAGNOSIS — Z48817 Encounter for surgical aftercare following surgery on the skin and subcutaneous tissue: Secondary | ICD-10-CM | POA: Diagnosis not present

## 2022-10-13 DIAGNOSIS — Z23 Encounter for immunization: Secondary | ICD-10-CM | POA: Diagnosis not present

## 2022-10-14 ENCOUNTER — Other Ambulatory Visit: Payer: Self-pay | Admitting: Cardiovascular Disease

## 2022-11-13 DIAGNOSIS — M7062 Trochanteric bursitis, left hip: Secondary | ICD-10-CM | POA: Diagnosis not present

## 2022-11-13 DIAGNOSIS — M25552 Pain in left hip: Secondary | ICD-10-CM | POA: Diagnosis not present

## 2022-11-13 DIAGNOSIS — M533 Sacrococcygeal disorders, not elsewhere classified: Secondary | ICD-10-CM | POA: Diagnosis not present

## 2022-11-13 DIAGNOSIS — M259 Joint disorder, unspecified: Secondary | ICD-10-CM | POA: Diagnosis not present

## 2022-11-26 DIAGNOSIS — Z48817 Encounter for surgical aftercare following surgery on the skin and subcutaneous tissue: Secondary | ICD-10-CM | POA: Diagnosis not present

## 2022-12-03 DIAGNOSIS — Z48817 Encounter for surgical aftercare following surgery on the skin and subcutaneous tissue: Secondary | ICD-10-CM | POA: Diagnosis not present

## 2022-12-07 DIAGNOSIS — I129 Hypertensive chronic kidney disease with stage 1 through stage 4 chronic kidney disease, or unspecified chronic kidney disease: Secondary | ICD-10-CM | POA: Diagnosis not present

## 2022-12-07 DIAGNOSIS — M4000 Postural kyphosis, site unspecified: Secondary | ICD-10-CM | POA: Diagnosis not present

## 2022-12-07 DIAGNOSIS — R269 Unspecified abnormalities of gait and mobility: Secondary | ICD-10-CM | POA: Diagnosis not present

## 2022-12-07 DIAGNOSIS — R7301 Impaired fasting glucose: Secondary | ICD-10-CM | POA: Diagnosis not present

## 2022-12-07 DIAGNOSIS — N1831 Chronic kidney disease, stage 3a: Secondary | ICD-10-CM | POA: Diagnosis not present

## 2022-12-07 DIAGNOSIS — E669 Obesity, unspecified: Secondary | ICD-10-CM | POA: Diagnosis not present

## 2022-12-15 ENCOUNTER — Other Ambulatory Visit: Payer: Self-pay | Admitting: Physician Assistant

## 2022-12-15 DIAGNOSIS — M259 Joint disorder, unspecified: Secondary | ICD-10-CM

## 2022-12-31 DIAGNOSIS — Z48817 Encounter for surgical aftercare following surgery on the skin and subcutaneous tissue: Secondary | ICD-10-CM | POA: Diagnosis not present

## 2023-01-21 DIAGNOSIS — H04123 Dry eye syndrome of bilateral lacrimal glands: Secondary | ICD-10-CM | POA: Diagnosis not present

## 2023-01-21 DIAGNOSIS — H10413 Chronic giant papillary conjunctivitis, bilateral: Secondary | ICD-10-CM | POA: Diagnosis not present

## 2023-01-28 DIAGNOSIS — S0100XD Unspecified open wound of scalp, subsequent encounter: Secondary | ICD-10-CM | POA: Diagnosis not present

## 2023-01-28 DIAGNOSIS — Z85828 Personal history of other malignant neoplasm of skin: Secondary | ICD-10-CM | POA: Diagnosis not present

## 2023-01-28 DIAGNOSIS — Z48817 Encounter for surgical aftercare following surgery on the skin and subcutaneous tissue: Secondary | ICD-10-CM | POA: Diagnosis not present

## 2023-02-01 NOTE — Progress Notes (Unsigned)
Cardiology Clinic Note   Patient Name: CLOTILDA HAFER Date of Encounter: 02/03/2023  Primary Care Provider:  Burnard Bunting, MD Primary Cardiologist:  Quay Burow, MD  Patient Profile    Aletha Halim. Canty 89 year old female presents the clinic today for follow-up evaluation of her hypertension and PVCs.  Past Medical History    Past Medical History:  Diagnosis Date   BCC (basal cell carcinoma of skin) 01/09/2009   Lower Central Back (tx p bx)   CAD (coronary artery disease)    possible ant wall MI ('97) - cath & CABG x5   Exogenous obesity    GERD (gastroesophageal reflux disease)    History of nuclear stress test 09/2012   lexiscan; mild perfusion defect in apical anterior & apical region (infarct/scar) - no significant ischemia, low risk    History of total bilateral knee replacement    Hypertension    Hypothyroidism    Nodular basal cell carcinoma (BCC) 08/17/2017   Left Calf (tx p bx)   PVC's (premature ventricular contractions)    SCC (squamous cell carcinoma) [place] 01/29/2004   Right Forearm (Cx3,5FU)   SCCA (squamous cell carcinoma) of skin 03/13/2005   Mid Upper Back   SCCA (squamous cell carcinoma) of skin 09/07/2005   Left Elbow(Cx3,5FU)   SCCA (squamous cell carcinoma) of skin 01/25/2006   Left Brow(in situ) (Cx3,5FU)   SCCA (squamous cell carcinoma) of skin 04/27/2006   Inner Left Shoulder(Keratoacanthoma) (Exc.)   SCCA (squamous cell carcinoma) of skin 03/22/2007   Left Temple(in situ) and Bridge of Nose(in situ) (Cs3,5FU)   SCCA (squamous cell carcinoma) of skin 05/12/2007   Top of Scalp(Cx3,5FU)   SCCA (squamous cell carcinoma) of skin 01/28/2011   Right Upper Arm(in situ)   SCCA (squamous cell carcinoma) of skin 07/26/2012   Glabella, Inferior Tip (Cx3,5FU)   SCCA (squamous cell carcinoma) of skin 03/08/2013   Left Front Scalp(in situ) and Upper Nose(in situ) (Cx3,5FU)   SCCA (squamous cell carcinoma) of skin 07/24/2013   Right Lower  Leg(Keratoacanthoma)   SCCA (squamous cell carcinoma) of skin 09/11/2015   Left Scalp(in situ) (Cx3,5FU)   SCCA (squamous cell carcinoma) of skin 01/15/2016   Left Forearm(in situ) and Left Temple(in situ) (tx p bx)   SCCA (squamous cell carcinoma) of skin 10/01/2016   Left Mid Forearm(in situ)(tx p bx) and Left Front Scalp(watch)   SCCA (squamous cell carcinoma) of skin 11/12/2016   Left Temple(Keratoacanthoma) (watch)   SCCA (squamous cell carcinoma) of skin 02/11/2017   Top Left Hand(Keratoacanthoma) (tx p bx)   Squamous cell carcinoma in situ (SCCIS) 11/18/1999   Above Left Outer Eyebrow   Squamous cell carcinoma of scalp    removed - Dr. Denna Haggard   Superficial basal cell carcinoma (BCC) 07/14/2004   Left Scapula (Cx3,5FU)   Past Surgical History:  Procedure Laterality Date   ABDOMINAL HYSTERECTOMY  1984   APPENDECTOMY  1941   Carotid Doppler  12/07/2012   R & L ICA - 0-49% diameter reduction   CORONARY ARTERY BYPASS GRAFT  09/1996   cath & CABG x5 LIMA-LAD, SVG-sequential OD & OM, SVG sequential to PDA & PLA (Dr. Prescott Gum)   REPLACEMENT TOTAL KNEE Bilateral 2001 & 2003   TONSILLECTOMY     TRANSTHORACIC ECHOCARDIOGRAM  08/23/2013   EF 50-55%, LV cavity size mod reduced, mild LVH, mild conc hypertrophy; mild AV stenosis & mild regurg; mild MR - ordered for murmur    Allergies  Allergies  Allergen Reactions  Meloxicam     Other reaction(s): Unknown   Novocain [Procaine]     History of Present Illness    Ms. Marchetti has a PMH of hypertension, NSVT, frequent PVCs, aortic stenosis, coronary artery disease status post CABG x 3 1997, dyslipidemia, symptomatic bradycardia, HLD, and lower extremity edema. Echocardiogram 03/04/2020 showed mild aortic stenosis, moderate aortic regurgitation, 50-55% LVEF, and G2 DD.  She is a former patient of Dr. Rollene Fare and Dr. Debara Pickett. She is now followed by Dr. [name]. She had an MI at age 55. Underwent nuclear stress test 2013 which showed no  ischemia. She was last seen by Dr. [name] on 3/21 during that time she was doing well.  She contacted the nurse triage line on 09/03/20 with concerns about swelling in her lower extremities. She was started on HCTZ 12.5 mg daily. She was seen in this clinic by Sande Rives, PA-C on 10/02/2020. During that time she was noted to have continued lower extremity edema. Her creatinine was stable. She did have some increased dyspnea with exertion that was felt to be related to deconditioning. She denied orthopnea or PND. Her weight continues to be stable from a previous visit. She denied palpitations, lightheadedness, dizziness. She did report some numbness and bilateral feet which she attributed to neuropathy. Her main complaint during the visit was related to her memory loss. She reported that this was not a new occurrence and she had noted increased forgetfulness over the last several years. She was instructed to follow-up with her PCP.  She presented to the clinic 01/28/21 for follow-up evaluation stated she was having trouble sleeping and had a poor appetite.  She had been trying to find low-dose melatonin that was suggested by her PCP.  However, she was only  able to find 10 mg dosing at the store.  I instructed her to look in the children section.  She had been supplementing her diet with boost supplement drinks.  She stated that she had lost around 16 pounds.  She reported that she  had cravings for food however when the food arrived she did not have any desire to eat.  She remained physically active walking at friend's home daily.  She noticed brief occasional episodes of what she described as "flutters".  She reported they lasted for only seconds and then dissipated on their own.  She reported that she had labs drawn recently at Dr. Jacquiline Doe office, we will requested.  I recommended that she increase her physical activity as tolerated, continue her heart healthy diet, and follow-up in 6 months.  She was seen  by Dr. [name] in follow-up on 08/05/2021.  During that time her admission to [facility] 07/08/2021 for 3 days with NSTEMI was reviewed.  Her symptoms included pain between her shoulder blades.  Her troponins rose to 5000.  Echocardiogram showed normal LV function and moderate aortic stenosis.  Conservative management was pursued.  She was started on Plavix and denied further symptoms at her follow-up visit.  She presented to the clinic 01/29/2022 for follow-up evaluation stated she felt well .  She continued to walk most days of the week at her assisted living facility (friends home).  Her blood pressure initially  was 158 over 70s.  On recheck it was 128/74.  We reviewed the importance of heart healthy low-salt diet.  She expressed understanding.  She continued to tolerate her medications well without side effects.  She did report that she has some trouble swallowing larger pills.  I  gave her a pill splitter today.  We  planned follow-up for 6 months.  She was seen in follow-up by Dr. [name] on 07/31/2022.  During that time she continued to be stable.  She denied chest pain shortness of breath.  Her echocardiogram 07/09/2021 showed normal LV function with G2 DD and moderate aortic stenosis.  She presents to the clinic today for follow-up evaluation states she continues to live independently at a friend's home.  She is able to take her own medications, make her walk for her meals.  She is a little bit frustrated that her weight is up 3 pounds.  We reviewed healthy weights.  I encouraged her to continue her physical activity.  Her blood pressure initially today is 153/92 and on recheck is 158/72.  Her blood pressure is well-controlled at home.  I requested that she send [place] a 2-week blood pressure log.  I will continue her current medication regimen and plan follow-up in 6 months.  Today she denies chest pain, shortness of breath, lower extremity edema, fatigue, palpitations, melena, hematuria, hemoptysis,  diaphoresis, weakness, presyncope, syncope, orthopnea, and PND.     Home Medications    Prior to Admission medications   Medication Sig Start Date End Date Taking? Authorizing Provider  aspirin 81 MG tablet Take 81 mg by mouth daily.     [provider]  atorvastatin (LIPITOR) 40 MG tablet TAKE 1 TABLET BY MOUTH EVERY DAY 07/29/20   Lorretta Harp, MD  augmented betamethasone dipropionate (DIPROLENE-AF) 0.05 % cream Apply topically 2 (two) times daily.    [provider]  chloridazePOXIDE-amitriptyline (LIMBITROL) 5-12.5 MG per tablet Take 1 tablet by mouth daily.    [provider]  cholecalciferol (VITAMIN D) 1000 UNITS tablet Take 2,000 Units by mouth daily.    [provider]  estradiol (ESTRACE) 1 MG tablet Take 1 mg by mouth daily.    [provider]  fluocinonide (LIDEX) 0.05 % external solution Apply 1 application topically 2 (two) times daily.     [provider]  hydrochlorothiazide (MICROZIDE) 12.5 MG capsule Take 1 capsule (12.5 mg total) by mouth daily. 09/04/20 12/03/20  Lorretta Harp, MD  hydrocortisone 2.5 % cream APPLY TO RASH/ITCHY SKIN AT BEDTIME 11/18/20   Lavonna Monarch, MD  Influenza vac split quadrivalent PF (FLUZONE HIGH-DOSE) 0.5 ML injection Fluzone High-Dose 2019-20 (PF) 180 mcg/0.5 mL intramuscular syringe  TO BE ADMINISTERED BY PHARMACIST FOR IMMUNIZATION    [provider]  isosorbide mononitrate (IMDUR) 30 MG 24 hr tablet TAKE 1 TABLET BY MOUTH EVERY DAY 03/12/20   Lorretta Harp, MD  levothyroxine (SYNTHROID, LEVOTHROID) 75 MCG tablet Take 75 mcg by mouth daily before breakfast.    [provider]  metoprolol succinate (TOPROL-XL) 100 MG 24 hr tablet TAKE 1 TABLET (100 MG TOTAL) BY MOUTH DAILY. TAKE WITH OR IMMEDIATELY FOLLOWING A MEAL. 10/28/20 01/26/21  Lorretta Harp, MD  Multiple Vitamin (MULTIVITAMIN) capsule Take 1 capsule by mouth daily.    [provider]  mupirocin  ointment (BACTROBAN) 2 % Apply 1 application topically 2 (two) times daily. 10/17/20   Lavonna Monarch, MD  niacin 500 MG tablet Take 500 mg by mouth daily with breakfast.    [provider]  olmesartan (BENICAR) 40 MG tablet Take 1 tablet (40 mg total) by mouth daily. 04/08/20   Lorretta Harp, MD  pantoprazole (PROTONIX) 40 MG tablet TAKE 1 TABLET BY MOUTH EVERY DAY Patient taking differently: Take by mouth as  needed.  12/05/13   Hilty, Nadean Corwin, MD  timolol (TIMOPTIC) 0.5 % ophthalmic solution Place 1 drop into the right eye every morning. 08/20/20   [provider]  TIMOLOL HEMIHYDRATE OP Place 1 drop into the right eye daily.    [provider]  VITAMIN E PO Take 400 mg by mouth daily. Plus D    [provider]    Family History    Family History  Problem Relation Age of Onset   Heart attack Mother    Heart attack Father    Cancer Sister    Diabetes Sister    Heart Problems Sister    She indicated that her mother is deceased. She indicated that her father is deceased. She indicated that her maternal grandmother is deceased. She indicated that her maternal grandfather is deceased. She indicated that her paternal grandmother is deceased. She indicated that her paternal grandfather is deceased.  Social History    Social History   Socioeconomic History   Marital status: Widowed    Spouse name: Not on file   Number of children: 4   Years of education: 34   Highest education level: Not on file  Occupational History   Not on file  Tobacco Use   Smoking status: Never   Smokeless tobacco: Never  Substance and Sexual Activity   Alcohol use: Yes    Comment: "a drink of wine every now and then"   Drug use: Not on file   Sexual activity: Not on file  Other Topics Concern   Not on file  Social History Narrative   Not on file   Social Determinants of Health   Financial Resource Strain: Not on file  Food Insecurity: Not on file   Transportation Needs: Not on file  Physical Activity: Not on file  Stress: Not on file  Social Connections: Not on file  Intimate Partner Violence: Not on file     Review of Systems    General:  No chills, fever, night sweats or weight changes.  Cardiovascular:  No chest pain, dyspnea on exertion, edema, orthopnea, palpitations, paroxysmal nocturnal dyspnea. Dermatological: No rash, lesions/masses Respiratory: No cough, dyspnea Urologic: No hematuria, dysuria Abdominal:   No nausea, vomiting, diarrhea, bright red blood per rectum, melena, or hematemesis Neurologic:  No visual changes, wkns, changes in mental status. All other systems reviewed and are otherwise negative except as noted above.  Physical Exam    VS:  BP (!) 158/72   Pulse 72   Ht '5\' 4"'$  (1.626 m)   Wt 161 lb (73 kg)   SpO2 98%   BMI 27.64 kg/m  , BMI Body mass index is 27.64 kg/m. GEN: Well nourished, well developed, in no acute distress. HEENT: normal. Neck: Supple, no JVD, carotid bruits, or masses. Cardiac: RRR, 2/6 systolic murmur heard along right sternal border.  , rubs, or gallops. No clubbing, cyanosis, edema.  Radials/DP/PT 2+ and equal bilaterally.  Respiratory:  Respirations regular and unlabored, clear to auscultation bilaterally. GI: Soft, nontender, nondistended, BS + x 4. MS: no deformity or atrophy. Skin: warm and dry, no rash. Neuro:  Strength and sensation are intact. Psych: Normal affect.  Accessory Clinical Findings    Recent Labs: No results found for requested labs within last 365 days.   Recent Lipid Panel    Component Value Date/Time   CHOL 119 07/09/2021 0038   TRIG 97 07/09/2021 0038   HDL 40 (L) 07/09/2021 0038   CHOLHDL 3.0 07/09/2021 0038  VLDL 19 07/09/2021 0038   LDLCALC 60 07/09/2021 0038    ECG personally reviewed by me today-none today.  Echocardiogram 03/04/2020 IMPRESSIONS     1. Mild aortic stenosis but moderate aortic regurgitation.   2. Left ventricular  ejection fraction, by estimation, is 50 to 55%. The  left ventricle has low normal function. The left ventricle has no regional  wall motion abnormalities. Left ventricular diastolic parameters are  consistent with Grade II diastolic  dysfunction (pseudonormalization).   3. Right ventricular systolic function is normal. The right ventricular  size is normal. There is mildly elevated pulmonary artery systolic  pressure.   4. The mitral valve is normal in structure. Trivial mitral valve  regurgitation. No evidence of mitral stenosis.   5. The aortic valve is tricuspid. Aortic valve regurgitation is moderate.  Mild aortic valve stenosis.   6. The inferior vena cava is normal in size with <50% respiratory  variability, suggesting right atrial pressure of 8 mmHg.   Echocardiogram 07/09/2021 IMPRESSIONS     1. Left ventricular ejection fraction, by estimation, is 50 to 55%. The  left ventricle has low normal function. The left ventricle demonstrates  regional wall motion abnormalities with basal inferior and basal  inferolateral akinesis. There is mild left  ventricular hypertrophy. Left ventricular diastolic parameters are  consistent with Grade II diastolic dysfunction (pseudonormalization).   2. Right ventricular systolic function is normal. The right ventricular  size is normal. Tricuspid regurgitation signal is inadequate for assessing  PA pressure.   3. The mitral valve is abnormal. Mild mitral valve regurgitation. No  evidence of mitral stenosis. Moderate mitral annular calcification.   4. The aortic valve is tricuspid. Aortic valve regurgitation is mild.  Moderate aortic valve stenosis (suspect paradoxical low flow/low gradient  moderate AS). Aortic valve area, by VTI measures 1.01 cm. Aortic valve  mean gradient measures 12.0 mmHg.   5. The IVC was not visualized.   Assessment & Plan   1.Coronary artery disease-no recent episodes of chest discomfort.  Status post NSTEMI  07/08/2021.  Echo showed normal LVEF.  Conservative management recommended.  Plavix started at that time.  Status post CABGx3 in 1997. Nuclear stress test 2013 showed no ischemia.  Heart rate today 86 bpm.  Remains somewhat physically active.  Denies exertional chest discomfort. Continue Plavix, aspirin, metoprolol, Imdur, Lipitor Heart healthy low-sodium diet-reviewed  Mild aortic stenosis/moderate aortic insufficiency-no increased DOE or activity intolerance. Echocardiogram 7/22 showed LVEF 50-55%, moderate AS with mild aortic regurgitation.  Essential hypertension-BP today initially 153/92 and on recheck 138/72.  Her blood pressure is well-controlled at home.  Will have her send [place] a 2-week blood pressure log. Continue metoprolol, olmesartan, Imdur, HCTZ Heart healthy low-sodium diet Maintain physical activity as tolerated  Lower extremity edema-no lower extremity swelling. Denies recent edema. Continue HCTZ Heart healthy low-sodium diet Increase physical activity as tolerated Daily weights-contact office with a weight increase of 3 pounds overnight or 5 pounds in 1 week Request labs from PCP  Hyperlipidemia-LDL 92 on 11/24/21 Continue atorvastatin Heart healthy low-sodium high-fiber diet Increase physical activity as tolerated   Disposition: Follow-up with Dr. [name] or me in 6 months.  Jossie Ng. Levonia Wolfley NP-C    02/03/2023, 4:09 PM [facility] [address] Office [phone] Fax [phone]  Notice: This dictation was prepared with Dragon dictation along with smaller phrase technology. Any transcriptional errors that result from this process are unintentional and may not be corrected upon review.  I  spent 14 minutes examining this patient, reviewing medications, and using patient centered shared decision making involving her cardiac care.  Prior to her visit I spent greater than 20 minutes reviewing her past medical history,  medications,  and prior cardiac tests.

## 2023-02-03 ENCOUNTER — Ambulatory Visit: Payer: Medicare Other | Attending: General Practice | Admitting: General Practice

## 2023-02-03 ENCOUNTER — Encounter: Payer: Self-pay | Admitting: General Practice

## 2023-02-03 VITALS — BP 158/72 | HR 72 | Ht 64.0 in | Wt 161.0 lb

## 2023-02-03 DIAGNOSIS — R600 Localized edema: Secondary | ICD-10-CM | POA: Diagnosis not present

## 2023-02-03 DIAGNOSIS — Z951 Presence of aortocoronary bypass graft: Secondary | ICD-10-CM | POA: Diagnosis not present

## 2023-02-03 DIAGNOSIS — I350 Nonrheumatic aortic (valve) stenosis: Secondary | ICD-10-CM | POA: Insufficient documentation

## 2023-02-03 DIAGNOSIS — E785 Hyperlipidemia, unspecified: Secondary | ICD-10-CM | POA: Insufficient documentation

## 2023-02-03 DIAGNOSIS — I10 Essential (primary) hypertension: Secondary | ICD-10-CM

## 2023-02-03 NOTE — Patient Instructions (Signed)
Medication Instructions:   Your physician recommends that you continue on your current medications as directed. Please refer to the Current Medication list given to you today.  *If you need a refill on your cardiac medications before your next appointment, please call your pharmacy*   Lab Work: [place]   If you have labs (blood work) drawn today and your tests are completely normal, you will receive your results only by: [place] (if you have MyChart) OR A paper copy in the mail If you have any lab test that is abnormal or we need to change your treatment, we will call you to review the results.   Testing/Procedures: NONE ORDERED  TODAY     Follow-Up: At [facility], you and your health needs are our priority.  As part of our continuing mission to provide you with exceptional heart care, we have created designated Provider Care Teams.  These Care Teams include your primary Cardiologist (physician) and Advanced Practice Providers (APPs -  Physician Assistants and Nurse Practitioners) who all work together to provide you with the care you need, when you need it.  We recommend signing up for the patient portal called "MyChart".  Sign up information is provided on this After Visit Summary.  MyChart is used to connect with patients for Virtual Visits (Telemedicine).  Patients are able to view lab/test results, encounter notes, upcoming appointments, etc.  Non-urgent messages can be sent to your provider as well.   To learn more about what you can do with MyChart, go to [email or web address].    Your next appointment:   6 month(s)  Provider:   Quay Burow, MD     Other Instructions

## 2023-03-11 DIAGNOSIS — S0100XD Unspecified open wound of scalp, subsequent encounter: Secondary | ICD-10-CM | POA: Diagnosis not present

## 2023-03-11 DIAGNOSIS — Z85828 Personal history of other malignant neoplasm of skin: Secondary | ICD-10-CM | POA: Diagnosis not present

## 2023-03-11 DIAGNOSIS — Z48817 Encounter for surgical aftercare following surgery on the skin and subcutaneous tissue: Secondary | ICD-10-CM | POA: Diagnosis not present

## 2023-04-05 ENCOUNTER — Telehealth: Payer: Self-pay | Admitting: Cardiovascular Disease

## 2023-04-05 NOTE — Telephone Encounter (Signed)
Patient states she feels a difference in her strength recently and she would like to call back to discuss.

## 2023-04-08 DIAGNOSIS — L98499 Non-pressure chronic ulcer of skin of other sites with unspecified severity: Secondary | ICD-10-CM | POA: Diagnosis not present

## 2023-04-14 NOTE — Progress Notes (Deleted)
Cardiology Clinic Note   Patient Name: Kathryn Carlson Date of Encounter: 04/14/2023  Primary Care Provider:  Geoffry Paradise, MD Primary Cardiologist:  Nanetta Batty, MD  Patient Profile    Kathryn Carlson 89 year old female presents the clinic today for follow-up evaluation of her weakness and to review medications.  Past Medical History    Past Medical History:  Diagnosis Date   BCC (basal cell carcinoma of skin) 01/09/2009   Lower Central Back (tx p bx)   CAD (coronary artery disease)    possible ant wall MI ('97) - cath & CABG x5   Exogenous obesity    GERD (gastroesophageal reflux disease)    History of nuclear stress test 09/2012   lexiscan; mild perfusion defect in apical anterior & apical region (infarct/scar) - no significant ischemia, low risk    History of total bilateral knee replacement    Hypertension    Hypothyroidism    Nodular basal cell carcinoma (BCC) 08/17/2017   Left Calf (tx p bx)   PVC's (premature ventricular contractions)    SCC (squamous cell carcinoma) [place] 01/29/2004   Right Forearm (Cx3,5FU)   SCCA (squamous cell carcinoma) of skin 03/13/2005   Mid Upper Back   SCCA (squamous cell carcinoma) of skin 09/07/2005   Left Elbow(Cx3,5FU)   SCCA (squamous cell carcinoma) of skin 01/25/2006   Left Brow(in situ) (Cx3,5FU)   SCCA (squamous cell carcinoma) of skin 04/27/2006   Inner Left Shoulder(Keratoacanthoma) (Exc.)   SCCA (squamous cell carcinoma) of skin 03/22/2007   Left Temple(in situ) and Bridge of Nose(in situ) (Cs3,5FU)   SCCA (squamous cell carcinoma) of skin 05/12/2007   Top of Scalp(Cx3,5FU)   SCCA (squamous cell carcinoma) of skin 01/28/2011   Right Upper Arm(in situ)   SCCA (squamous cell carcinoma) of skin 07/26/2012   Glabella, Inferior Tip (Cx3,5FU)   SCCA (squamous cell carcinoma) of skin 03/08/2013   Left Front Scalp(in situ) and Upper Nose(in situ) (Cx3,5FU)   SCCA (squamous cell carcinoma) of skin 07/24/2013    Right Lower Leg(Keratoacanthoma)   SCCA (squamous cell carcinoma) of skin 09/11/2015   Left Scalp(in situ) (Cx3,5FU)   SCCA (squamous cell carcinoma) of skin 01/15/2016   Left Forearm(in situ) and Left Temple(in situ) (tx p bx)   SCCA (squamous cell carcinoma) of skin 10/01/2016   Left Mid Forearm(in situ)(tx p bx) and Left Front Scalp(watch)   SCCA (squamous cell carcinoma) of skin 11/12/2016   Left Temple(Keratoacanthoma) (watch)   SCCA (squamous cell carcinoma) of skin 02/11/2017   Top Left Hand(Keratoacanthoma) (tx p bx)   Squamous cell carcinoma in situ (SCCIS) 11/18/1999   Above Left Outer Eyebrow   Squamous cell carcinoma of scalp    removed - Dr. Jorja Loa   Superficial basal cell carcinoma (BCC) 07/14/2004   Left Scapula (Cx3,5FU)   Past Surgical History:  Procedure Laterality Date   ABDOMINAL HYSTERECTOMY  1984   APPENDECTOMY  1941   Carotid Doppler  12/07/2012   R & L ICA - 0-49% diameter reduction   CORONARY ARTERY BYPASS GRAFT  09/1996   cath & CABG x5 LIMA-LAD, SVG-sequential OD & OM, SVG sequential to PDA & PLA (Dr. Donata Clay)   REPLACEMENT TOTAL KNEE Bilateral 2001 & 2003   TONSILLECTOMY     TRANSTHORACIC ECHOCARDIOGRAM  08/23/2013   EF 50-55%, LV cavity size mod reduced, mild LVH, mild conc hypertrophy; mild AV stenosis & mild regurg; mild MR - ordered for murmur    Allergies  Allergies  Allergen Reactions  Meloxicam     Other reaction(s): Unknown   Novocain [Procaine]     History of Present Illness    Kathryn Carlson has a PMH of hypertension, NSVT, frequent PVCs, aortic stenosis, coronary artery disease status post CABG x 3 1997, dyslipidemia, symptomatic bradycardia, HLD, and lower extremity edema. Echocardiogram 03/04/2020 showed mild aortic stenosis, moderate aortic regurgitation, 50-55% LVEF, and G2 DD.  She is a former patient of [name] and [name]. She is now followed by [name]. She had an MI at age 77. Underwent nuclear stress test 2013 which  showed no ischemia. She was last seen by [name] on 3/21 during that time she was doing well.  She contacted the nurse triage line on 09/03/20 with concerns about swelling in her lower extremities. She was started on HCTZ 12.5 mg daily. She was seen in this clinic by Marjie Skiff, PA-C on 10/02/2020. During that time she was noted to have continued lower extremity edema. Her creatinine was stable. She did have some increased dyspnea with exertion that was felt to be related to deconditioning. She denied orthopnea or PND. Her weight continues to be stable from a previous visit. She denied palpitations, lightheadedness, dizziness. She did report some numbness and bilateral feet which she attributed to neuropathy. Her main complaint during the visit was related to her memory loss. She reported that this was not a new occurrence and she had noted increased forgetfulness over the last several years. She was instructed to follow-up with her PCP.  She presented to the clinic 01/28/21 for follow-up evaluation stated she was having trouble sleeping and had a poor appetite.  She had been trying to find low-dose melatonin that was suggested by her PCP.  However, she was only  able to find 10 mg dosing at the store.  I instructed her to look in the children section.  She had been supplementing her diet with boost supplement drinks.  She stated that she had lost around 16 pounds.  She reported that she  had cravings for food however when the food arrived she did not have any desire to eat.  She remained physically active walking at friend's home daily.  She noticed brief occasional episodes of what she described as "flutters".  She reported they lasted for only seconds and then dissipated on their own.  She reported that she had labs drawn recently at Dr. Lanell Matar office, we will requested.  I recommended that she increase her physical activity as tolerated, continue her heart healthy diet, and follow-up in 6 months.  She  was seen by [name] in follow-up on 08/05/2021.  During that time her admission to [facility] 07/08/2021 for 3 days with NSTEMI was reviewed.  Her symptoms included pain between her shoulder blades.  Her troponins rose to 5000.  Echocardiogram showed normal LV function and moderate aortic stenosis.  Conservative management was pursued.  She was started on Plavix and denied further symptoms at her follow-up visit.  She presented to the clinic 01/29/2022 for follow-up evaluation stated she felt well .  She continued to walk most days of the week at her assisted living facility (friends home).  Her blood pressure initially  was 158 over 70s.  On recheck it was 128/74.  We reviewed the importance of heart healthy low-salt diet.  She expressed understanding.  She continued to tolerate her medications well without side effects.  She did report that she has some trouble swallowing larger pills.  I  gave her a pill splitter today.  We  planned follow-up for 6 months.  She was seen in follow-up by [name] on 07/31/2022.  During that time she continued to be stable.  She denied chest pain shortness of breath.  Her echocardiogram 07/09/2021 showed normal LV function with G2 DD and moderate aortic stenosis.  She presented to the clinic 02/03/2023 for follow-up evaluation stated she continued to live independently at  friend's home.  She was able to take her own medications, make her walk for her meals.  She iwas a little bit frustrated that her weight was up 3 pounds.  We reviewed healthy weights.  I encouraged her to continue her physical activity.  Her blood pressure initially was 153/92 and on recheck is 158/72.  Her blood pressure was noted to be well-controlled at home.  I requested that she send [place] a 2-week blood pressure log.  I  continued her current medication regimen and planned follow-up in 6 months.  She contacted the nurse triage line on 04/05/2023.  She reported a difference in her strength.  She presents  to the clinic today for follow-up evaluation and states***.  Today she denies chest pain, shortness of breath, lower extremity edema, fatigue, palpitations, melena, hematuria, hemoptysis, diaphoresis, weakness, presyncope, syncope, orthopnea, and PND.     Home Medications    Prior to Admission medications   Medication Sig Start Date End Date Taking? Authorizing Provider  aspirin 81 MG tablet Take 81 mg by mouth daily.     [provider]  atorvastatin (LIPITOR) 40 MG tablet TAKE 1 TABLET BY MOUTH EVERY DAY 07/29/20   Runell Gess, MD  augmented betamethasone dipropionate (DIPROLENE-AF) 0.05 % cream Apply topically 2 (two) times daily.    [provider]  chloridazePOXIDE-amitriptyline (LIMBITROL) 5-12.5 MG per tablet Take 1 tablet by mouth daily.    [provider]  cholecalciferol (VITAMIN D) 1000 UNITS tablet Take 2,000 Units by mouth daily.    [provider]  estradiol (ESTRACE) 1 MG tablet Take 1 mg by mouth daily.    [provider]  fluocinonide (LIDEX) 0.05 % external solution Apply 1 application topically 2 (two) times daily.     [provider]  hydrochlorothiazide (MICROZIDE) 12.5 MG capsule Take 1 capsule (12.5 mg total) by mouth daily. 09/04/20 12/03/20  Runell Gess, MD  hydrocortisone 2.5 % cream APPLY TO RASH/ITCHY SKIN AT BEDTIME 11/18/20   Janalyn Harder, MD  Influenza vac split quadrivalent PF (FLUZONE HIGH-DOSE) 0.5 ML injection Fluzone High-Dose 2019-20 (PF) 180 mcg/0.5 mL intramuscular syringe  TO BE ADMINISTERED BY PHARMACIST FOR IMMUNIZATION    [provider]  isosorbide mononitrate (IMDUR) 30 MG 24 hr tablet TAKE 1 TABLET BY MOUTH EVERY DAY 03/12/20   Runell Gess, MD  levothyroxine (SYNTHROID, LEVOTHROID) 75 MCG tablet Take 75 mcg by mouth daily before breakfast.    [provider]  metoprolol succinate (TOPROL-XL) 100 MG 24 hr tablet TAKE 1 TABLET (100 MG TOTAL) BY MOUTH DAILY. TAKE  WITH OR IMMEDIATELY FOLLOWING A MEAL. 10/28/20 01/26/21  Runell Gess, MD  Multiple Vitamin (MULTIVITAMIN) capsule Take 1 capsule by mouth daily.    [provider]  mupirocin ointment (BACTROBAN) 2 % Apply 1 application topically 2 (two) times daily. 10/17/20   Janalyn Harder, MD  niacin 500 MG tablet Take 500 mg by mouth daily with breakfast.    [provider]  olmesartan (BENICAR) 40 MG tablet Take 1 tablet (40 mg  total) by mouth daily. 04/08/20   Runell Gess, MD  pantoprazole (PROTONIX) 40 MG tablet TAKE 1 TABLET BY MOUTH EVERY DAY Patient taking differently: Take by mouth as needed.  12/05/13   Hilty, Lisette Abu, MD  timolol (TIMOPTIC) 0.5 % ophthalmic solution Place 1 drop into the right eye every morning. 08/20/20   [provider]  TIMOLOL HEMIHYDRATE OP Place 1 drop into the right eye daily.    [provider]  VITAMIN E PO Take 400 mg by mouth daily. Plus D    [provider]    Family History    Family History  Problem Relation Age of Onset   Heart attack Mother    Heart attack Father    Cancer Sister    Diabetes Sister    Heart Problems Sister    She indicated that her mother is deceased. She indicated that her father is deceased. She indicated that her maternal grandmother is deceased. She indicated that her maternal grandfather is deceased. She indicated that her paternal grandmother is deceased. She indicated that her paternal grandfather is deceased.  Social History    Social History   Socioeconomic History   Marital status: Widowed    Spouse name: Not on file   Number of children: 4   Years of education: 30   Highest education level: Not on file  Occupational History   Not on file  Tobacco Use   Smoking status: Never   Smokeless tobacco: Never  Substance and Sexual Activity   Alcohol use: Yes    Comment: "a drink of wine every now and then"   Drug use: Not on file   Sexual activity: Not on file  Other  Topics Concern   Not on file  Social History Narrative   Not on file   Social Determinants of Health   Financial Resource Strain: Not on file  Food Insecurity: Not on file  Transportation Needs: Not on file  Physical Activity: Not on file  Stress: Not on file  Social Connections: Not on file  Intimate Partner Violence: Not on file     Review of Systems    General:  No chills, fever, night sweats or weight changes.  Cardiovascular:  No chest pain, dyspnea on exertion, edema, orthopnea, palpitations, paroxysmal nocturnal dyspnea. Dermatological: No rash, lesions/masses Respiratory: No cough, dyspnea Urologic: No hematuria, dysuria Abdominal:   No nausea, vomiting, diarrhea, bright red blood per rectum, melena, or hematemesis Neurologic:  No visual changes, wkns, changes in mental status. All other systems reviewed and are otherwise negative except as noted above.  Physical Exam    VS:  There were no vitals taken for this visit. , BMI There is no height or weight on file to calculate BMI. GEN: Well nourished, well developed, in no acute distress. HEENT: normal. Neck: Supple, no JVD, carotid bruits, or masses. Cardiac: RRR, 2/6 systolic murmur heard along right sternal border***.  , rubs, or gallops. No clubbing, cyanosis, edema.  Radials/DP/PT 2+ and equal bilaterally.  Respiratory:  Respirations regular and unlabored, clear to auscultation bilaterally. GI: Soft, nontender, nondistended, BS + x 4. MS: no deformity or atrophy. Skin: warm and dry, no rash. Neuro:  Strength and sensation are intact. Psych: Normal affect.  Accessory Clinical Findings    Recent Labs: No results found for requested labs within last 365 days.   Recent Lipid Panel    Component Value Date/Time   CHOL 119 07/09/2021 0038   TRIG 97 07/09/2021 0038  HDL 40 (L) 07/09/2021 0038   CHOLHDL 3.0 07/09/2021 0038   VLDL 19 07/09/2021 0038   LDLCALC 60 07/09/2021 0038    ECG personally reviewed by  me today-none today.  Echocardiogram 03/04/2020 IMPRESSIONS     1. Mild aortic stenosis but moderate aortic regurgitation.   2. Left ventricular ejection fraction, by estimation, is 50 to 55%. The  left ventricle has low normal function. The left ventricle has no regional  wall motion abnormalities. Left ventricular diastolic parameters are  consistent with Grade II diastolic  dysfunction (pseudonormalization).   3. Right ventricular systolic function is normal. The right ventricular  size is normal. There is mildly elevated pulmonary artery systolic  pressure.   4. The mitral valve is normal in structure. Trivial mitral valve  regurgitation. No evidence of mitral stenosis.   5. The aortic valve is tricuspid. Aortic valve regurgitation is moderate.  Mild aortic valve stenosis.   6. The inferior vena cava is normal in size with <50% respiratory  variability, suggesting right atrial pressure of 8 mmHg.   Echocardiogram 07/09/2021 IMPRESSIONS     1. Left ventricular ejection fraction, by estimation, is 50 to 55%. The  left ventricle has low normal function. The left ventricle demonstrates  regional wall motion abnormalities with basal inferior and basal  inferolateral akinesis. There is mild left  ventricular hypertrophy. Left ventricular diastolic parameters are  consistent with Grade II diastolic dysfunction (pseudonormalization).   2. Right ventricular systolic function is normal. The right ventricular  size is normal. Tricuspid regurgitation signal is inadequate for assessing  PA pressure.   3. The mitral valve is abnormal. Mild mitral valve regurgitation. No  evidence of mitral stenosis. Moderate mitral annular calcification.   4. The aortic valve is tricuspid. Aortic valve regurgitation is mild.  Moderate aortic valve stenosis (suspect paradoxical low flow/low gradient  moderate AS). Aortic valve area, by VTI measures 1.01 cm. Aortic valve  mean gradient measures 12.0 mmHg.    5. The IVC was not visualized.   Assessment & Plan   1.Generalized weakness-denies weakness today.  Contacted nurse triage line on 04/05/2023.  Noted a decrease in her strength.  No change in appetite, denies fever and chills Order CBC, BMP***  Coronary artery disease-no chest pain today.  Post NSTEMI 07/08/2021.  Echo withnormal LVEF.  Conservative management recommended.  Plavix started at that time.  Status post CABGx3 in 1997. Nuclear stress test 2013 showed no ischemia.  Heart rate today 86***bpm.  Remains  physically active walking 2 dining area.  Denies exertional chest discomfort. Continue current medical therapy Heart healthy low-sodium diet  Mild aortic stenosis/moderate aortic insufficiency-murmur stable.  Echocardiogram 7/22 showed LVEF 50-55%, moderate AS with mild aortic regurgitation.  Lower extremity edema-weight stable.  Euvolemic.  Denies recent edema. Continue HCTZ Heart healthy low-sodium diet Increase physical activity as tolerated Daily weights- reviewed  Essential hypertension-BP today initially***153/92 and on recheck 138/72.  Her blood pressure is well-controlled at home.   Continue metoprolol, olmesartan, Imdur, HCTZ Heart healthy low-sodium diet Maintain physical activity as tolerated  Hyperlipidemia-LDL 92 on 11/24/21 Continue atorvastatin Heart healthy low-sodium high-fiber diet Increase physical activity as tolerated Follows with PCP       Disposition: Follow-up with [name] or me in 6 months.  Thomasene Ripple. Dante Roudebush NP-C    04/14/2023, 8:45 AM [facility] [address] Office [phone] Fax [phone]  Notice: This dictation was prepared with Dragon dictation along with smaller phrase technology. Any  transcriptional errors that result from this process are unintentional and may not be corrected upon review.  I spent 14***minutes examining this patient, reviewing medications, and using patient centered  shared decision making involving her cardiac care.  Prior to her visit I spent greater than 20 minutes reviewing her past medical history,  medications, and prior cardiac tests.

## 2023-04-15 ENCOUNTER — Ambulatory Visit: Payer: Medicare Other | Admitting: General Practice

## 2023-04-21 NOTE — Progress Notes (Unsigned)
Cardiology Clinic Note   Date: 04/22/2023 ID: Kathryn Carlson, DOB December 29, 1926, MRN [id number]  Primary Cardiologist:  Nanetta Batty, MD  Patient Profile    Kathryn Carlson is a 89 y.o. female who presents to the clinic today for evaluation of BP.   Past medical history significant for: CAD.  CABG x 5 1997. NSTEMI 07/08/2021: Conservative management pursued.  Patient started on Plavix. Mild AI/moderate AS. Echo 07/09/2021: EF 50 to 55%.  RWMA with basal inferior and basal inferolateral akinesis.  Mild LVH.  Grade II DD.  Mild MR.  Moderate MAC.  Mild AI.  Moderate AS, mean gradient 12 mmHg. Frequent PVCs. NSVT. Hypertension. Hyperlipidemia. Lipid panel 11/24/2021: LDL 92, HDL 44, TG 222, total 180.   History of Present Illness    Kathryn Carlson is a longtime patient of cardiology followed by [name] for the above outlined history.  Patient lives at [facility] independent living.  Patient was last seen in the office by Edd Fabian, NP 02/03/2023 with slightly elevated BP during visit.  She was instructed to send a 2-week BP log and no medication changes were made.  Today, patient is accompanied by her daughter.  She reports overall she is doing well.  She has noticed a little bit of a decline in her energy.  She has mild dyspnea with heavier exertion such as rushing around or "doing too many things at once."  This has been stable for several years and she does recover quickly with rest.  She lives independently at different home and is able to perform all personal hygiene ADLs without difficulty.  She uses a walker for ambulation when she is at home and walks her very long hallway 2-3 times a day without any dyspnea.  Patient denies chest pain, orthopnea, PND.  She does have occasional lower extremity edema worsens throughout the day and is normal in the morning.  She spot checks her BP at home and it is normally 135-145/70-80.  She denies headaches or dizziness.  She has had 2  mechanical falls this year.    ROS: All other systems reviewed and are otherwise negative except as noted in History of Present Illness.  Studies Reviewed    ECG is not ordered today.  Risk Assessment/Calculations      HYPERTENSION CONTROL Vitals:   04/22/23 0819 04/22/23 1215  BP: (!) 162/72 (!) 142/72    The patient's blood pressure is elevated above target today.  In order to address the patient's elevated BP: Blood pressure will be monitored at home to determine if medication changes need to be made.           Physical Exam    VS:  BP (!) 162/72 (BP Location: Left Arm, Patient Position: Sitting, Cuff Size: Normal)   Pulse 79   Ht  (1.626 m)   BMI 27.64 kg/m  , BMI Body mass index is 27.64 kg/m.  GEN: Well nourished, well developed, in no acute distress. Neck: No JVD or carotid bruits. Cardiac: RRR. 2/6 systolic murmur. No rubs or gallops.   Respiratory:  Respirations regular and unlabored. Clear to auscultation without rales, wheezing or rhonchi. GI: Soft, nontender, nondistended. Extremities: Radials/DP/PT 2+ and equal bilaterally. No clubbing or cyanosis. No edema   Skin: Warm and dry, no rash. Neuro: Strength intact.  Assessment & Plan    Hypertension.  BP today 162/72 at intake, 142/70 on my recheck.  Patient denies headaches or dizziness.  Continue hydrochlorothiazide, isosorbide, metoprolol.  CAD.  S/p CABG x 5 1997.  NSTEMI July 2022 based on shared decision making conservative management pursued.  Patient denies chest pain, pressure, tightness.  She remains active walking the long hallway in her building 2-3 times a day without dyspnea.  Continue aspirin, Plavix, atorvastatin, metoprolol, Imdur, as needed SL NTG. Mild AI/moderate AS.  Echo July 2022 showed low normal LV function, Grade II DD, mild AI, moderate AS (mean gradient 12 mmHg).  Patient denies lower extremity edema, orthopnea, PND, lightheadedness, dizziness, presyncope or syncope.  Continue  hydrochlorothiazide Hyperlipidemia.  LDL November 2022 92.  Continue heart healthy, low-sodium diet and atorvastatin.  Advance activity as tolerated.  Disposition: Return for previously scheduled visit with [name] or sooner as needed.         Signed, Etta Grandchild. Delsin Copen, DNP, NP-C

## 2023-04-22 ENCOUNTER — Ambulatory Visit: Payer: Medicare Other | Attending: General Practice | Admitting: Student

## 2023-04-22 ENCOUNTER — Encounter: Payer: Self-pay | Admitting: Student

## 2023-04-22 VITALS — BP 142/72 | HR 79 | Ht 64.0 in

## 2023-04-22 DIAGNOSIS — Z951 Presence of aortocoronary bypass graft: Secondary | ICD-10-CM | POA: Diagnosis not present

## 2023-04-22 DIAGNOSIS — E785 Hyperlipidemia, unspecified: Secondary | ICD-10-CM | POA: Diagnosis not present

## 2023-04-22 DIAGNOSIS — I2510 Atherosclerotic heart disease of native coronary artery without angina pectoris: Secondary | ICD-10-CM | POA: Diagnosis not present

## 2023-04-22 DIAGNOSIS — I350 Nonrheumatic aortic (valve) stenosis: Secondary | ICD-10-CM | POA: Diagnosis not present

## 2023-04-22 DIAGNOSIS — I10 Essential (primary) hypertension: Secondary | ICD-10-CM | POA: Diagnosis not present

## 2023-04-22 DIAGNOSIS — I351 Nonrheumatic aortic (valve) insufficiency: Secondary | ICD-10-CM | POA: Insufficient documentation

## 2023-04-22 NOTE — Patient Instructions (Signed)
Medication Instructions:  No Changes *If you need a refill on your cardiac medications before your next appointment, please call your pharmacy*  Lab Work: None Ordered  Testing/Procedures: None Ordered  Follow-Up: At [facility], you and your health needs are our priority.  As part of our continuing mission to provide you with exceptional heart care, we have created designated Provider Care Teams.  These Care Teams include your primary Cardiologist (physician) and Advanced Practice Providers (APPs -  Physician Assistants and Nurse Practitioners) who all work together to provide you with the care you need, when you need it.  We recommend signing up for the patient portal called "MyChart".  Sign up information is provided on this After Visit Summary.  MyChart is used to connect with patients for Virtual Visits (Telemedicine).  Patients are able to view lab/test results, encounter notes, upcoming appointments, etc.  Non-urgent messages can be sent to your provider as well.   To learn more about what you can do with MyChart, go to [email or web address].    Your next appointment:   4 month(s)  Provider:   Nanetta Batty, MD

## 2023-05-06 DIAGNOSIS — Z48817 Encounter for surgical aftercare following surgery on the skin and subcutaneous tissue: Secondary | ICD-10-CM | POA: Diagnosis not present

## 2023-06-02 DIAGNOSIS — I129 Hypertensive chronic kidney disease with stage 1 through stage 4 chronic kidney disease, or unspecified chronic kidney disease: Secondary | ICD-10-CM | POA: Diagnosis not present

## 2023-06-02 DIAGNOSIS — E669 Obesity, unspecified: Secondary | ICD-10-CM | POA: Diagnosis not present

## 2023-06-02 DIAGNOSIS — N1831 Chronic kidney disease, stage 3a: Secondary | ICD-10-CM | POA: Diagnosis not present

## 2023-06-02 DIAGNOSIS — R7301 Impaired fasting glucose: Secondary | ICD-10-CM | POA: Diagnosis not present

## 2023-06-15 DIAGNOSIS — H10413 Chronic giant papillary conjunctivitis, bilateral: Secondary | ICD-10-CM | POA: Diagnosis not present

## 2023-06-17 DIAGNOSIS — S0100XD Unspecified open wound of scalp, subsequent encounter: Secondary | ICD-10-CM | POA: Diagnosis not present

## 2023-06-24 ENCOUNTER — Other Ambulatory Visit: Payer: Self-pay | Admitting: Cardiovascular Disease

## 2023-06-28 ENCOUNTER — Other Ambulatory Visit: Payer: Self-pay | Admitting: Cardiovascular Disease

## 2023-07-07 ENCOUNTER — Encounter (HOSPITAL_COMMUNITY): Payer: Self-pay

## 2023-07-07 ENCOUNTER — Emergency Department (HOSPITAL_COMMUNITY)
Admission: EM | Admit: 2023-07-07 | Discharge: 2023-07-08 | Disposition: A | Discharge status: Skilled Nursing Facility | Payer: Medicare Other | Attending: Emergency Medicine | Admitting: Emergency Medicine

## 2023-07-07 DIAGNOSIS — Z7901 Long term (current) use of anticoagulants: Secondary | ICD-10-CM | POA: Diagnosis not present

## 2023-07-07 DIAGNOSIS — R002 Palpitations: Secondary | ICD-10-CM

## 2023-07-07 DIAGNOSIS — R609 Edema, unspecified: Secondary | ICD-10-CM | POA: Diagnosis not present

## 2023-07-07 DIAGNOSIS — R600 Localized edema: Secondary | ICD-10-CM | POA: Insufficient documentation

## 2023-07-07 DIAGNOSIS — I493 Ventricular premature depolarization: Secondary | ICD-10-CM | POA: Diagnosis not present

## 2023-07-07 DIAGNOSIS — I10 Essential (primary) hypertension: Secondary | ICD-10-CM | POA: Diagnosis not present

## 2023-07-07 DIAGNOSIS — Z7982 Long term (current) use of aspirin: Secondary | ICD-10-CM | POA: Insufficient documentation

## 2023-07-07 DIAGNOSIS — I499 Cardiac arrhythmia, unspecified: Secondary | ICD-10-CM | POA: Diagnosis not present

## 2023-07-07 LAB — CBC WITH DIFFERENTIAL/PLATELET
Abs Immature Granulocytes: 0.02 10*3/uL (ref 0.00–0.07)
Basophils Absolute: 0.1 10*3/uL (ref 0.0–0.1)
Basophils Relative: 1 %
Eosinophils Absolute: 0.1 10*3/uL (ref 0.0–0.5)
Eosinophils Relative: 2 %
HCT: 32.7 % — ABNORMAL LOW (ref 36.0–46.0)
Hemoglobin: 10.4 g/dL — ABNORMAL LOW (ref 12.0–15.0)
Immature Granulocytes: 0 %
Lymphocytes Relative: 33 %
Lymphs Abs: 2.5 10*3/uL (ref 0.7–4.0)
MCH: 33.3 pg (ref 26.0–34.0)
MCHC: 31.8 g/dL (ref 30.0–36.0)
MCV: 104.8 fL — ABNORMAL HIGH (ref 80.0–100.0)
Monocytes Absolute: 0.8 10*3/uL (ref 0.1–1.0)
Monocytes Relative: 10 %
Neutro Abs: 4.2 10*3/uL (ref 1.7–7.7)
Neutrophils Relative %: 54 %
Platelets: 203 10*3/uL (ref 150–400)
RBC: 3.12 MIL/uL — ABNORMAL LOW (ref 3.87–5.11)
RDW: 14.3 % (ref 11.5–15.5)
WBC: 7.7 10*3/uL (ref 4.0–10.5)
nRBC: 0 % (ref 0.0–0.2)

## 2023-07-07 LAB — BASIC METABOLIC PANEL
Anion gap: 13 (ref 5–15)
BUN: 15 mg/dL (ref 8–23)
CO2: 24 mmol/L (ref 22–32)
Calcium: 8.7 mg/dL — ABNORMAL LOW (ref 8.9–10.3)
Chloride: 100 mmol/L (ref 98–111)
Creatinine, Ser: 0.93 mg/dL (ref 0.44–1.00)
GFR, Estimated: 57 mL/min — ABNORMAL LOW (ref >60)
Glucose, Bld: 121 mg/dL — ABNORMAL HIGH (ref 70–99)
Potassium: 4 mmol/L (ref 3.5–5.1)
Sodium: 137 mmol/L (ref 135–145)

## 2023-07-07 LAB — MAGNESIUM: Magnesium: 1.8 mg/dL (ref 1.7–2.4)

## 2023-07-07 MED ORDER — MAGNESIUM OXIDE -MG SUPPLEMENT 400 (240 MG) MG PO TABS
800.0000 mg | ORAL_TABLET | Freq: Once | ORAL | Status: AC
  Administered 2023-07-07: 800 mg via ORAL
  Filled 2023-07-07: qty 2

## 2023-07-07 MED ORDER — ISOSORBIDE MONONITRATE 20 MG PO TABS
20.0000 mg | ORAL_TABLET | Freq: Once | ORAL | Status: AC
  Administered 2023-07-08: 20 mg via ORAL
  Filled 2023-07-07: qty 1

## 2023-07-07 MED ORDER — SODIUM CHLORIDE 0.9 % IV BOLUS
500.0000 mL | Freq: Once | INTRAVENOUS | Status: AC
  Administered 2023-07-07: 500 mL via INTRAVENOUS

## 2023-07-07 NOTE — ED Provider Notes (Signed)
[place] EMERGENCY DEPARTMENT AT [facility] Provider Note   CSN: [id number] Arrival date & time: 07/07/23  2141     History  Chief Complaint  Patient presents with   Palpitations    Kathryn Carlson is a 89 y.o. female.  89 yo F with a chief complaints of palpitations.  She noticed this earlier today.  Nothing seems to make it better or worse.  She feels like it was beating irregularly.  She has not felt like being social for the past couple months but otherwise denies any specific new complaints.  She thinks that maybe her Plavix is making her a bit confused.  She has follow-up with her cardiologist in about a month.  She denies any chest pain or pressure denies abdominal pain denies nausea vomiting or diarrhea.  She has been eating a bit less but has been for some time.  Her legs are swollen but at baseline.   Palpitations      Home Medications Prior to Admission medications   Medication Sig Start Date End Date Taking? Authorizing Provider  aspirin 81 MG tablet Take 81 mg by mouth daily.     [provider]  atorvastatin (LIPITOR) 40 MG tablet TAKE 1 TABLET BY MOUTH EVERY DAY 10/01/22   Runell Gess, MD  augmented betamethasone dipropionate (DIPROLENE-AF) 0.05 % cream Apply topically 2 (two) times daily.    [provider]  chloridazePOXIDE-amitriptyline (LIMBITROL) 5-12.5 MG per tablet Take 1 tablet by mouth daily.    [provider]  cholecalciferol (VITAMIN D) 1000 UNITS tablet Take 2,000 Units by mouth daily.    [provider]  clopidogrel (PLAVIX) 75 MG tablet TAKE 1 TABLET BY MOUTH EVERY DAY 06/24/23   Runell Gess, MD  estradiol (ESTRACE) 1 MG tablet Take 1 mg by mouth daily.    [provider]  fluocinonide (LIDEX) 0.05 % external solution Apply 1 application  topically 2 (two) times daily.    [provider]  hydrochlorothiazide (MICROZIDE) 12.5 MG capsule     [provider]   hydrocortisone 2.5 % cream APPLY TO RASH/ITCHY SKIN AT BEDTIME 11/18/20   Janalyn Harder, MD  Influenza vac split quadrivalent PF (FLUZONE HIGH-DOSE) 0.5 ML injection     [provider]  isosorbide mononitrate (IMDUR) 60 MG 24 hr tablet TAKE 1 TABLET BY MOUTH EVERY DAY 06/28/23   Runell Gess, MD  levothyroxine (SYNTHROID, LEVOTHROID) 75 MCG tablet Take 75 mcg by mouth daily before breakfast. Patient not taking: Reported on 04/22/2023    [provider]  metoprolol succinate (TOPROL-XL) 100 MG 24 hr tablet TAKE 1 TABLET BY MOUTH EVERY DAY WITH OR IMMEDIATELY FOLLOWING A MEAL 10/14/22   Runell Gess, MD  Multiple Vitamin (MULTIVITAMIN) capsule Take 1 capsule by mouth daily.    [provider]  mupirocin ointment (BACTROBAN) 2 % Apply 1 application topically daily. 02/23/22   Janalyn Harder, MD  neomycin-polymyxin b-dexamethasone (MAXITROL) 3.5-10000-0.1 SUSP Place into the right eye. 06/25/21   [provider]  nitroGLYCERIN (NITROSTAT) 0.4 MG SL tablet Place 1 tablet (0.4 mg total) under the tongue every 5 (five) minutes x 3 doses as needed for chest pain. 07/11/21   Filbert Schilder, NP      Allergies    Meloxicam and Novocain [procaine]    Review of Systems   Review of Systems  Cardiovascular:  Positive for palpitations.    Physical Exam Updated Vital Signs BP (!) 218/74 (BP Location:  Right Arm)   Pulse 66   Temp 97.8 F (36.6 C) (Oral)   Resp 17   SpO2 100%  Physical Exam Vitals and nursing note reviewed.  Constitutional:      General: She is not in acute distress.    Appearance: She is well-developed. She is not diaphoretic.  HENT:     Head: Normocephalic and atraumatic.  Eyes:     Pupils: Pupils are equal, round, and reactive to light.  Cardiovascular:     Rate and Rhythm: Normal rate. Rhythm irregular.     Heart sounds: Murmur heard.     No friction rub. No gallop.  Pulmonary:     Effort: Pulmonary effort is normal.      Breath sounds: No wheezing or rales.  Abdominal:     General: There is no distension.     Palpations: Abdomen is soft.     Tenderness: There is no abdominal tenderness.  Musculoskeletal:        General: No tenderness.     Cervical back: Normal range of motion and neck supple.     Right lower leg: Edema present.     Left lower leg: Edema present.     Comments: 1+ bilaterally  Skin:    General: Skin is warm and dry.  Neurological:     Mental Status: She is alert and oriented to person, place, and time.  Psychiatric:        Behavior: Behavior normal.     ED Results / Procedures / Treatments   Labs (all labs ordered are listed, but only abnormal results are displayed) Labs Reviewed  CBC WITH DIFFERENTIAL/PLATELET  BASIC METABOLIC PANEL  MAGNESIUM  BRAIN NATRIURETIC PEPTIDE    EKG EKG Interpretation Date/Time:  Wednesday July 07 2023 21:44:39 EDT Ventricular Rate:  68 PR Interval:  214 QRS Duration:  113 QT Interval:  404 QTC Calculation: 430 R Axis:   7  Text Interpretation: Sinus rhythm Borderline prolonged PR interval Probable left ventricular hypertrophy No significant change since last tracing Confirmed by Melene Plan [phone]) on 07/07/2023 9:51:08 PM  Radiology No results found.  Procedures .1-3 Lead EKG Interpretation  Performed by: Melene Plan, DO Authorized by: Melene Plan, DO     Interpretation: normal     ECG rate:  65   ECG rate assessment: normal     Rhythm: sinus rhythm     Ectopy: PVCs     Conduction: normal       Medications Ordered in ED Medications  magnesium oxide (MAG-OX) tablet 800 mg (800 mg Oral Given 07/07/23 2226)  sodium chloride 0.9 % bolus 500 mL (500 mLs Intravenous New Bag/Given 07/07/23 2247)    ED Course/ Medical Decision Making/ A&P                             Medical Decision Making Amount and/or Complexity of Data Reviewed Labs: ordered.  Risk OTC drugs.   89 yo F with a chief complaint of palpitations.  By history  and exam it seems that she is having symptomatic PVCs.  Will keep the patient on the monitor here.  Blood work electrolytes bolus of IV fluids.  Reassess.  Patient care signed out to Dr. Clayborne Dana, please see his note for further details of care in the ED.  The patients results and plan were reviewed and discussed.   Any x-rays performed were independently reviewed by myself.   Differential diagnosis were considered  with the presenting HPI.  Medications  magnesium oxide (MAG-OX) tablet 800 mg (800 mg Oral Given 07/07/23 2226)  sodium chloride 0.9 % bolus 500 mL (500 mLs Intravenous New Bag/Given 07/07/23 2247)    Vitals:   07/07/23 2145  BP: (!) 218/74  Pulse: 66  Resp: 17  Temp: 97.8 F (36.6 C)  TempSrc: Oral  SpO2: 100%    Final diagnoses:  Palpitations  PVC (premature ventricular contraction)    Admission/ observation were discussed with the admitting physician, patient and/or family and they are comfortable with the plan.          Final Clinical Impression(s) / ED Diagnoses Final diagnoses:  Palpitations  PVC (premature ventricular contraction)    Rx / DC Orders ED Discharge Orders     None         Melene Plan, DO 07/07/23 2309

## 2023-07-07 NOTE — ED Triage Notes (Signed)
Pt BIB [facility] from [facility] with c/o intermittent palpitations since this afternoon. Per EMS, pt had couplet PVCs on 12 lead, otherwise sinus rhythm. Pitting edema to BLE. Hx of bypass. CBG 131. Pt states she feels like her heart is fluttering.

## 2023-07-07 NOTE — ED Provider Notes (Signed)
11:05 PM Assumed care from Dr. Adela Lank, please see their note for full history, physical and decision making until this point. In brief this is a 89 y.o. year old female who presented to the ED tonight with Palpitations     Here with palpitations,possibly PVC's (but has a long history of the same). Has edema, grade II diastolic dysfunction on echo from 22.  mild/moderate AS. Pending labs and reevaluation.  Labs reassuring. Pain free. Symptom free. No palpitations here. Has high bp but patient states it is because she is here. She states it is normally 130-160 systolic but when she's nervous it goes up. She denies chest pain, sob, headache, vision changes or neuro changes. I gave her imdur and BP started to improve. Her BNP was slightly elevated but no e/o fluid overload, suspect this will improve with her BP. Will fu w/ her cardiologist for further managemnt of both. Discharged as per her request.   Discharge instructions, including strict return precautions for new or worsening symptoms, given. Patient and/or family verbalized understanding and agreement with the plan as described.   Labs, studies and imaging reviewed by myself and considered in medical decision making if ordered. Imaging interpreted by radiology.  Labs Reviewed  CBC WITH DIFFERENTIAL/PLATELET  BASIC METABOLIC PANEL  MAGNESIUM  BRAIN NATRIURETIC PEPTIDE    No orders to display    No follow-ups on file.    Carolie Mcilrath, Barbara Cower, MD 07/08/23 [phone]

## 2023-07-08 ENCOUNTER — Telehealth: Payer: Self-pay | Admitting: Cardiovascular Disease

## 2023-07-08 DIAGNOSIS — I493 Ventricular premature depolarization: Secondary | ICD-10-CM | POA: Diagnosis not present

## 2023-07-08 LAB — BRAIN NATRIURETIC PEPTIDE: B Natriuretic Peptide: 521 pg/mL — ABNORMAL HIGH (ref 0.0–100.0)

## 2023-07-08 LAB — TROPONIN I (HIGH SENSITIVITY): Troponin I (High Sensitivity): 8 ng/L (ref <18)

## 2023-07-08 NOTE — ED Notes (Signed)
Received call from pt's daughter who states she is also unable to reach [facility] and will come pick up pt. ETA 30 mins

## 2023-07-08 NOTE — Telephone Encounter (Signed)
Patient called to say she was in the emergency room and they advised her to give [place] a call to see when [name] would to see her.  She does have a 99m f/u appt in August.  Please advise.

## 2023-07-08 NOTE — ED Notes (Signed)
Attempted to call [facility]. Phone rings and goes silent. No answering service, unable to leave voicemail.  

## 2023-07-08 NOTE — ED Notes (Addendum)
Attempted to call [facility]. Phone rings and goes silent. No answering service, unable to leave voicemail. Contacted pt's daughter, Dois Davenport, to inform we are unable to reach facility and pt does not qualify for PTAR. Was informed she would make some calls and attempt to arrange transport.

## 2023-07-08 NOTE — Telephone Encounter (Signed)
Appointment scheduled for 7/26 with Kathryn Carlson

## 2023-07-08 NOTE — Telephone Encounter (Signed)
Patient was seen in the ED and advised to call cardiology for follow up.   Patient would like for you to review ED notes and see if she needs to be seen before 8/23

## 2023-07-08 NOTE — ED Notes (Signed)
Attempted to call [facility]. Phone rings and goes silent. No answering service, unable to leave voicemail.

## 2023-07-22 NOTE — Progress Notes (Addendum)
Cardiology Clinic Note   Date: 07/23/2023 ID: GORDON CARLSON, DOB 12-Aug-1927, MRN [id number]  Primary Cardiologist:  Nanetta Batty, MD  Patient Profile    Kathryn Carlson is a 89 y.o. female who presents to the clinic today for hospital follow up.     Past medical history significant for: CAD.  CABG x 5 1997. NSTEMI 07/08/2021: Conservative management pursued.  Patient started on Plavix. Mild AI/moderate AS. Echo 07/09/2021: EF 50 to 55%.  RWMA with basal inferior and basal inferolateral akinesis.  Mild LVH.  Grade II DD.  Mild MR.  Moderate MAC.  Mild AI.  Moderate AS, mean gradient 12 mmHg. Frequent PVCs. NSVT. Hypertension. Hyperlipidemia. Lipid panel 11/24/2021: LDL 92, HDL 44, TG 222, total 180.     History of Present Illness    Kathryn Carlson is a longtime patient of cardiology followed by [name] for the above outlined history.  Patient resides at [facility] independent living.   Patient was last seen in the office by me on 04/22/2023 for BP follow-up.  She was doing well at that time with baseline dyspnea with heavy exertion but none with routine activities.  She was residing in independent living and performing all ADLs without difficulty.  She ambulated with a walker down a long hallway several times a day at her living facility.  BP was stable at the time of her visit and no changes were made.  Patient presented to the ED via EMS on 07/07/2023 with complaints of palpitations and bilateral lower extremity edema.  Palpitations described as irregular fluttering.  She reported lower extremity edema at baseline.  Sinus rhythm with borderline prolonged PR interval, 68 bpm with no significant changes from previous EKG.  Troponin negative.  BNP 521.  Baseline anemia with hemoglobin 10.4.  No signs of infection or electrolyte derangement.  Patient was found to be hypertensive at 218/74.  She felt this was secondary to being in the ED.  She was given Imdur and BP improved.   She was [facility] to [facility].  Today, patient is here with her friend. She reports she has been doing well since being seen in the ED. Patient denies shortness of breath or dyspnea on exertion. No chest pain, pressure, or tightness. Denies orthopnea or PND. Lower extremity edema is mild and at baseline for her. She has occasional palpitations that are unchanged from previous. She reports decreased energy but is still able to do all ADLs and take two walks a day down the long hallway in her living facility.     ROS: All other systems reviewed and are otherwise negative except as noted in History of Present Illness.  Studies Reviewed    EKG Interpretation Date/Time:  Friday July 23 2023 14:17:15 EDT Ventricular Rate:  68 PR Interval:  198 QRS Duration:  112 QT Interval:  408 QTC Calculation: 433 R Axis:   76  Text Interpretation: Sinus rhythm with occasional Premature ventricular complexes ST & T wave abnormality, consider inferior ischemia When compared with ECG of 07-Jul-2023 21:44, PREVIOUS ECG IS PRESENT Confirmed by Carlos Levering [phone]) on 07/23/2023 2:23:15 PM    Risk Assessment/Calculations      HYPERTENSION CONTROL Vitals:   07/23/23 1410 07/23/23 1459  BP: (!) 158/90 (!) 140/70    The patient's blood pressure is elevated above target today.  In order to address the patient's elevated BP: Blood pressure will be monitored at home to determine if medication changes need to be  made.           Physical Exam    VS:  BP (!) 140/70 (BP Location: Left Arm, Patient Position: Sitting, Cuff Size: Normal)   Pulse 69   Ht 5\' 4"  (1.626 m)   Wt 161 lb 3.2 oz (73.1 kg)   SpO2 95%   BMI 27.67 kg/m  , BMI Body mass index is 27.67 kg/m.  GEN: Well nourished, well developed, in no acute distress. Neck: No JVD or carotid bruits. Cardiac:  RRR. Occasional extrasystole. 2/6 systolic murmur. No rubs or gallops.   Respiratory:  Respirations regular and unlabored.  Clear to auscultation without rales, wheezing or rhonchi. GI: Soft, nontender, nondistended. Extremities: Radials/DP/PT 2+ and equal bilaterally. No clubbing or cyanosis. No edema.  Skin: Warm and dry, no rash. Neuro: Strength intact.  Assessment & Plan    CAD.  S/p CABG x 5 1997.  She had an NSTEMI July 20 22nd and conservative management was pursued with Plavix.  Patient denies chest pain, pressure, tightness. She walks a long hallway in her living facility twice a day without issue. Continue aspirin, Plavix, atorvastatin, metoprolol, isosorbide, as needed SL NTG. Palpitations/frequent PVCs.  Patient with history of frequent PVCs.  She reports occasional palpitations that are unchanged from previous.  Hypertension: BP today 158/90 on intake and 14/70 on my recheck. Patient denies headaches, dizziness or vision changes. Continue isosorbide, metoprolol. Mild AI/moderate AS.  Echo July 2022 showed normal LV function, Grade II DD, mild MR, mild AI, and moderate AS mean gradient 12 mmHg.  Patient reports decreased energy over the last 8+ months but does not report activity intolerance. She reports even if her AS is worse she is not interested in an intervention.   Disposition: Keep previously scheduled visit with [name] 8/23 or return sooner as needed.          Signed, Etta Grandchild. Filipe Greathouse, DNP, NP-C

## 2023-07-23 ENCOUNTER — Telehealth: Payer: Self-pay | Admitting: Cardiovascular Disease

## 2023-07-23 ENCOUNTER — Encounter: Payer: Self-pay | Admitting: Student

## 2023-07-23 ENCOUNTER — Ambulatory Visit: Payer: Medicare Other | Attending: Student | Admitting: Student

## 2023-07-23 VITALS — BP 140/70 | HR 69 | Ht 64.0 in | Wt 161.2 lb

## 2023-07-23 DIAGNOSIS — I351 Nonrheumatic aortic (valve) insufficiency: Secondary | ICD-10-CM

## 2023-07-23 DIAGNOSIS — R002 Palpitations: Secondary | ICD-10-CM

## 2023-07-23 DIAGNOSIS — I2510 Atherosclerotic heart disease of native coronary artery without angina pectoris: Secondary | ICD-10-CM

## 2023-07-23 DIAGNOSIS — I493 Ventricular premature depolarization: Secondary | ICD-10-CM | POA: Diagnosis not present

## 2023-07-23 DIAGNOSIS — I10 Essential (primary) hypertension: Secondary | ICD-10-CM

## 2023-07-23 DIAGNOSIS — I350 Nonrheumatic aortic (valve) stenosis: Secondary | ICD-10-CM | POA: Diagnosis not present

## 2023-07-23 NOTE — Telephone Encounter (Signed)
Patient was brought by friend to appointment. Patient was having trouble walking. Ok to sign by friend per patient.

## 2023-07-23 NOTE — Patient Instructions (Signed)
Medication Instructions:  No changes *If you need a refill on your cardiac medications before your next appointment, please call your pharmacy*   Lab Work: No changes If you have labs (blood work) drawn today and your tests are completely normal, you will receive your results only by: MyChart Message (if you have MyChart) OR A paper copy in the mail If you have any lab test that is abnormal or we need to change your treatment, we will call you to review the results.   Testing/Procedures: none   Follow-Up: At [facility], you and your health needs are our priority.  As part of our continuing mission to provide you with exceptional heart care, we have created designated Provider Care Teams.  These Care Teams include your primary Cardiologist (physician) and Advanced Practice Providers (APPs -  Physician Assistants and Nurse Practitioners) who all work together to provide you with the care you need, when you need it.  We recommend signing up for the patient portal called "MyChart".  Sign up information is provided on this After Visit Summary.  MyChart is used to connect with patients for Virtual Visits (Telemedicine).  Patients are able to view lab/test results, encounter notes, upcoming appointments, etc.  Non-urgent messages can be sent to your provider as well.   To learn more about what you can do with MyChart, go to [email or web address].    Your next appointment:   Keep scheduled appointment for 08/20/2023  Provider:   Nanetta Batty, MD

## 2023-07-29 DIAGNOSIS — S0180XA Unspecified open wound of other part of head, initial encounter: Secondary | ICD-10-CM | POA: Diagnosis not present

## 2023-08-06 DIAGNOSIS — M170 Bilateral primary osteoarthritis of knee: Secondary | ICD-10-CM | POA: Insufficient documentation

## 2023-08-10 ENCOUNTER — Emergency Department (HOSPITAL_COMMUNITY)
Admission: EM | Admit: 2023-08-10 | Discharge: 2023-08-11 | Disposition: A | Discharge status: Home / Self Care | Payer: Medicare Other | Attending: Emergency Medicine | Admitting: Emergency Medicine

## 2023-08-10 ENCOUNTER — Encounter (HOSPITAL_COMMUNITY): Payer: Self-pay | Admitting: Emergency Medicine

## 2023-08-10 ENCOUNTER — Other Ambulatory Visit: Payer: Self-pay

## 2023-08-10 ENCOUNTER — Emergency Department (HOSPITAL_COMMUNITY): Payer: Medicare Other

## 2023-08-10 DIAGNOSIS — E871 Hypo-osmolality and hyponatremia: Secondary | ICD-10-CM | POA: Diagnosis not present

## 2023-08-10 DIAGNOSIS — Z7982 Long term (current) use of aspirin: Secondary | ICD-10-CM | POA: Insufficient documentation

## 2023-08-10 DIAGNOSIS — Z8616 Personal history of COVID-19: Secondary | ICD-10-CM | POA: Insufficient documentation

## 2023-08-10 DIAGNOSIS — R531 Weakness: Secondary | ICD-10-CM | POA: Diagnosis not present

## 2023-08-10 DIAGNOSIS — Z79899 Other long term (current) drug therapy: Secondary | ICD-10-CM | POA: Diagnosis not present

## 2023-08-10 DIAGNOSIS — I10 Essential (primary) hypertension: Secondary | ICD-10-CM | POA: Insufficient documentation

## 2023-08-10 DIAGNOSIS — E876 Hypokalemia: Secondary | ICD-10-CM

## 2023-08-10 DIAGNOSIS — R110 Nausea: Secondary | ICD-10-CM | POA: Diagnosis not present

## 2023-08-10 DIAGNOSIS — R457 State of emotional shock and stress, unspecified: Secondary | ICD-10-CM | POA: Diagnosis not present

## 2023-08-10 DIAGNOSIS — R9389 Abnormal findings on diagnostic imaging of other specified body structures: Secondary | ICD-10-CM | POA: Diagnosis not present

## 2023-08-10 DIAGNOSIS — Z7902 Long term (current) use of antithrombotics/antiplatelets: Secondary | ICD-10-CM | POA: Diagnosis not present

## 2023-08-10 DIAGNOSIS — R0902 Hypoxemia: Secondary | ICD-10-CM | POA: Diagnosis not present

## 2023-08-10 DIAGNOSIS — U071 COVID-19: Secondary | ICD-10-CM | POA: Diagnosis not present

## 2023-08-10 LAB — CBC WITH DIFFERENTIAL/PLATELET
Abs Immature Granulocytes: 0.04 10*3/uL (ref 0.00–0.07)
Basophils Absolute: 0 10*3/uL (ref 0.0–0.1)
Basophils Relative: 0 %
Eosinophils Absolute: 0 10*3/uL (ref 0.0–0.5)
Eosinophils Relative: 0 %
HCT: 31.1 % — ABNORMAL LOW (ref 36.0–46.0)
Hemoglobin: 10.1 g/dL — ABNORMAL LOW (ref 12.0–15.0)
Immature Granulocytes: 1 %
Lymphocytes Relative: 20 %
Lymphs Abs: 1.2 10*3/uL (ref 0.7–4.0)
MCH: 32.5 pg (ref 26.0–34.0)
MCHC: 32.5 g/dL (ref 30.0–36.0)
MCV: 100 fL (ref 80.0–100.0)
Monocytes Absolute: 0.7 10*3/uL (ref 0.1–1.0)
Monocytes Relative: 11 %
Neutro Abs: 4 10*3/uL (ref 1.7–7.7)
Neutrophils Relative %: 68 %
Platelets: 196 10*3/uL (ref 150–400)
RBC: 3.11 MIL/uL — ABNORMAL LOW (ref 3.87–5.11)
RDW: 14 % (ref 11.5–15.5)
WBC: 6 10*3/uL (ref 4.0–10.5)
nRBC: 0 % (ref 0.0–0.2)

## 2023-08-10 LAB — COMPREHENSIVE METABOLIC PANEL
ALT: 14 U/L (ref 0–44)
AST: 23 U/L (ref 15–41)
Albumin: 2.8 g/dL — ABNORMAL LOW (ref 3.5–5.0)
Alkaline Phosphatase: 108 U/L (ref 38–126)
Anion gap: 8 (ref 5–15)
BUN: 8 mg/dL (ref 8–23)
CO2: 23 mmol/L (ref 22–32)
Calcium: 8.1 mg/dL — ABNORMAL LOW (ref 8.9–10.3)
Chloride: 101 mmol/L (ref 98–111)
Creatinine, Ser: 0.82 mg/dL (ref 0.44–1.00)
GFR, Estimated: >60 mL/min (ref >60)
Glucose, Bld: 140 mg/dL — ABNORMAL HIGH (ref 70–99)
Potassium: 2.8 mmol/L — ABNORMAL LOW (ref 3.5–5.1)
Sodium: 132 mmol/L — ABNORMAL LOW (ref 135–145)
Total Bilirubin: 0.9 mg/dL (ref 0.3–1.2)
Total Protein: 6.7 g/dL (ref 6.5–8.1)

## 2023-08-10 LAB — MAGNESIUM: Magnesium: 1.7 mg/dL (ref 1.7–2.4)

## 2023-08-10 LAB — TROPONIN I (HIGH SENSITIVITY)
Troponin I (High Sensitivity): 21 ng/L — ABNORMAL HIGH (ref <18)
Troponin I (High Sensitivity): 22 ng/L — ABNORMAL HIGH (ref <18)

## 2023-08-10 MED ORDER — METOPROLOL SUCCINATE ER 50 MG PO TB24
100.0000 mg | ORAL_TABLET | Freq: Every day | ORAL | Status: DC
  Administered 2023-08-11: 100 mg via ORAL
  Filled 2023-08-10: qty 2

## 2023-08-10 MED ORDER — CLOPIDOGREL BISULFATE 75 MG PO TABS
75.0000 mg | ORAL_TABLET | Freq: Every day | ORAL | Status: DC
  Administered 2023-08-11: 75 mg via ORAL
  Filled 2023-08-10: qty 1

## 2023-08-10 MED ORDER — POTASSIUM CHLORIDE CRYS ER 20 MEQ PO TBCR
20.0000 meq | EXTENDED_RELEASE_TABLET | Freq: Once | ORAL | Status: AC
  Administered 2023-08-11: 20 meq via ORAL
  Filled 2023-08-10: qty 1

## 2023-08-10 MED ORDER — CHLORDIAZEPOXIDE-AMITRIPTYLINE 5-12.5 MG PO TABS: 1.0000 | ORAL_TABLET | Freq: Every day | ORAL | Status: DC

## 2023-08-10 MED ORDER — LEVOTHYROXINE SODIUM 50 MCG PO TABS
75.0000 ug | ORAL_TABLET | Freq: Every day | ORAL | Status: DC
  Administered 2023-08-11: 75 ug via ORAL
  Filled 2023-08-10: qty 1

## 2023-08-10 MED ORDER — POTASSIUM CHLORIDE ER 10 MEQ PO TBCR: 20.0000 meq | EXTENDED_RELEASE_TABLET | Freq: Every day | ORAL | 0 refills | Status: DC

## 2023-08-10 MED ORDER — ISOSORBIDE MONONITRATE ER 60 MG PO TB24
60.0000 mg | ORAL_TABLET | Freq: Every day | ORAL | Status: DC
  Administered 2023-08-11: 60 mg via ORAL
  Filled 2023-08-10: qty 1

## 2023-08-10 MED ORDER — SODIUM CHLORIDE 0.9 % IV BOLUS
1000.0000 mL | Freq: Once | INTRAVENOUS | Status: AC
  Administered 2023-08-10: 1000 mL via INTRAVENOUS

## 2023-08-10 MED ORDER — ESTRADIOL 0.5 MG PO TABS
1.0000 mg | ORAL_TABLET | Freq: Every day | ORAL | Status: DC
  Administered 2023-08-11: 1 mg via ORAL
  Filled 2023-08-10: qty 2

## 2023-08-10 MED ORDER — LORAZEPAM 0.5 MG PO TABS
0.5000 mg | ORAL_TABLET | Freq: Once | ORAL | Status: AC | PRN
  Administered 2023-08-10: 0.5 mg via ORAL
  Filled 2023-08-10: qty 1

## 2023-08-10 MED ORDER — POTASSIUM CHLORIDE 10 MEQ/100ML IV SOLN
10.0000 meq | INTRAVENOUS | Status: AC
  Administered 2023-08-10 (×2): 10 meq via INTRAVENOUS
  Filled 2023-08-10 (×2): qty 100

## 2023-08-10 MED ORDER — ONDANSETRON HCL 4 MG PO TABS: 4.0000 mg | ORAL_TABLET | Freq: Three times a day (TID) | ORAL | 0 refills | Status: DC | PRN

## 2023-08-10 MED ORDER — ATORVASTATIN CALCIUM 40 MG PO TABS
40.0000 mg | ORAL_TABLET | Freq: Every day | ORAL | Status: DC
  Administered 2023-08-11: 40 mg via ORAL
  Filled 2023-08-10: qty 1

## 2023-08-10 MED ORDER — POTASSIUM CHLORIDE CRYS ER 20 MEQ PO TBCR
40.0000 meq | EXTENDED_RELEASE_TABLET | Freq: Once | ORAL | Status: DC
  Filled 2023-08-10: qty 2

## 2023-08-10 NOTE — ED Provider Notes (Addendum)
Unfortunately, RN after numerous attempts has not been able to confirm with [facility] that the patient does not fact have a spot reserved in assisted living.  I do think she would benefit from assisted living for the next 1 to 2 weeks to have additional assistance while she is recovering from COVID.  Therefore the patient will board in the ED overnight and I have consulted social work to confirm this disposition status with her facility in the morning.   Repeat K level check ordered for 0800 tomorrow morning.  Terald Sleeper, MD 08/10/23 8413    Terald Sleeper, MD 08/10/23 [phone]

## 2023-08-10 NOTE — ED Notes (Signed)
Called [facility] independent living at this time. HIPAA compliant voicemail left and call back number provided.

## 2023-08-10 NOTE — ED Notes (Addendum)
Attempted calling [facility] independent living again at this time. Still no answer. Call back number provided.

## 2023-08-10 NOTE — Discharge Instructions (Addendum)
Your blood work showed that you had mildly low potassium and sodium levels.  You were given IV fluid and IV potassium in the ER.  You were discharged with potassium supplements to take over the next several days at home.  Most people are symptomatic from Covid for 5 to 10 days.  This can include weakness and nausea.  Please continue drinking plenty of water.  Consider assisted living option and using a walker whenever walking for extra stability.

## 2023-08-10 NOTE — ED Notes (Signed)
Left voicemail for [facility] to return call.

## 2023-08-10 NOTE — ED Notes (Addendum)
This RN spoke with patient about sending her back to [facility]. Pt came from independent living. Pt informed this RN that the nurse Rosey Bath at her facility was supposed to arrange for her to return to the assisted living side of [facility] discharge. This RN has called the independent living facility three times with no response. This RN spoke to someone on the assisted living side who was unaware of whether this had been arranged This RN was then transferred to their nursing supervisor Phineas Semen. Phineas Semen did not answer, HIPAA compliant voicemail and call back number provided. MD aware. Charge nurse aware.

## 2023-08-10 NOTE — ED Notes (Signed)
This RN spoke to [employer] Librarian, academic at [employer]) who stated she did not have any arrangements in place to have patient move from independent living to their assisted living facility. Angelique Blonder advised this RN to call [facility] [address] assisted living to see if arrangements had been made there. This RN called and spoke to [place] at [facility] who stated she had not been made aware and could not find arrangements for patient to be moved to their facility. Riley Lam stated she is the only nurse there. MD made aware. Charge RN aware.

## 2023-08-10 NOTE — ED Triage Notes (Signed)
Pt BIB [facility] from independent living facility with reports of weakness. Pt stating she hd positive COVID test 9 days ago and since that time she has been weak. Denies SHOB or pain.

## 2023-08-10 NOTE — ED Provider Notes (Signed)
[place] EMERGENCY DEPARTMENT AT [facility] Provider Note   CSN: [id number] Arrival date & time: 08/10/23  1530     History  Chief Complaint  Patient presents with   Weakness    Kathryn Carlson is a 89 y.o. female presenting from living with concern for general fatigue.  Patient tested positive for COVID 9 days ago.  She said she has had poor appetite since then.  She is drinking plenty of water.  She was advised to come to the ED to get checked out by a visiting nurse.  She reports she is still able to walk, though her legs feel weak.  She typically is quite independent.  She denies any chest pain or pressure or difficulty breathing  HPI     Home Medications Prior to Admission medications   Medication Sig Start Date End Date Taking? Authorizing Provider  ondansetron (ZOFRAN) 4 MG tablet Take 1 tablet (4 mg total) by mouth every 8 (eight) hours as needed for up to 15 doses for nausea or vomiting. 08/10/23  Yes , Kermit Balo, MD  potassium chloride (KLOR-CON) 10 MEQ tablet Take 2 tablets (20 mEq total) by mouth daily for 14 days. 08/10/23 08/24/23 Yes Terald Sleeper, MD  aspirin 81 MG tablet Take 81 mg by mouth daily.     [provider]  atorvastatin (LIPITOR) 40 MG tablet TAKE 1 TABLET BY MOUTH EVERY DAY 10/01/22   Runell Gess, MD  augmented betamethasone dipropionate (DIPROLENE-AF) 0.05 % cream Apply topically 2 (two) times daily. Patient not taking: Reported on 07/23/2023    [provider]  chloridazePOXIDE-amitriptyline (LIMBITROL) 5-12.5 MG per tablet Take 1 tablet by mouth daily.    [provider]  cholecalciferol (VITAMIN D) 1000 UNITS tablet Take 2,000 Units by mouth daily.    [provider]  clopidogrel (PLAVIX) 75 MG tablet TAKE 1 TABLET BY MOUTH EVERY DAY 06/24/23   Runell Gess, MD  estradiol (ESTRACE) 1 MG tablet Take 1 mg by mouth daily.    [provider]  fluocinonide (LIDEX) 0.05 % external  solution Apply 1 application  topically 2 (two) times daily. Patient not taking: Reported on 07/23/2023    [provider]  hydrocortisone 2.5 % cream APPLY TO RASH/ITCHY SKIN AT BEDTIME Patient not taking: Reported on 07/23/2023 11/18/20   Janalyn Harder, MD  Influenza vac split quadrivalent PF (FLUZONE HIGH-DOSE) 0.5 ML injection     [provider]  isosorbide mononitrate (IMDUR) 60 MG 24 hr tablet TAKE 1 TABLET BY MOUTH EVERY DAY 06/28/23   Runell Gess, MD  levothyroxine (SYNTHROID) 75 MCG tablet Take 75 mcg by mouth daily before breakfast.    [provider]  metoprolol succinate (TOPROL-XL) 100 MG 24 hr tablet TAKE 1 TABLET BY MOUTH EVERY DAY WITH OR IMMEDIATELY FOLLOWING A MEAL 10/14/22   Runell Gess, MD  Multiple Vitamin (MULTIVITAMIN) capsule Take 1 capsule by mouth daily.    [provider]  mupirocin ointment (BACTROBAN) 2 % Apply 1 application topically daily. Patient not taking: Reported on 07/23/2023 02/23/22   Janalyn Harder, MD  neomycin-polymyxin b-dexamethasone (MAXITROL) 3.5-10000-0.1 SUSP Place into the right eye. Patient not taking: Reported on 07/23/2023 06/25/21   [provider]  nitroGLYCERIN (NITROSTAT) 0.4 MG SL tablet Place 1 tablet (0.4 mg total) under the tongue every 5 (five) minutes x 3 doses as needed for chest pain. 07/11/21   Georgie Chard D, NP  penicillin v potassium (VEETID) 500  MG tablet Take 500 mg by mouth every 6 (six) hours. 07/20/23   [provider]      Allergies    Meloxicam and Novocain [procaine]    Review of Systems   Review of Systems  Physical Exam Updated Vital Signs BP (!) 209/88   Pulse 97   Temp 97.6 F (36.4 C) (Oral)   Resp 12   SpO2 97%  Physical Exam Constitutional:      General: She is not in acute distress. HENT:     Head: Normocephalic and atraumatic.  Eyes:     Conjunctiva/sclera: Conjunctivae normal.     Pupils: Pupils are equal, round, and reactive to light.   Cardiovascular:     Rate and Rhythm: Normal rate and regular rhythm.  Pulmonary:     Effort: Pulmonary effort is normal. No respiratory distress.  Abdominal:     General: There is no distension.     Tenderness: There is no abdominal tenderness.  Skin:    General: Skin is warm and dry.  Neurological:     General: No focal deficit present.     Mental Status: She is alert. Mental status is at baseline.  Psychiatric:        Mood and Affect: Mood normal.        Behavior: Behavior normal.     ED Results / Procedures / Treatments   Labs (all labs ordered are listed, but only abnormal results are displayed) Labs Reviewed  CBC WITH DIFFERENTIAL/PLATELET - Abnormal; Notable for the following components:      Result Value   RBC 3.11 (*)    Hemoglobin 10.1 (*)    HCT 31.1 (*)    All other components within normal limits  COMPREHENSIVE METABOLIC PANEL - Abnormal; Notable for the following components:   Sodium 132 (*)    Potassium 2.8 (*)    Glucose, Bld 140 (*)    Calcium 8.1 (*)    Albumin 2.8 (*)    All other components within normal limits  TROPONIN I (HIGH SENSITIVITY) - Abnormal; Notable for the following components:   Troponin I (High Sensitivity) 21 (*)    All other components within normal limits  TROPONIN I (HIGH SENSITIVITY) - Abnormal; Notable for the following components:   Troponin I (High Sensitivity) 22 (*)    All other components within normal limits  MAGNESIUM    EKG None  Radiology DG Chest 2 View  Result Date: 08/10/2023 CLINICAL DATA:  Weakness.  COVID-19. EXAM: CHEST - 2 VIEW COMPARISON:  Chest radiograph dated 07/21/2021. FINDINGS: Mild elevation of the right hemidiaphragm. No focal consolidation, pleural effusion, or pneumothorax. Stable cardiac silhouette with median sternotomy wires and CABG vascular clips. No acute osseous pathology. Degenerative changes of the spine. IMPRESSION: No active cardiopulmonary disease. Electronically Signed   By: Elgie Collard M.D.   On: 08/10/2023 17:19    Procedures .Critical Care  Performed by: Terald Sleeper, MD Authorized by: Terald Sleeper, MD   Critical care provider statement:    Critical care time (minutes):  40   Critical care time was exclusive of:  Separately billable procedures and treating other patients   Critical care was necessary to treat or prevent imminent or life-threatening deterioration of the following conditions:  Metabolic crisis   Critical care was time spent personally by me on the following activities:  Ordering and performing treatments and interventions, ordering and review of laboratory studies, ordering and review of radiographic studies, pulse oximetry, review  of old charts, examination of patient and evaluation of patient's response to treatment Comments:     IV K repletion     Medications Ordered in ED Medications  potassium chloride SA (KLOR-CON M) CR tablet 40 mEq (40 mEq Oral Not Given 08/10/23 1735)  LORazepam (ATIVAN) tablet 0.5 mg (has no administration in time range)  potassium chloride 10 mEq in 100 mL IVPB (0 mEq Intravenous Stopped 08/10/23 1951)  sodium chloride 0.9 % bolus 1,000 mL (1,000 mLs Intravenous New Bag/Given 08/10/23 1734)    ED Course/ Medical Decision Making/ A&P                                 Medical Decision Making Amount and/or Complexity of Data Reviewed Labs: ordered.  Risk Prescription drug management.   Patient is presented to ED reporting day 9 of COVID symptoms.  Reporting general fatigue.  She is somewhat hypertensive vital signs otherwise unremarkable.  No evidence of respiratory distress.  I did check the patient's electrolyte levels and her kidney function for evidence of dehydration, malnourishment, AKI.  I personally reviewed and interpreted patient's workup, notable for hypokalemia potassium 2.8.  Mild hyponatremia sodium 132.  BUN 8.  Troponin 21 -> 23, flat  Patient ordered normal saline for hyponatremia, IV  potassium for hypokalemia.  Will need outpatient recheck of K  EKG per my interpretation shows no acute ischemic findings.   Patient experiencing high level of anxiety being in ED /hospital, and is asking for the door to be left open despite Covid precautions.  I suspect this anxiety is likely the cause for her borderline tachycardia or hypotension; we'll offer PRN ativan as we attempt to reach her facility to confirm her return.  She will need [facility] transport.  Patient offered food and water.  At this time, given that the patient is able to ambulate, is able to tolerate fluids, and has assisted living resources to return to, I do think it is reasonable to discharge her home and she would not require hospitalization.  I explained to her that I expect that she will gradually begin to feel better over the course of the next week, she is likely at the tail end of this Covid viral illness.    I have a low suspicion otherwise for sepsis, ACS, PE or other emergent cause of her symptoms.   Kathryn Carlson was evaluated in Emergency Department on 08/10/2023 for the symptoms described in the history of present illness. She was evaluated in the context of the global COVID-19 pandemic, which necessitated consideration that the patient might be at risk for infection with the SARS-CoV-2 virus that causes COVID-19. Institutional protocols and algorithms that pertain to the evaluation of patients at risk for COVID-19 are in a state of rapid change based on information released by regulatory bodies including the CDC and federal and state organizations. These policies and algorithms were followed during the patient's care in the ED.         Final Clinical Impression(s) / ED Diagnoses Final diagnoses:  Hypokalemia  Hyponatremia  COVID-19    Rx / DC Orders ED Discharge Orders          Ordered    potassium chloride (KLOR-CON) 10 MEQ tablet  Daily        08/10/23 1957    ondansetron (ZOFRAN) 4 MG tablet   Every 8 hours PRN  08/10/23 1957              Terald Sleeper, MD 08/10/23 2121

## 2023-08-11 DIAGNOSIS — Z7401 Bed confinement status: Secondary | ICD-10-CM | POA: Diagnosis not present

## 2023-08-11 DIAGNOSIS — U071 COVID-19: Secondary | ICD-10-CM | POA: Diagnosis not present

## 2023-08-11 DIAGNOSIS — I10 Essential (primary) hypertension: Secondary | ICD-10-CM | POA: Diagnosis not present

## 2023-08-11 MED ORDER — ZOLPIDEM TARTRATE 5 MG PO TABS
5.0000 mg | ORAL_TABLET | Freq: Once | ORAL | Status: AC
  Administered 2023-08-11: 5 mg via ORAL
  Filled 2023-08-11: qty 1

## 2023-08-11 NOTE — ED Notes (Signed)
Pts blood pressure is elevated at 203/81. Pt is anxious and agitated at this time. Dr. Renaye Rakers notified of patient condition and elevated bp. No new orders received.

## 2023-08-11 NOTE — ED Provider Notes (Signed)
  Physical Exam  BP (!) 215/107 (BP Location: Right Arm)   Pulse (!) 110   Temp 98.1 F (36.7 C) (Oral)   Resp 20   SpO2 96%   Physical Exam  Procedures  Procedures  ED Course / MDM    Medical Decision Making Amount and/or Complexity of Data Reviewed Labs: ordered.  Risk Prescription drug management.   Patient has been cleared for [facility] assisted living.       Benjiman Core, MD 08/11/23 [phone]

## 2023-08-11 NOTE — [place] FL2 (Addendum)
[place] MEDICAID FL2 LEVEL OF CARE FORM     IDENTIFICATION  Patient Name: Kathryn Carlson Birthdate: 1927-01-14 Sex: female Admission Date (Current Location): 08/10/2023  [facility] and [place] Number:  Producer, television/film/video and Address:  [facility],  [address] [place], [place] [address]      Provider Number: [id number]  Attending Physician Name and Address:  Default, Provider, MD  Relative Name and Phone Number:  Hosie Poisson (Daughter)  [phone]    Current Level of Care: Hospital Recommended Level of Care: Assisted Living Facility Prior Approval Number:    Date Approved/Denied:   PASRR Number:    Discharge Plan: Other (Comment) (Assisted Living Facility)    Current Diagnoses: Patient Active Problem List   Diagnosis Date Noted   NSTEMI (non-ST elevated myocardial infarction) (HCC) 07/08/2021   Insomnia 05/18/2018   Fatigue 11/30/2017   Obesity (BMI 30.0-34.9) 11/30/2017   DOE (dyspnea on exertion) 11/12/2017   Aortic stenosis, mild 02/05/2017   Frequent PVCs 06/04/2014   NSVT (nonsustained ventricular tachycardia) (HCC) 12/04/2013   HTN (hypertension) 10/25/2013   Dyslipidemia 10/25/2013   S/P CABG x 3 10/25/2013   Symptomatic bradycardia 10/25/2013    Orientation RESPIRATION BLADDER Height & Weight     Self, Time, Situation, Place  Normal Continent Weight:   Height:     BEHAVIORAL SYMPTOMS/MOOD NEUROLOGICAL BOWEL NUTRITION STATUS      Continent Diet (Regular)  AMBULATORY STATUS COMMUNICATION OF NEEDS Skin   Independent Verbally Normal                       Personal Care Assistance Level of Assistance  Bathing, Feeding, Dressing Bathing Assistance: Independent Feeding assistance: Independent Dressing Assistance: Independent     Functional Limitations Info  Sight, Hearing, Speech Sight Info: Adequate Hearing Info: Adequate Speech Info: Adequate    SPECIAL CARE FACTORS FREQUENCY                        Contractures Contractures Info: Not present    Additional Factors Info  Code Status, Allergies Code Status Info: Full Allergies Info: Meloxicam  Novocain (Procaine)           Current Medications (08/11/2023):  This is the current hospital active medication list Current Facility-Administered Medications  Medication Dose Route Frequency Provider Last Rate Last Admin   atorvastatin (LIPITOR) tablet 40 mg  40 mg Oral Daily Terald Sleeper, MD   40 mg at 08/11/23 0916   clopidogrel (PLAVIX) tablet 75 mg  75 mg Oral Daily Terald Sleeper, MD   75 mg at 08/11/23 0932   estradiol (ESTRACE) tablet 1 mg  1 mg Oral Daily Terald Sleeper, MD   1 mg at 08/11/23 0932   isosorbide mononitrate (IMDUR) 24 hr tablet 60 mg  60 mg Oral Daily Terald Sleeper, MD   60 mg at 08/11/23 0932   levothyroxine (SYNTHROID) tablet 75 mcg  75 mcg Oral QAC breakfast Terald Sleeper, MD   75 mcg at 08/11/23 5621   metoprolol succinate (TOPROL-XL) 24 hr tablet 100 mg  100 mg Oral Daily Terald Sleeper, MD   100 mg at 08/11/23 3086   potassium chloride SA (KLOR-CON M) CR tablet 40 mEq  40 mEq Oral Once Terald Sleeper, MD       triamcinolone acetonide (KENALOG-40) injection 40 mg  40 mg Intramuscular Once Carmelina Dane, MD       Current  Outpatient Medications  Medication Sig Dispense Refill   aspirin 81 MG tablet Take 81 mg by mouth daily.      atorvastatin (LIPITOR) 40 MG tablet TAKE 1 TABLET BY MOUTH EVERY DAY 90 tablet 3   clopidogrel (PLAVIX) 75 MG tablet TAKE 1 TABLET BY MOUTH EVERY DAY 90 tablet 3   estradiol (ESTRACE) 1 MG tablet Take 1 mg by mouth daily.     isosorbide mononitrate (IMDUR) 60 MG 24 hr tablet TAKE 1 TABLET BY MOUTH EVERY DAY 90 tablet 3   levothyroxine (SYNTHROID) 75 MCG tablet Take 75 mcg by mouth daily before breakfast.     metoprolol succinate (TOPROL-XL) 100 MG 24 hr tablet TAKE 1 TABLET BY MOUTH EVERY DAY WITH OR IMMEDIATELY FOLLOWING A MEAL 90 tablet 3   ondansetron  (ZOFRAN) 4 MG tablet Take 1 tablet (4 mg total) by mouth every 8 (eight) hours as needed for up to 15 doses for nausea or vomiting. 15 tablet 0   potassium chloride (KLOR-CON) 10 MEQ tablet Take 2 tablets (20 mEq total) by mouth daily for 14 days. 28 tablet 0     Discharge Medications: Please see discharge summary for a list of discharge medications.  Relevant Imaging Results:  Relevant Lab Results:   Additional Information SSN: [id number]  Princella Ion, LCSW

## 2023-08-11 NOTE — Progress Notes (Signed)
Spoke with Lajoyce Corners [phone]) at [facility] who confirmed the pt is able to transition into Assisted Living temporarily. Notified EDP and RN via secure chat. [facility] requested FL2 and this CSW sent it over via hub. Notified pt's daughter, Dois Davenport (daughter/POA) who is in agreement with discharge back to [facility]. Dois Davenport was informed pt will temporarily go to Assisted Living. [facility] to transport.

## 2023-08-11 NOTE — ED Notes (Signed)
RN attempted to call report to [phone], RN left VM

## 2023-08-11 NOTE — Progress Notes (Addendum)
Left a vm for Pennie Rushing at [employer] Awaiting a call back.   Addend @ 9:33 AM Left vm for Alcario Drought, [place] RN requesting a call back.

## 2023-08-11 NOTE — ED Notes (Addendum)
[facility] arrived to transport patient to [facility]; RN called spoke to [place] and gave report.

## 2023-08-20 ENCOUNTER — Ambulatory Visit: Payer: 59 | Admitting: Cardiovascular Disease

## 2023-08-24 DIAGNOSIS — M6281 Muscle weakness (generalized): Secondary | ICD-10-CM | POA: Diagnosis not present

## 2023-08-24 DIAGNOSIS — R2681 Unsteadiness on feet: Secondary | ICD-10-CM | POA: Diagnosis not present

## 2023-08-24 DIAGNOSIS — R2689 Other abnormalities of gait and mobility: Secondary | ICD-10-CM | POA: Diagnosis not present

## 2023-08-25 DIAGNOSIS — R2689 Other abnormalities of gait and mobility: Secondary | ICD-10-CM | POA: Diagnosis not present

## 2023-08-25 DIAGNOSIS — R2681 Unsteadiness on feet: Secondary | ICD-10-CM | POA: Diagnosis not present

## 2023-08-25 DIAGNOSIS — M6281 Muscle weakness (generalized): Secondary | ICD-10-CM | POA: Diagnosis not present

## 2023-08-26 DIAGNOSIS — R2689 Other abnormalities of gait and mobility: Secondary | ICD-10-CM | POA: Diagnosis not present

## 2023-08-26 DIAGNOSIS — M6281 Muscle weakness (generalized): Secondary | ICD-10-CM | POA: Diagnosis not present

## 2023-08-26 DIAGNOSIS — R2681 Unsteadiness on feet: Secondary | ICD-10-CM | POA: Diagnosis not present

## 2023-08-27 DIAGNOSIS — R2681 Unsteadiness on feet: Secondary | ICD-10-CM | POA: Diagnosis not present

## 2023-08-27 DIAGNOSIS — M6281 Muscle weakness (generalized): Secondary | ICD-10-CM | POA: Diagnosis not present

## 2023-08-27 DIAGNOSIS — R2689 Other abnormalities of gait and mobility: Secondary | ICD-10-CM | POA: Diagnosis not present

## 2023-08-30 DIAGNOSIS — R2689 Other abnormalities of gait and mobility: Secondary | ICD-10-CM | POA: Diagnosis not present

## 2023-08-30 DIAGNOSIS — M6281 Muscle weakness (generalized): Secondary | ICD-10-CM | POA: Diagnosis not present

## 2023-08-30 DIAGNOSIS — R2681 Unsteadiness on feet: Secondary | ICD-10-CM | POA: Diagnosis not present

## 2023-08-31 DIAGNOSIS — R2681 Unsteadiness on feet: Secondary | ICD-10-CM | POA: Diagnosis not present

## 2023-08-31 DIAGNOSIS — R2689 Other abnormalities of gait and mobility: Secondary | ICD-10-CM | POA: Diagnosis not present

## 2023-08-31 DIAGNOSIS — M6281 Muscle weakness (generalized): Secondary | ICD-10-CM | POA: Diagnosis not present

## 2023-09-02 DIAGNOSIS — M6281 Muscle weakness (generalized): Secondary | ICD-10-CM | POA: Diagnosis not present

## 2023-09-02 DIAGNOSIS — R2681 Unsteadiness on feet: Secondary | ICD-10-CM | POA: Diagnosis not present

## 2023-09-02 DIAGNOSIS — R2689 Other abnormalities of gait and mobility: Secondary | ICD-10-CM | POA: Diagnosis not present

## 2023-09-08 DIAGNOSIS — M6281 Muscle weakness (generalized): Secondary | ICD-10-CM | POA: Diagnosis not present

## 2023-09-08 DIAGNOSIS — R2681 Unsteadiness on feet: Secondary | ICD-10-CM | POA: Diagnosis not present

## 2023-09-08 DIAGNOSIS — R2689 Other abnormalities of gait and mobility: Secondary | ICD-10-CM | POA: Diagnosis not present

## 2023-09-10 DIAGNOSIS — R2689 Other abnormalities of gait and mobility: Secondary | ICD-10-CM | POA: Diagnosis not present

## 2023-09-10 DIAGNOSIS — M6281 Muscle weakness (generalized): Secondary | ICD-10-CM | POA: Diagnosis not present

## 2023-09-10 DIAGNOSIS — R2681 Unsteadiness on feet: Secondary | ICD-10-CM | POA: Diagnosis not present

## 2023-09-13 DIAGNOSIS — M6281 Muscle weakness (generalized): Secondary | ICD-10-CM | POA: Diagnosis not present

## 2023-09-13 DIAGNOSIS — R2681 Unsteadiness on feet: Secondary | ICD-10-CM | POA: Diagnosis not present

## 2023-09-13 DIAGNOSIS — R2689 Other abnormalities of gait and mobility: Secondary | ICD-10-CM | POA: Diagnosis not present

## 2023-09-14 DIAGNOSIS — R2681 Unsteadiness on feet: Secondary | ICD-10-CM | POA: Diagnosis not present

## 2023-09-14 DIAGNOSIS — R2689 Other abnormalities of gait and mobility: Secondary | ICD-10-CM | POA: Diagnosis not present

## 2023-09-14 DIAGNOSIS — M6281 Muscle weakness (generalized): Secondary | ICD-10-CM | POA: Diagnosis not present

## 2023-09-15 DIAGNOSIS — M6281 Muscle weakness (generalized): Secondary | ICD-10-CM | POA: Diagnosis not present

## 2023-09-15 DIAGNOSIS — R2689 Other abnormalities of gait and mobility: Secondary | ICD-10-CM | POA: Diagnosis not present

## 2023-09-15 DIAGNOSIS — R2681 Unsteadiness on feet: Secondary | ICD-10-CM | POA: Diagnosis not present

## 2023-09-17 DIAGNOSIS — M6281 Muscle weakness (generalized): Secondary | ICD-10-CM | POA: Diagnosis not present

## 2023-09-17 DIAGNOSIS — R2689 Other abnormalities of gait and mobility: Secondary | ICD-10-CM | POA: Diagnosis not present

## 2023-09-17 DIAGNOSIS — R2681 Unsteadiness on feet: Secondary | ICD-10-CM | POA: Diagnosis not present

## 2023-09-18 ENCOUNTER — Other Ambulatory Visit: Payer: Self-pay | Admitting: Cardiovascular Disease

## 2023-09-18 DIAGNOSIS — M6281 Muscle weakness (generalized): Secondary | ICD-10-CM | POA: Diagnosis not present

## 2023-09-18 DIAGNOSIS — R2689 Other abnormalities of gait and mobility: Secondary | ICD-10-CM | POA: Diagnosis not present

## 2023-09-18 DIAGNOSIS — R2681 Unsteadiness on feet: Secondary | ICD-10-CM | POA: Diagnosis not present

## 2023-09-20 DIAGNOSIS — R2681 Unsteadiness on feet: Secondary | ICD-10-CM | POA: Diagnosis not present

## 2023-09-20 DIAGNOSIS — R2689 Other abnormalities of gait and mobility: Secondary | ICD-10-CM | POA: Diagnosis not present

## 2023-09-20 DIAGNOSIS — M6281 Muscle weakness (generalized): Secondary | ICD-10-CM | POA: Diagnosis not present

## 2023-09-21 DIAGNOSIS — R2681 Unsteadiness on feet: Secondary | ICD-10-CM | POA: Diagnosis not present

## 2023-09-21 DIAGNOSIS — M6281 Muscle weakness (generalized): Secondary | ICD-10-CM | POA: Diagnosis not present

## 2023-09-21 DIAGNOSIS — R2689 Other abnormalities of gait and mobility: Secondary | ICD-10-CM | POA: Diagnosis not present

## 2023-09-22 DIAGNOSIS — R2681 Unsteadiness on feet: Secondary | ICD-10-CM | POA: Diagnosis not present

## 2023-09-22 DIAGNOSIS — R2689 Other abnormalities of gait and mobility: Secondary | ICD-10-CM | POA: Diagnosis not present

## 2023-09-22 DIAGNOSIS — M6281 Muscle weakness (generalized): Secondary | ICD-10-CM | POA: Diagnosis not present

## 2023-09-23 DIAGNOSIS — R2689 Other abnormalities of gait and mobility: Secondary | ICD-10-CM | POA: Diagnosis not present

## 2023-09-23 DIAGNOSIS — R2681 Unsteadiness on feet: Secondary | ICD-10-CM | POA: Diagnosis not present

## 2023-09-23 DIAGNOSIS — M6281 Muscle weakness (generalized): Secondary | ICD-10-CM | POA: Diagnosis not present

## 2023-09-24 NOTE — Progress Notes (Addendum)
Cardiology Clinic Note   Patient Name: Kathryn Carlson Date of Encounter: 09/27/2023  Primary Care Provider:  Geoffry Paradise, MD Primary Cardiologist:  Nanetta Batty, MD  Patient Profile    Kathryn Carlson 89 year old female presents the clinic today for follow-up evaluation of her hypertension and PVCs.  Past Medical History    Past Medical History:  Diagnosis Date   BCC (basal cell carcinoma of skin) 01/09/2009   Lower Central Back (tx p bx)   CAD (coronary artery disease)    possible ant wall MI ('97) - cath & CABG x5   Exogenous obesity    GERD (gastroesophageal reflux disease)    History of nuclear stress test 09/2012   lexiscan; mild perfusion defect in apical anterior & apical region (infarct/scar) - no significant ischemia, low risk    History of total bilateral knee replacement    Hypertension    Hypothyroidism    Nodular basal cell carcinoma (BCC) 08/17/2017   Left Calf (tx p bx)   PVC's (premature ventricular contractions)    SCC (squamous cell carcinoma) [place] 01/29/2004   Right Forearm (Cx3,5FU)   SCCA (squamous cell carcinoma) of skin 03/13/2005   Mid Upper Back   SCCA (squamous cell carcinoma) of skin 09/07/2005   Left Elbow(Cx3,5FU)   SCCA (squamous cell carcinoma) of skin 01/25/2006   Left Brow(in situ) (Cx3,5FU)   SCCA (squamous cell carcinoma) of skin 04/27/2006   Inner Left Shoulder(Keratoacanthoma) (Exc.)   SCCA (squamous cell carcinoma) of skin 03/22/2007   Left Temple(in situ) and Bridge of Nose(in situ) (Cs3,5FU)   SCCA (squamous cell carcinoma) of skin 05/12/2007   Top of Scalp(Cx3,5FU)   SCCA (squamous cell carcinoma) of skin 01/28/2011   Right Upper Arm(in situ)   SCCA (squamous cell carcinoma) of skin 07/26/2012   Glabella, Inferior Tip (Cx3,5FU)   SCCA (squamous cell carcinoma) of skin 03/08/2013   Left Front Scalp(in situ) and Upper Nose(in situ) (Cx3,5FU)   SCCA (squamous cell carcinoma) of skin 07/24/2013   Right Lower  Leg(Keratoacanthoma)   SCCA (squamous cell carcinoma) of skin 09/11/2015   Left Scalp(in situ) (Cx3,5FU)   SCCA (squamous cell carcinoma) of skin 01/15/2016   Left Forearm(in situ) and Left Temple(in situ) (tx p bx)   SCCA (squamous cell carcinoma) of skin 10/01/2016   Left Mid Forearm(in situ)(tx p bx) and Left Front Scalp(watch)   SCCA (squamous cell carcinoma) of skin 11/12/2016   Left Temple(Keratoacanthoma) (watch)   SCCA (squamous cell carcinoma) of skin 02/11/2017   Top Left Hand(Keratoacanthoma) (tx p bx)   Squamous cell carcinoma in situ (SCCIS) 11/18/1999   Above Left Outer Eyebrow   Squamous cell carcinoma of scalp    removed - Dr. Jorja Loa   Superficial basal cell carcinoma (BCC) 07/14/2004   Left Scapula (Cx3,5FU)   Past Surgical History:  Procedure Laterality Date   ABDOMINAL HYSTERECTOMY  1984   APPENDECTOMY  1941   Carotid Doppler  12/07/2012   R & L ICA - 0-49% diameter reduction   CORONARY ARTERY BYPASS GRAFT  09/1996   cath & CABG x5 LIMA-LAD, SVG-sequential OD & OM, SVG sequential to PDA & PLA (Dr. Donata Clay)   REPLACEMENT TOTAL KNEE Bilateral 2001 & 2003   TONSILLECTOMY     TRANSTHORACIC ECHOCARDIOGRAM  08/23/2013   EF 50-55%, LV cavity size mod reduced, mild LVH, mild conc hypertrophy; mild AV stenosis & mild regurg; mild MR - ordered for murmur    Allergies  Allergies  Allergen Reactions  Meloxicam     Other reaction(s): Unknown   Novocain [Procaine]     History of Present Illness    [name] has a PMH of hypertension, NSVT, frequent PVCs, aortic stenosis, coronary artery disease status post CABG x 3 1997, dyslipidemia, symptomatic bradycardia, HLD, and lower extremity edema. Echocardiogram 03/04/2020 showed mild aortic stenosis, moderate aortic regurgitation, 50-55% LVEF, and G2 DD.     She is a former patient of [name] and [name]. She is now followed by [name]. She had an MI at age 60. Underwent nuclear stress test 2013 which showed  no ischemia. She was last seen by [name] on 3/21 during that time she was doing well.  She contacted the nurse triage line on 09/03/20 with concerns about swelling in her lower extremities. She was started on HCTZ 12.5 mg daily. She was seen in this clinic by Marjie Skiff, PA-C on 10/02/2020. During that time she was noted to have continued lower extremity edema. Her creatinine was stable. She did have some increased dyspnea with exertion that was felt to be related to deconditioning. She denied orthopnea or PND. Her weight continues to be stable from a previous visit. She denied palpitations, lightheadedness, dizziness. She did report some numbness and bilateral feet which she attributed to neuropathy. Her main complaint during the visit was related to her memory loss. She reported that this was not a new occurrence and she had noted increased forgetfulness over the last several years. She was instructed to follow-up with her PCP.  She presented to the clinic 01/28/21 for follow-up evaluation stated she was having trouble sleeping and had a poor appetite.  She had been trying to find low-dose melatonin that was suggested by her PCP.  However, she was only  able to find 10 mg dosing at the store.  I instructed her to look in the children section.  She had been supplementing her diet with boost supplement drinks.  She stated that she had lost around 16 pounds.  She reported that she  had cravings for food however when the food arrived she did not have any desire to eat.  She remained physically active walking at friend's home daily.  She noticed brief occasional episodes of what she described as "flutters".  She reported they lasted for only seconds and then dissipated on their own.  She reported that she had labs drawn recently at Dr. Lanell Matar office, we will requested.  I recommended that she increase her physical activity as tolerated, continue her heart healthy diet, and follow-up in 6 months.  She was  seen by [name] in follow-up on 08/05/2021.  During that time her admission to [facility] 07/08/2021 for 3 days with NSTEMI was reviewed.  Her symptoms included pain between her shoulder blades.  Her troponins rose to 5000.  Echocardiogram showed normal LV function and moderate aortic stenosis.  Conservative management was pursued.  She was started on Plavix and denied further symptoms at her follow-up visit.  She presented to the clinic 01/29/2022 for follow-up evaluation stated she felt well .  She continued to walk most days of the week at her assisted living facility (friends home).  Her blood pressure initially  was 158 over 70s.  On recheck it was 128/74.  We reviewed the importance of heart healthy low-salt diet.  She expressed understanding.  She continued to tolerate her medications well without side effects.  She did report that she has some trouble swallowing larger pills.  I  gave her a pill splitter today.  We  planned follow-up for 6 months.  She was seen in follow-up by [name] on 07/31/2022.  During that time she continued to be stable.  She denied chest pain shortness of breath.  Her echocardiogram 07/09/2021 showed normal LV function with G2 DD and moderate aortic stenosis.    She presented to the clinic 02/03/23 for follow-up evaluation stated she continued to live independently at a friend's home.  She was able to take her own medications, make her walk for her meals.  She was a little bit frustrated that her weight was up 3 pounds.  We reviewed healthy weights.  I encouraged her to continue her physical activity.  Her blood pressure initially was 153/92 and on recheck was 158/72.  Her blood pressure was well-controlled at home.  I requested that she send [place] a 2-week blood pressure log.  I continued her current medication regimen and planned follow-up in 6 months.  She was seen in follow-up by Carlos Levering NP-C NP on 07/23/2023.  She had been in the emergency department on  07/07/2023.  She complained of palpitations and bilateral lower extremity edema.  Her EKG showed sinus rhythm with prolonged PR interval.  Her BNP was elevated at 521.  Her blood pressure was noted to be 218/74.  She received Imdur and her blood pressure improved.  During her follow-up visit she presented with a friend.  She was doing well since being in the emergency department.  She denied shortness of breath with exertion.  She denied chest pain.  She was noted to have mild lower extremity edema.  She did note occasional palpitations which were unchanged.  She noted decreased energy.  She was still able to do ADLs and use her walker to walk the long hallways at her living facility.  She was seen in the emergency department on 08/10/2023 and discharged on 08/11/2023.  She was noted to have hypokalemia and recovering from COVID-19.  She was concerned about generalized fatigue.  She reported poor appetite.  She was hydrating well.  She continues to be active walking.  She denied chest pain and difficulty with her breathing.  She presents to the clinic today for follow-up evaluation and states she continues to recover from COVID.  She notes that she still has some fatigue.  She presents with her daughter.  She weighs 161 pounds today.  She feels that her breathing is increased with increased activity.  She has lower extremity edema 2+ pitting to her mid shin, right greater than left.  I will prescribe Lasix 20 mg daily x 3 days then stop.  We will also increase her potassium to 20 mill equivalents x 3 days then have her resume her 10 mill equivalents.  I recommended lower extremity support stockings, continued low-salt diet, and continued fluid consumption.  We will plan follow-up in 1 to 2 weeks and plan a BMP in 1 week.  Today she denies chest pain, shortness of breath, lower extremity edema, fatigue, palpitations, melena, hematuria, hemoptysis, diaphoresis, weakness, presyncope, syncope, orthopnea, and  PND.    Home Medications    Prior to Admission medications   Medication Sig Start Date End Date Taking? Authorizing Provider  aspirin 81 MG tablet Take 81 mg by mouth daily.     [provider]  atorvastatin (LIPITOR) 40 MG tablet TAKE 1 TABLET BY MOUTH EVERY DAY 07/29/20   Runell Gess, MD  augmented betamethasone dipropionate (DIPROLENE-AF) 0.05 %  cream Apply topically 2 (two) times daily.    [provider]  chloridazePOXIDE-amitriptyline (LIMBITROL) 5-12.5 MG per tablet Take 1 tablet by mouth daily.    [provider]  cholecalciferol (VITAMIN D) 1000 UNITS tablet Take 2,000 Units by mouth daily.    [provider]  estradiol (ESTRACE) 1 MG tablet Take 1 mg by mouth daily.    [provider]  fluocinonide (LIDEX) 0.05 % external solution Apply 1 application topically 2 (two) times daily.     [provider]  hydrochlorothiazide (MICROZIDE) 12.5 MG capsule Take 1 capsule (12.5 mg total) by mouth daily. 09/04/20 12/03/20  Runell Gess, MD  hydrocortisone 2.5 % cream APPLY TO RASH/ITCHY SKIN AT BEDTIME 11/18/20   Janalyn Harder, MD  Influenza vac split quadrivalent PF (FLUZONE HIGH-DOSE) 0.5 ML injection Fluzone High-Dose 2019-20 (PF) 180 mcg/0.5 mL intramuscular syringe  TO BE ADMINISTERED BY PHARMACIST FOR IMMUNIZATION    [provider]  isosorbide mononitrate (IMDUR) 30 MG 24 hr tablet TAKE 1 TABLET BY MOUTH EVERY DAY 03/12/20   Runell Gess, MD  levothyroxine (SYNTHROID, LEVOTHROID) 75 MCG tablet Take 75 mcg by mouth daily before breakfast.    [provider]  metoprolol succinate (TOPROL-XL) 100 MG 24 hr tablet TAKE 1 TABLET (100 MG TOTAL) BY MOUTH DAILY. TAKE WITH OR IMMEDIATELY FOLLOWING A MEAL. 10/28/20 01/26/21  Runell Gess, MD  Multiple Vitamin (MULTIVITAMIN) capsule Take 1 capsule by mouth daily.    [provider]  mupirocin ointment (BACTROBAN) 2 % Apply 1 application topically 2 (two)  times daily. 10/17/20   Janalyn Harder, MD  niacin 500 MG tablet Take 500 mg by mouth daily with breakfast.    [provider]  olmesartan (BENICAR) 40 MG tablet Take 1 tablet (40 mg total) by mouth daily. 04/08/20   Runell Gess, MD  pantoprazole (PROTONIX) 40 MG tablet TAKE 1 TABLET BY MOUTH EVERY DAY Patient taking differently: Take by mouth as needed.  12/05/13   Hilty, Lisette Abu, MD  timolol (TIMOPTIC) 0.5 % ophthalmic solution Place 1 drop into the right eye every morning. 08/20/20   [provider]  TIMOLOL HEMIHYDRATE OP Place 1 drop into the right eye daily.    [provider]  VITAMIN E PO Take 400 mg by mouth daily. Plus D    [provider]    Family History    Family History  Problem Relation Age of Onset   Heart attack Mother    Heart attack Father    Cancer Sister    Diabetes Sister    Heart Problems Sister    She indicated that her mother is deceased. She indicated that her father is deceased. She indicated that her maternal grandmother is deceased. She indicated that her maternal grandfather is deceased. She indicated that her paternal grandmother is deceased. She indicated that her paternal grandfather is deceased.  Social History    Social History   Socioeconomic History   Marital status: Widowed    Spouse name: Not on file   Number of children: 4   Years of education: 82   Highest education level: Not on file  Occupational History   Not on file  Tobacco Use   Smoking status: Never   Smokeless tobacco: Never  Substance and Sexual Activity   Alcohol use: Yes    Comment: "a drink of wine every now and then"   Drug use: Not on file   Sexual activity: Not on file  Other Topics Concern   Not on file  Social History Narrative   Not on file   Social Determinants of Health   Financial Resource Strain: Not on file  Food Insecurity: Not on file  Transportation Needs: Not on file  Physical Activity: Not on file   Stress: Not on file  Social Connections: Not on file  Intimate Partner Violence: Not on file     Review of Systems    General:  No chills, fever, night sweats or weight changes.  Cardiovascular:  No chest pain, dyspnea on exertion, edema, orthopnea, palpitations, paroxysmal nocturnal dyspnea. Dermatological: No rash, lesions/masses Respiratory: No cough, dyspnea Urologic: No hematuria, dysuria Abdominal:   No nausea, vomiting, diarrhea, bright red blood per rectum, melena, or hematemesis Neurologic:  No visual changes, wkns, changes in mental status. All other systems reviewed and are otherwise negative except as noted above.  Physical Exam    VS:  BP 126/74   Pulse 67   Ht 5\' 4"  (1.626 m)   Wt 161 lb 3.2 oz (73.1 kg)   SpO2 97%   BMI 27.67 kg/m  , BMI Body mass index is 27.67 kg/m. GEN: Well nourished, well developed, in no acute distress. HEENT: normal. Neck: Supple, no JVD, carotid bruits, or masses. Cardiac: RRR, 2/6 systolic murmur heard along rightsternal border.  , rubs, or gallops. No clubbing, cyanosis, right greater than left 2+ pitting edema to midshin .  Radials/DP/PT 2+ and equal bilaterally.  Respiratory:  Respirations regular and unlabored, clear to auscultation bilaterally. GI: Soft, nontender, nondistended, BS + x 4. MS: no deformity or atrophy. Skin: warm and dry, no rash. Neuro:  Strength and sensation are intact. Psych: Normal affect.  Accessory Clinical Findings    Recent Labs: 07/07/2023: B Natriuretic Peptide 521.0 08/10/2023: ALT 14; Hemoglobin 10.1; Magnesium 1.7; Platelets 196 08/11/2023: BUN 7; Creatinine, Ser 0.79; Potassium 3.5; Sodium 133   Recent Lipid Panel    Component Value Date/Time   CHOL 119 07/09/2021 0038   TRIG 97 07/09/2021 0038   HDL 40 (L) 07/09/2021 0038   CHOLHDL 3.0 07/09/2021 0038   VLDL 19 07/09/2021 0038   LDLCALC 60 07/09/2021 0038    ECG personally reviewed by me today-none today.  Echocardiogram  03/04/2020 IMPRESSIONS     1. Mild aortic stenosis but moderate aortic regurgitation.   2. Left ventricular ejection fraction, by estimation, is 50 to 55%. The  left ventricle has low normal function. The left ventricle has no regional  wall motion abnormalities. Left ventricular diastolic parameters are  consistent with Grade II diastolic  dysfunction (pseudonormalization).   3. Right ventricular systolic function is normal. The right ventricular  size is normal. There is mildly elevated pulmonary artery systolic  pressure.   4. The mitral valve is normal in structure. Trivial mitral valve  regurgitation. No evidence of mitral stenosis.   5. The aortic valve is tricuspid. Aortic valve regurgitation is moderate.  Mild aortic valve stenosis.   6. The inferior vena cava is normal in size with <50% respiratory  variability, suggesting right atrial pressure of 8 mmHg.   Echocardiogram 07/09/2021 IMPRESSIONS     1. Left ventricular ejection fraction, by estimation, is 50 to 55%. The  left ventricle has low normal function. The left ventricle demonstrates  regional wall motion abnormalities with basal inferior and basal  inferolateral akinesis. There is mild left  ventricular hypertrophy. Left ventricular diastolic parameters are  consistent with Grade II diastolic dysfunction (pseudonormalization).   2. Right  ventricular systolic function is normal. The right ventricular  size is normal. Tricuspid regurgitation signal is inadequate for assessing  PA pressure.   3. The mitral valve is abnormal. Mild mitral valve regurgitation. No  evidence of mitral stenosis. Moderate mitral annular calcification.   4. The aortic valve is tricuspid. Aortic valve regurgitation is mild.  Moderate aortic valve stenosis (suspect paradoxical low flow/low gradient  moderate AS). Aortic valve area, by VTI measures 1.01 cm. Aortic valve  mean gradient measures 12.0 mmHg.   5. The IVC was not visualized.    Assessment & Plan   1.Essential hypertension-BP today 126/74.   Maintain blood pressure log Continue metoprolol, Imdur Heart healthy low-sodium diet-reviewed Continue current physical activity  Coronary artery disease-denies episodes of chest pain at rest or with exertion.  Status post NSTEMI 07/08/2021.    Conservative management recommended.  Plavix started at that time.  Status post CABGx3 in 1997. Nuclear stress test 2013 showed no ischemia.  Continues to be somewhat physically active.   Continue Plavix, aspirin, metoprolol, Imdur, Lipitor Heart healthy low-sodium diet-reviewed  Mild aortic stenosis/moderate aortic insufficiency-breathing at baseline. Echocardiogram 7/22 showed LVEF 50-55%, moderate AS with mild aortic regurgitation.  Lower extremity edema-right greater than left 2+ pitting edema to midshin. Continue hydrochlorothiazide Lasix 20 mg daily x 3 days then stop.  We will also increase her potassium to 20 mill equivalents x 3 days then have her resume her 10 mill equivalents. Heart healthy low-sodium diet Increase physical activity as tolerated Daily weights-contact office with a weight increase of 3 pounds overnight or 5 pounds in 1 week Request labs from PCP BMP in 1 week  Hyperlipidemia-LDL 92 on 11/24/21  Continue atorvastatin Follows with PCP.   Disposition: Follow-up with [name] or me in 6 months.  Thomasene Ripple. Judia Arnott NP-C    09/27/2023, 12:31 PM [facility] [address] Office [phone] Fax [phone]  Notice: This dictation was prepared with Dragon dictation along with smaller phrase technology. Any transcriptional errors that result from this process are unintentional and may not be corrected upon review.  I spent 14 minutes examining this patient, reviewing medications, and using patient centered shared decision making involving her cardiac care.  Prior to her visit I spent greater than 20 minutes  reviewing her past medical history,  medications, and prior cardiac tests.

## 2023-09-27 ENCOUNTER — Encounter: Payer: Self-pay | Admitting: General Practice

## 2023-09-27 ENCOUNTER — Ambulatory Visit: Payer: Medicare Other | Attending: Cardiovascular Disease | Admitting: General Practice

## 2023-09-27 VITALS — BP 126/74 | HR 67 | Ht 64.0 in | Wt 161.2 lb

## 2023-09-27 DIAGNOSIS — E785 Hyperlipidemia, unspecified: Secondary | ICD-10-CM | POA: Insufficient documentation

## 2023-09-27 DIAGNOSIS — Z951 Presence of aortocoronary bypass graft: Secondary | ICD-10-CM | POA: Insufficient documentation

## 2023-09-27 DIAGNOSIS — I10 Essential (primary) hypertension: Secondary | ICD-10-CM | POA: Insufficient documentation

## 2023-09-27 DIAGNOSIS — R002 Palpitations: Secondary | ICD-10-CM | POA: Insufficient documentation

## 2023-09-27 DIAGNOSIS — I2510 Atherosclerotic heart disease of native coronary artery without angina pectoris: Secondary | ICD-10-CM | POA: Diagnosis not present

## 2023-09-27 DIAGNOSIS — R600 Localized edema: Secondary | ICD-10-CM | POA: Diagnosis not present

## 2023-09-27 MED ORDER — POTASSIUM CHLORIDE ER 10 MEQ PO TBCR: 10.0000 meq | EXTENDED_RELEASE_TABLET | Freq: Every day | ORAL | 3 refills | Status: DC

## 2023-09-27 MED ORDER — FUROSEMIDE 20 MG PO TABS
20.0000 mg | ORAL_TABLET | Freq: Every day | ORAL | 0 refills | Status: DC
Start: 2023-09-27 — End: 2023-12-07

## 2023-09-27 NOTE — Patient Instructions (Signed)
Medication Instructions:  TAKE FUROSEMIDE x3DAYS THEN STOP TAKE POTASSIUM 20 MEQ x3DAYS THEN TAKE *If you need a refill on your cardiac medications before your next appointment, please call your pharmacy*  Lab Work: BMET IN 1 WEEK-THIS IS NON FASTING If you have labs (blood work) drawn today and your tests are completely normal, you will receive your results only by:  MyChart Message (if you have MyChart) OR  A paper copy in the mail If you have any lab test that is abnormal or we need to change your treatment, we will call you to review the results.  Other Instructions TAKE AND LOG YOUR WEIGHT INCREASE PHYSICAL ACTIVITY-AS TOLERATED PLEASE READ AND FOLLOW ATTACHED  SALTY 6  PLEASE PURCHASE AND WEAR COMPRESSION STOCKINGS DAILY AND TAKE OFF AT BEDTIME.   Follow-Up: At [facility], you and your health needs are our priority.  As part of our continuing mission to provide you with exceptional heart care, we have created designated Provider Care Teams.  These Care Teams include your primary Cardiologist (physician) and Advanced Practice Providers (APPs -  Physician Assistants and Nurse Practitioners) who all work together to provide you with the care you need, when you need it.  We recommend signing up for the patient portal called "MyChart".  Sign up information is provided on this After Visit Summary.  MyChart is used to connect with patients for Virtual Visits (Telemedicine).  Patients are able to view lab/test results, encounter notes, upcoming appointments, etc.  Non-urgent messages can be sent to your provider as well.   To learn more about what you can do with MyChart, go to [email or web address].    Your next appointment:   1-2 week(s)  Provider:   Nanetta Batty, MD OR Edd Fabian, FNP-C          PLEASE PURCHASE AND WEAR COMPRESSION STOCKINGS DAILY AND TAKE OFF AT BEDTIME. Compression stockings are elastic socks that squeeze the legs. They help to  increase blood flow to the legs and to decrease swelling in the legs from fluid retention, and reduce the chance of developing blood clots in the lower legs. Please put on in the AM when dressing and off at night when dressing for bed.  LET THEM KNOW THAT YOU NEED KNEE HIGH'S WITH COMPRESSION OF 15-20 mmhg.  [employer]  [address] ([address] [phone]); [place], [place] [address]; [phone]  EMAIL   [email or web address].  PLEASE MAKE SURE TO ELEVATE YOUR FEET & LEGS WHILE SITTING, THIS WILL HELP WITH THE SWELLING ALSO.

## 2023-09-28 DIAGNOSIS — M6281 Muscle weakness (generalized): Secondary | ICD-10-CM | POA: Diagnosis not present

## 2023-09-28 DIAGNOSIS — R2681 Unsteadiness on feet: Secondary | ICD-10-CM | POA: Diagnosis not present

## 2023-09-28 DIAGNOSIS — R2689 Other abnormalities of gait and mobility: Secondary | ICD-10-CM | POA: Diagnosis not present

## 2023-09-29 DIAGNOSIS — M6281 Muscle weakness (generalized): Secondary | ICD-10-CM | POA: Diagnosis not present

## 2023-09-29 DIAGNOSIS — R2681 Unsteadiness on feet: Secondary | ICD-10-CM | POA: Diagnosis not present

## 2023-09-29 DIAGNOSIS — R2689 Other abnormalities of gait and mobility: Secondary | ICD-10-CM | POA: Diagnosis not present

## 2023-09-29 NOTE — Addendum Note (Signed)
Addended by: Alyson Ingles on: 09/29/2023 11:52 AM   Modules accepted: Orders

## 2023-10-01 DIAGNOSIS — R2681 Unsteadiness on feet: Secondary | ICD-10-CM | POA: Diagnosis not present

## 2023-10-01 DIAGNOSIS — R2689 Other abnormalities of gait and mobility: Secondary | ICD-10-CM | POA: Diagnosis not present

## 2023-10-01 DIAGNOSIS — M6281 Muscle weakness (generalized): Secondary | ICD-10-CM | POA: Diagnosis not present

## 2023-10-04 ENCOUNTER — Non-Acute Institutional Stay (INDEPENDENT_AMBULATORY_CARE_PROVIDER_SITE_OTHER): Payer: Self-pay | Admitting: Sports Medicine

## 2023-10-04 DIAGNOSIS — R2681 Unsteadiness on feet: Secondary | ICD-10-CM | POA: Diagnosis not present

## 2023-10-04 DIAGNOSIS — R2689 Other abnormalities of gait and mobility: Secondary | ICD-10-CM | POA: Diagnosis not present

## 2023-10-04 DIAGNOSIS — M6281 Muscle weakness (generalized): Secondary | ICD-10-CM | POA: Diagnosis not present

## 2023-10-04 NOTE — Progress Notes (Unsigned)
This encounter was created in error - please disregard.

## 2023-10-05 DIAGNOSIS — R002 Palpitations: Secondary | ICD-10-CM | POA: Diagnosis not present

## 2023-10-05 DIAGNOSIS — E785 Hyperlipidemia, unspecified: Secondary | ICD-10-CM | POA: Diagnosis not present

## 2023-10-05 DIAGNOSIS — I10 Essential (primary) hypertension: Secondary | ICD-10-CM | POA: Diagnosis not present

## 2023-10-05 DIAGNOSIS — R600 Localized edema: Secondary | ICD-10-CM | POA: Diagnosis not present

## 2023-10-05 DIAGNOSIS — Z951 Presence of aortocoronary bypass graft: Secondary | ICD-10-CM | POA: Diagnosis not present

## 2023-10-05 DIAGNOSIS — I2510 Atherosclerotic heart disease of native coronary artery without angina pectoris: Secondary | ICD-10-CM | POA: Diagnosis not present

## 2023-10-05 LAB — BASIC METABOLIC PANEL
BUN: 14 mg/dL (ref 7–25)
CO2: 28 mmol/L (ref 20–32)
Calcium: 8.6 mg/dL (ref 8.6–10.4)
Chloride: 104 mmol/L (ref 98–110)
Creat: 0.87 mg/dL (ref 0.60–0.95)
Glucose, Bld: 90 mg/dL (ref 65–99)
Potassium: 4.2 mmol/L (ref 3.5–5.3)
Sodium: 138 mmol/L (ref 135–146)

## 2023-10-06 DIAGNOSIS — M6281 Muscle weakness (generalized): Secondary | ICD-10-CM | POA: Diagnosis not present

## 2023-10-06 DIAGNOSIS — R2681 Unsteadiness on feet: Secondary | ICD-10-CM | POA: Diagnosis not present

## 2023-10-06 DIAGNOSIS — R2689 Other abnormalities of gait and mobility: Secondary | ICD-10-CM | POA: Diagnosis not present

## 2023-10-08 DIAGNOSIS — R2689 Other abnormalities of gait and mobility: Secondary | ICD-10-CM | POA: Diagnosis not present

## 2023-10-08 DIAGNOSIS — M6281 Muscle weakness (generalized): Secondary | ICD-10-CM | POA: Diagnosis not present

## 2023-10-08 DIAGNOSIS — R2681 Unsteadiness on feet: Secondary | ICD-10-CM | POA: Diagnosis not present

## 2023-10-11 DIAGNOSIS — R2681 Unsteadiness on feet: Secondary | ICD-10-CM | POA: Diagnosis not present

## 2023-10-11 DIAGNOSIS — R2689 Other abnormalities of gait and mobility: Secondary | ICD-10-CM | POA: Diagnosis not present

## 2023-10-11 DIAGNOSIS — M6281 Muscle weakness (generalized): Secondary | ICD-10-CM | POA: Diagnosis not present

## 2023-10-11 NOTE — Progress Notes (Unsigned)
Cardiology Clinic Note   Patient Name: Kathryn Carlson Date of Encounter: 10/13/2023  Primary Care Provider:  Geoffry Paradise, MD Primary Cardiologist:  Nanetta Batty, MD  Patient Profile    Kathryn Ide. Chamber 89 year old female presents the clinic today for follow-up evaluation of her diastolic CHF.  Past Medical History    Past Medical History:  Diagnosis Date   BCC (basal cell carcinoma of skin) 01/09/2009   Lower Central Back (tx p bx)   CAD (coronary artery disease)    possible ant wall MI ('97) - cath & CABG x5   Exogenous obesity    GERD (gastroesophageal reflux disease)    History of nuclear stress test 09/2012   lexiscan; mild perfusion defect in apical anterior & apical region (infarct/scar) - no significant ischemia, low risk    History of total bilateral knee replacement    Hypertension    Hypothyroidism    Nodular basal cell carcinoma (BCC) 08/17/2017   Left Calf (tx p bx)   PVC's (premature ventricular contractions)    SCC (squamous cell carcinoma) [place] 01/29/2004   Right Forearm (Cx3,5FU)   SCCA (squamous cell carcinoma) of skin 03/13/2005   Mid Upper Back   SCCA (squamous cell carcinoma) of skin 09/07/2005   Left Elbow(Cx3,5FU)   SCCA (squamous cell carcinoma) of skin 01/25/2006   Left Brow(in situ) (Cx3,5FU)   SCCA (squamous cell carcinoma) of skin 04/27/2006   Inner Left Shoulder(Keratoacanthoma) (Exc.)   SCCA (squamous cell carcinoma) of skin 03/22/2007   Left Temple(in situ) and Bridge of Nose(in situ) (Cs3,5FU)   SCCA (squamous cell carcinoma) of skin 05/12/2007   Top of Scalp(Cx3,5FU)   SCCA (squamous cell carcinoma) of skin 01/28/2011   Right Upper Arm(in situ)   SCCA (squamous cell carcinoma) of skin 07/26/2012   Glabella, Inferior Tip (Cx3,5FU)   SCCA (squamous cell carcinoma) of skin 03/08/2013   Left Front Scalp(in situ) and Upper Nose(in situ) (Cx3,5FU)   SCCA (squamous cell carcinoma) of skin 07/24/2013   Right Lower  Leg(Keratoacanthoma)   SCCA (squamous cell carcinoma) of skin 09/11/2015   Left Scalp(in situ) (Cx3,5FU)   SCCA (squamous cell carcinoma) of skin 01/15/2016   Left Forearm(in situ) and Left Temple(in situ) (tx p bx)   SCCA (squamous cell carcinoma) of skin 10/01/2016   Left Mid Forearm(in situ)(tx p bx) and Left Front Scalp(watch)   SCCA (squamous cell carcinoma) of skin 11/12/2016   Left Temple(Keratoacanthoma) (watch)   SCCA (squamous cell carcinoma) of skin 02/11/2017   Top Left Hand(Keratoacanthoma) (tx p bx)   Squamous cell carcinoma in situ (SCCIS) 11/18/1999   Above Left Outer Eyebrow   Squamous cell carcinoma of scalp    removed - Dr. Jorja Loa   Superficial basal cell carcinoma (BCC) 07/14/2004   Left Scapula (Cx3,5FU)   Past Surgical History:  Procedure Laterality Date   ABDOMINAL HYSTERECTOMY  1984   APPENDECTOMY  1941   Carotid Doppler  12/07/2012   R & L ICA - 0-49% diameter reduction   CORONARY ARTERY BYPASS GRAFT  09/1996   cath & CABG x5 LIMA-LAD, SVG-sequential OD & OM, SVG sequential to PDA & PLA (Dr. Donata Clay)   REPLACEMENT TOTAL KNEE Bilateral 2001 & 2003   TONSILLECTOMY     TRANSTHORACIC ECHOCARDIOGRAM  08/23/2013   EF 50-55%, LV cavity size mod reduced, mild LVH, mild conc hypertrophy; mild AV stenosis & mild regurg; mild MR - ordered for murmur    Allergies  Allergies  Allergen Reactions   Meloxicam  Other reaction(s): Unknown   Novocain [Procaine]     History of Present Illness    Kathryn Carlson has a PMH of hypertension, NSVT, frequent PVCs, aortic stenosis, coronary artery disease status post CABG x 3 1997, dyslipidemia, symptomatic bradycardia, HLD, and lower extremity edema. Echocardiogram 03/04/2020 showed mild aortic stenosis, moderate aortic regurgitation, 50-55% LVEF, and G2 DD.     She is a former patient of [name] and [name]. She is now followed by [name]. She had an MI at age 73. Underwent nuclear stress test 2013 which showed  no ischemia. She was last seen by [name] on 3/21 during that time she was doing well.  She contacted the nurse triage line on 09/03/20 with concerns about swelling in her lower extremities. She was started on HCTZ 12.5 mg daily. She was seen in this clinic by Marjie Skiff, PA-C on 10/02/2020. During that time she was noted to have continued lower extremity edema. Her creatinine was stable. She did have some increased dyspnea with exertion that was felt to be related to deconditioning. She denied orthopnea or PND. Her weight continues to be stable from a previous visit. She denied palpitations, lightheadedness, dizziness. She did report some numbness and bilateral feet which she attributed to neuropathy. Her main complaint during the visit was related to her memory loss. She reported that this was not a new occurrence and she had noted increased forgetfulness over the last several years. She was instructed to follow-up with her PCP.  She presented to the clinic 01/28/21 for follow-up evaluation stated she was having trouble sleeping and had a poor appetite.  She had been trying to find low-dose melatonin that was suggested by her PCP.  However, she was only  able to find 10 mg dosing at the store.  I instructed her to look in the children section.  She had been supplementing her diet with boost supplement drinks.  She stated that she had lost around 16 pounds.  She reported that she  had cravings for food however when the food arrived she did not have any desire to eat.  She remained physically active walking at friend's home daily.  She noticed brief occasional episodes of what she described as "flutters".  She reported they lasted for only seconds and then dissipated on their own.  She reported that she had labs drawn recently at Dr. Lanell Matar office, we will requested.  I recommended that she increase her physical activity as tolerated, continue her heart healthy diet, and follow-up in 6 months.  She was  seen by [name] in follow-up on 08/05/2021.  During that time her admission to [facility] 07/08/2021 for 3 days with NSTEMI was reviewed.  Her symptoms included pain between her shoulder blades.  Her troponins rose to 5000.  Echocardiogram showed normal LV function and moderate aortic stenosis.  Conservative management was pursued.  She was started on Plavix and denied further symptoms at her follow-up visit.  She presented to the clinic 01/29/2022 for follow-up evaluation stated she felt well .  She continued to walk most days of the week at her assisted living facility (friends home).  Her blood pressure initially  was 158 over 70s.  On recheck it was 128/74.  We reviewed the importance of heart healthy low-salt diet.  She expressed understanding.  She continued to tolerate her medications well without side effects.  She did report that she has some trouble swallowing larger pills.  I  gave her  a pill splitter today.  We  planned follow-up for 6 months.  She was seen in follow-up by [name] on 07/31/2022.  During that time she continued to be stable.  She denied chest pain shortness of breath.  Her echocardiogram 07/09/2021 showed normal LV function with G2 DD and moderate aortic stenosis.    She presented to the clinic 02/03/23 for follow-up evaluation stated she continued to live independently at a friend's home.  She was able to take her own medications, make her walk for her meals.  She was a little bit frustrated that her weight was up 3 pounds.  We reviewed healthy weights.  I encouraged her to continue her physical activity.  Her blood pressure initially was 153/92 and on recheck was 158/72.  Her blood pressure was well-controlled at home.  I requested that she send [place] a 2-week blood pressure log.  I continued her current medication regimen and planned follow-up in 6 months.  She was seen in follow-up by Carlos Levering NP-C NP on 07/23/2023.  She had been in the emergency department on  07/07/2023.  She complained of palpitations and bilateral lower extremity edema.  Her EKG showed sinus rhythm with prolonged PR interval.  Her BNP was elevated at 521.  Her blood pressure was noted to be 218/74.  She received Imdur and her blood pressure improved.  During her follow-up visit she presented with a friend.  She was doing well since being in the emergency department.  She denied shortness of breath with exertion.  She denied chest pain.  She was noted to have mild lower extremity edema.  She did note occasional palpitations which were unchanged.  She noted decreased energy.  She was still able to do ADLs and use her walker to walk the long hallways at her living facility.  She was seen in the emergency department on 08/10/2023 and discharged on 08/11/2023.  She was noted to have hypokalemia and recovering from COVID-19.  She was concerned about generalized fatigue.  She reported poor appetite.  She was hydrating well.  She continues to be active walking.  She denied chest pain and difficulty with her breathing.  She presented to the clinic 09/27/23  for follow-up evaluation and stated she continued to recover from COVID.  She noted that she still had some fatigue.  She presented with her daughter.  She weight was 161 pounds.  She felt that her breathing was increased with increased activity.  She had lower extremity edema 2+ pitting to her mid shin, right greater than left.  I  prescribed Lasix 20 mg daily x 3 days then planned to stop.  I increased her potassium to 20 mill equivalents x 3 days then planned to have her resume her 10 mill equivalents.  I recommended lower extremity support stockings, continued low-salt diet, and continued fluid consumption.  We will plan follow-up in 1 to 2 weeks and plan a BMP in 1 week.  BMP 10/05/2023 showed stable renal function and potassium of 4.2.  She presents to the clinic today for follow-up evaluation and states she is feeling better.  We reviewed the  importance of lower extremity support stockings.  She continues to go down to the dining hall for lunch and dinner.  We reviewed her last clinic visit.  We reviewed her lab work.  She expressed understanding.  She is very grateful.  Will plan follow-up in 4 to 6 months.  I will continue her current medication regimen.  Today  she denies chest pain, shortness of breath, lower extremity edema, fatigue, palpitations, melena, hematuria, hemoptysis, diaphoresis, weakness, presyncope, syncope, orthopnea, and PND.    Home Medications    Prior to Admission medications   Medication Sig Start Date End Date Taking? Authorizing Provider  aspirin 81 MG tablet Take 81 mg by mouth daily.     [provider]  atorvastatin (LIPITOR) 40 MG tablet TAKE 1 TABLET BY MOUTH EVERY DAY 07/29/20   Runell Gess, MD  augmented betamethasone dipropionate (DIPROLENE-AF) 0.05 % cream Apply topically 2 (two) times daily.    [provider]  chloridazePOXIDE-amitriptyline (LIMBITROL) 5-12.5 MG per tablet Take 1 tablet by mouth daily.    [provider]  cholecalciferol (VITAMIN D) 1000 UNITS tablet Take 2,000 Units by mouth daily.    [provider]  estradiol (ESTRACE) 1 MG tablet Take 1 mg by mouth daily.    [provider]  fluocinonide (LIDEX) 0.05 % external solution Apply 1 application topically 2 (two) times daily.     [provider]  hydrochlorothiazide (MICROZIDE) 12.5 MG capsule Take 1 capsule (12.5 mg total) by mouth daily. 09/04/20 12/03/20  Runell Gess, MD  hydrocortisone 2.5 % cream APPLY TO RASH/ITCHY SKIN AT BEDTIME 11/18/20   Janalyn Harder, MD  Influenza vac split quadrivalent PF (FLUZONE HIGH-DOSE) 0.5 ML injection Fluzone High-Dose 2019-20 (PF) 180 mcg/0.5 mL intramuscular syringe  TO BE ADMINISTERED BY PHARMACIST FOR IMMUNIZATION    [provider]  isosorbide mononitrate (IMDUR) 30 MG 24 hr tablet TAKE 1 TABLET BY MOUTH EVERY DAY 03/12/20    Runell Gess, MD  levothyroxine (SYNTHROID, LEVOTHROID) 75 MCG tablet Take 75 mcg by mouth daily before breakfast.    [provider]  metoprolol succinate (TOPROL-XL) 100 MG 24 hr tablet TAKE 1 TABLET (100 MG TOTAL) BY MOUTH DAILY. TAKE WITH OR IMMEDIATELY FOLLOWING A MEAL. 10/28/20 01/26/21  Runell Gess, MD  Multiple Vitamin (MULTIVITAMIN) capsule Take 1 capsule by mouth daily.    [provider]  mupirocin ointment (BACTROBAN) 2 % Apply 1 application topically 2 (two) times daily. 10/17/20   Janalyn Harder, MD  niacin 500 MG tablet Take 500 mg by mouth daily with breakfast.    [provider]  olmesartan (BENICAR) 40 MG tablet Take 1 tablet (40 mg total) by mouth daily. 04/08/20   Runell Gess, MD  pantoprazole (PROTONIX) 40 MG tablet TAKE 1 TABLET BY MOUTH EVERY DAY Patient taking differently: Take by mouth as needed.  12/05/13   Hilty, Lisette Abu, MD  timolol (TIMOPTIC) 0.5 % ophthalmic solution Place 1 drop into the right eye every morning. 08/20/20   [provider]  TIMOLOL HEMIHYDRATE OP Place 1 drop into the right eye daily.    [provider]  VITAMIN E PO Take 400 mg by mouth daily. Plus D    [provider]    Family History    Family History  Problem Relation Age of Onset   Heart attack Mother    Heart attack Father    Cancer Sister    Diabetes Sister    Heart Problems Sister    She indicated that her mother is deceased. She indicated that her father is deceased. She indicated that her maternal grandmother is deceased. She indicated that her maternal grandfather is deceased. She indicated that her paternal grandmother is deceased. She indicated that her paternal grandfather is deceased.  Social History    Social History   Socioeconomic History  Marital status: Widowed    Spouse name: Not on file   Number of children: 4   Years of education: 51   Highest education level: Not on file  Occupational  History   Not on file  Tobacco Use   Smoking status: Never   Smokeless tobacco: Never  Substance and Sexual Activity   Alcohol use: Yes    Comment: "a drink of wine every now and then"   Drug use: Not on file   Sexual activity: Not on file  Other Topics Concern   Not on file  Social History Narrative   Not on file   Social Determinants of Health   Financial Resource Strain: Not on file  Food Insecurity: Not on file  Transportation Needs: Not on file  Physical Activity: Not on file  Stress: Not on file  Social Connections: Not on file  Intimate Partner Violence: Not on file     Review of Systems    General:  No chills, fever, night sweats or weight changes.  Cardiovascular:  No chest pain, dyspnea on exertion, edema, orthopnea, palpitations, paroxysmal nocturnal dyspnea. Dermatological: No rash, lesions/masses Respiratory: No cough, dyspnea Urologic: No hematuria, dysuria Abdominal:   No nausea, vomiting, diarrhea, bright red blood per rectum, melena, or hematemesis Neurologic:  No visual changes, wkns, changes in mental status. All other systems reviewed and are otherwise negative except as noted above.  Physical Exam    VS:  BP (!) 110/58 (BP Location: Left Arm, Patient Position: Sitting, Cuff Size: Normal)   Pulse (!) 55   Ht 5\' 5"  (1.651 m)   Wt 156 lb 12.8 oz (71.1 kg)   SpO2 96%   BMI 26.09 kg/m  , BMI Body mass index is 26.09 kg/m. GEN: Well nourished, well developed, in no acute distress. HEENT: normal. Neck: Supple, no JVD, carotid bruits, or masses. Cardiac: RRR, 2/6 systolic murmur heard along right sternal border.  , rubs, or gallops. No clubbing, cyanosis, generalized bilateral lower extremity nonpitting edema.  Radials/DP/PT 2+ and equal bilaterally.  Respiratory:  Respirations regular and unlabored, clear to auscultation bilaterally. GI: Soft, nontender, nondistended, BS + x 4. MS: no deformity or atrophy. Skin: warm and dry, no rash. Neuro:   Strength and sensation are intact. Psych: Normal affect.  Accessory Clinical Findings    Recent Labs: 07/07/2023: B Natriuretic Peptide 521.0 08/10/2023: ALT 14; Hemoglobin 10.1; Magnesium 1.7; Platelets 196 10/05/2023: BUN 14; Creat 0.87; Potassium 4.2; Sodium 138   Recent Lipid Panel    Component Value Date/Time   CHOL 119 07/09/2021 0038   TRIG 97 07/09/2021 0038   HDL 40 (L) 07/09/2021 0038   CHOLHDL 3.0 07/09/2021 0038   VLDL 19 07/09/2021 0038   LDLCALC 60 07/09/2021 0038    ECG personally reviewed by me today-none today.  Echocardiogram 03/04/2020 IMPRESSIONS     1. Mild aortic stenosis but moderate aortic regurgitation.   2. Left ventricular ejection fraction, by estimation, is 50 to 55%. The  left ventricle has low normal function. The left ventricle has no regional  wall motion abnormalities. Left ventricular diastolic parameters are  consistent with Grade II diastolic  dysfunction (pseudonormalization).   3. Right ventricular systolic function is normal. The right ventricular  size is normal. There is mildly elevated pulmonary artery systolic  pressure.   4. The mitral valve is normal in structure. Trivial mitral valve  regurgitation. No evidence of mitral stenosis.   5. The aortic valve is tricuspid. Aortic valve regurgitation is moderate.  Mild aortic valve stenosis.   6. The inferior vena cava is normal in size with <50% respiratory  variability, suggesting right atrial pressure of 8 mmHg.   Echocardiogram 07/09/2021 IMPRESSIONS     1. Left ventricular ejection fraction, by estimation, is 50 to 55%. The  left ventricle has low normal function. The left ventricle demonstrates  regional wall motion abnormalities with basal inferior and basal  inferolateral akinesis. There is mild left  ventricular hypertrophy. Left ventricular diastolic parameters are  consistent with Grade II diastolic dysfunction (pseudonormalization).   2. Right ventricular systolic  function is normal. The right ventricular  size is normal. Tricuspid regurgitation signal is inadequate for assessing  PA pressure.   3. The mitral valve is abnormal. Mild mitral valve regurgitation. No  evidence of mitral stenosis. Moderate mitral annular calcification.   4. The aortic valve is tricuspid. Aortic valve regurgitation is mild.  Moderate aortic valve stenosis (suspect paradoxical low flow/low gradient  moderate AS). Aortic valve area, by VTI measures 1.01 cm. Aortic valve  mean gradient measures 12.0 mmHg.   5. The IVC was not visualized.   Assessment & Plan   1.Diastolic CHF,Lower extremity edema-generalized bilateral lower extremity nonpitting edema.  Weight today 156 lbs. Continue potassium Heart healthy low-sodium diet-reviewed Continue lower extremity support stockings Daily weights-contact office with a weight increase of 2-3 pounds overnight or 5 pounds in 1 week  Coronary artery disease-denies episodes of chest pain at rest or with exertion.  Status post NSTEMI 07/08/2021.    Conservative management recommended.  Plavix started at that time.  Status post CABGx3 in 1997. Nuclear stress test 2013 showed no ischemia.  Continues to be somewhat physically active.   Continue Plavix, aspirin, metoprolol, Imdur, Lipitor Heart healthy low-sodium diet-reviewed  Essential hypertension-BP today 110/58  Continue blood pressure log Continue metoprolol, Imdur Heart healthy low-sodium diet-reviewed Continue current physical activity  Mild aortic stenosis/moderate aortic insufficiency-breathing at baseline. Echocardiogram 7/22 showed LVEF 50-55%, moderate AS with mild aortic regurgitation.  Hyperlipidemia-LDL 92 on 11/24/21  Continue atorvastatin Follows with PCP.   Disposition: Follow-up with [name] or me in 4-6 months.  Thomasene Ripple. Lachrisha Ziebarth NP-C    10/13/2023, 11:09 AM [facility] [address] Office [phone] Fax [phone]  Notice: This dictation was prepared with Dragon dictation along with smaller phrase technology. Any transcriptional errors that result from this process are unintentional and may not be corrected upon review.  I spent 14 minutes examining this patient, reviewing medications, and using patient centered shared decision making involving her cardiac care.  Prior to her visit I spent greater than 20 minutes reviewing her past medical history,  medications, and prior cardiac tests.

## 2023-10-13 ENCOUNTER — Ambulatory Visit: Payer: Medicare Other | Attending: General Practice | Admitting: General Practice

## 2023-10-13 ENCOUNTER — Encounter: Payer: Self-pay | Admitting: General Practice

## 2023-10-13 VITALS — BP 110/58 | HR 55 | Ht 65.0 in | Wt 156.8 lb

## 2023-10-13 DIAGNOSIS — E785 Hyperlipidemia, unspecified: Secondary | ICD-10-CM | POA: Diagnosis not present

## 2023-10-13 DIAGNOSIS — I10 Essential (primary) hypertension: Secondary | ICD-10-CM | POA: Diagnosis not present

## 2023-10-13 DIAGNOSIS — I350 Nonrheumatic aortic (valve) stenosis: Secondary | ICD-10-CM | POA: Diagnosis not present

## 2023-10-13 DIAGNOSIS — R600 Localized edema: Secondary | ICD-10-CM | POA: Diagnosis not present

## 2023-10-13 DIAGNOSIS — I2510 Atherosclerotic heart disease of native coronary artery without angina pectoris: Secondary | ICD-10-CM | POA: Diagnosis not present

## 2023-10-13 DIAGNOSIS — I5032 Chronic diastolic (congestive) heart failure: Secondary | ICD-10-CM | POA: Diagnosis not present

## 2023-10-13 NOTE — Patient Instructions (Signed)
Medication Instructions:  The current medical regimen is effective;  continue present plan and medications as directed. Please refer to the Current Medication list given to you today.  *If you need a refill on your cardiac medications before your next appointment, please call your pharmacy*  Lab Work: NONE  Other Instructions PLEASE WEAR YOUR STOCKINGS UP TO AT LEAST YOUR KNEES WITH NO WRINKLING-SEE ATTACHED. PLEASE FOLLOW LOW SALT DIET-SEE ATTACHED  Follow-Up: At [facility], you and your health needs are our priority.  As part of our continuing mission to provide you with exceptional heart care, we have created designated Provider Care Teams.  These Care Teams include your primary Cardiologist (physician) and Advanced Practice Providers (APPs -  Physician Assistants and Nurse Practitioners) who all work together to provide you with the care you need, when you need it.  We recommend signing up for the patient portal called "MyChart".  Sign up information is provided on this After Visit Summary.  MyChart is used to connect with patients for Virtual Visits (Telemedicine).  Patients are able to view lab/test results, encounter notes, upcoming appointments, etc.  Non-urgent messages can be sent to your provider as well.   To learn more about what you can do with MyChart, go to [email or web address].    Your next appointment:   4-6 month(s)  Provider:   Nanetta Batty, MD  or Edd Fabian, FNP           DASH Eating Plan DASH stands for Dietary Approaches to Stop Hypertension. The DASH eating plan is a healthy eating plan that has been shown to: Lower high blood pressure (hypertension). Reduce your risk for type 2 diabetes, heart disease, and stroke. Help with weight loss. What are tips for following this plan? Reading food labels Check food labels for the amount of salt (sodium) per serving. Choose foods with less than 5 percent of the Daily Value (DV) of sodium. In general,  foods with less than 300 milligrams (mg) of sodium per serving fit into this eating plan. To find whole grains, look for the word "whole" as the first word in the ingredient list. Shopping Buy products labeled as "low-sodium" or "no salt added." Buy fresh foods. Avoid canned foods and pre-made or frozen meals. Cooking Try not to add salt when you cook. Use salt-free seasonings or herbs instead of table salt or sea salt. Check with your health care provider or pharmacist before using salt substitutes. Do not fry foods. Cook foods in healthy ways, such as baking, boiling, grilling, roasting, or broiling. Cook using oils that are good for your heart. These include olive, canola, avocado, soybean, and sunflower oil. Meal planning  Eat a balanced diet. This should include: 4 or more servings of fruits and 4 or more servings of vegetables each day. Try to fill half of your plate with fruits and vegetables. 6-8 servings of whole grains each day. 6 or less servings of lean meat, poultry, or fish each day. 1 oz is 1 serving. A 3 oz (85 g) serving of meat is about the same size as the palm of your hand. One egg is 1 oz (28 g). 2-3 servings of low-fat dairy each day. One serving is 1 cup (237 mL). 1 serving of nuts, seeds, or beans 5 times each week. 2-3 servings of heart-healthy fats. Healthy fats called omega-3 fatty acids are found in foods such as walnuts, flaxseeds, fortified milks, and eggs. These fats are also found in cold-water fish, such as sardines,  salmon, and mackerel. Limit how much you eat of: Canned or prepackaged foods. Food that is high in trans fat, such as fried foods. Food that is high in saturated fat, such as fatty meat. Desserts and other sweets, sugary drinks, and other foods with added sugar. Full-fat dairy products. Do not salt foods before eating. Do not eat more than 4 egg yolks a week. Try to eat at least 2 vegetarian meals a week. Eat more home-cooked food and less  restaurant, buffet, and fast food. Lifestyle When eating at a restaurant, ask if your food can be made with less salt or no salt. If you drink alcohol: Limit how much you have to: 0-1 drink a day if you are female. 0-2 drinks a day if you are female. Know how much alcohol is in your drink. In the [place] one drink is one 12 oz bottle of beer (355 mL), one 5 oz glass of wine (148 mL), or one 1 oz glass of hard liquor (44 mL). General information Avoid eating more than 2,300 mg of salt a day. If you have hypertension, you may need to reduce your sodium intake to 1,500 mg a day. Work with your provider to stay at a healthy body weight or lose weight. Ask what the best weight range is for you. On most days of the week, get at least 30 minutes of exercise that causes your heart to beat faster. This may include walking, swimming, or biking. Work with your provider or dietitian to adjust your eating plan to meet your specific calorie needs. What foods should I eat? Fruits All fresh, dried, or frozen fruit. Canned fruits that are in their natural juice and do not have sugar added to them. Vegetables Fresh or frozen vegetables that are raw, steamed, roasted, or grilled. Low-sodium or reduced-sodium tomato and vegetable juice. Low-sodium or reduced-sodium tomato sauce and tomato paste. Low-sodium or reduced-sodium canned vegetables. Grains Whole-grain or whole-wheat bread. Whole-grain or whole-wheat pasta. Brown rice. Orpah Cobb. Bulgur. Whole-grain and low-sodium cereals. Pita bread. Low-fat, low-sodium crackers. Whole-wheat flour tortillas. Meats and other proteins Skinless chicken or [place]. Ground chicken or [place]. Pork with fat trimmed off. Fish and seafood. Egg whites. Dried beans, peas, or lentils. Unsalted nuts, nut butters, and seeds. Unsalted canned beans. Lean cuts of beef with fat trimmed off. Low-sodium, lean precooked or cured meat, such as sausages or meat loaves. Dairy Low-fat (1%) or  fat-free (skim) milk. Reduced-fat, low-fat, or fat-free cheeses. Nonfat, low-sodium ricotta or cottage cheese. Low-fat or nonfat yogurt. Low-fat, low-sodium cheese. Fats and oils Soft margarine without trans fats. Vegetable oil. Reduced-fat, low-fat, or light mayonnaise and salad dressings (reduced-sodium). Canola, safflower, olive, avocado, soybean, and sunflower oils. Avocado. Seasonings and condiments Herbs. Spices. Seasoning mixes without salt. Other foods Unsalted popcorn and pretzels. Fat-free sweets. The items listed above may not be all the foods and drinks you can have. Talk to a dietitian to learn more. What foods should I avoid? Fruits Canned fruit in a light or heavy syrup. Fried fruit. Fruit in cream or butter sauce. Vegetables Creamed or fried vegetables. Vegetables in a cheese sauce. Regular canned vegetables that are not marked as low-sodium or reduced-sodium. Regular canned tomato sauce and paste that are not marked as low-sodium or reduced-sodium. Regular tomato and vegetable juices that are not marked as low-sodium or reduced-sodium. Rosita Fire. Olives. Grains Baked goods made with fat, such as croissants, muffins, or some breads. Dry pasta or rice meal packs. Meats and other proteins Fatty  cuts of meat. Ribs. Fried meat. Tomasa Blase. Bologna, salami, and other precooked or cured meats, such as sausages or meat loaves, that are not lean and low in sodium. Fat from the back of a pig (fatback). Bratwurst. Salted nuts and seeds. Canned beans with added salt. Canned or smoked fish. Whole eggs or egg yolks. Chicken or [place] with skin. Dairy Whole or 2% milk, cream, and half-and-half. Whole or full-fat cream cheese. Whole-fat or sweetened yogurt. Full-fat cheese. Nondairy creamers. Whipped toppings. Processed cheese and cheese spreads. Fats and oils Butter. Stick margarine. Lard. Shortening. Ghee. Bacon fat. Tropical oils, such as coconut, palm kernel, or palm oil. Seasonings and  condiments Onion salt, garlic salt, seasoned salt, table salt, and sea salt. Worcestershire sauce. Tartar sauce. Barbecue sauce. Teriyaki sauce. Soy sauce, including reduced-sodium soy sauce. Steak sauce. Canned and packaged gravies. Fish sauce. Oyster sauce. Cocktail sauce. Store-bought horseradish. Ketchup. Mustard. Meat flavorings and tenderizers. Bouillon cubes. Hot sauces. Pre-made or packaged marinades. Pre-made or packaged taco seasonings. Relishes. Regular salad dressings. Other foods Salted popcorn and pretzels. The items listed above may not be all the foods and drinks you should avoid. Talk to a dietitian to learn more. Where to find more information National Heart, Lung, and Blood Institute (NHLBI): BuffaloDryCleaner.gl American Heart Association (AHA): heart.org Academy of Nutrition and Dietetics: eatright.org National Kidney Foundation (NKF): kidney.org This information is not intended to replace advice given to you by your health care provider. Make sure you discuss any questions you have with your health care provider. Document Revised: 12/31/2022 Document Reviewed: 12/31/2022 Elsevier Patient Education  2024 [employer].   Copywriter, [employer]  Youth worker for [employer] Mailing Address:  [address];   [address]  [place], [place] [address]  Tel [phone] Fx [phone]     High Quality Legwear for Today's Active Lifestyles Maximum Compression at the ankle. Compression lessens gradually up the leg.   We manufacture a wide range of compression hosiery for men and women in  different styles, constructions and levels of support.  How Compression Hosiery Works Regulatory affairs officer, [employer]. compression hosiery works by applying graduated pressure to the  muscles and veins in the legs.  When the calf muscle contracts such as during walking  the compression hosiery will "give" and then return to its original position. By doing so  the hosiery is  assists your body's circulatory wellness.  The result is increased leg health and vitality.   Maximum Compression at the ankle Compression lessens gradually up the leg  We Offer: Sheer & Opaque Stockings       COLORS:  Nude, black, white and misc. prints Below Knee Thigh High Pantyhose  High Quality Legwear for Today's Active Lifestyles We manufacture a wide range of compression hosiery for men and women in different styles, construction sand levels of support.  Socks:                     Sheer & Opaque   Compression Levels Include:                                  Stockings Men's               Below Knee                8-15 mmHg   Women's  Thigh High                 15-20 mmHg  Unisex             Pantyhose                 20-30 mmHg                                                                      30-40 mmHg  4 Simple Ways to Order   Email  [email or web address] Mail/Email orders are subject to processing and handling charges. Allow 7-10 days for receipt.  Phone [phone]  Please allow 24 hours for return call.   In Person  We recommend calling prior to your visit to confirm store hours as they may change due to holiday, weather, and maintenance.   By Mail When placing an order, please have the following information available. Our representatives are available to assist.     Measurements    THIGH      in.   CALF        in.   ANKLE     in.    Compression  8-15 mmHg >>15-20 mmHg**   20-30 mmHg 30-40 mmHg   WOMEN'S MEN'S  Shoe Size Sock Size Shoe Size Sock Size  4 - 5 Small 7.5 and Under Small  5.5 - 7.5 Medium 8 - 10 Medium  8 - 10 Large 10.5 - 12 Large  10.5 and Over X-Large 12.5 and Over X-Large   Knee High Size Chart  Length from CALF MEASUREMENT  floor to bend   in knee. 11" 12" 13" 14" 15" 16" 17" 18" 19" 20" 21" 22"  14" S S S S M M L L L XL XL XL  15" S S S M M L L L XL XL XXL XXL  16" S S M M M L L L XL XXL XXL XXL  17" S M M M M L L XL XL  Randa Lynn XXL  18" M M M M L L L XL XL XXL XXL XXL  19" M M M M L L XL XL XL XXL XXL XXL   Thigh High Circumference Sizing Chart                 S M L XL XXL  ANKLE 6.5" - 8" 8" - 9.5" 9.5" - 11" 11" - 12.5" 12.5" - 14"  CALF 10.5" - 14.5" 11.5" - 15.5" 12.5" - 17" 13.5" - 17.5" 14.5" - 19.5"  THIGH 15.5" - 22" 17.5" - 24" 19.5" - 26" 22" - 28" 26" - 32"  HIP UP TO 40" UP TO 44" UP TO 48' UP TO 52" UP TO 56"   Pantyhose Size Chart  Height Petite Medium Tall X-Tall Queen Queen +   Weight Weight Weight Weight Weight Weight  4'11" 95-130 135      5'0 95-125 130-145   170-185   5'1" 90-120 125-155 160-165  170-195   5'2" 90-115 120-145 150-165  170-195   5'3" 90-110 115-140 145-165  170-200 200-225  5'4" 100-105 110-135 140-160 165 170-200 200-225  5'5" 100 105-130 135-160 165 170-200 200-225  5'6"  110-125  130-155 160-165 170-200 200-225  5'7"  110-120 125-150 155-165 170-200 195-225  5'8"   120-145 150-165 170-200 190-225  5'9"   125-140 145-170 175-190 185-220  5'10"   125-135 140-185  185-215  5'11"   130-135 140-185  190-210   N

## 2023-10-15 DIAGNOSIS — R2689 Other abnormalities of gait and mobility: Secondary | ICD-10-CM | POA: Diagnosis not present

## 2023-10-15 DIAGNOSIS — M6281 Muscle weakness (generalized): Secondary | ICD-10-CM | POA: Diagnosis not present

## 2023-10-15 DIAGNOSIS — R2681 Unsteadiness on feet: Secondary | ICD-10-CM | POA: Diagnosis not present

## 2023-10-18 DIAGNOSIS — R2689 Other abnormalities of gait and mobility: Secondary | ICD-10-CM | POA: Diagnosis not present

## 2023-10-18 DIAGNOSIS — M6281 Muscle weakness (generalized): Secondary | ICD-10-CM | POA: Diagnosis not present

## 2023-10-18 DIAGNOSIS — R2681 Unsteadiness on feet: Secondary | ICD-10-CM | POA: Diagnosis not present

## 2023-10-20 DIAGNOSIS — R2689 Other abnormalities of gait and mobility: Secondary | ICD-10-CM | POA: Diagnosis not present

## 2023-10-20 DIAGNOSIS — M6281 Muscle weakness (generalized): Secondary | ICD-10-CM | POA: Diagnosis not present

## 2023-10-20 DIAGNOSIS — R2681 Unsteadiness on feet: Secondary | ICD-10-CM | POA: Diagnosis not present

## 2023-10-25 DIAGNOSIS — R2689 Other abnormalities of gait and mobility: Secondary | ICD-10-CM | POA: Diagnosis not present

## 2023-10-25 DIAGNOSIS — R2681 Unsteadiness on feet: Secondary | ICD-10-CM | POA: Diagnosis not present

## 2023-10-25 DIAGNOSIS — M6281 Muscle weakness (generalized): Secondary | ICD-10-CM | POA: Diagnosis not present

## 2023-10-27 DIAGNOSIS — M6281 Muscle weakness (generalized): Secondary | ICD-10-CM | POA: Diagnosis not present

## 2023-10-27 DIAGNOSIS — R2689 Other abnormalities of gait and mobility: Secondary | ICD-10-CM | POA: Diagnosis not present

## 2023-10-27 DIAGNOSIS — R2681 Unsteadiness on feet: Secondary | ICD-10-CM | POA: Diagnosis not present

## 2023-10-29 DIAGNOSIS — M6281 Muscle weakness (generalized): Secondary | ICD-10-CM | POA: Diagnosis not present

## 2023-10-29 DIAGNOSIS — R2689 Other abnormalities of gait and mobility: Secondary | ICD-10-CM | POA: Diagnosis not present

## 2023-10-29 DIAGNOSIS — R2681 Unsteadiness on feet: Secondary | ICD-10-CM | POA: Diagnosis not present

## 2023-11-01 DIAGNOSIS — M6281 Muscle weakness (generalized): Secondary | ICD-10-CM | POA: Diagnosis not present

## 2023-11-01 DIAGNOSIS — R2689 Other abnormalities of gait and mobility: Secondary | ICD-10-CM | POA: Diagnosis not present

## 2023-11-01 DIAGNOSIS — R2681 Unsteadiness on feet: Secondary | ICD-10-CM | POA: Diagnosis not present

## 2023-11-03 DIAGNOSIS — M6281 Muscle weakness (generalized): Secondary | ICD-10-CM | POA: Diagnosis not present

## 2023-11-03 DIAGNOSIS — R2689 Other abnormalities of gait and mobility: Secondary | ICD-10-CM | POA: Diagnosis not present

## 2023-11-03 DIAGNOSIS — R2681 Unsteadiness on feet: Secondary | ICD-10-CM | POA: Diagnosis not present

## 2023-11-05 DIAGNOSIS — R2681 Unsteadiness on feet: Secondary | ICD-10-CM | POA: Diagnosis not present

## 2023-11-05 DIAGNOSIS — R2689 Other abnormalities of gait and mobility: Secondary | ICD-10-CM | POA: Diagnosis not present

## 2023-11-05 DIAGNOSIS — M6281 Muscle weakness (generalized): Secondary | ICD-10-CM | POA: Diagnosis not present

## 2023-11-08 DIAGNOSIS — R2689 Other abnormalities of gait and mobility: Secondary | ICD-10-CM | POA: Diagnosis not present

## 2023-11-08 DIAGNOSIS — M6281 Muscle weakness (generalized): Secondary | ICD-10-CM | POA: Diagnosis not present

## 2023-11-08 DIAGNOSIS — R2681 Unsteadiness on feet: Secondary | ICD-10-CM | POA: Diagnosis not present

## 2023-11-10 DIAGNOSIS — R2681 Unsteadiness on feet: Secondary | ICD-10-CM | POA: Diagnosis not present

## 2023-11-10 DIAGNOSIS — R2689 Other abnormalities of gait and mobility: Secondary | ICD-10-CM | POA: Diagnosis not present

## 2023-11-10 DIAGNOSIS — M6281 Muscle weakness (generalized): Secondary | ICD-10-CM | POA: Diagnosis not present

## 2023-11-12 DIAGNOSIS — M6281 Muscle weakness (generalized): Secondary | ICD-10-CM | POA: Diagnosis not present

## 2023-11-12 DIAGNOSIS — R2689 Other abnormalities of gait and mobility: Secondary | ICD-10-CM | POA: Diagnosis not present

## 2023-11-12 DIAGNOSIS — R2681 Unsteadiness on feet: Secondary | ICD-10-CM | POA: Diagnosis not present

## 2023-11-15 DIAGNOSIS — R2689 Other abnormalities of gait and mobility: Secondary | ICD-10-CM | POA: Diagnosis not present

## 2023-11-15 DIAGNOSIS — M6281 Muscle weakness (generalized): Secondary | ICD-10-CM | POA: Diagnosis not present

## 2023-11-15 DIAGNOSIS — R2681 Unsteadiness on feet: Secondary | ICD-10-CM | POA: Diagnosis not present

## 2023-11-18 DIAGNOSIS — R2681 Unsteadiness on feet: Secondary | ICD-10-CM | POA: Diagnosis not present

## 2023-11-18 DIAGNOSIS — R2689 Other abnormalities of gait and mobility: Secondary | ICD-10-CM | POA: Diagnosis not present

## 2023-11-18 DIAGNOSIS — M6281 Muscle weakness (generalized): Secondary | ICD-10-CM | POA: Diagnosis not present

## 2023-11-19 DIAGNOSIS — R2681 Unsteadiness on feet: Secondary | ICD-10-CM | POA: Diagnosis not present

## 2023-11-19 DIAGNOSIS — R2689 Other abnormalities of gait and mobility: Secondary | ICD-10-CM | POA: Diagnosis not present

## 2023-11-19 DIAGNOSIS — M6281 Muscle weakness (generalized): Secondary | ICD-10-CM | POA: Diagnosis not present

## 2023-12-07 ENCOUNTER — Other Ambulatory Visit: Payer: Self-pay

## 2023-12-07 ENCOUNTER — Encounter (HOSPITAL_COMMUNITY): Payer: Self-pay

## 2023-12-07 ENCOUNTER — Emergency Department (HOSPITAL_COMMUNITY): Payer: Medicare Other

## 2023-12-07 ENCOUNTER — Emergency Department (HOSPITAL_COMMUNITY)
Admission: EM | Admit: 2023-12-07 | Discharge: 2023-12-07 | Disposition: A | Discharge status: Skilled Nursing Facility | Payer: Medicare Other | Attending: Emergency Medicine | Admitting: Emergency Medicine

## 2023-12-07 DIAGNOSIS — R9389 Abnormal findings on diagnostic imaging of other specified body structures: Secondary | ICD-10-CM | POA: Diagnosis not present

## 2023-12-07 DIAGNOSIS — S0990XA Unspecified injury of head, initial encounter: Secondary | ICD-10-CM | POA: Diagnosis not present

## 2023-12-07 DIAGNOSIS — W010XXA Fall on same level from slipping, tripping and stumbling without subsequent striking against object, initial encounter: Secondary | ICD-10-CM | POA: Diagnosis not present

## 2023-12-07 DIAGNOSIS — S199XXA Unspecified injury of neck, initial encounter: Secondary | ICD-10-CM | POA: Diagnosis not present

## 2023-12-07 DIAGNOSIS — Z7401 Bed confinement status: Secondary | ICD-10-CM | POA: Diagnosis not present

## 2023-12-07 DIAGNOSIS — Z951 Presence of aortocoronary bypass graft: Secondary | ICD-10-CM | POA: Insufficient documentation

## 2023-12-07 DIAGNOSIS — S0003XA Contusion of scalp, initial encounter: Secondary | ICD-10-CM | POA: Insufficient documentation

## 2023-12-07 DIAGNOSIS — Z7982 Long term (current) use of aspirin: Secondary | ICD-10-CM | POA: Diagnosis not present

## 2023-12-07 DIAGNOSIS — R9089 Other abnormal findings on diagnostic imaging of central nervous system: Secondary | ICD-10-CM | POA: Diagnosis not present

## 2023-12-07 DIAGNOSIS — R918 Other nonspecific abnormal finding of lung field: Secondary | ICD-10-CM | POA: Diagnosis not present

## 2023-12-07 DIAGNOSIS — M160 Bilateral primary osteoarthritis of hip: Secondary | ICD-10-CM | POA: Diagnosis not present

## 2023-12-07 DIAGNOSIS — Z043 Encounter for examination and observation following other accident: Secondary | ICD-10-CM | POA: Diagnosis not present

## 2023-12-07 DIAGNOSIS — R531 Weakness: Secondary | ICD-10-CM | POA: Diagnosis not present

## 2023-12-07 DIAGNOSIS — Z79899 Other long term (current) drug therapy: Secondary | ICD-10-CM | POA: Diagnosis not present

## 2023-12-07 DIAGNOSIS — R0989 Other specified symptoms and signs involving the circulatory and respiratory systems: Secondary | ICD-10-CM | POA: Diagnosis not present

## 2023-12-07 DIAGNOSIS — I10 Essential (primary) hypertension: Secondary | ICD-10-CM | POA: Diagnosis not present

## 2023-12-07 DIAGNOSIS — M542 Cervicalgia: Secondary | ICD-10-CM | POA: Diagnosis not present

## 2023-12-07 DIAGNOSIS — R9431 Abnormal electrocardiogram [ECG] [EKG]: Secondary | ICD-10-CM | POA: Diagnosis not present

## 2023-12-07 DIAGNOSIS — W19XXXA Unspecified fall, initial encounter: Secondary | ICD-10-CM | POA: Diagnosis not present

## 2023-12-07 NOTE — Progress Notes (Signed)
   12/07/23 0810  Spiritual Encounters  Type of Visit Initial  Care provided to: Pt not available  Referral source Trauma page  Reason for visit Trauma  OnCall Visit No   Chaplain responded to a level two trauma. The patient was attended to by the medical team.  No family is present. If a chaplain is requested someone will respond.  Valerie Roys [facility]  [phone]

## 2023-12-07 NOTE — ED Triage Notes (Signed)
PT presents to ED by [facility] from [facility], cc of fall on thinners. Pt stable, laceration to posterior head, bleeding controlled. Pt was not in c-collar on arrival

## 2023-12-07 NOTE — ED Notes (Signed)
EDPA Prosperi at bedside

## 2023-12-07 NOTE — ED Notes (Signed)
Ptar called eta hour an a half

## 2023-12-07 NOTE — ED Provider Notes (Signed)
[place] EMERGENCY DEPARTMENT AT [facility] Provider Note   CSN: [id number] Arrival date & time: 12/07/23  4098     History  Chief Complaint  Patient presents with   Fall    On thinners    Kathryn Carlson is a 89 y.o. female with past medical history significant for hypertension, hyperlipidemia, status post CABG x 3 who is taking Plavix who presents with concern for mechanical fall at home.  Patient reports that she was leaned over after making her bed and lost her balance falling onto the right side of the head.  Patient denies any loss of consciousness, numbness, tingling, weakness after the fall.  HPI     Home Medications Prior to Admission medications   Medication Sig Start Date End Date Taking? Authorizing Provider  acetaminophen (TYLENOL) 500 MG tablet Take 500 mg by mouth at bedtime as needed (pain).   Yes [provider]  amLODipine (NORVASC) 5 MG tablet Take 5 mg by mouth daily.   Yes [provider]  Aspirin 81 MG CAPS Take 81 mg by mouth daily.   Yes [provider]  atorvastatin (LIPITOR) 40 MG tablet TAKE 1 TABLET BY MOUTH EVERY DAY 09/21/23  Yes Runell Gess, MD  cholecalciferol (VITAMIN D3) 25 MCG (1000 UNIT) tablet Take 1,000 Units by mouth daily.   Yes [provider]  clopidogrel (PLAVIX) 75 MG tablet TAKE 1 TABLET BY MOUTH EVERY DAY 06/24/23  Yes Runell Gess, MD  estradiol (ESTRACE) 0.1 MG/GM vaginal cream Place 1 Applicatorful vaginally See admin instructions. Apply 1g vaginally in the evening on Monday, Wednesday, Friday.   Yes [provider]  hydrocortisone 2.5 % cream Apply 1 Application topically at bedtime.   Yes [provider]  isosorbide mononitrate (IMDUR) 60 MG 24 hr tablet TAKE 1 TABLET BY MOUTH EVERY DAY 06/28/23  Yes Runell Gess, MD  levothyroxine (SYNTHROID) 75 MCG tablet Take 75 mcg by mouth daily before breakfast.   Yes [provider]  metoprolol  succinate (TOPROL-XL) 100 MG 24 hr tablet TAKE 1 TABLET BY MOUTH EVERY DAY WITH OR IMMEDIATELY FOLLOWING A MEAL 09/21/23  Yes Runell Gess, MD  Multiple Vitamin (MULTIVITAMIN WITH MINERALS) TABS tablet Take 1 tablet by mouth daily.   Yes [provider]  mupirocin ointment (BACTROBAN) 2 % Apply 1 Application topically daily as needed (irritation).   Yes [provider]  nitroGLYCERIN (NITROSTAT) 0.4 MG SL tablet Place 0.4 mg under the tongue every 5 (five) minutes as needed for chest pain. 08/12/23  Yes [provider]  potassium chloride (KLOR-CON) 10 MEQ tablet Take 1 tablet (10 mEq total) by mouth daily for 14 days. 20 meq X3 days then take Patient taking differently: Take 10 mEq by mouth daily. 09/27/23 02/05/24 Yes Cleaver, Thomasene Ripple, NP      Allergies    Mobic [meloxicam] and Novocain [procaine]    Review of Systems   Review of Systems  All other systems reviewed and are negative.   Physical Exam Updated Vital Signs BP (!) 155/59   Pulse 78   Temp 98.6 F (37 C) (Oral)   Resp 20   SpO2 92%  Physical Exam Vitals and nursing note reviewed.  Constitutional:      General: She is not in acute distress.    Appearance: Normal appearance.  HENT:     Head: Normocephalic and atraumatic.  Eyes:     General:  Right eye: No discharge.        Left eye: No discharge.  Cardiovascular:     Rate and Rhythm: Normal rate and regular rhythm.     Heart sounds: No murmur heard.    No friction rub. No gallop.  Pulmonary:     Effort: Pulmonary effort is normal.     Breath sounds: Normal breath sounds.  Abdominal:     General: Bowel sounds are normal.     Palpations: Abdomen is soft.  Musculoskeletal:     Comments: Intact strength 5/5 of upper and lower extremities  Skin:    General: Skin is warm and dry.     Capillary Refill: Capillary refill takes less than 2 seconds.     Comments: Small hematoma without open laceration on right lateral scalp   Neurological:     Mental Status: She is alert and oriented to person, place, and time.     Comments: Moves all 4 limbs spontaneously, CN II through XII grossly intact, can ambulate without difficulty, intact sensation throughout.  Psychiatric:        Mood and Affect: Mood normal.        Behavior: Behavior normal.     ED Results / Procedures / Treatments   Labs (all labs ordered are listed, but only abnormal results are displayed) Labs Reviewed - No data to display  EKG None  Radiology CT Head Wo Contrast  Result Date: 12/07/2023 CLINICAL DATA:  Head trauma, minor (Age >= 65y) Repeat CT at 4 hours to further assess poorly visualized artifact versus bleed EXAM: CT HEAD WITHOUT CONTRAST TECHNIQUE: Contiguous axial images were obtained from the base of the skull through the vertex without intravenous contrast. RADIATION DOSE REDUCTION: This exam was performed according to the departmental dose-optimization program which includes automated exposure control, adjustment of the mA and/or kV according to patient size and/or use of iterative reconstruction technique. COMPARISON:  CT head 12/07/23 FINDINGS: Brain: There is likely a persistent small extra-axial collection along the left frontal convexity which predominantly appears low-density. This is unchanged in size compared to recent prior head CT and most likely represents either a subdural hygroma for a chronic subdural hematoma. There is no midline shift. No mass lesion. No CT evidence of acute cortical infarct. No hydrocephalus Vascular: No hyperdense vessel or unexpected calcification. Skull: Normal. Negative for fracture or focal lesion. Sinuses/Orbits: No middle ear or mastoid effusion. Paranasal sinuses are clear. Orbits are unremarkable. Other: None. IMPRESSION: Persistent small extra-axial collection along the left frontal convexity which predominantly appears low-density. This is unchanged in size compared to recent prior head CT and most  likely represents either a subdural hygroma or a chronic subdural hematoma. No midline shift. Electronically Signed   By: Lorenza Cambridge M.D.   On: 12/07/2023 14:11   DG Pelvis Portable  Result Date: 12/07/2023 CLINICAL DATA:  Status post fall EXAM: PORTABLE PELVIS 1 VIEWS COMPARISON:  None Available. FINDINGS: There is no evidence of pelvic fracture or diastasis. No pelvic bone lesions are seen. Degenerative changes of the bilateral hips. IMPRESSION: No radiographic finding of acute displaced fracture. Electronically Signed   By: Agustin Cree M.D.   On: 12/07/2023 09:25   CT Head Wo Contrast  Result Date: 12/07/2023 CLINICAL DATA:  Head trauma, minor (Age >= 65y); Neck trauma (Age >= 65y). Fall on blood thinners. EXAM: CT HEAD WITHOUT CONTRAST CT CERVICAL SPINE WITHOUT CONTRAST TECHNIQUE: Multidetector CT imaging of the head and cervical spine was performed following the standard  protocol without intravenous contrast. Multiplanar CT image reconstructions of the cervical spine were also generated. RADIATION DOSE REDUCTION: This exam was performed according to the departmental dose-optimization program which includes automated exposure control, adjustment of the mA and/or kV according to patient size and/or use of iterative reconstruction technique. COMPARISON:  Head CT 02/29/2004. FINDINGS: CT HEAD FINDINGS Brain: Hazy density underlying the calvarium along the left-greater-than-right bilateral cerebral convexities is favored to reflect cupping artifact given lack of vessel displacement. Cortical gray-white differentiation is preserved. Asymmetric volume loss of the left cerebral hemisphere with frontotemporal predominance. No acute hydrocephalus or midline shift. Vascular: No hyperdense vessel or unexpected calcification. Skull: No calvarial fracture or suspicious bone lesion. Skull base is unremarkable. Sinuses/Orbits: No acute finding. Other: Small right parietal scalp hematoma. CT CERVICAL SPINE FINDINGS  Alignment: 3 mm degenerative, stair step anterolisthesis from C5-T1. Skull base and vertebrae: No acute fracture. Severe degenerative changes of the craniocervical junction with moderate degenerative pannus resulting in at least mild spinal canal stenosis at C1-2. No suspicious bone lesions. Soft tissues and spinal canal: No prevertebral fluid or swelling. No visible canal hematoma. Disc levels: Multilevel cervical spondylosis with at least moderate spinal canal stenosis at C5-6. Upper chest: No acute findings. Other: Atherosclerotic calcifications of the carotid bulbs. IMPRESSION: 1. Hazy density underlying the calvarium along the left-greater-than-right bilateral cerebral convexities is favored to reflect cupping artifact given lack of vessel displacement. Consider follow-up head CT in 4-6 hours versus noncontrast brain MRI to exclude subdural hemorrhage. 2. Small right parietal scalp hematoma. 3. No acute cervical spine fracture. 4. Multilevel cervical spondylosis with at least moderate spinal canal stenosis at C5-6. These results were called by telephone at the time of interpretation on 12/07/2023 at 8:51 am to provider [facility] , who verbally acknowledged these results. Electronically Signed   By: Orvan Falconer M.D.   On: 12/07/2023 08:57   CT Cervical Spine Wo Contrast  Result Date: 12/07/2023 CLINICAL DATA:  Head trauma, minor (Age >= 65y); Neck trauma (Age >= 65y). Fall on blood thinners. EXAM: CT HEAD WITHOUT CONTRAST CT CERVICAL SPINE WITHOUT CONTRAST TECHNIQUE: Multidetector CT imaging of the head and cervical spine was performed following the standard protocol without intravenous contrast. Multiplanar CT image reconstructions of the cervical spine were also generated. RADIATION DOSE REDUCTION: This exam was performed according to the departmental dose-optimization program which includes automated exposure control, adjustment of the mA and/or kV according to patient size and/or use of  iterative reconstruction technique. COMPARISON:  Head CT 02/29/2004. FINDINGS: CT HEAD FINDINGS Brain: Hazy density underlying the calvarium along the left-greater-than-right bilateral cerebral convexities is favored to reflect cupping artifact given lack of vessel displacement. Cortical gray-white differentiation is preserved. Asymmetric volume loss of the left cerebral hemisphere with frontotemporal predominance. No acute hydrocephalus or midline shift. Vascular: No hyperdense vessel or unexpected calcification. Skull: No calvarial fracture or suspicious bone lesion. Skull base is unremarkable. Sinuses/Orbits: No acute finding. Other: Small right parietal scalp hematoma. CT CERVICAL SPINE FINDINGS Alignment: 3 mm degenerative, stair step anterolisthesis from C5-T1. Skull base and vertebrae: No acute fracture. Severe degenerative changes of the craniocervical junction with moderate degenerative pannus resulting in at least mild spinal canal stenosis at C1-2. No suspicious bone lesions. Soft tissues and spinal canal: No prevertebral fluid or swelling. No visible canal hematoma. Disc levels: Multilevel cervical spondylosis with at least moderate spinal canal stenosis at C5-6. Upper chest: No acute findings. Other: Atherosclerotic calcifications of the carotid bulbs. IMPRESSION: 1. Hazy density underlying the  calvarium along the left-greater-than-right bilateral cerebral convexities is favored to reflect cupping artifact given lack of vessel displacement. Consider follow-up head CT in 4-6 hours versus noncontrast brain MRI to exclude subdural hemorrhage. 2. Small right parietal scalp hematoma. 3. No acute cervical spine fracture. 4. Multilevel cervical spondylosis with at least moderate spinal canal stenosis at C5-6. These results were called by telephone at the time of interpretation on 12/07/2023 at 8:51 am to provider [facility] , who verbally acknowledged these results. Electronically Signed   By: Orvan Falconer M.D.   On: 12/07/2023 08:57   DG Chest Portable 1 View  Result Date: 12/07/2023 CLINICAL DATA:  Status post fall EXAM: PORTABLE CHEST 1 VIEW COMPARISON:  Chest radiograph dated 08/10/2023 FINDINGS: Asymmetric elevation of the right hemidiaphragm with asymmetrically lower right lung volumes. Bibasilar patchy and linear opacities. Questionable trace bilateral pleural effusions. No pneumothorax. Similar postsurgical cardiomediastinal silhouette. Median sternotomy wires are nondisplaced. Unchanged fracture of the inferior most wire. Degenerative changes of the right shoulder. No radiographic finding of acute displaced fracture. IMPRESSION: 1. Bibasilar patchy and linear opacities, likely atelectasis. 2.  No radiographic finding of acute displaced fracture. 3. Questionable trace bilateral pleural effusions. Electronically Signed   By: Agustin Cree M.D.   On: 12/07/2023 08:52    Procedures Procedures    Medications Ordered in ED Medications - No data to display  ED Course/ Medical Decision Making/ A&P                                 Medical Decision Making  This patient is a 89 y.o. female  who presents to the ED for concern of fall, head injury.   Differential diagnoses prior to evaluation: The emergent differential diagnosis includes, but is not limited to,  epidural hematoma, subdural hematoma, skull fracture, subarachnoid hemorrhage, unstable cervical spine fracture, concussion vs other MSK injury  . This is not an exhaustive differential.   Past Medical History / Co-morbidities / Social History:  hypertension, hyperlipidemia, status post CABG x 3  Physical Exam: Physical exam performed. The pertinent findings include: Moves all 4 limbs spontaneously, CN II through XII grossly intact, can ambulate without difficulty, intact sensation throughout.  Intact strength 5/5 of upper and lower extremities   Lab Tests/Imaging studies: I personally interpreted labs/imaging and the pertinent  results include:  Independent interpreted plain film radiograph of the pelvis, CT of the head, CT C-spine and plain film chest x-ray which showed no acute fracture, dislocation, head CT however was suspicious for a possible artifact versus intracranial abnormality --repeat head CT shows that the artifact is unlikely to be an acute bleed, appears to be either a hygroma or subacute subdural hematoma and overall think that patient is stable for discharge with close follow-up.   Cardiac monitoring: EKG obtained and interpreted by myself and attending physician which shows: Normal sinus rhythm, no acute ST-T changes   Disposition: After consideration of the diagnostic results and the patients response to treatment, I feel that ultimately patient with a small hematoma of the scalp and no other significant injuries after her fall, the artifact versus bleed noted on her initial head CT appears benign on repeat with either hygroma or chronic subdural hematoma, patient request discharge and I think this is reasonable  emergency department workup does not suggest an emergent condition requiring admission or immediate intervention beyond what has been performed at this time. The plan is: as  above. The patient is safe for discharge and has been instructed to return immediately for worsening symptoms, change in symptoms or any other concerns.  Final Clinical Impression(s) / ED Diagnoses Final diagnoses:  Fall, initial encounter  Injury of head, initial encounter  Hematoma of scalp, initial encounter    Rx / DC Orders ED Discharge Orders     None         [place] 12/07/23 1433    Gerhard Munch, MD 12/07/23 1530

## 2023-12-07 NOTE — ED Notes (Signed)
ED PA at bedside

## 2023-12-07 NOTE — ED Notes (Signed)
Left message for Kathryn Carlson at [facility]... trying to give report

## 2023-12-07 NOTE — Discharge Instructions (Addendum)
Please use Tylenol for pain.  You may use 1000 mg of Tylenol every 6 hours.  Not to exceed 4 g of Tylenol within 24 hours.  Please follow-up closely with your primary care doctor to ensure that you are continuing to feel improved after your head injury.  There is no evidence of an acute bleed today, they think that you may have had a bleed at some point previously versus something called a hygroma which is a benign connection of cerebrospinal fluid.  Please follow-up with the neurosurgeon his contact information I have provided above for close recheck.

## 2023-12-07 NOTE — ED Notes (Signed)
C-collar removed per PA Prosperi

## 2023-12-07 NOTE — ED Notes (Signed)
Pt provided food and drink.

## 2023-12-07 NOTE — ED Notes (Signed)
C-collar applied

## 2023-12-07 NOTE — ED Notes (Signed)
Patient transported to CT 

## 2023-12-07 NOTE — ED Notes (Signed)
This paramedic transported pt to and from CT

## 2023-12-08 DIAGNOSIS — N1831 Chronic kidney disease, stage 3a: Secondary | ICD-10-CM | POA: Diagnosis not present

## 2023-12-08 DIAGNOSIS — I129 Hypertensive chronic kidney disease with stage 1 through stage 4 chronic kidney disease, or unspecified chronic kidney disease: Secondary | ICD-10-CM | POA: Diagnosis not present

## 2023-12-08 DIAGNOSIS — D692 Other nonthrombocytopenic purpura: Secondary | ICD-10-CM | POA: Diagnosis not present

## 2023-12-08 DIAGNOSIS — R269 Unspecified abnormalities of gait and mobility: Secondary | ICD-10-CM | POA: Diagnosis not present

## 2023-12-08 DIAGNOSIS — I252 Old myocardial infarction: Secondary | ICD-10-CM | POA: Diagnosis not present

## 2023-12-08 DIAGNOSIS — I25810 Atherosclerosis of coronary artery bypass graft(s) without angina pectoris: Secondary | ICD-10-CM | POA: Diagnosis not present

## 2023-12-08 DIAGNOSIS — E785 Hyperlipidemia, unspecified: Secondary | ICD-10-CM | POA: Diagnosis not present

## 2023-12-08 DIAGNOSIS — I872 Venous insufficiency (chronic) (peripheral): Secondary | ICD-10-CM | POA: Diagnosis not present

## 2023-12-08 DIAGNOSIS — R7301 Impaired fasting glucose: Secondary | ICD-10-CM | POA: Diagnosis not present

## 2023-12-08 DIAGNOSIS — K219 Gastro-esophageal reflux disease without esophagitis: Secondary | ICD-10-CM | POA: Diagnosis not present

## 2024-01-05 ENCOUNTER — Encounter: Payer: Self-pay | Admitting: Adult Health

## 2024-01-05 ENCOUNTER — Non-Acute Institutional Stay: Payer: Medicare Other | Admitting: Adult Health

## 2024-01-05 DIAGNOSIS — Z7689 Persons encountering health services in other specified circumstances: Secondary | ICD-10-CM

## 2024-01-05 DIAGNOSIS — E876 Hypokalemia: Secondary | ICD-10-CM | POA: Diagnosis not present

## 2024-01-05 DIAGNOSIS — Z79890 Hormone replacement therapy (postmenopausal): Secondary | ICD-10-CM | POA: Diagnosis not present

## 2024-01-05 DIAGNOSIS — I2510 Atherosclerotic heart disease of native coronary artery without angina pectoris: Secondary | ICD-10-CM

## 2024-01-05 DIAGNOSIS — R7303 Prediabetes: Secondary | ICD-10-CM | POA: Diagnosis not present

## 2024-01-05 DIAGNOSIS — E039 Hypothyroidism, unspecified: Secondary | ICD-10-CM | POA: Diagnosis not present

## 2024-01-05 DIAGNOSIS — I10 Essential (primary) hypertension: Secondary | ICD-10-CM

## 2024-01-05 DIAGNOSIS — E785 Hyperlipidemia, unspecified: Secondary | ICD-10-CM | POA: Diagnosis not present

## 2024-01-05 NOTE — Progress Notes (Signed)
 Provider:  Jereld Delude, NP Location:   [name] Room Number: 818-A Place of Service:  ALF ([phone])  PCP: Shepard Ade, MD Patient Care Team: Shepard Ade, MD as PCP - General (Internal Medicine) Court Dorn PARAS, MD as PCP - Cardiology (Cardiology) Livingston Rigg, MD (Inactive) as Consulting Physician (Dermatology)  Extended Emergency Contact Information Primary Emergency Contact: [name],[name] Address: [address] [place]           [address] [place] Home Phone: [phone] Mobile Phone: [phone] Relation: Daughter Secondary Emergency Contact: McCain,Debbie  [place] Home Phone: [phone] Work Phone: [phone] Relation: Daughter  Code Status: FULL CODE Goals of Care: Advanced Directive information    01/05/2024   11:46 AM  Advanced Directives  Does Patient Have a Medical Advance Directive? No  Would patient like information on creating a medical advance directive? No - Patient declined      Chief Complaint  Patient presents with   Establish Care    New patient.     HPI: Patient is a 89 y.o. female seen today to establish care with [facility]. She has a PMH of hypertension, hyperlipidemia, s/p CABG x 5. She is resident of [name]. She walks with a walker. She stated that she had 2 heart attacks  and the first one was at the age of 44. She worked as a engineer, agricultural.     Past Medical History:  Diagnosis Date   BCC (basal cell carcinoma of skin) 01/09/2009   Lower Central Back (tx p bx)   CAD (coronary artery disease)    possible ant wall MI ('97) - cath & CABG x5   Exogenous obesity    GERD (gastroesophageal reflux disease)    History of nuclear stress test 09/2012   lexiscan ; mild perfusion defect in apical anterior & apical region (infarct/scar) - no significant ischemia, low risk    History of total bilateral knee replacement    Hypertension     Hypothyroidism    Nodular basal cell carcinoma (BCC) 08/17/2017   Left Calf (tx p bx)   PVC's (premature ventricular contractions)    SCC (squamous cell carcinoma) [place] 01/29/2004   Right Forearm (Cx3,5FU)   SCCA (squamous cell carcinoma) of skin 03/13/2005   Mid Upper Back   SCCA (squamous cell carcinoma) of skin 09/07/2005   Left Elbow(Cx3,5FU)   SCCA (squamous cell carcinoma) of skin 01/25/2006   Left Brow(in situ) (Cx3,5FU)   SCCA (squamous cell carcinoma) of skin 04/27/2006   Inner Left Shoulder(Keratoacanthoma) (Exc.)   SCCA (squamous cell carcinoma) of skin 03/22/2007   Left Temple(in situ) and Bridge of Nose(in situ) (Cs3,5FU)   SCCA (squamous cell carcinoma) of skin 05/12/2007   Top of Scalp(Cx3,5FU)   SCCA (squamous cell carcinoma) of skin 01/28/2011   Right Upper Arm(in situ)   SCCA (squamous cell carcinoma) of skin 07/26/2012   Glabella, Inferior Tip (Cx3,5FU)   SCCA (squamous cell carcinoma) of skin 03/08/2013   Left Front Scalp(in situ) and Upper Nose(in situ) (Cx3,5FU)   SCCA (squamous cell carcinoma) of skin 07/24/2013   Right Lower Leg(Keratoacanthoma)   SCCA (squamous cell carcinoma) of skin 09/11/2015   Left Scalp(in situ) (Cx3,5FU)   SCCA (squamous cell carcinoma) of skin 01/15/2016   Left Forearm(in situ) and Left Temple(in situ) (tx p bx)   SCCA (squamous cell carcinoma) of skin 10/01/2016   Left Mid Forearm(in situ)(tx p bx) and Left Front Scalp(watch)   SCCA (squamous  cell carcinoma) of skin 11/12/2016   Left Temple(Keratoacanthoma) (watch)   SCCA (squamous cell carcinoma) of skin 02/11/2017   Top Left Hand(Keratoacanthoma) (tx p bx)   Squamous cell carcinoma in situ (SCCIS) 11/18/1999   Above Left Outer Eyebrow   Squamous cell carcinoma of scalp    removed - Dr. Livingston   Superficial basal cell carcinoma (BCC) 07/14/2004   Left Scapula (Cx3,5FU)   Past Surgical History:  Procedure Laterality Date   ABDOMINAL HYSTERECTOMY  1984   APPENDECTOMY   1941   Carotid Doppler  12/07/2012   R & L ICA - 0-49% diameter reduction   CORONARY ARTERY BYPASS GRAFT  09/1996   cath & CABG x5 LIMA-LAD, SVG-sequential OD & OM, SVG sequential to PDA & PLA (Dr. Fleeta Ochoa)   REPLACEMENT TOTAL KNEE Bilateral 2001 & 2003   TONSILLECTOMY     TRANSTHORACIC ECHOCARDIOGRAM  08/23/2013   EF 50-55%, LV cavity size mod reduced, mild LVH, mild conc hypertrophy; mild AV stenosis & mild regurg; mild MR - ordered for murmur    reports that she has never smoked. She has never used smokeless tobacco. She reports current alcohol  use. No history on file for drug use. Social History   Socioeconomic History   Marital status: Widowed    Spouse name: Not on file   Number of children: 4   Years of education: 56   Highest education level: Not on file  Occupational History   Not on file  Tobacco Use   Smoking status: Never   Smokeless tobacco: Never  Substance and Sexual Activity   Alcohol  use: Yes    Comment: a drink of wine every now and then   Drug use: Not on file   Sexual activity: Not on file  Other Topics Concern   Not on file  Social History Narrative   Not on file   Social Drivers of Health   Financial Resource Strain: Not on file  Food Insecurity: Not on file  Transportation Needs: Not on file  Physical Activity: Not on file  Stress: Not on file  Social Connections: Not on file  Intimate Partner Violence: Not on file    Functional Status Survey:    Family History  Problem Relation Age of Onset   Heart attack Mother    Heart attack Father    Cancer Sister    Diabetes Sister    Heart Problems Sister     Health Maintenance  Topic Date Due   COVID-19 Vaccine (1) Never done   Pneumonia Vaccine 58+ Years old (1 of 2 - PCV) Never done   DTaP/Tdap/Td (1 - Tdap) Never done   Zoster Vaccines- Shingrix (1 of 2) Never done   Medicare Annual Wellness (AWV)  06/02/2023   INFLUENZA VACCINE  07/29/2023   DEXA SCAN  Completed   HPV VACCINES   Aged Out    Allergies  Allergen Reactions   Mobic [Meloxicam] Other (See Comments)    Unknown reaction   Novocain [Procaine] Other (See Comments)    Unknown reaction    Allergies as of 01/05/2024       Reactions   Mobic [meloxicam] Other (See Comments)   Unknown reaction   Novocain [procaine] Other (See Comments)   Unknown reaction        Medication List        Accurate as of January 05, 2024 11:46 AM. If you have any questions, ask your nurse or doctor.  acetaminophen  500 MG tablet Commonly known as: TYLENOL  Take 500 mg by mouth at bedtime as needed (pain).   acetaminophen  325 MG tablet Commonly known as: TYLENOL  Take 650 mg by mouth every 4 (four) hours as needed.   amLODipine 5 MG tablet Commonly known as: NORVASC Take 5 mg by mouth daily.   Aspirin  81 MG Caps Take 81 mg by mouth daily.   atorvastatin  40 MG tablet Commonly known as: LIPITOR TAKE 1 TABLET BY MOUTH EVERY DAY   cholecalciferol  25 MCG (1000 UNIT) tablet Commonly known as: VITAMIN D3 Take 1,000 Units by mouth daily.   clopidogrel  75 MG tablet Commonly known as: PLAVIX  TAKE 1 TABLET BY MOUTH EVERY DAY   estradiol  0.1 MG/GM vaginal cream Commonly known as: ESTRACE  Place 1 Applicatorful vaginally See admin instructions. Apply 1g vaginally in the evening on Monday, Wednesday, Friday.   hydrocortisone 2.5 % cream Apply 1 Application topically at bedtime.   isosorbide  mononitrate 60 MG 24 hr tablet Commonly known as: IMDUR  TAKE 1 TABLET BY MOUTH EVERY DAY   levothyroxine  75 MCG tablet Commonly known as: SYNTHROID  Take 75 mcg by mouth daily before breakfast.   metoprolol  succinate 100 MG 24 hr tablet Commonly known as: TOPROL -XL TAKE 1 TABLET BY MOUTH EVERY DAY WITH OR IMMEDIATELY FOLLOWING A MEAL   multivitamin with minerals Tabs tablet Take 1 tablet by mouth daily.   mupirocin  ointment 2 % Commonly known as: BACTROBAN  Apply 1 Application topically daily as needed  (irritation).   nitroGLYCERIN  0.4 MG SL tablet Commonly known as: NITROSTAT  Place 0.4 mg under the tongue every 5 (five) minutes as needed for chest pain.   potassium chloride  10 MEQ tablet Commonly known as: KLOR-CON  Take 10 mEq by mouth daily as needed. What changed: Another medication with the same name was removed. Continue taking this medication, and follow the directions you see here. Changed by: Sigurd Pugh Medina-Vargas        Review of Systems  Constitutional:  Negative for appetite change, chills, fatigue and fever.  HENT:  Positive for hearing loss. Negative for congestion, rhinorrhea and sore throat.        Has bilateral hearing loss, wears hearing aids  Eyes: Negative.   Respiratory:  Negative for cough, shortness of breath and wheezing.   Cardiovascular:  Negative for chest pain, palpitations and leg swelling.  Gastrointestinal:  Negative for abdominal pain, constipation, diarrhea, nausea and vomiting.  Genitourinary:  Negative for dysuria.  Musculoskeletal:  Negative for arthralgias, back pain and myalgias.  Skin:  Negative for color change, rash and wound.  Neurological:  Negative for dizziness, weakness and headaches.  Psychiatric/Behavioral:  Negative for behavioral problems. The patient is not nervous/anxious.     Vitals:   01/05/24 1142  BP: (!) 165/70  Pulse: 68  Resp: 16  Temp: (!) 97.3 F (36.3 C)  SpO2: 99%  Weight: 155 lb 12.8 oz (70.7 kg)  Height: 5' 5 (1.651 m)   Body mass index is 25.93 kg/m. Physical Exam Constitutional:      Appearance: Normal appearance.  HENT:     Head: Normocephalic and atraumatic.     Nose: Nose normal.     Mouth/Throat:     Mouth: Mucous membranes are moist.  Eyes:     Conjunctiva/sclera: Conjunctivae normal.  Cardiovascular:     Rate and Rhythm: Normal rate and regular rhythm.     Heart sounds: Murmur heard.  Pulmonary:     Effort: Pulmonary effort is normal.  Breath sounds: Normal breath sounds.   Abdominal:     General: Bowel sounds are normal.     Palpations: Abdomen is soft.  Musculoskeletal:        General: Normal range of motion.     Cervical back: Normal range of motion.     Right lower leg: Edema present.     Left lower leg: Edema present.     Comments: BLE 1-2+edema  Skin:    General: Skin is warm and dry.  Neurological:     General: No focal deficit present.     Mental Status: She is alert and oriented to person, place, and time.  Psychiatric:        Mood and Affect: Mood normal.        Behavior: Behavior normal.        Thought Content: Thought content normal.        Judgment: Judgment normal.     Labs reviewed: Basic Metabolic Panel: Recent Labs    07/07/23 2302 08/10/23 1548 08/11/23 0927 10/05/23 0600  NA 137 132* 133* 138  K 4.0 2.8* 3.5 4.2  CL 100 101 103 104  CO2 24 23 20* 28  GLUCOSE 121* 140* 115* 90  BUN 15 8 7* 14  CREATININE 0.93 0.82 0.79 0.87  CALCIUM  8.7* 8.1* 8.0* 8.6  MG 1.8 1.7  --   --    Liver Function Tests: Recent Labs    08/10/23 1548  AST 23  ALT 14  ALKPHOS 108  BILITOT 0.9  PROT 6.7  ALBUMIN 2.8*   No results for input(s): LIPASE, AMYLASE in the last 8760 hours. No results for input(s): AMMONIA in the last 8760 hours. CBC: Recent Labs    07/07/23 2302 08/10/23 1548  WBC 7.7 6.0  NEUTROABS 4.2 4.0  HGB 10.4* 10.1*  HCT 32.7* 31.1*  MCV 104.8* 100.0  PLT 203 196   Cardiac Enzymes: No results for input(s): CKTOTAL, CKMB, CKMBINDEX, TROPONINI in the last 8760 hours. BNP: Invalid input(s): POCBNP Lab Results  Component Value Date   HGBA1C 6.3 (H) 07/08/2021   Lab Results  Component Value Date   TSH 2.414 07/08/2021   Lab Results  Component Value Date   VITAMINB12 320 09/13/2013   No results found for: FOLATE No results found for: IRON, TIBC, FERRITIN  Imaging and Procedures obtained prior to SNF admission: CT Head Wo Contrast Result Date: 12/07/2023 CLINICAL DATA:   Head trauma, minor (Age >= 65y) Repeat CT at 4 hours to further assess poorly visualized artifact versus bleed EXAM: CT HEAD WITHOUT CONTRAST TECHNIQUE: Contiguous axial images were obtained from the base of the skull through the vertex without intravenous contrast. RADIATION DOSE REDUCTION: This exam was performed according to the departmental dose-optimization program which includes automated exposure control, adjustment of the mA and/or kV according to patient size and/or use of iterative reconstruction technique. COMPARISON:  CT head 12/07/23 FINDINGS: Brain: There is likely a persistent small extra-axial collection along the left frontal convexity which predominantly appears low-density. This is unchanged in size compared to recent prior head CT and most likely represents either a subdural hygroma for a chronic subdural hematoma. There is no midline shift. No mass lesion. No CT evidence of acute cortical infarct. No hydrocephalus Vascular: No hyperdense vessel or unexpected calcification. Skull: Normal. Negative for fracture or focal lesion. Sinuses/Orbits: No middle ear or mastoid effusion. Paranasal sinuses are clear. Orbits are unremarkable. Other: None. IMPRESSION: Persistent small extra-axial collection along the left frontal convexity  which predominantly appears low-density. This is unchanged in size compared to recent prior head CT and most likely represents either a subdural hygroma or a chronic subdural hematoma. No midline shift. Electronically Signed   By: Lyndall Gore M.D.   On: 12/07/2023 14:11   DG Pelvis Portable Result Date: 12/07/2023 CLINICAL DATA:  Status post fall EXAM: PORTABLE PELVIS 1 VIEWS COMPARISON:  None Available. FINDINGS: There is no evidence of pelvic fracture or diastasis. No pelvic bone lesions are seen. Degenerative changes of the bilateral hips. IMPRESSION: No radiographic finding of acute displaced fracture. Electronically Signed   By: Limin  Xu M.D.   On: 12/07/2023  09:25   CT Head Wo Contrast Result Date: 12/07/2023 CLINICAL DATA:  Head trauma, minor (Age >= 65y); Neck trauma (Age >= 65y). Fall on blood thinners. EXAM: CT HEAD WITHOUT CONTRAST CT CERVICAL SPINE WITHOUT CONTRAST TECHNIQUE: Multidetector CT imaging of the head and cervical spine was performed following the standard protocol without intravenous contrast. Multiplanar CT image reconstructions of the cervical spine were also generated. RADIATION DOSE REDUCTION: This exam was performed according to the departmental dose-optimization program which includes automated exposure control, adjustment of the mA and/or kV according to patient size and/or use of iterative reconstruction technique. COMPARISON:  Head CT 02/29/2004. FINDINGS: CT HEAD FINDINGS Brain: Hazy density underlying the calvarium along the left-greater-than-right bilateral cerebral convexities is favored to reflect cupping artifact given lack of vessel displacement. Cortical gray-white differentiation is preserved. Asymmetric volume loss of the left cerebral hemisphere with frontotemporal predominance. No acute hydrocephalus or midline shift. Vascular: No hyperdense vessel or unexpected calcification. Skull: No calvarial fracture or suspicious bone lesion. Skull base is unremarkable. Sinuses/Orbits: No acute finding. Other: Small right parietal scalp hematoma. CT CERVICAL SPINE FINDINGS Alignment: 3 mm degenerative, stair step anterolisthesis from C5-T1. Skull base and vertebrae: No acute fracture. Severe degenerative changes of the craniocervical junction with moderate degenerative pannus resulting in at least mild spinal canal stenosis at C1-2. No suspicious bone lesions. Soft tissues and spinal canal: No prevertebral fluid or swelling. No visible canal hematoma. Disc levels: Multilevel cervical spondylosis with at least moderate spinal canal stenosis at C5-6. Upper chest: No acute findings. Other: Atherosclerotic calcifications of the carotid bulbs.  IMPRESSION: 1. Hazy density underlying the calvarium along the left-greater-than-right bilateral cerebral convexities is favored to reflect cupping artifact given lack of vessel displacement. Consider follow-up head CT in 4-6 hours versus noncontrast brain MRI to exclude subdural hemorrhage. 2. Small right parietal scalp hematoma. 3. No acute cervical spine fracture. 4. Multilevel cervical spondylosis with at least moderate spinal canal stenosis at C5-6. These results were called by telephone at the time of interpretation on 12/07/2023 at 8:51 am to provider [facility] , who verbally acknowledged these results. Electronically Signed   By: Ryan Chess M.D.   On: 12/07/2023 08:57   CT Cervical Spine Wo Contrast Result Date: 12/07/2023 CLINICAL DATA:  Head trauma, minor (Age >= 65y); Neck trauma (Age >= 65y). Fall on blood thinners. EXAM: CT HEAD WITHOUT CONTRAST CT CERVICAL SPINE WITHOUT CONTRAST TECHNIQUE: Multidetector CT imaging of the head and cervical spine was performed following the standard protocol without intravenous contrast. Multiplanar CT image reconstructions of the cervical spine were also generated. RADIATION DOSE REDUCTION: This exam was performed according to the departmental dose-optimization program which includes automated exposure control, adjustment of the mA and/or kV according to patient size and/or use of iterative reconstruction technique. COMPARISON:  Head CT 02/29/2004. FINDINGS: CT HEAD FINDINGS Brain: Hazy  density underlying the calvarium along the left-greater-than-right bilateral cerebral convexities is favored to reflect cupping artifact given lack of vessel displacement. Cortical gray-white differentiation is preserved. Asymmetric volume loss of the left cerebral hemisphere with frontotemporal predominance. No acute hydrocephalus or midline shift. Vascular: No hyperdense vessel or unexpected calcification. Skull: No calvarial fracture or suspicious bone lesion. Skull  base is unremarkable. Sinuses/Orbits: No acute finding. Other: Small right parietal scalp hematoma. CT CERVICAL SPINE FINDINGS Alignment: 3 mm degenerative, stair step anterolisthesis from C5-T1. Skull base and vertebrae: No acute fracture. Severe degenerative changes of the craniocervical junction with moderate degenerative pannus resulting in at least mild spinal canal stenosis at C1-2. No suspicious bone lesions. Soft tissues and spinal canal: No prevertebral fluid or swelling. No visible canal hematoma. Disc levels: Multilevel cervical spondylosis with at least moderate spinal canal stenosis at C5-6. Upper chest: No acute findings. Other: Atherosclerotic calcifications of the carotid bulbs. IMPRESSION: 1. Hazy density underlying the calvarium along the left-greater-than-right bilateral cerebral convexities is favored to reflect cupping artifact given lack of vessel displacement. Consider follow-up head CT in 4-6 hours versus noncontrast brain MRI to exclude subdural hemorrhage. 2. Small right parietal scalp hematoma. 3. No acute cervical spine fracture. 4. Multilevel cervical spondylosis with at least moderate spinal canal stenosis at C5-6. These results were called by telephone at the time of interpretation on 12/07/2023 at 8:51 am to provider [facility] , who verbally acknowledged these results. Electronically Signed   By: Ryan Chess M.D.   On: 12/07/2023 08:57   DG Chest Portable 1 View Result Date: 12/07/2023 CLINICAL DATA:  Status post fall EXAM: PORTABLE CHEST 1 VIEW COMPARISON:  Chest radiograph dated 08/10/2023 FINDINGS: Asymmetric elevation of the right hemidiaphragm with asymmetrically lower right lung volumes. Bibasilar patchy and linear opacities. Questionable trace bilateral pleural effusions. No pneumothorax. Similar postsurgical cardiomediastinal silhouette. Median sternotomy wires are nondisplaced. Unchanged fracture of the inferior most wire. Degenerative changes of the right  shoulder. No radiographic finding of acute displaced fracture. IMPRESSION: 1. Bibasilar patchy and linear opacities, likely atelectasis. 2.  No radiographic finding of acute displaced fracture. 3. Questionable trace bilateral pleural effusions. Electronically Signed   By: Limin  Xu M.D.   On: 12/07/2023 08:52    Assessment/Plan  1. Encounter to establish care (Primary) -  establish care with [facility]  2. Primary hypertension -  BP -   Continue amlodipine and metoprolol  succinate  3. Coronary artery disease involving native coronary artery of native heart, unspecified whether angina present -    Denies chest pain, stable -    Continue isosorbide  MN, Atorvastatin , Plavix  and NTG PRN   4. Hormone replacement therapy (HRT) -   Continue Estrace  vaginal cream  5. Acquired hypothyroidism Lab Results  Component Value Date   TSH 2.414 07/08/2021    -   Continue Synthroid   6. Hypokalemia Lab Results  Component Value Date   K 4.2 10/05/2023    -    Continue K+ supplementation  7. Dyslipidemia Lab Results  Component Value Date   CHOL 119 07/09/2021   HDL 40 (L) 07/09/2021   LDLCALC 60 07/09/2021   TRIG 97 07/09/2021   CHOLHDL 3.0 07/09/2021   -   Continue atorvastatin   8. Prediabetes  Lab Results  Component Value Date   HGBA1C 6.3 (H) 07/08/2021   -  diet-controlled    Family/ staff Communication:  Discussed plan of care with resident and charge nurse.  Labs/tests ordered: Lipid panel, TSH, CMP, CBC and A1c

## 2024-01-06 DIAGNOSIS — I10 Essential (primary) hypertension: Secondary | ICD-10-CM | POA: Diagnosis not present

## 2024-01-06 DIAGNOSIS — R600 Localized edema: Secondary | ICD-10-CM | POA: Diagnosis not present

## 2024-01-06 DIAGNOSIS — E119 Type 2 diabetes mellitus without complications: Secondary | ICD-10-CM | POA: Diagnosis not present

## 2024-01-06 DIAGNOSIS — E785 Hyperlipidemia, unspecified: Secondary | ICD-10-CM | POA: Diagnosis not present

## 2024-01-06 LAB — LIPID PANEL
Cholesterol: 151 (ref 0–200)
HDL: 48 (ref 35–70)
LDL Cholesterol: 84
Triglycerides: 101 (ref 40–160)

## 2024-01-06 LAB — COMPREHENSIVE METABOLIC PANEL
Albumin: 3.3 — AB (ref 3.5–5.0)
Calcium: 9.1 (ref 8.7–10.7)
Globulin: 3.7

## 2024-01-06 LAB — BASIC METABOLIC PANEL
BUN: 24 — AB (ref 4–21)
CO2: 24 — AB (ref 13–22)
Chloride: 106 (ref 99–108)
Creatinine: 0.9 (ref 0.5–1.1)
Glucose: 88
Potassium: 4.6 meq/L (ref 3.5–5.1)
Sodium: 141 (ref 137–147)

## 2024-01-06 LAB — CBC AND DIFFERENTIAL
HCT: 28 — AB (ref 36–46)
Hemoglobin: 9.2 — AB (ref 12.0–16.0)
Platelets: 227 10*3/uL (ref 150–400)
WBC: 7.3

## 2024-01-06 LAB — HEPATIC FUNCTION PANEL
ALT: 53 U/L — AB (ref 7–35)
AST: 55 — AB (ref 13–35)
Alkaline Phosphatase: 274 — AB (ref 25–125)
Bilirubin, Total: 0.3

## 2024-01-06 LAB — HEMOGLOBIN A1C: Hemoglobin A1C: 6.5

## 2024-01-06 LAB — CBC: RBC: 2.96 — AB (ref 3.87–5.11)

## 2024-01-06 LAB — TSH: TSH: 2.78 (ref 0.41–5.90)

## 2024-01-10 ENCOUNTER — Encounter: Payer: Self-pay | Admitting: Sports Medicine

## 2024-01-10 ENCOUNTER — Non-Acute Institutional Stay: Payer: Medicare Other | Admitting: Sports Medicine

## 2024-01-10 DIAGNOSIS — I10 Essential (primary) hypertension: Secondary | ICD-10-CM

## 2024-01-10 DIAGNOSIS — E039 Hypothyroidism, unspecified: Secondary | ICD-10-CM

## 2024-01-10 DIAGNOSIS — Z951 Presence of aortocoronary bypass graft: Secondary | ICD-10-CM

## 2024-01-10 DIAGNOSIS — D649 Anemia, unspecified: Secondary | ICD-10-CM | POA: Diagnosis not present

## 2024-01-10 DIAGNOSIS — R600 Localized edema: Secondary | ICD-10-CM | POA: Diagnosis not present

## 2024-01-10 DIAGNOSIS — R7989 Other specified abnormal findings of blood chemistry: Secondary | ICD-10-CM | POA: Diagnosis not present

## 2024-01-10 NOTE — Progress Notes (Signed)
 Provider:  Jackalyn Blazing, MD Location:   [name]   Place of Service:   Assisted living    PCP: Shepard Ade, MD Patient Care Team: Shepard Ade, MD as PCP - General (Internal Medicine) Court Dorn PARAS, MD as PCP - Cardiology (Cardiology) Livingston Rigg, MD (Inactive) as Consulting Physician (Dermatology)  Extended Emergency Contact Information Primary Emergency Contact: [name],[name] Address: [address] [place] [address] [place] Home Phone: [phone] Mobile Phone: [phone] Relation: Daughter Secondary Emergency Contact: McCain,Debbie  [place] Home Phone: [phone] Work Phone: [phone] Relation: Daughter  Code Status:  Goals of Care: Advanced Directive information    01/05/2024   11:46 AM  Advanced Directives  Does Patient Have a Medical Advance Directive? No  Would patient like information on creating a medical advance directive? No - Patient declined      No chief complaint on file. Discussed the use of AI scribe software for clinical note transcription with the patient, who gave verbal consent to proceed.  HPI: Patient is a 89 y.o. female seen today for establish care with [facility]  Pt is a resident in Assisted living and previously followed by Dr Shepard and wants to switch to [facility].    She reports feeling relatively well for her age and medical history, with no current chest pain or shortness of breath. She continues to follow up with a cardiologist every six months.  Her primary complaint is a recent onset of back pain, localized to one area, which has been present for about a week. She has been managing the pain with a heating pad and nightly Tylenol . She denies any recent trauma or falls, and the pain does not radiate down her legs. She notes that she only sleeps on her right side, which she believes may be contributing to the discomfort.  Kathryn Carlson also reports a lack  of energy, which she tries to combat with regular walking. She eats breakfast in her room and usually has one meal in the dining room. She has been eating more recently and has noticed an improvement in her bowel movements, now occurring daily. She denies any abdominal pain, nausea, or reflux.  Despite her cardiac history, she denies any current chest pain, palpitations, or shortness of breath, even when walking to the dining room. She does note that she tires easily. She sleeps with her head slightly elevated, more out of habit than necessity, and reports variable sleep quality. She denies any current feelings of worry or depression.         Latest Ref Rng & Units 08/10/2023    3:48 PM 07/07/2023   11:02 PM 07/21/2021   10:07 AM  CBC  WBC 4.0 - 10.5 K/uL 6.0  7.7  10.7   Hemoglobin 12.0 - 15.0 g/dL 89.8  89.5  89.5   Hematocrit 36.0 - 46.0 % 31.1  32.7  32.2   Platelets 150 - 400 K/uL 196  203  261              Latest Ref Rng & Units 10/05/2023    6:00 AM 08/11/2023    9:27 AM 08/10/2023    3:48 PM  BMP  Glucose 65 - 99 mg/dL 90  884  859   BUN 7 - 25 mg/dL 14  7  8    Creatinine 0.60 - 0.95 mg/dL 9.12  9.20  9.17   BUN/Creat Ratio 6 - 22 (calc)  SEE NOTE:     Sodium 135 - 146 mmol/L 138  133  132   Potassium 3.5 - 5.3 mmol/L 4.2  3.5  2.8   Chloride 98 - 110 mmol/L 104  103  101   CO2 20 - 32 mmol/L 28  20  23    Calcium  8.6 - 10.4 mg/dL 8.6  8.0  8.1      Hb 9.2 Lab Results  Component Value Date   HGBA1C 6.3 (H) 07/08/2021     Past Medical History:  Diagnosis Date   BCC (basal cell carcinoma of skin) 01/09/2009   Lower Central Back (tx p bx)   CAD (coronary artery disease)    possible ant wall MI ('97) - cath & CABG x5   Exogenous obesity    GERD (gastroesophageal reflux disease)    History of nuclear stress test 09/2012   lexiscan ; mild perfusion defect in apical anterior & apical region (infarct/scar) - no significant ischemia, low risk    History of total bilateral  knee replacement    Hypertension    Hypothyroidism    Nodular basal cell carcinoma (BCC) 08/17/2017   Left Calf (tx p bx)   PVC's (premature ventricular contractions)    SCC (squamous cell carcinoma) [place] 01/29/2004   Right Forearm (Cx3,5FU)   SCCA (squamous cell carcinoma) of skin 03/13/2005   Mid Upper Back   SCCA (squamous cell carcinoma) of skin 09/07/2005   Left Elbow(Cx3,5FU)   SCCA (squamous cell carcinoma) of skin 01/25/2006   Left Brow(in situ) (Cx3,5FU)   SCCA (squamous cell carcinoma) of skin 04/27/2006   Inner Left Shoulder(Keratoacanthoma) (Exc.)   SCCA (squamous cell carcinoma) of skin 03/22/2007   Left Temple(in situ) and Bridge of Nose(in situ) (Cs3,5FU)   SCCA (squamous cell carcinoma) of skin 05/12/2007   Top of Scalp(Cx3,5FU)   SCCA (squamous cell carcinoma) of skin 01/28/2011   Right Upper Arm(in situ)   SCCA (squamous cell carcinoma) of skin 07/26/2012   Glabella, Inferior Tip (Cx3,5FU)   SCCA (squamous cell carcinoma) of skin 03/08/2013   Left Front Scalp(in situ) and Upper Nose(in situ) (Cx3,5FU)   SCCA (squamous cell carcinoma) of skin 07/24/2013   Right Lower Leg(Keratoacanthoma)   SCCA (squamous cell carcinoma) of skin 09/11/2015   Left Scalp(in situ) (Cx3,5FU)   SCCA (squamous cell carcinoma) of skin 01/15/2016   Left Forearm(in situ) and Left Temple(in situ) (tx p bx)   SCCA (squamous cell carcinoma) of skin 10/01/2016   Left Mid Forearm(in situ)(tx p bx) and Left Front Scalp(watch)   SCCA (squamous cell carcinoma) of skin 11/12/2016   Left Temple(Keratoacanthoma) (watch)   SCCA (squamous cell carcinoma) of skin 02/11/2017   Top Left Hand(Keratoacanthoma) (tx p bx)   Squamous cell carcinoma in situ (SCCIS) 11/18/1999   Above Left Outer Eyebrow   Squamous cell carcinoma of scalp    removed - Dr. Livingston   Superficial basal cell carcinoma (BCC) 07/14/2004   Left Scapula (Cx3,5FU)   Past Surgical History:  Procedure Laterality Date   ABDOMINAL  HYSTERECTOMY  1984   APPENDECTOMY  1941   Carotid Doppler  12/07/2012   R & L ICA - 0-49% diameter reduction   CORONARY ARTERY BYPASS GRAFT  09/1996   cath & CABG x5 LIMA-LAD, SVG-sequential OD & OM, SVG sequential to PDA & PLA (Dr. Fleeta Ochoa)   REPLACEMENT TOTAL KNEE Bilateral 2001 & 2003   TONSILLECTOMY     TRANSTHORACIC ECHOCARDIOGRAM  08/23/2013   EF 50-55%, LV cavity size mod  reduced, mild LVH, mild conc hypertrophy; mild AV stenosis & mild regurg; mild MR - ordered for murmur    reports that she has never smoked. She has never used smokeless tobacco. She reports current alcohol  use. No history on file for drug use. Social History   Socioeconomic History   Marital status: Widowed    Spouse name: Not on file   Number of children: 4   Years of education: 58   Highest education level: Not on file  Occupational History   Not on file  Tobacco Use   Smoking status: Never   Smokeless tobacco: Never  Substance and Sexual Activity   Alcohol  use: Yes    Comment: a drink of wine every now and then   Drug use: Not on file   Sexual activity: Not on file  Other Topics Concern   Not on file  Social History Narrative   Not on file   Social Drivers of Health   Financial Resource Strain: Not on file  Food Insecurity: Not on file  Transportation Needs: Not on file  Physical Activity: Not on file  Stress: Not on file  Social Connections: Not on file  Intimate Partner Violence: Not on file    Functional Status Survey:    Family History  Problem Relation Age of Onset   Heart attack Mother    Heart attack Father    Cancer Sister    Diabetes Sister    Heart Problems Sister     Health Maintenance  Topic Date Due   COVID-19 Vaccine (1) Never done   Pneumonia Vaccine 4+ Years old (1 of 2 - PCV) Never done   DTaP/Tdap/Td (1 - Tdap) Never done   Zoster Vaccines- Shingrix (1 of 2) Never done   Medicare Annual Wellness (AWV)  06/02/2023   INFLUENZA VACCINE  07/29/2023   DEXA  SCAN  Completed   HPV VACCINES  Aged Out    Allergies  Allergen Reactions   Mobic [Meloxicam] Other (See Comments)    Unknown reaction   Novocain [Procaine] Other (See Comments)    Unknown reaction    Outpatient Encounter Medications as of 01/10/2024  Medication Sig   acetaminophen  (TYLENOL ) 325 MG tablet Take 650 mg by mouth every 4 (four) hours as needed.   acetaminophen  (TYLENOL ) 500 MG tablet Take 500 mg by mouth at bedtime as needed (pain).   amLODipine (NORVASC) 5 MG tablet Take 5 mg by mouth daily.   Aspirin  81 MG CAPS Take 81 mg by mouth daily.   atorvastatin  (LIPITOR) 40 MG tablet TAKE 1 TABLET BY MOUTH EVERY DAY   cholecalciferol  (VITAMIN D3) 25 MCG (1000 UNIT) tablet Take 1,000 Units by mouth daily.   clopidogrel  (PLAVIX ) 75 MG tablet TAKE 1 TABLET BY MOUTH EVERY DAY   estradiol  (ESTRACE ) 0.1 MG/GM vaginal cream Place 1 Applicatorful vaginally See admin instructions. Apply 1g vaginally in the evening on Monday, Wednesday, Friday.   hydrocortisone 2.5 % cream Apply 1 Application topically at bedtime.   isosorbide  mononitrate (IMDUR ) 60 MG 24 hr tablet TAKE 1 TABLET BY MOUTH EVERY DAY   levothyroxine  (SYNTHROID ) 75 MCG tablet Take 75 mcg by mouth daily before breakfast.   metoprolol  succinate (TOPROL -XL) 100 MG 24 hr tablet TAKE 1 TABLET BY MOUTH EVERY DAY WITH OR IMMEDIATELY FOLLOWING A MEAL   Multiple Vitamin (MULTIVITAMIN WITH MINERALS) TABS tablet Take 1 tablet by mouth daily.   mupirocin  ointment (BACTROBAN ) 2 % Apply 1 Application topically daily as needed (irritation).  nitroGLYCERIN  (NITROSTAT ) 0.4 MG SL tablet Place 0.4 mg under the tongue every 5 (five) minutes as needed for chest pain.   potassium chloride  (KLOR-CON ) 10 MEQ tablet Take 10 mEq by mouth daily as needed.   Facility-Administered Encounter Medications as of 01/10/2024  Medication   triamcinolone  acetonide (KENALOG -40) injection 40 mg    Review of Systems  Constitutional:  Positive for fatigue.  Negative for fever.  HENT:  Negative for sinus pressure and sore throat.   Respiratory:  Negative for cough, shortness of breath and wheezing.   Cardiovascular:  Positive for leg swelling. Negative for chest pain and palpitations.  Gastrointestinal:  Negative for abdominal distention, abdominal pain, blood in stool, constipation, diarrhea, nausea and vomiting.  Genitourinary:  Negative for dysuria, frequency and urgency.  Neurological:  Negative for dizziness.    There were no vitals filed for this visit. There is no height or weight on file to calculate BMI. Physical Exam Constitutional:      Appearance: Normal appearance.  Cardiovascular:     Rate and Rhythm: Normal rate and regular rhythm.     Heart sounds: Murmur heard.  Pulmonary:     Effort: Pulmonary effort is normal. No respiratory distress.     Breath sounds: Normal breath sounds. No wheezing.  Abdominal:     General: Bowel sounds are normal. There is no distension.     Tenderness: There is no abdominal tenderness. There is no guarding or rebound.     Comments:    Musculoskeletal:        General: Swelling present.     Comments: Nonpitting oedema  Neurological:     Mental Status: She is alert. Mental status is at baseline.     Labs reviewed: Basic Metabolic Panel: Recent Labs    07/07/23 2302 08/10/23 1548 08/11/23 0927 10/05/23 0600  NA 137 132* 133* 138  K 4.0 2.8* 3.5 4.2  CL 100 101 103 104  CO2 24 23 20* 28  GLUCOSE 121* 140* 115* 90  BUN 15 8 7* 14  CREATININE 0.93 0.82 0.79 0.87  CALCIUM  8.7* 8.1* 8.0* 8.6  MG 1.8 1.7  --   --    Liver Function Tests: Recent Labs    08/10/23 1548  AST 23  ALT 14  ALKPHOS 108  BILITOT 0.9  PROT 6.7  ALBUMIN 2.8*   No results for input(s): LIPASE, AMYLASE in the last 8760 hours. No results for input(s): AMMONIA in the last 8760 hours. CBC: Recent Labs    07/07/23 2302 08/10/23 1548  WBC 7.7 6.0  NEUTROABS 4.2 4.0  HGB 10.4* 10.1*  HCT 32.7*  31.1*  MCV 104.8* 100.0  PLT 203 196   Cardiac Enzymes: No results for input(s): CKTOTAL, CKMB, CKMBINDEX, TROPONINI in the last 8760 hours. BNP: Invalid input(s): POCBNP Lab Results  Component Value Date   HGBA1C 6.3 (H) 07/08/2021   Lab Results  Component Value Date   TSH 2.414 07/08/2021   Lab Results  Component Value Date   VITAMINB12 320 09/13/2013   No results found for: FOLATE No results found for: IRON, TIBC, FERRITIN  Imaging and Procedures obtained prior to SNF admission: CT Head Wo Contrast Result Date: 12/07/2023 CLINICAL DATA:  Head trauma, minor (Age >= 65y) Repeat CT at 4 hours to further assess poorly visualized artifact versus bleed EXAM: CT HEAD WITHOUT CONTRAST TECHNIQUE: Contiguous axial images were obtained from the base of the skull through the vertex without intravenous contrast. RADIATION DOSE REDUCTION: This exam was  performed according to the departmental dose-optimization program which includes automated exposure control, adjustment of the mA and/or kV according to patient size and/or use of iterative reconstruction technique. COMPARISON:  CT head 12/07/23 FINDINGS: Brain: There is likely a persistent small extra-axial collection along the left frontal convexity which predominantly appears low-density. This is unchanged in size compared to recent prior head CT and most likely represents either a subdural hygroma for a chronic subdural hematoma. There is no midline shift. No mass lesion. No CT evidence of acute cortical infarct. No hydrocephalus Vascular: No hyperdense vessel or unexpected calcification. Skull: Normal. Negative for fracture or focal lesion. Sinuses/Orbits: No middle ear or mastoid effusion. Paranasal sinuses are clear. Orbits are unremarkable. Other: None. IMPRESSION: Persistent small extra-axial collection along the left frontal convexity which predominantly appears low-density. This is unchanged in size compared to recent prior  head CT and most likely represents either a subdural hygroma or a chronic subdural hematoma. No midline shift. Electronically Signed   By: Lyndall Gore M.D.   On: 12/07/2023 14:11   DG Pelvis Portable Result Date: 12/07/2023 CLINICAL DATA:  Status post fall EXAM: PORTABLE PELVIS 1 VIEWS COMPARISON:  None Available. FINDINGS: There is no evidence of pelvic fracture or diastasis. No pelvic bone lesions are seen. Degenerative changes of the bilateral hips. IMPRESSION: No radiographic finding of acute displaced fracture. Electronically Signed   By: Limin  Xu M.D.   On: 12/07/2023 09:25   CT Head Wo Contrast Result Date: 12/07/2023 CLINICAL DATA:  Head trauma, minor (Age >= 65y); Neck trauma (Age >= 65y). Fall on blood thinners. EXAM: CT HEAD WITHOUT CONTRAST CT CERVICAL SPINE WITHOUT CONTRAST TECHNIQUE: Multidetector CT imaging of the head and cervical spine was performed following the standard protocol without intravenous contrast. Multiplanar CT image reconstructions of the cervical spine were also generated. RADIATION DOSE REDUCTION: This exam was performed according to the departmental dose-optimization program which includes automated exposure control, adjustment of the mA and/or kV according to patient size and/or use of iterative reconstruction technique. COMPARISON:  Head CT 02/29/2004. FINDINGS: CT HEAD FINDINGS Brain: Hazy density underlying the calvarium along the left-greater-than-right bilateral cerebral convexities is favored to reflect cupping artifact given lack of vessel displacement. Cortical gray-white differentiation is preserved. Asymmetric volume loss of the left cerebral hemisphere with frontotemporal predominance. No acute hydrocephalus or midline shift. Vascular: No hyperdense vessel or unexpected calcification. Skull: No calvarial fracture or suspicious bone lesion. Skull base is unremarkable. Sinuses/Orbits: No acute finding. Other: Small right parietal scalp hematoma. CT CERVICAL  SPINE FINDINGS Alignment: 3 mm degenerative, stair step anterolisthesis from C5-T1. Skull base and vertebrae: No acute fracture. Severe degenerative changes of the craniocervical junction with moderate degenerative pannus resulting in at least mild spinal canal stenosis at C1-2. No suspicious bone lesions. Soft tissues and spinal canal: No prevertebral fluid or swelling. No visible canal hematoma. Disc levels: Multilevel cervical spondylosis with at least moderate spinal canal stenosis at C5-6. Upper chest: No acute findings. Other: Atherosclerotic calcifications of the carotid bulbs. IMPRESSION: 1. Hazy density underlying the calvarium along the left-greater-than-right bilateral cerebral convexities is favored to reflect cupping artifact given lack of vessel displacement. Consider follow-up head CT in 4-6 hours versus noncontrast brain MRI to exclude subdural hemorrhage. 2. Small right parietal scalp hematoma. 3. No acute cervical spine fracture. 4. Multilevel cervical spondylosis with at least moderate spinal canal stenosis at C5-6. These results were called by telephone at the time of interpretation on 12/07/2023 at 8:51 am to provider CHRISTIAN  PROSPERI , who verbally acknowledged these results. Electronically Signed   By: Ryan Chess M.D.   On: 12/07/2023 08:57   CT Cervical Spine Wo Contrast Result Date: 12/07/2023 CLINICAL DATA:  Head trauma, minor (Age >= 65y); Neck trauma (Age >= 65y). Fall on blood thinners. EXAM: CT HEAD WITHOUT CONTRAST CT CERVICAL SPINE WITHOUT CONTRAST TECHNIQUE: Multidetector CT imaging of the head and cervical spine was performed following the standard protocol without intravenous contrast. Multiplanar CT image reconstructions of the cervical spine were also generated. RADIATION DOSE REDUCTION: This exam was performed according to the departmental dose-optimization program which includes automated exposure control, adjustment of the mA and/or kV according to patient size  and/or use of iterative reconstruction technique. COMPARISON:  Head CT 02/29/2004. FINDINGS: CT HEAD FINDINGS Brain: Hazy density underlying the calvarium along the left-greater-than-right bilateral cerebral convexities is favored to reflect cupping artifact given lack of vessel displacement. Cortical gray-white differentiation is preserved. Asymmetric volume loss of the left cerebral hemisphere with frontotemporal predominance. No acute hydrocephalus or midline shift. Vascular: No hyperdense vessel or unexpected calcification. Skull: No calvarial fracture or suspicious bone lesion. Skull base is unremarkable. Sinuses/Orbits: No acute finding. Other: Small right parietal scalp hematoma. CT CERVICAL SPINE FINDINGS Alignment: 3 mm degenerative, stair step anterolisthesis from C5-T1. Skull base and vertebrae: No acute fracture. Severe degenerative changes of the craniocervical junction with moderate degenerative pannus resulting in at least mild spinal canal stenosis at C1-2. No suspicious bone lesions. Soft tissues and spinal canal: No prevertebral fluid or swelling. No visible canal hematoma. Disc levels: Multilevel cervical spondylosis with at least moderate spinal canal stenosis at C5-6. Upper chest: No acute findings. Other: Atherosclerotic calcifications of the carotid bulbs. IMPRESSION: 1. Hazy density underlying the calvarium along the left-greater-than-right bilateral cerebral convexities is favored to reflect cupping artifact given lack of vessel displacement. Consider follow-up head CT in 4-6 hours versus noncontrast brain MRI to exclude subdural hemorrhage. 2. Small right parietal scalp hematoma. 3. No acute cervical spine fracture. 4. Multilevel cervical spondylosis with at least moderate spinal canal stenosis at C5-6. These results were called by telephone at the time of interpretation on 12/07/2023 at 8:51 am to provider [facility] , who verbally acknowledged these results. Electronically Signed    By: Ryan Chess M.D.   On: 12/07/2023 08:57   DG Chest Portable 1 View Result Date: 12/07/2023 CLINICAL DATA:  Status post fall EXAM: PORTABLE CHEST 1 VIEW COMPARISON:  Chest radiograph dated 08/10/2023 FINDINGS: Asymmetric elevation of the right hemidiaphragm with asymmetrically lower right lung volumes. Bibasilar patchy and linear opacities. Questionable trace bilateral pleural effusions. No pneumothorax. Similar postsurgical cardiomediastinal silhouette. Median sternotomy wires are nondisplaced. Unchanged fracture of the inferior most wire. Degenerative changes of the right shoulder. No radiographic finding of acute displaced fracture. IMPRESSION: 1. Bibasilar patchy and linear opacities, likely atelectasis. 2.  No radiographic finding of acute displaced fracture. 3. Questionable trace bilateral pleural effusions. Electronically Signed   By: Limin  Xu M.D.   On: 12/07/2023 08:52        Assessment/Plan   Lower Back Pain New onset, localized, no radiation.  Pt had recent fall resulting in ED visit  Using heating pad for relief. Will order tylenol  650 mg q6 prn  CABG   Denies chest pain , sob  Will cont with aspirin ,  plavix , imdur , lipitor, metoprolol    HTN  Bp 134/56 On amlodipine, Imdur   Hypothyroidism  TSH 2.78 Cont with synthyroid  75mcg daily    Lower Extremity  Edema  No recent changes or worsening. Cont with compression stockings  DM A1c 6.5 Diet controlled   Elevated LFT  Pt denies abdominal pain, nausea, vomiting Will repeat cmp in 2 weeks   Anemia Hb 9.1 No signs of bleeding Will check iron studies   General Health Maintenance -Encourage participation in activities and walking for overall health and well-being. -Continue regular bowel movements. -Ensure good sleep hygiene.    Care plan discussed with the nursing staff 40 minTotal time spent for obtaining history,  performing a medically appropriate examination and evaluation, reviewing the tests,   documenting clinical information in the electronic or other health record, independently interpreting results ,care coordination (not separately reported)

## 2024-01-14 ENCOUNTER — Non-Acute Institutional Stay: Payer: Medicare Other | Admitting: Nurse Practitioner

## 2024-01-14 ENCOUNTER — Encounter: Payer: Self-pay | Admitting: Nurse Practitioner

## 2024-01-14 DIAGNOSIS — N952 Postmenopausal atrophic vaginitis: Secondary | ICD-10-CM | POA: Diagnosis not present

## 2024-01-14 DIAGNOSIS — R350 Frequency of micturition: Secondary | ICD-10-CM

## 2024-01-14 DIAGNOSIS — D649 Anemia, unspecified: Secondary | ICD-10-CM

## 2024-01-14 DIAGNOSIS — I10 Essential (primary) hypertension: Secondary | ICD-10-CM | POA: Diagnosis not present

## 2024-01-14 DIAGNOSIS — M545 Low back pain, unspecified: Secondary | ICD-10-CM

## 2024-01-14 DIAGNOSIS — E039 Hypothyroidism, unspecified: Secondary | ICD-10-CM

## 2024-01-14 DIAGNOSIS — R609 Edema, unspecified: Secondary | ICD-10-CM

## 2024-01-14 DIAGNOSIS — Z951 Presence of aortocoronary bypass graft: Secondary | ICD-10-CM | POA: Diagnosis not present

## 2024-01-14 DIAGNOSIS — G8929 Other chronic pain: Secondary | ICD-10-CM

## 2024-01-14 DIAGNOSIS — E119 Type 2 diabetes mellitus without complications: Secondary | ICD-10-CM | POA: Diagnosis not present

## 2024-01-14 DIAGNOSIS — E559 Vitamin D deficiency, unspecified: Secondary | ICD-10-CM

## 2024-01-14 DIAGNOSIS — E785 Hyperlipidemia, unspecified: Secondary | ICD-10-CM | POA: Diagnosis not present

## 2024-01-14 DIAGNOSIS — R7989 Other specified abnormal findings of blood chemistry: Secondary | ICD-10-CM

## 2024-01-14 NOTE — Assessment & Plan Note (Signed)
Elevated LFT 01/06/24, pending repeated CMP, asymptomatic

## 2024-01-14 NOTE — Assessment & Plan Note (Signed)
Blood pressure is controlled, taking Metoprolol, Amlodipine,

## 2024-01-14 NOTE — Assessment & Plan Note (Signed)
taking Estrace vaginal cream.

## 2024-01-14 NOTE — Assessment & Plan Note (Signed)
on Vit D

## 2024-01-14 NOTE — Assessment & Plan Note (Signed)
Urinary frequency, increased urinary frequency, incontinence, but denied dysuria, she is afebrile.  UA C/S to r/o UTI

## 2024-01-14 NOTE — Assessment & Plan Note (Signed)
BLE,  chronic, minimal

## 2024-01-14 NOTE — Assessment & Plan Note (Signed)
Hgb A1c 6.5 01/06/24, diet controlled.

## 2024-01-14 NOTE — Assessment & Plan Note (Signed)
hx of CABG, on Plavix, ASA, Atorvastatin, Isosorbide.

## 2024-01-14 NOTE — Assessment & Plan Note (Signed)
TSH 2.78 01/06/24, on Levothyroxine.

## 2024-01-14 NOTE — Assessment & Plan Note (Signed)
pending anemia panel, Hgb 9.2 01/06/24

## 2024-01-14 NOTE — Assessment & Plan Note (Addendum)
X-ray lumbar spine, sacrum, pelvis, continue Tylenol, adding Norco 5/325mg  1/2 tab hs to help the patient sleep/rest at night.  persisted R lower back pain, fall about a month ago, placed on Tylenol-no efficacy, interfere her night sleep, desires something stronger at night. Negative for fx or displacement  X-ray pelvis 12/07/23

## 2024-01-14 NOTE — Progress Notes (Unsigned)
Location:   [facility] Room Number: 818 Place of Service:  ALF (13) Provider: Arna Snipe Sanyiah Kanzler NP  Geoffry Paradise, MD  Patient Care Team: Geoffry Paradise, MD as PCP - General (Internal Medicine) Runell Gess, MD as PCP - Cardiology (Cardiology) Janalyn Harder, MD (Inactive) as Consulting Physician (Dermatology)  Extended Emergency Contact Information Primary Emergency Contact: [name],[name] Address: [address]          [place] [address] [place] Home Phone: [phone] Mobile Phone: [phone] Relation: Daughter Secondary Emergency Contact: [name] [place] Home Phone: [phone] Work Phone: [phone] Relation: Daughter  Code Status: DNR Goals of care: Advanced Directive information    01/05/2024   11:46 AM  Advanced Directives  Does Patient Have a Medical Advance Directive? No  Would patient like information on creating a medical advance directive? No - Patient declined     Chief Complaint  Patient presents with   Acute Visit    R lower back pain, increased urinary frequency/incontinence    HPI:  Pt is a 89 y.o. female seen today for an acute visit for persisted R lower back pain, fall about a month ago, placed on Tylenol-no efficacy, interfere her night sleep, desires something stronger at night. Negative for fx or displacement  X-ray pelvis 12/07/23   HTN, taking Metoprolol, Amlodipine,   CAD, hx of CABG, on Plavix, ASA, Atorvastatin, Isosorbide.   Vit D deficiency, on Vit D  Atrophic vaginitis, taking Estrace vaginal cream.   Anemia, pending anemia panel, Hgb 9.2 01/06/24  Elevated LFT 01/06/24, pending repeated CMP  T2DM, Hgb A1c 6.5 01/06/24, diet controlled.   Edema BLE, chronic, minimal  Hypothyroidism, TSH 2.78 01/06/24, on Levothyroxine.   HLD LDL 84 01/06/24, on Atorvastatin.   Urinary frequency, increased urinary frequency, incontinence, but denied dysuria, she is afebrile.  Past  Medical History:  Diagnosis Date   BCC (basal cell carcinoma of skin) 01/09/2009   Lower Central Back (tx p bx)   CAD (coronary artery disease)    possible ant wall MI ('97) - cath & CABG x5   Exogenous obesity    GERD (gastroesophageal reflux disease)    History of nuclear stress test 09/2012   lexiscan; mild perfusion defect in apical anterior & apical region (infarct/scar) - no significant ischemia, low risk    History of total bilateral knee replacement    Hypertension    Hypothyroidism    Nodular basal cell carcinoma (BCC) 08/17/2017   Left Calf (tx p bx)   PVC's (premature ventricular contractions)    SCC (squamous cell carcinoma) [place] 01/29/2004   Right Forearm (Cx3,5FU)   SCCA (squamous cell carcinoma) of skin 03/13/2005   Mid Upper Back   SCCA (squamous cell carcinoma) of skin 09/07/2005   Left Elbow(Cx3,5FU)   SCCA (squamous cell carcinoma) of skin 01/25/2006   Left Brow(in situ) (Cx3,5FU)   SCCA (squamous cell carcinoma) of skin 04/27/2006   Inner Left Shoulder(Keratoacanthoma) (Exc.)   SCCA (squamous cell carcinoma) of skin 03/22/2007   Left Temple(in situ) and Bridge of Nose(in situ) (Cs3,5FU)   SCCA (squamous cell carcinoma) of skin 05/12/2007   Top of Scalp(Cx3,5FU)   SCCA (squamous cell carcinoma) of skin 01/28/2011   Right Upper Arm(in situ)   SCCA (squamous cell carcinoma) of skin 07/26/2012   Glabella, Inferior Tip (Cx3,5FU)   SCCA (squamous cell carcinoma) of skin 03/08/2013   Left Front Scalp(in situ) and Upper Nose(in situ) (Cx3,5FU)   SCCA (squamous  cell carcinoma) of skin 07/24/2013   Right Lower Leg(Keratoacanthoma)   SCCA (squamous cell carcinoma) of skin 09/11/2015   Left Scalp(in situ) (Cx3,5FU)   SCCA (squamous cell carcinoma) of skin 01/15/2016   Left Forearm(in situ) and Left Temple(in situ) (tx p bx)   SCCA (squamous cell carcinoma) of skin 10/01/2016   Left Mid Forearm(in situ)(tx p bx) and Left Front Scalp(watch)   SCCA (squamous cell  carcinoma) of skin 11/12/2016   Left Temple(Keratoacanthoma) (watch)   SCCA (squamous cell carcinoma) of skin 02/11/2017   Top Left Hand(Keratoacanthoma) (tx p bx)   Squamous cell carcinoma in situ (SCCIS) 11/18/1999   Above Left Outer Eyebrow   Squamous cell carcinoma of scalp    removed - Dr. Jorja Loa   Superficial basal cell carcinoma (BCC) 07/14/2004   Left Scapula (Cx3,5FU)   Past Surgical History:  Procedure Laterality Date   ABDOMINAL HYSTERECTOMY  1984   APPENDECTOMY  1941   Carotid Doppler  12/07/2012   R & L ICA - 0-49% diameter reduction   CORONARY ARTERY BYPASS GRAFT  09/1996   cath & CABG x5 LIMA-LAD, SVG-sequential OD & OM, SVG sequential to PDA & PLA (Dr. Donata Clay)   REPLACEMENT TOTAL KNEE Bilateral 2001 & 2003   TONSILLECTOMY     TRANSTHORACIC ECHOCARDIOGRAM  08/23/2013   EF 50-55%, LV cavity size mod reduced, mild LVH, mild conc hypertrophy; mild AV stenosis & mild regurg; mild MR - ordered for murmur    Allergies  Allergen Reactions   Mobic [Meloxicam] Other (See Comments)    Unknown reaction   Novocain [Procaine] Other (See Comments)    Unknown reaction    Allergies as of 01/14/2024       Reactions   Mobic [meloxicam] Other (See Comments)   Unknown reaction   Novocain [procaine] Other (See Comments)   Unknown reaction        Medication List        Accurate as of January 14, 2024 11:59 PM. If you have any questions, ask your nurse or doctor.          acetaminophen 500 MG tablet Commonly known as: TYLENOL Take 500 mg by mouth at bedtime as needed (pain).   acetaminophen 325 MG tablet Commonly known as: TYLENOL Take 650 mg by mouth every 4 (four) hours as needed.   amLODipine 5 MG tablet Commonly known as: NORVASC Take 5 mg by mouth daily.   Aspirin 81 MG Caps Take 81 mg by mouth daily.   atorvastatin 40 MG tablet Commonly known as: LIPITOR TAKE 1 TABLET BY MOUTH EVERY DAY   cholecalciferol 25 MCG (1000 UNIT) tablet Commonly known  as: VITAMIN D3 Take 1,000 Units by mouth daily.   clopidogrel 75 MG tablet Commonly known as: PLAVIX TAKE 1 TABLET BY MOUTH EVERY DAY   estradiol 0.1 MG/GM vaginal cream Commonly known as: ESTRACE Place 1 Applicatorful vaginally See admin instructions. Apply 1g vaginally in the evening on Monday, Wednesday, Friday.   hydrocortisone 2.5 % cream Apply 1 Application topically at bedtime.   isosorbide mononitrate 60 MG 24 hr tablet Commonly known as: IMDUR TAKE 1 TABLET BY MOUTH EVERY DAY   levothyroxine 75 MCG tablet Commonly known as: SYNTHROID Take 75 mcg by mouth daily before breakfast.   metoprolol succinate 100 MG 24 hr tablet Commonly known as: TOPROL-XL TAKE 1 TABLET BY MOUTH EVERY DAY WITH OR IMMEDIATELY FOLLOWING A MEAL   multivitamin with minerals Tabs tablet Take 1 tablet by mouth daily.  mupirocin ointment 2 % Commonly known as: BACTROBAN Apply 1 Application topically daily as needed (irritation).   nitroGLYCERIN 0.4 MG SL tablet Commonly known as: NITROSTAT Place 0.4 mg under the tongue every 5 (five) minutes as needed for chest pain.   potassium chloride 10 MEQ tablet Commonly known as: KLOR-CON Take 10 mEq by mouth daily as needed.        Review of Systems  Constitutional:  Negative for appetite change, fatigue and fever.  HENT:  Positive for hearing loss. Negative for congestion and trouble swallowing.   Eyes:  Negative for visual disturbance.  Respiratory:  Negative for cough, chest tightness and wheezing.   Cardiovascular:  Positive for leg swelling.  Gastrointestinal:  Negative for abdominal pain, constipation, diarrhea, nausea and vomiting.  Genitourinary:  Positive for frequency and urgency. Negative for dysuria.       Incontinent of urine.   Musculoskeletal:  Positive for arthralgias, back pain and gait problem.  Neurological:  Negative for speech difficulty, weakness and headaches.  Psychiatric/Behavioral:  Negative for confusion and sleep  disturbance. The patient is not nervous/anxious.     Immunization History  Administered Date(s) Administered   Fluad Quad(high Dose 65+) 08/30/2019   Influenza, High Dose Seasonal PF 10/10/2018   Pertinent  Health Maintenance Due  Topic Date Due   FOOT EXAM  Never done   OPHTHALMOLOGY EXAM  Never done   HEMOGLOBIN A1C  01/08/2022   INFLUENZA VACCINE  07/29/2023   DEXA SCAN  Completed      07/09/2021   10:00 PM 07/10/2021    8:53 AM 07/10/2021    9:00 PM 07/11/2021    8:40 AM 07/21/2021   10:02 AM  Fall Risk  (RETIRED) Patient Fall Risk Level Moderate fall risk Moderate fall risk Moderate fall risk Moderate fall risk Low fall risk   Functional Status Survey:    Vitals:   01/14/24 1541  BP: 137/60  Pulse: 75  Resp: 18  Temp: (!) 97.3 F (36.3 C)  SpO2: 99%  Weight: 158 lb 3.2 oz (71.8 kg)   Body mass index is 26.33 kg/m. Physical Exam Vitals and nursing note reviewed.  Constitutional:      Appearance: Normal appearance.  HENT:     Head: Normocephalic and atraumatic.     Nose: Nose normal.     Mouth/Throat:     Mouth: Mucous membranes are moist.  Eyes:     Extraocular Movements: Extraocular movements intact.     Conjunctiva/sclera: Conjunctivae normal.     Pupils: Pupils are equal, round, and reactive to light.  Cardiovascular:     Rate and Rhythm: Normal rate and regular rhythm.     Heart sounds: Murmur heard.  Pulmonary:     Effort: Pulmonary effort is normal.     Breath sounds: No rales.  Abdominal:     General: Bowel sounds are normal.     Palpations: Abdomen is soft.     Tenderness: There is no abdominal tenderness. There is no right CVA tenderness, left CVA tenderness, guarding or rebound.  Musculoskeletal:        General: Tenderness present.     Cervical back: Normal range of motion and neck supple.     Right lower leg: Edema present.     Left lower leg: Edema present.     Comments: R SIJ tenderness palpated, with movement, better on heating  pad Trace edema BLE  Skin:    General: Skin is warm and dry.  Neurological:  General: No focal deficit present.     Mental Status: She is alert and oriented to person, place, and time. Mental status is at baseline.     Motor: No weakness.     Coordination: Coordination normal.     Gait: Gait abnormal.  Psychiatric:        Mood and Affect: Mood normal.        Behavior: Behavior normal.        Thought Content: Thought content normal.        Judgment: Judgment normal.     Labs reviewed: Recent Labs    07/07/23 2302 08/10/23 1548 08/11/23 0927 10/05/23 0600  NA 137 132* 133* 138  K 4.0 2.8* 3.5 4.2  CL 100 101 103 104  CO2 24 23 20* 28  GLUCOSE 121* 140* 115* 90  BUN 15 8 7* 14  CREATININE 0.93 0.82 0.79 0.87  CALCIUM 8.7* 8.1* 8.0* 8.6  MG 1.8 1.7  --   --    Recent Labs    08/10/23 1548  AST 23  ALT 14  ALKPHOS 108  BILITOT 0.9  PROT 6.7  ALBUMIN 2.8*   Recent Labs    07/07/23 2302 08/10/23 1548  WBC 7.7 6.0  NEUTROABS 4.2 4.0  HGB 10.4* 10.1*  HCT 32.7* 31.1*  MCV 104.8* 100.0  PLT 203 196   Lab Results  Component Value Date   TSH 2.414 07/08/2021   Lab Results  Component Value Date   HGBA1C 6.3 (H) 07/08/2021   Lab Results  Component Value Date   CHOL 119 07/09/2021   HDL 40 (L) 07/09/2021   LDLCALC 60 07/09/2021   TRIG 97 07/09/2021   CHOLHDL 3.0 07/09/2021    Significant Diagnostic Results in last 30 days:  No results found.  Assessment/Plan: Chronic lower back pain X-ray lumbar spine, sacrum, pelvis, continue Tylenol, adding Norco 5/325mg  1/2 tab hs to help the patient sleep/rest at night.  persisted R lower back pain, fall about a month ago, placed on Tylenol-no efficacy, interfere her night sleep, desires something stronger at night. Negative for fx or displacement  X-ray pelvis 12/07/23   Urinary frequency Urinary frequency, increased urinary frequency, incontinence, but denied dysuria, she is afebrile.  UA C/S to r/o  UTI  Atrophic vaginitis  taking Estrace vaginal cream.   Dyslipidemia LDL 84 01/06/24, on Atorvastatin.   Hypothyroidism TSH 2.78 01/06/24, on Levothyroxine.   Edema BLE,  chronic, minimal  Type 2 diabetes, diet controlled (HCC) Hgb A1c 6.5 01/06/24, diet controlled.   Elevated liver function tests Elevated LFT 01/06/24, pending repeated CMP, asymptomatic  Anemia pending anemia panel, Hgb 9.2 01/06/24  Vitamin D deficiency on Vit D  S/P CABG x 3 hx of CABG, on Plavix, ASA, Atorvastatin, Isosorbide.   HTN (hypertension) Blood pressure is controlled, taking Metoprolol, Amlodipine,     Family/ staff Communication: plan of care reviewed with the patient and charge nurse.   Labs/tests ordered:  X-ray lumbar spine, sacrum, pelvis  Time spend 40 minutes.

## 2024-01-14 NOTE — Assessment & Plan Note (Signed)
LDL 84 01/06/24, on Atorvastatin.

## 2024-01-17 DIAGNOSIS — M48061 Spinal stenosis, lumbar region without neurogenic claudication: Secondary | ICD-10-CM | POA: Diagnosis not present

## 2024-01-17 DIAGNOSIS — R300 Dysuria: Secondary | ICD-10-CM | POA: Diagnosis not present

## 2024-01-17 DIAGNOSIS — M533 Sacrococcygeal disorders, not elsewhere classified: Secondary | ICD-10-CM | POA: Diagnosis not present

## 2024-01-17 DIAGNOSIS — M47816 Spondylosis without myelopathy or radiculopathy, lumbar region: Secondary | ICD-10-CM | POA: Diagnosis not present

## 2024-01-17 DIAGNOSIS — M4186 Other forms of scoliosis, lumbar region: Secondary | ICD-10-CM | POA: Diagnosis not present

## 2024-01-18 ENCOUNTER — Encounter: Payer: Self-pay | Admitting: Nurse Practitioner

## 2024-01-18 NOTE — Progress Notes (Signed)
 This encounter was created in error - please disregard.

## 2024-01-25 ENCOUNTER — Encounter: Payer: Self-pay | Admitting: Nurse Practitioner

## 2024-01-25 ENCOUNTER — Non-Acute Institutional Stay: Payer: Medicare Other | Admitting: Nurse Practitioner

## 2024-01-25 DIAGNOSIS — E119 Type 2 diabetes mellitus without complications: Secondary | ICD-10-CM | POA: Diagnosis not present

## 2024-01-25 DIAGNOSIS — Z0000 Encounter for general adult medical examination without abnormal findings: Secondary | ICD-10-CM

## 2024-01-25 DIAGNOSIS — R600 Localized edema: Secondary | ICD-10-CM | POA: Diagnosis not present

## 2024-01-25 DIAGNOSIS — I10 Essential (primary) hypertension: Secondary | ICD-10-CM | POA: Diagnosis not present

## 2024-01-25 DIAGNOSIS — E785 Hyperlipidemia, unspecified: Secondary | ICD-10-CM | POA: Diagnosis not present

## 2024-01-25 NOTE — Progress Notes (Unsigned)
Subjective:   Kathryn Carlson is a 89 y.o. female who presents for Medicare Annual (Subsequent) preventive examination.  Visit Complete: In person  Patient Medicare AWV questionnaire was completed by the patient on 01/25/24; I have confirmed that all information answered by patient is correct and no changes since this date.  Cardiac Risk Factors include: advanced age (>55men, >61 women);diabetes mellitus;dyslipidemia;hypertension     Objective:    Today's Vitals   01/25/24 1224  BP: (!) 128/56  Pulse: 70  SpO2: 99%  Weight: 159 lb 12.8 oz (72.5 kg)  Height: 5\' 5"  (1.651 m)   Body mass index is 26.59 kg/m.     01/05/2024   11:46 AM 12/07/2023    9:48 AM 08/10/2023    3:51 PM 07/07/2023    9:47 PM 07/21/2021   10:02 AM 07/08/2021    4:26 PM 07/08/2021    9:24 AM  Advanced Directives  Does Patient Have a Medical Advance Directive? No Yes No No No Yes Yes  Type of Advance Directive  Healthcare Power of [employer] Power of Attorney   Does patient want to make changes to medical advance directive?     No - Patient declined No - Patient declined   Copy of Healthcare Power of Attorney in Chart?      No - copy requested   Would patient like information on creating a medical advance directive? No - Patient declined   No - Patient declined       Current Medications (verified) Outpatient Encounter Medications as of 01/25/2024  Medication Sig   acetaminophen (TYLENOL) 325 MG tablet Take 650 mg by mouth every 4 (four) hours as needed.   acetaminophen (TYLENOL) 500 MG tablet Take 500 mg by mouth at bedtime as needed (pain).   amLODipine (NORVASC) 5 MG tablet Take 5 mg by mouth daily.   Aspirin 81 MG CAPS Take 81 mg by mouth daily.   atorvastatin (LIPITOR) 40 MG tablet TAKE 1 TABLET BY MOUTH EVERY DAY   cholecalciferol (VITAMIN D3) 25 MCG (1000 UNIT) tablet Take 1,000 Units by mouth daily.   clopidogrel (PLAVIX) 75 MG tablet TAKE 1 TABLET BY MOUTH EVERY DAY   estradiol  (ESTRACE) 0.1 MG/GM vaginal cream Place 1 Applicatorful vaginally See admin instructions. Apply 1g vaginally in the evening on Monday, Wednesday, Friday.   hydrocortisone 2.5 % cream Apply 1 Application topically at bedtime.   isosorbide mononitrate (IMDUR) 60 MG 24 hr tablet TAKE 1 TABLET BY MOUTH EVERY DAY   levothyroxine (SYNTHROID) 75 MCG tablet Take 75 mcg by mouth daily before breakfast.   metoprolol succinate (TOPROL-XL) 100 MG 24 hr tablet TAKE 1 TABLET BY MOUTH EVERY DAY WITH OR IMMEDIATELY FOLLOWING A MEAL   Multiple Vitamin (MULTIVITAMIN WITH MINERALS) TABS tablet Take 1 tablet by mouth daily.   mupirocin ointment (BACTROBAN) 2 % Apply 1 Application topically daily as needed (irritation).   nitroGLYCERIN (NITROSTAT) 0.4 MG SL tablet Place 0.4 mg under the tongue every 5 (five) minutes as needed for chest pain.   potassium chloride (KLOR-CON) 10 MEQ tablet Take 10 mEq by mouth daily as needed.   Facility-Administered Encounter Medications as of 01/25/2024  Medication   triamcinolone acetonide (KENALOG-40) injection 40 mg    Allergies (verified) Mobic [meloxicam] and Novocain [procaine]   History: Past Medical History:  Diagnosis Date   BCC (basal cell carcinoma of skin) 01/09/2009   Lower Central Back (tx p bx)   CAD (coronary artery disease)  possible ant wall MI ('97) - cath & CABG x5   Exogenous obesity    GERD (gastroesophageal reflux disease)    History of nuclear stress test 09/2012   lexiscan; mild perfusion defect in apical anterior & apical region (infarct/scar) - no significant ischemia, low risk    History of total bilateral knee replacement    Hypertension    Hypothyroidism    Nodular basal cell carcinoma (BCC) 08/17/2017   Left Calf (tx p bx)   PVC's (premature ventricular contractions)    SCC (squamous cell carcinoma) [place] 01/29/2004   Right Forearm (Cx3,5FU)   SCCA (squamous cell carcinoma) of skin 03/13/2005   Mid Upper Back   SCCA (squamous cell  carcinoma) of skin 09/07/2005   Left Elbow(Cx3,5FU)   SCCA (squamous cell carcinoma) of skin 01/25/2006   Left Brow(in situ) (Cx3,5FU)   SCCA (squamous cell carcinoma) of skin 04/27/2006   Inner Left Shoulder(Keratoacanthoma) (Exc.)   SCCA (squamous cell carcinoma) of skin 03/22/2007   Left Temple(in situ) and Bridge of Nose(in situ) (Cs3,5FU)   SCCA (squamous cell carcinoma) of skin 05/12/2007   Top of Scalp(Cx3,5FU)   SCCA (squamous cell carcinoma) of skin 01/28/2011   Right Upper Arm(in situ)   SCCA (squamous cell carcinoma) of skin 07/26/2012   Glabella, Inferior Tip (Cx3,5FU)   SCCA (squamous cell carcinoma) of skin 03/08/2013   Left Front Scalp(in situ) and Upper Nose(in situ) (Cx3,5FU)   SCCA (squamous cell carcinoma) of skin 07/24/2013   Right Lower Leg(Keratoacanthoma)   SCCA (squamous cell carcinoma) of skin 09/11/2015   Left Scalp(in situ) (Cx3,5FU)   SCCA (squamous cell carcinoma) of skin 01/15/2016   Left Forearm(in situ) and Left Temple(in situ) (tx p bx)   SCCA (squamous cell carcinoma) of skin 10/01/2016   Left Mid Forearm(in situ)(tx p bx) and Left Front Scalp(watch)   SCCA (squamous cell carcinoma) of skin 11/12/2016   Left Temple(Keratoacanthoma) (watch)   SCCA (squamous cell carcinoma) of skin 02/11/2017   Top Left Hand(Keratoacanthoma) (tx p bx)   Squamous cell carcinoma in situ (SCCIS) 11/18/1999   Above Left Outer Eyebrow   Squamous cell carcinoma of scalp    removed - Dr. Jorja Loa   Superficial basal cell carcinoma (BCC) 07/14/2004   Left Scapula (Cx3,5FU)   Past Surgical History:  Procedure Laterality Date   ABDOMINAL HYSTERECTOMY  1984   APPENDECTOMY  1941   Carotid Doppler  12/07/2012   R & L ICA - 0-49% diameter reduction   CORONARY ARTERY BYPASS GRAFT  09/1996   cath & CABG x5 LIMA-LAD, SVG-sequential OD & OM, SVG sequential to PDA & PLA (Dr. Donata Clay)   REPLACEMENT TOTAL KNEE Bilateral 2001 & 2003   TONSILLECTOMY     TRANSTHORACIC  ECHOCARDIOGRAM  08/23/2013   EF 50-55%, LV cavity size mod reduced, mild LVH, mild conc hypertrophy; mild AV stenosis & mild regurg; mild MR - ordered for murmur   Family History  Problem Relation Age of Onset   Heart attack Mother    Heart attack Father    Cancer Sister    Diabetes Sister    Heart Problems Sister    Social History   Socioeconomic History   Marital status: Widowed    Spouse name: Not on file   Number of children: 4   Years of education: 33   Highest education level: Not on file  Occupational History   Not on file  Tobacco Use   Smoking status: Never   Smokeless tobacco: Never  Vaping Use   Vaping status: Not on file  Substance and Sexual Activity   Alcohol use: Yes    Comment: "a drink of wine every now and then"   Drug use: Not on file   Sexual activity: Not on file  Other Topics Concern   Not on file  Social History Narrative   Not on file   Social Drivers of Health   Financial Resource Strain: Not on file  Food Insecurity: Not on file  Transportation Needs: Not on file  Physical Activity: Not on file  Stress: Not on file  Social Connections: Not on file    Tobacco Counseling Counseling given: Not Answered   Clinical Intake:  Pre-visit preparation completed: Yes  Pain : No/denies pain     BMI - recorded: 26.59 Nutritional Status: BMI 25 -29 Overweight Nutritional Risks: None  How often do you need to have someone help you when you read instructions, pamphlets, or other written materials from your doctor or pharmacy?: 4 - Often What is the last grade level you completed in school?: college  Interpreter Needed?: No  Information entered by :: Jayleen Afonso Nedra Hai NP   Activities of Daily Living    01/25/2024    1:46 PM  In your present state of health, do you have any difficulty performing the following activities:  Hearing? 0  Vision? 0  Difficulty concentrating or making decisions? 1  Walking or climbing stairs? 1  Dressing or  bathing? 1  Doing errands, shopping? 1  Preparing Food and eating ? N  Using the Toilet? N  In the past six months, have you accidently leaked urine? Y  Do you have problems with loss of bowel control? N  Managing your Medications? Y  Managing your Finances? Y  Housekeeping or managing your Housekeeping? Y    Patient Care Team: Gerlad Pelzel X, NP as PCP - General (Internal Medicine) Allyson Sabal Delton See, MD as PCP - Cardiology (Cardiology) Janalyn Harder, MD (Inactive) as Consulting Physician (Dermatology)  Indicate any recent Medical Services you may have received from other than [facility] providers in the past year (date may be approximate).     Assessment:   This is a routine wellness examination for Kathryn Carlson.  Hearing/Vision screen No results found.   Goals Addressed             This Visit's Progress    Maintain Mobility and Function       Evidence-based guidance:  Acknowledge and validate impact of pain, loss of strength and potential disfigurement (hand osteoarthritis) on mental health and daily life, such as social isolation, anxiety, depression, impaired sexual relationship and   injury from falls.  Anticipate referral to physical or occupational therapy for assessment, therapeutic exercise and recommendation for adaptive equipment or assistive devices; encourage participation.  Assess impact on ability to perform activities of daily living, as well as engage in sports and leisure events or requirements of work or school.  Provide anticipatory guidance and reassurance about the benefit of exercise to maintain function; acknowledge and normalize fear that exercise may worsen symptoms.  Encourage regular exercise, at least 10 minutes at a time for 45 minutes per week; consider yoga, water exercise and proprioceptive exercises; encourage use of wearable activity tracker to increase motivation and adherence.  Encourage maintenance or resumption of daily activities, including  employment, as pain allows and with minimal exposure to trauma.  Assist patient to advocate for adaptations to the work environment.  Consider level of pain and  function, gender, age, lifestyle, patient preference, quality of life, readiness and ?ocapacity to benefit? when recommending patients for orthopaedic surgery consultation.  Explore strategies, such as changes to medication regimen or activity that enables patient to anticipate and manage flare-ups that increase deconditioning and disability.  Explore patient preferences; encourage exposure to a broader range of activities that have been avoided for fear of experiencing pain.  Identify barriers to participation in therapy or exercise, such as pain with activity, anticipated or imagined pain.  Monitor postoperative joint replacement or any preexisting joint replacement for ongoing pain and loss of function; provide social support and encouragement throughout recovery.   Notes:        Depression Screen    01/25/2024    1:47 PM  PHQ 2/9 Scores  PHQ - 2 Score 0    Fall Risk     No data to display          MEDICARE RISK AT HOME: Medicare Risk at Home Any stairs in or around the home?: Yes If so, are there any without handrails?: No Home free of loose throw rugs in walkways, pet beds, electrical cords, etc?: Yes Adequate lighting in your home to reduce risk of falls?: Yes Life alert?: No Use of a cane, walker or w/c?: Yes Grab bars in the bathroom?: Yes Shower chair or bench in shower?: Yes Elevated toilet seat or a handicapped toilet?: Yes  TIMED UP AND GO:  Was the test performed?  Yes  Length of time to ambulate 10 feet: 10 sec Gait slow and steady with assistive device    Cognitive Function:        Immunizations Immunization History  Administered Date(s) Administered   Fluad Quad(high Dose 65+) 08/30/2019   Influenza, High Dose Seasonal PF 10/10/2018, 09/29/2023   Pneumococcal Conjugate-13 01/28/2013    Td (Adult) 09/27/2022   Unspecified SARS-COV-2 Vaccination 01/01/2020, 09/16/2021    TDAP status: Due, Education has been provided regarding the importance of this vaccine. Advised may receive this vaccine at local pharmacy or Health Dept. Aware to provide a copy of the vaccination record if obtained from local pharmacy or Health Dept. Verbalized acceptance and understanding.  Flu Vaccine status: Up to date  Pneumococcal vaccine status: Due, Education has been provided regarding the importance of this vaccine. Advised may receive this vaccine at local pharmacy or Health Dept. Aware to provide a copy of the vaccination record if obtained from local pharmacy or Health Dept. Verbalized acceptance and understanding.  Covid-19 vaccine status: Completed vaccines  Qualifies for Shingles Vaccine? Yes   Zostavax completed Yes   Shingrix Completed?: No.    Education has been provided regarding the importance of this vaccine. Patient has been advised to call insurance company to determine out of pocket expense if they have not yet received this vaccine. Advised may also receive vaccine at local pharmacy or Health Dept. Verbalized acceptance and understanding.  Screening Tests Health Maintenance  Topic Date Due   FOOT EXAM  Never done   OPHTHALMOLOGY EXAM  Never done   Zoster Vaccines- Shingrix (1 of 2) Never done   Pneumonia Vaccine 62+ Years old (2 of 2 - PPSV23 or PCV20) 03/25/2013   COVID-19 Vaccine (3 - Mixed Product risk series) 10/14/2021   DTaP/Tdap/Td (1 - Tdap) 09/28/2022   HEMOGLOBIN A1C  07/05/2024   Medicare Annual Wellness (AWV)  01/24/2025   INFLUENZA VACCINE  Completed   DEXA SCAN  Completed   HPV VACCINES  Aged Out  Health Maintenance  Health Maintenance Due  Topic Date Due   FOOT EXAM  Never done   OPHTHALMOLOGY EXAM  Never done   Zoster Vaccines- Shingrix (1 of 2) Never done   Pneumonia Vaccine 43+ Years old (2 of 2 - PPSV23 or PCV20) 03/25/2013   COVID-19 Vaccine (3  - Mixed Product risk series) 10/14/2021   DTaP/Tdap/Td (1 - Tdap) 09/28/2022    Colorectal cancer screening: No longer required.   Mammogram status: No longer required due to aged out.  Bone Density status: Completed 08/10/2005. Results reflect: Bone density results: NORMAL. Repeat every ASAP years.  Lung Cancer Screening: (Low Dose CT Chest recommended if Age 56-80 years, 20 pack-year currently smoking OR have quit w/in 15years.) does not qualify.   Lung Cancer Screening Referral: NA  Additional Screening:  Hepatitis C Screening: does not qualify;  Vision Screening: Recommended annual ophthalmology exams for early detection of glaucoma and other disorders of the eye. Is the patient up to date with their annual eye exam?  No  Who is the provider or what is the name of the office in which the patient attends annual eye exams? [facility] will provide If pt is not established with a provider, would they like to be referred to a provider to establish care? No .   Dental Screening: Recommended annual dental exams for proper oral hygiene  Diabetic Foot Exam: Diabetic Foot Exam: Completed 01/25/24  Community Resource Referral / Chronic Care Management: CRR required this visit?  No   CCM required this visit?  No     Plan:     I have personally reviewed and noted the following in the patient's chart:   Medical and social history Use of alcohol, tobacco or illicit drugs  Current medications and supplements including opioid prescriptions. Patient is not currently taking opioid prescriptions. Functional ability and status Nutritional status Physical activity Advanced directives List of other physicians Hospitalizations, surgeries, and ER visits in previous 12 months Vitals Screenings to include cognitive, depression, and falls Referrals and appointments  In addition, I have reviewed and discussed with patient certain preventive protocols, quality metrics, and best practice  recommendations. A written personalized care plan for preventive services as well as general preventive health recommendations were provided to patient.     Margrett Kalb X Ravynn Hogate, NP   01/26/2024   After Visit Summary: (In Person-Declined) Patient declined AVS at this time.

## 2024-01-26 ENCOUNTER — Encounter: Payer: Self-pay | Admitting: Nurse Practitioner

## 2024-02-08 DIAGNOSIS — L570 Actinic keratosis: Secondary | ICD-10-CM | POA: Diagnosis not present

## 2024-02-08 DIAGNOSIS — L821 Other seborrheic keratosis: Secondary | ICD-10-CM | POA: Diagnosis not present

## 2024-02-08 DIAGNOSIS — S50811A Abrasion of right forearm, initial encounter: Secondary | ICD-10-CM | POA: Diagnosis not present

## 2024-02-08 DIAGNOSIS — L820 Inflamed seborrheic keratosis: Secondary | ICD-10-CM | POA: Diagnosis not present

## 2024-02-25 ENCOUNTER — Encounter: Payer: Self-pay | Admitting: Sports Medicine

## 2024-02-25 ENCOUNTER — Non-Acute Institutional Stay: Payer: Self-pay | Admitting: Sports Medicine

## 2024-02-25 DIAGNOSIS — M545 Low back pain, unspecified: Secondary | ICD-10-CM | POA: Diagnosis not present

## 2024-02-25 DIAGNOSIS — R748 Abnormal levels of other serum enzymes: Secondary | ICD-10-CM

## 2024-02-25 DIAGNOSIS — R296 Repeated falls: Secondary | ICD-10-CM

## 2024-02-25 DIAGNOSIS — G8929 Other chronic pain: Secondary | ICD-10-CM

## 2024-02-25 NOTE — Progress Notes (Signed)
 Provider:  Dr. Venita Sheffield Location:  [name] Place of Service:  ALF (13)  PCP: Mast, Man X, NP Patient Care Team: Mast, Man X, NP as PCP - General (Internal Medicine) Runell Gess, MD as PCP - Cardiology (Cardiology) Janalyn Harder, MD (Inactive) as Consulting Physician (Dermatology)  Extended Emergency Contact Information Primary Emergency Contact: [name],[name] Address: [address]          [name] [place] Home Phone: [phone] Mobile Phone: [phone] Relation: Daughter Secondary Emergency Contact: [name] [place] of [place] Home Phone: [phone] Work Phone: [phone] Relation: Daughter  Goals of Care: Advanced Directive information    01/05/2024   11:46 AM  Advanced Directives  Does Patient Have a Medical Advance Directive? No  Would patient like information on creating a medical advance directive? No - Patient declined      89yr old F with h/o HTN, CAD, Anemia, Hypothyroidism, HLD is seen today for acute visit. Pt seen and examined in her room  Pt reports that she fell couple of days ago and had another fall today.  States that her legs gave out Pt denies dizzy or lightheadedness Denies chest pain, palpitations, SOB,abdominal pain, nausea, vomiting, dysuria, hematuria. C/o lower back pain with no radiation She is able to ambulate with her walker  States that she does not have a good appetite but denies vomiting or abdominal pain.         Past Medical History:  Diagnosis Date   BCC (basal cell carcinoma of skin) 01/09/2009   Lower Central Back (tx p bx)   CAD (coronary artery disease)    possible ant wall MI ('97) - cath & CABG x5   Exogenous obesity    GERD (gastroesophageal reflux disease)    History of nuclear stress test 09/2012   lexiscan; mild perfusion defect in apical anterior & apical region (infarct/scar) - no significant ischemia, low risk     History of total bilateral knee replacement    Hypertension    Hypothyroidism    Nodular basal cell carcinoma (BCC) 08/17/2017   Left Calf (tx p bx)   PVC's (premature ventricular contractions)    SCC (squamous cell carcinoma) [place] 01/29/2004   Right Forearm (Cx3,5FU)   SCCA (squamous cell carcinoma) of skin 03/13/2005   Mid Upper Back   SCCA (squamous cell carcinoma) of skin 09/07/2005   Left Elbow(Cx3,5FU)   SCCA (squamous cell carcinoma) of skin 01/25/2006   Left Brow(in situ) (Cx3,5FU)   SCCA (squamous cell carcinoma) of skin 04/27/2006   Inner Left Shoulder(Keratoacanthoma) (Exc.)   SCCA (squamous cell carcinoma) of skin 03/22/2007   Left Temple(in situ) and Bridge of Nose(in situ) (Cs3,5FU)   SCCA (squamous cell carcinoma) of skin 05/12/2007   Top of Scalp(Cx3,5FU)   SCCA (squamous cell carcinoma) of skin 01/28/2011   Right Upper Arm(in situ)   SCCA (squamous cell carcinoma) of skin 07/26/2012   Glabella, Inferior Tip (Cx3,5FU)   SCCA (squamous cell carcinoma) of skin 03/08/2013   Left Front Scalp(in situ) and Upper Nose(in situ) (Cx3,5FU)   SCCA (squamous cell carcinoma) of skin 07/24/2013   Right Lower Leg(Keratoacanthoma)   SCCA (squamous cell carcinoma) of skin 09/11/2015   Left Scalp(in situ) (Cx3,5FU)   SCCA (squamous cell carcinoma) of skin 01/15/2016   Left Forearm(in situ) and Left Temple(in situ) (tx p bx)   SCCA (squamous cell carcinoma) of skin 10/01/2016   Left Mid Forearm(in situ)(tx p bx) and Left Front Scalp(watch)  SCCA (squamous cell carcinoma) of skin 11/12/2016   Left Temple(Keratoacanthoma) (watch)   SCCA (squamous cell carcinoma) of skin 02/11/2017   Top Left Hand(Keratoacanthoma) (tx p bx)   Squamous cell carcinoma in situ (SCCIS) 11/18/1999   Above Left Outer Eyebrow   Squamous cell carcinoma of scalp    removed - Dr. Jorja Loa   Superficial basal cell carcinoma (BCC) 07/14/2004   Left Scapula (Cx3,5FU)   Past Surgical History:  Procedure  Laterality Date   ABDOMINAL HYSTERECTOMY  1984   APPENDECTOMY  1941   Carotid Doppler  12/07/2012   R & L ICA - 0-49% diameter reduction   CORONARY ARTERY BYPASS GRAFT  09/1996   cath & CABG x5 LIMA-LAD, SVG-sequential OD & OM, SVG sequential to PDA & PLA (Dr. Donata Clay)   REPLACEMENT TOTAL KNEE Bilateral 2001 & 2003   TONSILLECTOMY     TRANSTHORACIC ECHOCARDIOGRAM  08/23/2013   EF 50-55%, LV cavity size mod reduced, mild LVH, mild conc hypertrophy; mild AV stenosis & mild regurg; mild MR - ordered for murmur    reports that she has never smoked. She has never used smokeless tobacco. She reports current alcohol use. No history on file for drug use. Social History   Socioeconomic History   Marital status: Widowed    Spouse name: Not on file   Number of children: 4   Years of education: 45   Highest education level: Not on file  Occupational History   Not on file  Tobacco Use   Smoking status: Never   Smokeless tobacco: Never  Vaping Use   Vaping status: Not on file  Substance and Sexual Activity   Alcohol use: Yes    Comment: "a drink of wine every now and then"   Drug use: Not on file   Sexual activity: Not on file  Other Topics Concern   Not on file  Social History Narrative   Not on file   Social Drivers of Health   Financial Resource Strain: Not on file  Food Insecurity: Not on file  Transportation Needs: Not on file  Physical Activity: Not on file  Stress: Not on file  Social Connections: Not on file  Intimate Partner Violence: Not on file    Functional Status Survey:    Family History  Problem Relation Age of Onset   Heart attack Mother    Heart attack Father    Cancer Sister    Diabetes Sister    Heart Problems Sister     Health Maintenance  Topic Date Due   FOOT EXAM  Never done   OPHTHALMOLOGY EXAM  Never done   Zoster Vaccines- Shingrix (1 of 2) Never done   Pneumonia Vaccine 10+ Years old (2 of 2 - PPSV23 or PCV20) 03/25/2013   COVID-19  Vaccine (3 - Mixed Product risk series) 10/14/2021   DTaP/Tdap/Td (1 - Tdap) 09/28/2022   HEMOGLOBIN A1C  07/05/2024   Medicare Annual Wellness (AWV)  01/24/2025   INFLUENZA VACCINE  Completed   DEXA SCAN  Completed   HPV VACCINES  Aged Out    Allergies  Allergen Reactions   Mobic [Meloxicam] Other (See Comments)    Unknown reaction   Novocain [Procaine] Other (See Comments)    Unknown reaction    Outpatient Encounter Medications as of 02/25/2024  Medication Sig   acetaminophen (TYLENOL) 325 MG tablet Take 650 mg by mouth every 4 (four) hours as needed.   acetaminophen (TYLENOL) 500 MG tablet Take 500 mg by  mouth at bedtime as needed (pain).   amLODipine (NORVASC) 5 MG tablet Take 5 mg by mouth daily.   Aspirin 81 MG CAPS Take 81 mg by mouth daily.   atorvastatin (LIPITOR) 40 MG tablet TAKE 1 TABLET BY MOUTH EVERY DAY   cholecalciferol (VITAMIN D3) 25 MCG (1000 UNIT) tablet Take 1,000 Units by mouth daily.   clopidogrel (PLAVIX) 75 MG tablet TAKE 1 TABLET BY MOUTH EVERY DAY   estradiol (ESTRACE) 0.1 MG/GM vaginal cream Place 1 Applicatorful vaginally See admin instructions. Apply 1g vaginally in the evening on Monday, Wednesday, Friday.   hydrocortisone 2.5 % cream Apply 1 Application topically at bedtime.   isosorbide mononitrate (IMDUR) 60 MG 24 hr tablet TAKE 1 TABLET BY MOUTH EVERY DAY   levothyroxine (SYNTHROID) 75 MCG tablet Take 75 mcg by mouth daily before breakfast.   metoprolol succinate (TOPROL-XL) 100 MG 24 hr tablet TAKE 1 TABLET BY MOUTH EVERY DAY WITH OR IMMEDIATELY FOLLOWING A MEAL   Multiple Vitamin (MULTIVITAMIN WITH MINERALS) TABS tablet Take 1 tablet by mouth daily.   mupirocin ointment (BACTROBAN) 2 % Apply 1 Application topically daily as needed (irritation).   nitroGLYCERIN (NITROSTAT) 0.4 MG SL tablet Place 0.4 mg under the tongue every 5 (five) minutes as needed for chest pain.   potassium chloride (KLOR-CON) 10 MEQ tablet Take 10 mEq by mouth daily as  needed.   Facility-Administered Encounter Medications as of 02/25/2024  Medication   triamcinolone acetonide (KENALOG-40) injection 40 mg    Review of Systems  Constitutional:  Negative for fever.  HENT:  Negative for sore throat.   Respiratory:  Negative for cough, shortness of breath and wheezing.   Cardiovascular:  Negative for chest pain, palpitations and leg swelling.  Gastrointestinal:  Negative for abdominal distention, abdominal pain, blood in stool, constipation, diarrhea, nausea and vomiting.  Genitourinary:  Negative for dysuria, frequency and urgency.  Musculoskeletal:  Positive for back pain.  Neurological:  Negative for dizziness.   Negative unless indicated in HPI.  There were no vitals filed for this visit. There is no height or weight on file to calculate BMI. BP Readings from Last 3 Encounters:  01/25/24 (!) 128/56  01/14/24 137/60  01/10/24 (!) 134/56   Wt Readings from Last 3 Encounters:  01/25/24 159 lb 12.8 oz (72.5 kg)  01/14/24 158 lb 3.2 oz (71.8 kg)  01/10/24 158 lb (71.7 kg)   Physical Exam Constitutional:      Appearance: Normal appearance.  HENT:     Head: Normocephalic and atraumatic.  Cardiovascular:     Rate and Rhythm: Normal rate and regular rhythm.     Heart sounds: Murmur heard.  Pulmonary:     Effort: Pulmonary effort is normal. No respiratory distress.     Breath sounds: Normal breath sounds. No wheezing.  Abdominal:     General: Bowel sounds are normal. There is no distension.     Tenderness: There is no abdominal tenderness. There is no guarding or rebound.     Comments:    Musculoskeletal:        General: No swelling.  Neurological:     Mental Status: She is alert. Mental status is at baseline.     Motor: No weakness.     Comments: Strength intact     Labs reviewed: Basic Metabolic Panel: Recent Labs    07/07/23 2302 08/10/23 1548 08/11/23 0927 10/05/23 0600 01/06/24 0000  NA 137 132* 133* 138 141  K 4.0 2.8* 3.5  4.2 4.6  CL 100 101 103 104 106  CO2 24 23 20* 28 24*  GLUCOSE 121* 140* 115* 90  --   BUN 15 8 7* 14 24*  CREATININE 0.93 0.82 0.79 0.87 0.9  CALCIUM 8.7* 8.1* 8.0* 8.6 9.1  MG 1.8 1.7  --   --   --    Liver Function Tests: Recent Labs    08/10/23 1548 01/06/24 0000  AST 23 55*  ALT 14 53*  ALKPHOS 108 274*  BILITOT 0.9  --   PROT 6.7  --   ALBUMIN 2.8* 3.3*   No results for input(s): "LIPASE", "AMYLASE" in the last 8760 hours. No results for input(s): "AMMONIA" in the last 8760 hours. CBC: Recent Labs    07/07/23 2302 08/10/23 1548 01/06/24 0000  WBC 7.7 6.0 7.3  NEUTROABS 4.2 4.0  --   HGB 10.4* 10.1* 9.2*  HCT 32.7* 31.1* 28*  MCV 104.8* 100.0  --   PLT 203 196 227   Cardiac Enzymes: No results for input(s): "CKTOTAL", "CKMB", "CKMBINDEX", "TROPONINI" in the last 8760 hours. BNP: Invalid input(s): "POCBNP" Lab Results  Component Value Date   HGBA1C 6.5 01/06/2024   Lab Results  Component Value Date   TSH 2.78 01/06/2024   Lab Results  Component Value Date   VITAMINB12 320 09/13/2013   No results found for: "FOLATE" No results found for: "IRON", "TIBC", "FERRITIN"  Imaging and Procedures obtained prior to SNF admission: CT Head Wo Contrast Result Date: 12/07/2023 CLINICAL DATA:  Head trauma, minor (Age >= 65y) Repeat CT at 4 hours to further assess poorly visualized artifact versus bleed EXAM: CT HEAD WITHOUT CONTRAST TECHNIQUE: Contiguous axial images were obtained from the base of the skull through the vertex without intravenous contrast. RADIATION DOSE REDUCTION: This exam was performed according to the departmental dose-optimization program which includes automated exposure control, adjustment of the mA and/or kV according to patient size and/or use of iterative reconstruction technique. COMPARISON:  CT head 12/07/23 FINDINGS: Brain: There is likely a persistent small extra-axial collection along the left frontal convexity which predominantly appears  low-density. This is unchanged in size compared to recent prior head CT and most likely represents either a subdural hygroma for a chronic subdural hematoma. There is no midline shift. No mass lesion. No CT evidence of acute cortical infarct. No hydrocephalus Vascular: No hyperdense vessel or unexpected calcification. Skull: Normal. Negative for fracture or focal lesion. Sinuses/Orbits: No middle ear or mastoid effusion. Paranasal sinuses are clear. Orbits are unremarkable. Other: None. IMPRESSION: Persistent small extra-axial collection along the left frontal convexity which predominantly appears low-density. This is unchanged in size compared to recent prior head CT and most likely represents either a subdural hygroma or a chronic subdural hematoma. No midline shift. Electronically Signed   By: Lorenza Cambridge M.D.   On: 12/07/2023 14:11   DG Pelvis Portable Result Date: 12/07/2023 CLINICAL DATA:  Status post fall EXAM: PORTABLE PELVIS 1 VIEWS COMPARISON:  None Available. FINDINGS: There is no evidence of pelvic fracture or diastasis. No pelvic bone lesions are seen. Degenerative changes of the bilateral hips. IMPRESSION: No radiographic finding of acute displaced fracture. Electronically Signed   By: Agustin Cree M.D.   On: 12/07/2023 09:25   CT Head Wo Contrast Result Date: 12/07/2023 CLINICAL DATA:  Head trauma, minor (Age >= 65y); Neck trauma (Age >= 65y). Fall on blood thinners. EXAM: CT HEAD WITHOUT CONTRAST CT CERVICAL SPINE WITHOUT CONTRAST TECHNIQUE: Multidetector CT imaging of the head and cervical spine  was performed following the standard protocol without intravenous contrast. Multiplanar CT image reconstructions of the cervical spine were also generated. RADIATION DOSE REDUCTION: This exam was performed according to the departmental dose-optimization program which includes automated exposure control, adjustment of the mA and/or kV according to patient size and/or use of iterative reconstruction  technique. COMPARISON:  Head CT 02/29/2004. FINDINGS: CT HEAD FINDINGS Brain: Hazy density underlying the calvarium along the left-greater-than-right bilateral cerebral convexities is favored to reflect cupping artifact given lack of vessel displacement. Cortical gray-white differentiation is preserved. Asymmetric volume loss of the left cerebral hemisphere with frontotemporal predominance. No acute hydrocephalus or midline shift. Vascular: No hyperdense vessel or unexpected calcification. Skull: No calvarial fracture or suspicious bone lesion. Skull base is unremarkable. Sinuses/Orbits: No acute finding. Other: Small right parietal scalp hematoma. CT CERVICAL SPINE FINDINGS Alignment: 3 mm degenerative, stair step anterolisthesis from C5-T1. Skull base and vertebrae: No acute fracture. Severe degenerative changes of the craniocervical junction with moderate degenerative pannus resulting in at least mild spinal canal stenosis at C1-2. No suspicious bone lesions. Soft tissues and spinal canal: No prevertebral fluid or swelling. No visible canal hematoma. Disc levels: Multilevel cervical spondylosis with at least moderate spinal canal stenosis at C5-6. Upper chest: No acute findings. Other: Atherosclerotic calcifications of the carotid bulbs. IMPRESSION: 1. Hazy density underlying the calvarium along the left-greater-than-right bilateral cerebral convexities is favored to reflect cupping artifact given lack of vessel displacement. Consider follow-up head CT in 4-6 hours versus noncontrast brain MRI to exclude subdural hemorrhage. 2. Small right parietal scalp hematoma. 3. No acute cervical spine fracture. 4. Multilevel cervical spondylosis with at least moderate spinal canal stenosis at C5-6. These results were called by telephone at the time of interpretation on 12/07/2023 at 8:51 am to provider [facility] , who verbally acknowledged these results. Electronically Signed   By: Orvan Falconer M.D.   On:  12/07/2023 08:57   CT Cervical Spine Wo Contrast Result Date: 12/07/2023 CLINICAL DATA:  Head trauma, minor (Age >= 65y); Neck trauma (Age >= 65y). Fall on blood thinners. EXAM: CT HEAD WITHOUT CONTRAST CT CERVICAL SPINE WITHOUT CONTRAST TECHNIQUE: Multidetector CT imaging of the head and cervical spine was performed following the standard protocol without intravenous contrast. Multiplanar CT image reconstructions of the cervical spine were also generated. RADIATION DOSE REDUCTION: This exam was performed according to the departmental dose-optimization program which includes automated exposure control, adjustment of the mA and/or kV according to patient size and/or use of iterative reconstruction technique. COMPARISON:  Head CT 02/29/2004. FINDINGS: CT HEAD FINDINGS Brain: Hazy density underlying the calvarium along the left-greater-than-right bilateral cerebral convexities is favored to reflect cupping artifact given lack of vessel displacement. Cortical gray-white differentiation is preserved. Asymmetric volume loss of the left cerebral hemisphere with frontotemporal predominance. No acute hydrocephalus or midline shift. Vascular: No hyperdense vessel or unexpected calcification. Skull: No calvarial fracture or suspicious bone lesion. Skull base is unremarkable. Sinuses/Orbits: No acute finding. Other: Small right parietal scalp hematoma. CT CERVICAL SPINE FINDINGS Alignment: 3 mm degenerative, stair step anterolisthesis from C5-T1. Skull base and vertebrae: No acute fracture. Severe degenerative changes of the craniocervical junction with moderate degenerative pannus resulting in at least mild spinal canal stenosis at C1-2. No suspicious bone lesions. Soft tissues and spinal canal: No prevertebral fluid or swelling. No visible canal hematoma. Disc levels: Multilevel cervical spondylosis with at least moderate spinal canal stenosis at C5-6. Upper chest: No acute findings. Other: Atherosclerotic calcifications  of the carotid bulbs. IMPRESSION:  1. Hazy density underlying the calvarium along the left-greater-than-right bilateral cerebral convexities is favored to reflect cupping artifact given lack of vessel displacement. Consider follow-up head CT in 4-6 hours versus noncontrast brain MRI to exclude subdural hemorrhage. 2. Small right parietal scalp hematoma. 3. No acute cervical spine fracture. 4. Multilevel cervical spondylosis with at least moderate spinal canal stenosis at C5-6. These results were called by telephone at the time of interpretation on 12/07/2023 at 8:51 am to provider [facility] , who verbally acknowledged these results. Electronically Signed   By: Orvan Falconer M.D.   On: 12/07/2023 08:57   DG Chest Portable 1 View Result Date: 12/07/2023 CLINICAL DATA:  Status post fall EXAM: PORTABLE CHEST 1 VIEW COMPARISON:  Chest radiograph dated 08/10/2023 FINDINGS: Asymmetric elevation of the right hemidiaphragm with asymmetrically lower right lung volumes. Bibasilar patchy and linear opacities. Questionable trace bilateral pleural effusions. No pneumothorax. Similar postsurgical cardiomediastinal silhouette. Median sternotomy wires are nondisplaced. Unchanged fracture of the inferior most wire. Degenerative changes of the right shoulder. No radiographic finding of acute displaced fracture. IMPRESSION: 1. Bibasilar patchy and linear opacities, likely atelectasis. 2.  No radiographic finding of acute displaced fracture. 3. Questionable trace bilateral pleural effusions. Electronically Signed   By: Agustin Cree M.D.   On: 12/07/2023 08:52    Assessment and Plan      1. Recurrent falls (Primary) Pt had 2 falls this week Pt reports her legs giving out  Strength 5/5 in her lower legs Will order PT/OT Fall precautions  2. Chronic bilateral low back pain without sciatica Chronic  No new change in pain level as per patient  Cont with norco  3. Elevated liver enzymes Pt reports poor  appetite    Latest Ref Rng & Units 01/06/2024   12:00 AM 10/05/2023    6:00 AM 08/11/2023    9:27 AM  CMP  Glucose 65 - 99 mg/dL  90  413   BUN 4 - 21 24     14  7    Creatinine 0.5 - 1.1 0.9     0.87  0.79   Sodium 137 - 147 141     138  133   Potassium 3.5 - 5.1 mEq/L 4.6     4.2  3.5   Chloride 99 - 108 106     104  103   CO2 13 - 22 24     28  20    Calcium 8.7 - 10.7 9.1     8.6  8.0   Alkaline Phos 25 - 125 274        AST 13 - 35 55        ALT 7 - 35 U/L 53           This result is from an external source.    Will repeat cmp, cbc  HTN  Bp at goal Cont with amlodipine, metoprolol    30 minTotal time spent for obtaining history,  performing a medically appropriate examination and evaluation, reviewing the tests,  documenting clinical information in the electronic or other health record, ,care coordination (not separately reported)

## 2024-02-27 ENCOUNTER — Emergency Department (HOSPITAL_COMMUNITY)

## 2024-02-27 ENCOUNTER — Emergency Department (HOSPITAL_COMMUNITY)
Admission: EM | Admit: 2024-02-27 | Discharge: 2024-02-27 | Disposition: A | Discharge status: Home / Self Care | Attending: Emergency Medicine | Admitting: Emergency Medicine

## 2024-02-27 ENCOUNTER — Encounter (HOSPITAL_COMMUNITY): Payer: Self-pay

## 2024-02-27 ENCOUNTER — Other Ambulatory Visit: Payer: Self-pay

## 2024-02-27 DIAGNOSIS — S22000A Wedge compression fracture of unspecified thoracic vertebra, initial encounter for closed fracture: Secondary | ICD-10-CM

## 2024-02-27 DIAGNOSIS — I2510 Atherosclerotic heart disease of native coronary artery without angina pectoris: Secondary | ICD-10-CM | POA: Diagnosis not present

## 2024-02-27 DIAGNOSIS — S0990XA Unspecified injury of head, initial encounter: Secondary | ICD-10-CM | POA: Insufficient documentation

## 2024-02-27 DIAGNOSIS — M25551 Pain in right hip: Secondary | ICD-10-CM | POA: Diagnosis not present

## 2024-02-27 DIAGNOSIS — I700 Atherosclerosis of aorta: Secondary | ICD-10-CM | POA: Diagnosis not present

## 2024-02-27 DIAGNOSIS — S199XXA Unspecified injury of neck, initial encounter: Secondary | ICD-10-CM | POA: Diagnosis not present

## 2024-02-27 DIAGNOSIS — J9811 Atelectasis: Secondary | ICD-10-CM | POA: Diagnosis not present

## 2024-02-27 DIAGNOSIS — S29002A Unspecified injury of muscle and tendon of back wall of thorax, initial encounter: Secondary | ICD-10-CM | POA: Diagnosis present

## 2024-02-27 DIAGNOSIS — I10 Essential (primary) hypertension: Secondary | ICD-10-CM | POA: Insufficient documentation

## 2024-02-27 DIAGNOSIS — M79605 Pain in left leg: Secondary | ICD-10-CM | POA: Diagnosis not present

## 2024-02-27 DIAGNOSIS — M25572 Pain in left ankle and joints of left foot: Secondary | ICD-10-CM | POA: Diagnosis not present

## 2024-02-27 DIAGNOSIS — E039 Hypothyroidism, unspecified: Secondary | ICD-10-CM | POA: Insufficient documentation

## 2024-02-27 DIAGNOSIS — Z85828 Personal history of other malignant neoplasm of skin: Secondary | ICD-10-CM | POA: Insufficient documentation

## 2024-02-27 DIAGNOSIS — W19XXXA Unspecified fall, initial encounter: Secondary | ICD-10-CM | POA: Insufficient documentation

## 2024-02-27 DIAGNOSIS — S0083XA Contusion of other part of head, initial encounter: Secondary | ICD-10-CM

## 2024-02-27 DIAGNOSIS — Z79899 Other long term (current) drug therapy: Secondary | ICD-10-CM | POA: Diagnosis not present

## 2024-02-27 DIAGNOSIS — Z7982 Long term (current) use of aspirin: Secondary | ICD-10-CM | POA: Diagnosis not present

## 2024-02-27 DIAGNOSIS — S3993XA Unspecified injury of pelvis, initial encounter: Secondary | ICD-10-CM | POA: Diagnosis not present

## 2024-02-27 DIAGNOSIS — Z7401 Bed confinement status: Secondary | ICD-10-CM | POA: Diagnosis not present

## 2024-02-27 DIAGNOSIS — S299XXA Unspecified injury of thorax, initial encounter: Secondary | ICD-10-CM | POA: Diagnosis not present

## 2024-02-27 DIAGNOSIS — I959 Hypotension, unspecified: Secondary | ICD-10-CM | POA: Diagnosis not present

## 2024-02-27 DIAGNOSIS — R9389 Abnormal findings on diagnostic imaging of other specified body structures: Secondary | ICD-10-CM | POA: Diagnosis not present

## 2024-02-27 DIAGNOSIS — S22039A Unspecified fracture of third thoracic vertebra, initial encounter for closed fracture: Secondary | ICD-10-CM | POA: Diagnosis not present

## 2024-02-27 DIAGNOSIS — S065X0A Traumatic subdural hemorrhage without loss of consciousness, initial encounter: Secondary | ICD-10-CM | POA: Diagnosis not present

## 2024-02-27 DIAGNOSIS — G9600 Cerebrospinal fluid leak, unspecified: Secondary | ICD-10-CM | POA: Diagnosis not present

## 2024-02-27 DIAGNOSIS — M47816 Spondylosis without myelopathy or radiculopathy, lumbar region: Secondary | ICD-10-CM | POA: Diagnosis not present

## 2024-02-27 DIAGNOSIS — Z96652 Presence of left artificial knee joint: Secondary | ICD-10-CM | POA: Diagnosis not present

## 2024-02-27 NOTE — ED Notes (Addendum)
 Reviewed AVS with SNF RN Tally Joe at [facility] [place]. Informed of imaging performed and discharge instructions. RN Ashlynn verbalized concern for left leg pain and frequent falls. RN asking if imaging was preformed and labs. EDP Palumbo was informed of SNF RN concerns. New orders placed.

## 2024-02-27 NOTE — ED Provider Notes (Signed)
 [place] EMERGENCY DEPARTMENT AT [facility] Provider Note   CSN: [id number] Arrival date & time: 02/27/24  0146     History  Chief Complaint  Patient presents with   Kathryn Carlson    Kathryn Carlson is a 89 y.o. female.  The history is provided by the EMS personnel.  Fall This is a new problem. The current episode started less than 1 hour ago. The problem occurs constantly. The problem has not changed since onset.Pertinent negatives include no chest pain, no abdominal pain and no shortness of breath. She has tried nothing for the symptoms. The treatment provided no relief.      Past Medical History:  Diagnosis Date   BCC (basal cell carcinoma of skin) 01/09/2009   Lower Central Back (tx p bx)   CAD (coronary artery disease)    possible ant wall MI ('97) - cath & CABG x5   Exogenous obesity    GERD (gastroesophageal reflux disease)    History of nuclear stress test 09/2012   lexiscan; mild perfusion defect in apical anterior & apical region (infarct/scar) - no significant ischemia, low risk    History of total bilateral knee replacement    Hypertension    Hypothyroidism    Nodular basal cell carcinoma (BCC) 08/17/2017   Left Calf (tx p bx)   PVC's (premature ventricular contractions)    SCC (squamous cell carcinoma) [place] 01/29/2004   Right Forearm (Cx3,5FU)   SCCA (squamous cell carcinoma) of skin 03/13/2005   Mid Upper Back   SCCA (squamous cell carcinoma) of skin 09/07/2005   Left Elbow(Cx3,5FU)   SCCA (squamous cell carcinoma) of skin 01/25/2006   Left Brow(in situ) (Cx3,5FU)   SCCA (squamous cell carcinoma) of skin 04/27/2006   Inner Left Shoulder(Keratoacanthoma) (Exc.)   SCCA (squamous cell carcinoma) of skin 03/22/2007   Left Temple(in situ) and Bridge of Nose(in situ) (Cs3,5FU)   SCCA (squamous cell carcinoma) of skin 05/12/2007   Top of Scalp(Cx3,5FU)   SCCA (squamous cell carcinoma) of skin 01/28/2011   Right Upper Arm(in situ)   SCCA (squamous  cell carcinoma) of skin 07/26/2012   Glabella, Inferior Tip (Cx3,5FU)   SCCA (squamous cell carcinoma) of skin 03/08/2013   Left Front Scalp(in situ) and Upper Nose(in situ) (Cx3,5FU)   SCCA (squamous cell carcinoma) of skin 07/24/2013   Right Lower Leg(Keratoacanthoma)   SCCA (squamous cell carcinoma) of skin 09/11/2015   Left Scalp(in situ) (Cx3,5FU)   SCCA (squamous cell carcinoma) of skin 01/15/2016   Left Forearm(in situ) and Left Temple(in situ) (tx p bx)   SCCA (squamous cell carcinoma) of skin 10/01/2016   Left Mid Forearm(in situ)(tx p bx) and Left Front Scalp(watch)   SCCA (squamous cell carcinoma) of skin 11/12/2016   Left Temple(Keratoacanthoma) (watch)   SCCA (squamous cell carcinoma) of skin 02/11/2017   Top Left Hand(Keratoacanthoma) (tx p bx)   Squamous cell carcinoma in situ (SCCIS) 11/18/1999   Above Left Outer Eyebrow   Squamous cell carcinoma of scalp    removed - Dr. Jorja Loa   Superficial basal cell carcinoma (BCC) 07/14/2004   Left Scapula (Cx3,5FU)     Home Medications Prior to Admission medications   Medication Sig Start Date End Date Taking? Authorizing Provider  acetaminophen (TYLENOL) 325 MG tablet Take 650 mg by mouth every 4 (four) hours as needed.    [provider]  acetaminophen (TYLENOL) 500 MG tablet Take 500 mg by mouth at bedtime as needed (pain).    [provider]  amLODipine (NORVASC) 5 MG tablet Take 5 mg by mouth daily.    [provider]  Aspirin 81 MG CAPS Take 81 mg by mouth daily.    [provider]  atorvastatin (LIPITOR) 40 MG tablet TAKE 1 TABLET BY MOUTH EVERY DAY 09/21/23   Runell Gess, MD  cholecalciferol (VITAMIN D3) 25 MCG (1000 UNIT) tablet Take 1,000 Units by mouth daily.    [provider]  clopidogrel (PLAVIX) 75 MG tablet TAKE 1 TABLET BY MOUTH EVERY DAY 06/24/23   Runell Gess, MD  estradiol (ESTRACE) 0.1 MG/GM vaginal cream Place 1 Applicatorful vaginally See admin  instructions. Apply 1g vaginally in the evening on Monday, Wednesday, Friday.    [provider]  HYDROcodone-acetaminophen (NORCO/VICODIN) 5-325 MG tablet Take 0.5 tablets by mouth at bedtime. 02/03/24   [provider]  hydrocortisone 2.5 % cream Apply 1 Application topically at bedtime.    [provider]  isosorbide mononitrate (IMDUR) 60 MG 24 hr tablet TAKE 1 TABLET BY MOUTH EVERY DAY 06/28/23   Runell Gess, MD  levothyroxine (SYNTHROID) 75 MCG tablet Take 75 mcg by mouth daily before breakfast.    [provider]  metoprolol succinate (TOPROL-XL) 100 MG 24 hr tablet TAKE 1 TABLET BY MOUTH EVERY DAY WITH OR IMMEDIATELY FOLLOWING A MEAL 09/21/23   Runell Gess, MD  Multiple Vitamin (MULTIVITAMIN WITH MINERALS) TABS tablet Take 1 tablet by mouth daily.    [provider]  mupirocin ointment (BACTROBAN) 2 % Apply 1 Application topically daily as needed (irritation).    [provider]  nitroGLYCERIN (NITROSTAT) 0.4 MG SL tablet Place 0.4 mg under the tongue every 5 (five) minutes as needed for chest pain. 08/12/23   [provider]  potassium chloride (KLOR-CON) 10 MEQ tablet Take 10 mEq by mouth daily as needed.    [provider]      Allergies    Mobic [meloxicam] and Novocain [procaine]    Review of Systems   Review of Systems  Unable to perform ROS: Acuity of condition  Constitutional:  Negative for fever.  Respiratory:  Negative for shortness of breath.   Cardiovascular:  Negative for chest pain.  Gastrointestinal:  Negative for abdominal pain.  Musculoskeletal:  Positive for arthralgias. Negative for back pain and neck pain.    Physical Exam Updated Vital Signs BP 127/67 (BP Location: Right Arm)   Pulse 70   Temp (!) 97.4 F (36.3 C) (Oral)   Resp 18   Ht 5\' 6"  (1.676 m)   Wt 72.6 kg   SpO2 100%   BMI 25.82 kg/m  Physical Exam Vitals and nursing note reviewed. Exam conducted with a chaperone  present.  Constitutional:      General: She is not in acute distress.    Appearance: She is well-developed.  HENT:     Head: Normocephalic and atraumatic.     Right Ear: Tympanic membrane normal.     Left Ear: Tympanic membrane normal.     Nose: Nose normal.     Mouth/Throat:     Mouth: Mucous membranes are moist.     Pharynx: Oropharynx is clear.  Eyes:     Extraocular Movements: Extraocular movements intact.     Pupils: Pupils are equal, round, and reactive to light.  Cardiovascular:     Rate and Rhythm: Normal rate and regular rhythm.     Pulses: Normal pulses.     Heart sounds: Normal heart sounds.  Pulmonary:  Effort: Pulmonary effort is normal. No respiratory distress.     Breath sounds: Normal breath sounds.  Abdominal:     General: Bowel sounds are normal. There is no distension.     Palpations: Abdomen is soft.     Tenderness: There is no abdominal tenderness. There is no guarding or rebound.  Musculoskeletal:        General: Normal range of motion.     Right wrist: Normal. No snuff box tenderness.     Left wrist: Normal. No snuff box tenderness.     Right hand: Normal.     Left hand: Normal.     Cervical back: Normal, normal range of motion and neck supple. No tenderness.     Thoracic back: Normal.     Lumbar back: Normal.     Right hip: Normal.     Left hip: Normal.     Right lower leg: No edema.     Left lower leg: No edema.     Right ankle: Normal.     Right Achilles Tendon: Normal.     Left ankle: Normal.     Left Achilles Tendon: Normal.     Right foot: Normal. Normal range of motion and normal capillary refill. No swelling, deformity, tenderness or bony tenderness.     Left foot: Normal range of motion and normal capillary refill. No swelling, deformity, tenderness or bony tenderness.  Skin:    General: Skin is warm and dry.     Capillary Refill: Capillary refill takes less than 2 seconds.     Findings: No erythema or rash.  Neurological:      General: No focal deficit present.     Deep Tendon Reflexes: Reflexes normal.  Psychiatric:        Mood and Affect: Mood normal.     ED Results / Procedures / Treatments   Labs (all labs ordered are listed, but only abnormal results are displayed) Labs Reviewed - No data to display  EKG None  Radiology CT Head Wo Contrast Result Date: 02/27/2024 CLINICAL DATA:  Level 2 fall, on blood thinners, hematoma to left forehead EXAM: CT HEAD WITHOUT CONTRAST CT CERVICAL SPINE WITHOUT CONTRAST TECHNIQUE: Multidetector CT imaging of the head and cervical spine was performed following the standard protocol without intravenous contrast. Multiplanar CT image reconstructions of the cervical spine were also generated. RADIATION DOSE REDUCTION: This exam was performed according to the departmental dose-optimization program which includes automated exposure control, adjustment of the mA and/or kV according to patient size and/or use of iterative reconstruction technique. COMPARISON:  CT head and C-spine 12/07/2023 FINDINGS: CT HEAD FINDINGS Brain: No intracranial hemorrhage, mass effect, or evidence of acute infarct. No hydrocephalus. 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 Age-commensurate cerebral atrophy and chronic small vessel ischemic disease. Vascular: No hyperdense vessel. Intracranial arterial calcification. Skull: No fracture or focal lesion.  Left forehead hematoma. Sinuses/Orbits: No acute finding. Other: None. CT CERVICAL SPINE FINDINGS Alignment: No evidence of traumatic malalignment. Chronic subluxations of multiple vertebral bodies are unchanged. Skull base and vertebrae: No acute fracture in the cervical spine. Age indeterminate superior endplate compression fracture of T3, new since 12/07/2023. There is less than 10% vertebral body height loss. No retropulsion. Sclerosis in the superior endplate of T3 is new compared to 12/07/2023. Soft tissues and  spinal canal: No prevertebral fluid or swelling. No visible canal hematoma. Disc levels: Degenerative pannus formation about the atlantoaxial joint is unchanged. Multilevel spondylosis and facet arthropathy is  unchanged. No severe spinal canal narrowing. Upper chest: No acute abnormality. Other: Carotid calcification. IMPRESSION: 1. No acute intracranial abnormality. Left forehead hematoma. 2. No acute fracture in the cervical spine. 3. Age indeterminate superior endplate compression fracture of T3. There is less than 10% vertebral body height loss. No retropulsion. 4. 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 Electronically Signed   By: Minerva Fester M.D.   On: 02/27/2024 02:32   CT Cervical Spine Wo Contrast Result Date: 02/27/2024 CLINICAL DATA:  Level 2 fall, on blood thinners, hematoma to left forehead EXAM: CT HEAD WITHOUT CONTRAST CT CERVICAL SPINE WITHOUT CONTRAST TECHNIQUE: Multidetector CT imaging of the head and cervical spine was performed following the standard protocol without intravenous contrast. Multiplanar CT image reconstructions of the cervical spine were also generated. RADIATION DOSE REDUCTION: This exam was performed according to the departmental dose-optimization program which includes automated exposure control, adjustment of the mA and/or kV according to patient size and/or use of iterative reconstruction technique. COMPARISON:  CT head and C-spine 12/07/2023 FINDINGS: CT HEAD FINDINGS Brain: No intracranial hemorrhage, mass effect, or evidence of acute infarct. No hydrocephalus. 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 Age-commensurate cerebral atrophy and chronic small vessel ischemic disease. Vascular: No hyperdense vessel. Intracranial arterial calcification. Skull: No fracture or focal lesion.  Left forehead hematoma. Sinuses/Orbits: No acute finding. Other: None. CT CERVICAL SPINE  FINDINGS Alignment: No evidence of traumatic malalignment. Chronic subluxations of multiple vertebral bodies are unchanged. Skull base and vertebrae: No acute fracture in the cervical spine. Age indeterminate superior endplate compression fracture of T3, new since 12/07/2023. There is less than 10% vertebral body height loss. No retropulsion. Sclerosis in the superior endplate of T3 is new compared to 12/07/2023. Soft tissues and spinal canal: No prevertebral fluid or swelling. No visible canal hematoma. Disc levels: Degenerative pannus formation about the atlantoaxial joint is unchanged. Multilevel spondylosis and facet arthropathy is unchanged. No severe spinal canal narrowing. Upper chest: No acute abnormality. Other: Carotid calcification. IMPRESSION: 1. No acute intracranial abnormality. Left forehead hematoma. 2. No acute fracture in the cervical spine. 3. Age indeterminate superior endplate compression fracture of T3. There is less than 10% vertebral body height loss. No retropulsion. 4. 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 Electronically Signed   By: Minerva Fester M.D.   On: 02/27/2024 02:32   DG Pelvis Portable Result Date: 02/27/2024 CLINICAL DATA:  Level 2 trauma, fall, on blood thinners EXAM: PORTABLE PELVIS 1-2 VIEWS COMPARISON:  12/07/2023 FINDINGS: No acute fracture or dislocation. Degenerative changes pubic symphysis, both hips, SI joints and lower lumbar spine. IMPRESSION: No acute fracture or dislocation. Electronically Signed   By: Minerva Fester M.D.   On: 02/27/2024 02:04   DG Chest Portable 1 View Result Date: 02/27/2024 CLINICAL DATA:  Level 2 trauma, fall on blood thinners EXAM: PORTABLE CHEST 1 VIEW COMPARISON:  12/07/2023 FINDINGS: Sternotomy and CABG. Stable cardiomediastinal silhouette. Aortic atherosclerotic calcification. Elevated right hemidiaphragm and right basilar atelectasis. Additional left basilar atelectasis. Otherwise  no focal consolidation. No pleural effusion or pneumothorax. No displaced rib fractures. IMPRESSION: No change from 12/07/2023. Bibasilar atelectasis. Elevated right hemidiaphragm. Electronically Signed   By: Minerva Fester M.D.   On: 02/27/2024 02:03    Procedures Procedures    Medications Ordered in ED Medications - No data to display  ED Course/ Medical Decision Making/ A&P  Medical Decision Making Patient presents as a level 2 fall on thinners.    Amount and/or Complexity of Data Reviewed Independent Historian: EMS    Details: See above  External Data Reviewed: notes.    Details: Previous notes reviewed  Radiology: ordered and independent interpretation performed.    Details: NACPD by me on CXR, normal hips on XR  Risk Risk Details: Very well appearing.  No trauma.  Stable for discharge with close follow up.  Strict returns     Final Clinical Impression(s) / ED Diagnoses Final diagnoses:  Compression fracture of body of thoracic vertebra (HCC)  Fall, initial encounter   The patient is nontoxic-appearing on exam and vital signs are within normal limits.  I have reviewed the triage vital signs and the nursing notes. Pertinent labs & imaging results that were available during my care of the patient were reviewed by me and considered in my medical decision making (see chart for details). After history, exam, and medical workup I feel the patient has been appropriately medically screened and is safe for discharge home. Pertinent diagnoses were discussed with the patient. Patient was given return precautions.  Rx / DC Orders ED Discharge Orders     None         Yaiza Palazzola, MD 02/27/24 [id number]

## 2024-02-27 NOTE — ED Notes (Addendum)
 Trauma Response Nurse Documentation   Kathryn Carlson is a 89 y.o. female arriving to [facility] ED via EMS  On clopidogrel 75 mg daily. Trauma was activated as a Level 2 by ED charge RN based on the following trauma criteria Elderly patients > 65 with head trauma on anti-coagulation (excluding ASA).  Patient cleared for CT by Dr. Nicanor Alcon EDP. Pt transported to CT with trauma response nurse present to monitor. RN remained with the patient throughout their absence from the department for clinical observation.   GCS 14.   History   Past Medical History:  Diagnosis Date   BCC (basal cell carcinoma of skin) 01/09/2009   Lower Central Back (tx p bx)   CAD (coronary artery disease)    possible ant wall MI ('97) - cath & CABG x5   Exogenous obesity    GERD (gastroesophageal reflux disease)    History of nuclear stress test 09/2012   lexiscan; mild perfusion defect in apical anterior & apical region (infarct/scar) - no significant ischemia, low risk    History of total bilateral knee replacement    Hypertension    Hypothyroidism    Nodular basal cell carcinoma (BCC) 08/17/2017   Left Calf (tx p bx)   PVC's (premature ventricular contractions)    SCC (squamous cell carcinoma) [place] 01/29/2004   Right Forearm (Cx3,5FU)   SCCA (squamous cell carcinoma) of skin 03/13/2005   Mid Upper Back   SCCA (squamous cell carcinoma) of skin 09/07/2005   Left Elbow(Cx3,5FU)   SCCA (squamous cell carcinoma) of skin 01/25/2006   Left Brow(in situ) (Cx3,5FU)   SCCA (squamous cell carcinoma) of skin 04/27/2006   Inner Left Shoulder(Keratoacanthoma) (Exc.)   SCCA (squamous cell carcinoma) of skin 03/22/2007   Left Temple(in situ) and Bridge of Nose(in situ) (Cs3,5FU)   SCCA (squamous cell carcinoma) of skin 05/12/2007   Top of Scalp(Cx3,5FU)   SCCA (squamous cell carcinoma) of skin 01/28/2011   Right Upper Arm(in situ)   SCCA (squamous cell carcinoma) of skin 07/26/2012   Glabella, Inferior Tip  (Cx3,5FU)   SCCA (squamous cell carcinoma) of skin 03/08/2013   Left Front Scalp(in situ) and Upper Nose(in situ) (Cx3,5FU)   SCCA (squamous cell carcinoma) of skin 07/24/2013   Right Lower Leg(Keratoacanthoma)   SCCA (squamous cell carcinoma) of skin 09/11/2015   Left Scalp(in situ) (Cx3,5FU)   SCCA (squamous cell carcinoma) of skin 01/15/2016   Left Forearm(in situ) and Left Temple(in situ) (tx p bx)   SCCA (squamous cell carcinoma) of skin 10/01/2016   Left Mid Forearm(in situ)(tx p bx) and Left Front Scalp(watch)   SCCA (squamous cell carcinoma) of skin 11/12/2016   Left Temple(Keratoacanthoma) (watch)   SCCA (squamous cell carcinoma) of skin 02/11/2017   Top Left Hand(Keratoacanthoma) (tx p bx)   Squamous cell carcinoma in situ (SCCIS) 11/18/1999   Above Left Outer Eyebrow   Squamous cell carcinoma of scalp    removed - Dr. Jorja Loa   Superficial basal cell carcinoma (BCC) 07/14/2004   Left Scapula (Cx3,5FU)     Past Surgical History:  Procedure Laterality Date   ABDOMINAL HYSTERECTOMY  1984   APPENDECTOMY  1941   Carotid Doppler  12/07/2012   R & L ICA - 0-49% diameter reduction   CORONARY ARTERY BYPASS GRAFT  09/1996   cath & CABG x5 LIMA-LAD, SVG-sequential OD & OM, SVG sequential to PDA & PLA (Dr. Donata Clay)   REPLACEMENT TOTAL KNEE Bilateral 2001 & 2003   TONSILLECTOMY  TRANSTHORACIC ECHOCARDIOGRAM  08/23/2013   EF 50-55%, LV cavity size mod reduced, mild LVH, mild conc hypertrophy; mild AV stenosis & mild regurg; mild MR - ordered for murmur       Initial Focused Assessment (If applicable, or please see trauma documentation): Alert/confused female presents via EMS from SNF c/o multiple falls over the last week, presents with bruising to left forehead and extremities at various stages of healing.   Airway patent, BS clear No obvious uncontrolled hemorrhage GCS 14 PERRLA 3  CT's Completed:   CT Head and CT C-Spine   Interventions:  Trauma lab draw Portable  chest and pelvis CT head and cervical spine  Plan for disposition:  Discharge  Consults completed:  none  Event Summary: Pt presents via EMS from SNF c/o fall, reports multiple falls over the last week. C/o left lower leg pain and back pain. Bruising to left forehead and extremities with various stages of healing.  MTP Summary (If applicable): NA  Bedside handoff with ED RN Fredric Mare.    Tadarius Maland O Ricky Doan  Trauma Response RN  Please call TRN at [phone] for further assistance.

## 2024-02-27 NOTE — ED Triage Notes (Addendum)
 Patient brought in by EMS for fall on thinners. Presents with hematoma to the forehead. Patient is alert and oriented x4 and talking. Patient states her left leg has been giving her trouble for e few days. Patient takes plavix. Extensive cardiac history.   From [facility]

## 2024-02-27 NOTE — ED Notes (Signed)
 PTAR took report and transported pt back to SNF  [name].

## 2024-02-27 NOTE — ED Notes (Signed)
 Attempted to call back SNF at [phone] ext [phone] to give updated report regarding pts departure and previous conversation. Pt has departed from the ED at this time while this RN was away.

## 2024-02-27 NOTE — ED Notes (Signed)
 RN was able to speak with RN Tally Joe at [facility] was informed on results of requesting imaging and no lab performed. Per EDP [place] labs are not necessary d/t pts current presentation of a fall. SNF denies any additional questions.

## 2024-02-27 NOTE — ED Notes (Signed)
 Spoke with Gaynelle Adu - CNA, provided with call back number for report. No RN available at this time to take report

## 2024-02-27 NOTE — ED Notes (Signed)
 Patient transported to CT

## 2024-02-27 NOTE — ED Notes (Signed)
 Called [name] - RN was able to provide name and call back number on voicemail requesting call back.

## 2024-02-28 ENCOUNTER — Non-Acute Institutional Stay: Payer: Self-pay | Admitting: Sports Medicine

## 2024-02-28 ENCOUNTER — Encounter: Payer: Self-pay | Admitting: Sports Medicine

## 2024-02-28 DIAGNOSIS — R296 Repeated falls: Secondary | ICD-10-CM | POA: Diagnosis not present

## 2024-02-28 DIAGNOSIS — G8929 Other chronic pain: Secondary | ICD-10-CM | POA: Diagnosis not present

## 2024-02-28 DIAGNOSIS — T148XXA Other injury of unspecified body region, initial encounter: Secondary | ICD-10-CM

## 2024-02-28 DIAGNOSIS — S22030S Wedge compression fracture of third thoracic vertebra, sequela: Secondary | ICD-10-CM

## 2024-02-28 DIAGNOSIS — E785 Hyperlipidemia, unspecified: Secondary | ICD-10-CM | POA: Diagnosis not present

## 2024-02-28 DIAGNOSIS — M25562 Pain in left knee: Secondary | ICD-10-CM

## 2024-02-28 DIAGNOSIS — I10 Essential (primary) hypertension: Secondary | ICD-10-CM | POA: Diagnosis not present

## 2024-02-28 LAB — COMPREHENSIVE METABOLIC PANEL
Albumin: 3.5 (ref 3.5–5.0)
Calcium: 8.9 (ref 8.7–10.7)
Globulin: 3.4

## 2024-02-28 LAB — CBC AND DIFFERENTIAL
HCT: 27 — AB (ref 36–46)
Hemoglobin: 8.8 — AB (ref 12.0–16.0)
Neutrophils Absolute: 4088
Platelets: 254 10*3/uL (ref 150–400)
WBC: 8

## 2024-02-28 LAB — BASIC METABOLIC PANEL
BUN: 18 (ref 4–21)
CO2: 20 (ref 13–22)
Chloride: 107 (ref 99–108)
Creatinine: 0.8 (ref 0.5–1.1)
Glucose: 124
Potassium: 4.3 meq/L (ref 3.5–5.1)
Sodium: 138 (ref 137–147)

## 2024-02-28 LAB — CBC: RBC: 2.74 — AB (ref 3.87–5.11)

## 2024-02-28 LAB — HEPATIC FUNCTION PANEL
ALT: 19 U/L (ref 7–35)
AST: 29 (ref 13–35)
Alkaline Phosphatase: 194 — AB (ref 25–125)
Bilirubin, Total: 0.9

## 2024-02-28 NOTE — Progress Notes (Signed)
 Provider:  Dr. Venita Sheffield Location:  [name] Place of Service:   Assisted living  PCP: Mast, Man X, NP Patient Care Team: Mast, Man X, NP as PCP - General (Internal Medicine) Runell Gess, MD as PCP - Cardiology (Cardiology) Janalyn Harder, MD (Inactive) as Consulting Physician (Dermatology)  Extended Emergency Contact Information Primary Emergency Contact: [name],[name] Address: [address]          [place] [address] [place] Home Phone: [phone] Mobile Phone: [phone] Relation: Daughter Secondary Emergency Contact: [name] [place] of [place] Home Phone: [phone] Mobile Phone: [phone] Relation: Daughter  Goals of Care: Advanced Directive information    02/27/2024    1:57 AM  Advanced Directives  Does Patient Have a Medical Advance Directive? No       History of Present Illness         89 year old female with a past medical history of coronary artery disease, hypertension, diet-controlled diabetes, anemia, hypothyroidism is evaluated today for an acute visit for ED follow-up. As per staff patient was found on the floor with her legs outstretched in front of her and back against her chair.  She was found to have a hematoma on left forehead and subsequently sent to the emergency room. Patient seen and examined in her room the patient sees she was trying to get up from her bed and slid off the bed and fell on the floor. Patient has a bruise on her left forehead, left eyelid and lower legs. Patient denies headache, nausea, vomiting, blurry or double vision.  States is sore but not painful.  Lower back-patient states she has chronic pain in her lower back.  Reports 3 out of 10 with no radiation. Denies lower extremity weakness, numbness.  Left knee pain-patient has history of left knee replacement. Complains of pain in her left knee when standing up and walking.  Reports 5 out of  10. Patient had 2 falls on Friday.  She says her legs give out when she tries to walk. Physical therapy referral was placed last week.  Patient is waiting to be seen by the therapist.  FINDINGS: Left knee joint replacement is seen. Vascular calcifications are identified. No acute fracture or dislocation is noted. No soft tissue abnormality is seen.   IMPRESSION: 1. No acute intracranial abnormality. Left forehead hematoma. 2. No acute fracture in the cervical spine. 3. Age indeterminate superior endplate compression fracture of T3. There is less than 10% vertebral body height loss. No retropulsion. 4. 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    FINDINGS: Sternotomy and CABG. Stable cardiomediastinal silhouette. Aortic atherosclerotic calcification. Elevated right hemidiaphragm and right basilar atelectasis. Additional left basilar atelectasis. Otherwise no focal consolidation. No pleural effusion or pneumothorax. No displaced rib fractures.   Past Medical History:  Diagnosis Date   BCC (basal cell carcinoma of skin) 01/09/2009   Lower Central Back (tx p bx)   CAD (coronary artery disease)    possible ant wall MI ('97) - cath & CABG x5   Exogenous obesity    GERD (gastroesophageal reflux disease)    History of nuclear stress test 09/2012   lexiscan; mild perfusion defect in apical anterior & apical region (infarct/scar) - no significant ischemia, low risk    History of total bilateral knee replacement    Hypertension    Hypothyroidism    Nodular basal cell carcinoma (BCC) 08/17/2017   Left Calf (tx p bx)   PVC's (premature  ventricular contractions)    SCC (squamous cell carcinoma) [place] 01/29/2004   Right Forearm (Cx3,5FU)   SCCA (squamous cell carcinoma) of skin 03/13/2005   Mid Upper Back   SCCA (squamous cell carcinoma) of skin 09/07/2005   Left Elbow(Cx3,5FU)   SCCA (squamous cell carcinoma) of skin 01/25/2006    Left Brow(in situ) (Cx3,5FU)   SCCA (squamous cell carcinoma) of skin 04/27/2006   Inner Left Shoulder(Keratoacanthoma) (Exc.)   SCCA (squamous cell carcinoma) of skin 03/22/2007   Left Temple(in situ) and Bridge of Nose(in situ) (Cs3,5FU)   SCCA (squamous cell carcinoma) of skin 05/12/2007   Top of Scalp(Cx3,5FU)   SCCA (squamous cell carcinoma) of skin 01/28/2011   Right Upper Arm(in situ)   SCCA (squamous cell carcinoma) of skin 07/26/2012   Glabella, Inferior Tip (Cx3,5FU)   SCCA (squamous cell carcinoma) of skin 03/08/2013   Left Front Scalp(in situ) and Upper Nose(in situ) (Cx3,5FU)   SCCA (squamous cell carcinoma) of skin 07/24/2013   Right Lower Leg(Keratoacanthoma)   SCCA (squamous cell carcinoma) of skin 09/11/2015   Left Scalp(in situ) (Cx3,5FU)   SCCA (squamous cell carcinoma) of skin 01/15/2016   Left Forearm(in situ) and Left Temple(in situ) (tx p bx)   SCCA (squamous cell carcinoma) of skin 10/01/2016   Left Mid Forearm(in situ)(tx p bx) and Left Front Scalp(watch)   SCCA (squamous cell carcinoma) of skin 11/12/2016   Left Temple(Keratoacanthoma) (watch)   SCCA (squamous cell carcinoma) of skin 02/11/2017   Top Left Hand(Keratoacanthoma) (tx p bx)   Squamous cell carcinoma in situ (SCCIS) 11/18/1999   Above Left Outer Eyebrow   Squamous cell carcinoma of scalp    removed - Dr. Jorja Loa   Superficial basal cell carcinoma (BCC) 07/14/2004   Left Scapula (Cx3,5FU)   Past Surgical History:  Procedure Laterality Date   ABDOMINAL HYSTERECTOMY  1984   APPENDECTOMY  1941   Carotid Doppler  12/07/2012   R & L ICA - 0-49% diameter reduction   CORONARY ARTERY BYPASS GRAFT  09/1996   cath & CABG x5 LIMA-LAD, SVG-sequential OD & OM, SVG sequential to PDA & PLA (Dr. Donata Clay)   REPLACEMENT TOTAL KNEE Bilateral 2001 & 2003   TONSILLECTOMY     TRANSTHORACIC ECHOCARDIOGRAM  08/23/2013   EF 50-55%, LV cavity size mod reduced, mild LVH, mild conc hypertrophy; mild AV stenosis &  mild regurg; mild MR - ordered for murmur    reports that she has never smoked. She has never used smokeless tobacco. She reports current alcohol use. No history on file for drug use. Social History   Socioeconomic History   Marital status: Widowed    Spouse name: Not on file   Number of children: 4   Years of education: 11   Highest education level: Not on file  Occupational History   Not on file  Tobacco Use   Smoking status: Never   Smokeless tobacco: Never  Vaping Use   Vaping status: Not on file  Substance and Sexual Activity   Alcohol use: Yes    Comment: "a drink of wine every now and then"   Drug use: Not on file   Sexual activity: Not on file  Other Topics Concern   Not on file  Social History Narrative   Not on file   Social Drivers of Health   Financial Resource Strain: Not on file  Food Insecurity: Not on file  Transportation Needs: Not on file  Physical Activity: Not on file  Stress: Not on  file  Social Connections: Not on file  Intimate Partner Violence: Not on file    Functional Status Survey:    Family History  Problem Relation Age of Onset   Heart attack Mother    Heart attack Father    Cancer Sister    Diabetes Sister    Heart Problems Sister     Health Maintenance  Topic Date Due   FOOT EXAM  Never done   OPHTHALMOLOGY EXAM  Never done   Zoster Vaccines- Shingrix (1 of 2) Never done   Pneumonia Vaccine 67+ Years old (2 of 2 - PPSV23 or PCV20) 03/25/2013   COVID-19 Vaccine (3 - Mixed Product risk series) 10/14/2021   DTaP/Tdap/Td (1 - Tdap) 09/28/2022   HEMOGLOBIN A1C  07/05/2024   Medicare Annual Wellness (AWV)  01/24/2025   INFLUENZA VACCINE  Completed   DEXA SCAN  Completed   HPV VACCINES  Aged Out    Allergies  Allergen Reactions   Mobic [Meloxicam] Other (See Comments)    Unknown reaction   Novocain [Procaine] Other (See Comments)    Unknown reaction    Outpatient Encounter Medications as of 02/28/2024  Medication Sig    acetaminophen (TYLENOL) 325 MG tablet Take 650 mg by mouth every 4 (four) hours as needed.   acetaminophen (TYLENOL) 500 MG tablet Take 500 mg by mouth at bedtime as needed (pain).   amLODipine (NORVASC) 5 MG tablet Take 5 mg by mouth daily.   Aspirin 81 MG CAPS Take 81 mg by mouth daily.   atorvastatin (LIPITOR) 40 MG tablet TAKE 1 TABLET BY MOUTH EVERY DAY   cholecalciferol (VITAMIN D3) 25 MCG (1000 UNIT) tablet Take 1,000 Units by mouth daily.   clopidogrel (PLAVIX) 75 MG tablet TAKE 1 TABLET BY MOUTH EVERY DAY   estradiol (ESTRACE) 0.1 MG/GM vaginal cream Place 1 Applicatorful vaginally See admin instructions. Apply 1g vaginally in the evening on Monday, Wednesday, Friday.   HYDROcodone-acetaminophen (NORCO/VICODIN) 5-325 MG tablet Take 0.5 tablets by mouth at bedtime.   hydrocortisone 2.5 % cream Apply 1 Application topically at bedtime.   isosorbide mononitrate (IMDUR) 60 MG 24 hr tablet TAKE 1 TABLET BY MOUTH EVERY DAY   levothyroxine (SYNTHROID) 75 MCG tablet Take 75 mcg by mouth daily before breakfast.   metoprolol succinate (TOPROL-XL) 100 MG 24 hr tablet TAKE 1 TABLET BY MOUTH EVERY DAY WITH OR IMMEDIATELY FOLLOWING A MEAL   Multiple Vitamin (MULTIVITAMIN WITH MINERALS) TABS tablet Take 1 tablet by mouth daily.   mupirocin ointment (BACTROBAN) 2 % Apply 1 Application topically daily as needed (irritation).   nitroGLYCERIN (NITROSTAT) 0.4 MG SL tablet Place 0.4 mg under the tongue every 5 (five) minutes as needed for chest pain.   potassium chloride (KLOR-CON) 10 MEQ tablet Take 10 mEq by mouth daily as needed.   Facility-Administered Encounter Medications as of 02/28/2024  Medication   triamcinolone acetonide (KENALOG-40) injection 40 mg    Review of Systems  Constitutional:  Negative for fever.  HENT:  Negative for sore throat.   Respiratory:  Negative for cough, shortness of breath and wheezing.   Cardiovascular:  Negative for chest pain, palpitations and leg swelling.   Gastrointestinal:  Negative for abdominal distention, abdominal pain, blood in stool, constipation, diarrhea, nausea and vomiting.  Genitourinary:  Negative for dysuria, frequency and urgency.  Musculoskeletal:  Positive for arthralgias and back pain.  Neurological:  Negative for dizziness, weakness and numbness.   Negative unless indicated in HPI.  There were no vitals filed  for this visit. There is no height or weight on file to calculate BMI. BP Readings from Last 3 Encounters:  02/27/24 (!) 122/50  02/25/24 138/68  01/25/24 (!) 128/56   Wt Readings from Last 3 Encounters:  02/27/24 160 lb (72.6 kg)  02/25/24 158 lb 3.2 oz (71.8 kg)  01/25/24 159 lb 12.8 oz (72.5 kg)   Physical Exam Constitutional:      Appearance: Normal appearance.  HENT:     Head: Normocephalic and atraumatic.  Cardiovascular:     Rate and Rhythm: Normal rate and regular rhythm.     Heart sounds: Murmur heard.  Pulmonary:     Effort: Pulmonary effort is normal. No respiratory distress.     Breath sounds: Normal breath sounds. No wheezing.  Abdominal:     General: Bowel sounds are normal. There is no distension.     Tenderness: There is no abdominal tenderness. There is no guarding or rebound.     Comments:    Musculoskeletal:        General: No swelling or tenderness.     Comments: Lower back - mild spinal tenderness Left knee- no redness, no swelling, bruising +  Rom limited due to pain Crepitus +  Neurological:     Mental Status: She is alert. Mental status is at baseline.     Sensory: No sensory deficit.     Motor: No weakness.     Labs reviewed: Basic Metabolic Panel: Recent Labs    07/07/23 2302 08/10/23 1548 08/11/23 0927 10/05/23 0600 01/06/24 0000  NA 137 132* 133* 138 141  K 4.0 2.8* 3.5 4.2 4.6  CL 100 101 103 104 106  CO2 24 23 20* 28 24*  GLUCOSE 121* 140* 115* 90  --   BUN 15 8 7* 14 24*  CREATININE 0.93 0.82 0.79 0.87 0.9  CALCIUM 8.7* 8.1* 8.0* 8.6 9.1  MG 1.8 1.7   --   --   --    Liver Function Tests: Recent Labs    08/10/23 1548 01/06/24 0000  AST 23 55*  ALT 14 53*  ALKPHOS 108 274*  BILITOT 0.9  --   PROT 6.7  --   ALBUMIN 2.8* 3.3*   No results for input(s): "LIPASE", "AMYLASE" in the last 8760 hours. No results for input(s): "AMMONIA" in the last 8760 hours. CBC: Recent Labs    07/07/23 2302 08/10/23 1548 01/06/24 0000  WBC 7.7 6.0 7.3  NEUTROABS 4.2 4.0  --   HGB 10.4* 10.1* 9.2*  HCT 32.7* 31.1* 28*  MCV 104.8* 100.0  --   PLT 203 196 227   Cardiac Enzymes: No results for input(s): "CKTOTAL", "CKMB", "CKMBINDEX", "TROPONINI" in the last 8760 hours. BNP: Invalid input(s): "POCBNP" Lab Results  Component Value Date   HGBA1C 6.5 01/06/2024   Lab Results  Component Value Date   TSH 2.78 01/06/2024   Lab Results  Component Value Date   VITAMINB12 320 09/13/2013   No results found for: "FOLATE" No results found for: "IRON", "TIBC", "FERRITIN"  Imaging and Procedures obtained prior to SNF admission: DG Tibia/Fibula Left Result Date: 02/27/2024 CLINICAL DATA:  Recent fall with left leg pain, initial encounter EXAM: LEFT TIBIA AND FIBULA - 2 VIEW COMPARISON:  None Available. FINDINGS: Left knee joint replacement is seen. Vascular calcifications are identified. No acute fracture or dislocation is noted. No soft tissue abnormality is seen. IMPRESSION: No acute abnormality noted. Electronically Signed   By: Alcide Clever M.D.   On: 02/27/2024  03:48   CT Head Wo Contrast Result Date: 02/27/2024 CLINICAL DATA:  Level 2 fall, on blood thinners, hematoma to left forehead EXAM: CT HEAD WITHOUT CONTRAST CT CERVICAL SPINE WITHOUT CONTRAST TECHNIQUE: Multidetector CT imaging of the head and cervical spine was performed following the standard protocol without intravenous contrast. Multiplanar CT image reconstructions of the cervical spine were also generated. RADIATION DOSE REDUCTION: This exam was performed according to the departmental  dose-optimization program which includes automated exposure control, adjustment of the mA and/or kV according to patient size and/or use of iterative reconstruction technique. COMPARISON:  CT head and C-spine 12/07/2023 FINDINGS: CT HEAD FINDINGS Brain: No intracranial hemorrhage, mass effect, or evidence of acute infarct. No hydrocephalus. 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 Age-commensurate cerebral atrophy and chronic small vessel ischemic disease. Vascular: No hyperdense vessel. Intracranial arterial calcification. Skull: No fracture or focal lesion.  Left forehead hematoma. Sinuses/Orbits: No acute finding. Other: None. CT CERVICAL SPINE FINDINGS Alignment: No evidence of traumatic malalignment. Chronic subluxations of multiple vertebral bodies are unchanged. Skull base and vertebrae: No acute fracture in the cervical spine. Age indeterminate superior endplate compression fracture of T3, new since 12/07/2023. There is less than 10% vertebral body height loss. No retropulsion. Sclerosis in the superior endplate of T3 is new compared to 12/07/2023. Soft tissues and spinal canal: No prevertebral fluid or swelling. No visible canal hematoma. Disc levels: Degenerative pannus formation about the atlantoaxial joint is unchanged. Multilevel spondylosis and facet arthropathy is unchanged. No severe spinal canal narrowing. Upper chest: No acute abnormality. Other: Carotid calcification. IMPRESSION: 1. No acute intracranial abnormality. Left forehead hematoma. 2. No acute fracture in the cervical spine. 3. Age indeterminate superior endplate compression fracture of T3. There is less than 10% vertebral body height loss. No retropulsion. 4. 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 Electronically Signed   By: Minerva Fester M.D.   On: 02/27/2024 02:32   CT Cervical Spine Wo Contrast Result Date: 02/27/2024 CLINICAL  DATA:  Level 2 fall, on blood thinners, hematoma to left forehead EXAM: CT HEAD WITHOUT CONTRAST CT CERVICAL SPINE WITHOUT CONTRAST TECHNIQUE: Multidetector CT imaging of the head and cervical spine was performed following the standard protocol without intravenous contrast. Multiplanar CT image reconstructions of the cervical spine were also generated. RADIATION DOSE REDUCTION: This exam was performed according to the departmental dose-optimization program which includes automated exposure control, adjustment of the mA and/or kV according to patient size and/or use of iterative reconstruction technique. COMPARISON:  CT head and C-spine 12/07/2023 FINDINGS: CT HEAD FINDINGS Brain: No intracranial hemorrhage, mass effect, or evidence of acute infarct. No hydrocephalus. 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 Age-commensurate cerebral atrophy and chronic small vessel ischemic disease. Vascular: No hyperdense vessel. Intracranial arterial calcification. Skull: No fracture or focal lesion.  Left forehead hematoma. Sinuses/Orbits: No acute finding. Other: None. CT CERVICAL SPINE FINDINGS Alignment: No evidence of traumatic malalignment. Chronic subluxations of multiple vertebral bodies are unchanged. Skull base and vertebrae: No acute fracture in the cervical spine. Age indeterminate superior endplate compression fracture of T3, new since 12/07/2023. There is less than 10% vertebral body height loss. No retropulsion. Sclerosis in the superior endplate of T3 is new compared to 12/07/2023. Soft tissues and spinal canal: No prevertebral fluid or swelling. No visible canal hematoma. Disc levels: Degenerative pannus formation about the atlantoaxial joint is unchanged. Multilevel spondylosis and facet arthropathy is unchanged. No severe spinal canal narrowing. Upper chest:  No acute abnormality. Other: Carotid calcification. IMPRESSION: 1. No acute intracranial abnormality. Left  forehead hematoma. 2. No acute fracture in the cervical spine. 3. Age indeterminate superior endplate compression fracture of T3. There is less than 10% vertebral body height loss. No retropulsion. 4. 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 Electronically Signed   By: Minerva Fester M.D.   On: 02/27/2024 02:32   DG Pelvis Portable Result Date: 02/27/2024 CLINICAL DATA:  Level 2 trauma, fall, on blood thinners EXAM: PORTABLE PELVIS 1-2 VIEWS COMPARISON:  12/07/2023 FINDINGS: No acute fracture or dislocation. Degenerative changes pubic symphysis, both hips, SI joints and lower lumbar spine. IMPRESSION: No acute fracture or dislocation. Electronically Signed   By: Minerva Fester M.D.   On: 02/27/2024 02:04   DG Chest Portable 1 View Result Date: 02/27/2024 CLINICAL DATA:  Level 2 trauma, fall on blood thinners EXAM: PORTABLE CHEST 1 VIEW COMPARISON:  12/07/2023 FINDINGS: Sternotomy and CABG. Stable cardiomediastinal silhouette. Aortic atherosclerotic calcification. Elevated right hemidiaphragm and right basilar atelectasis. Additional left basilar atelectasis. Otherwise no focal consolidation. No pleural effusion or pneumothorax. No displaced rib fractures. IMPRESSION: No change from 12/07/2023. Bibasilar atelectasis. Elevated right hemidiaphragm. Electronically Signed   By: Minerva Fester M.D.   On: 02/27/2024 02:03    Assessment and Plan        1. Hematoma (Primary) Ct head neg Pt denies headache, nausea, vomiting Will monitor  Cont with neurochecks  2. Recurrent falls Will start PT eval  3. Compression fracture of T3 vertebra, sequela C/o mild lower back pain  We will start a lidocaine patch Increase Tylenol to 500 mg 3 times daily, continue with Norco at bedtime  4. Chronic pain of left knee Will start diclofenac cream for left knee Lidocaine patch Will start PT   40 min Total time spent for obtaining history,  performing a  medically appropriate examination and evaluation, reviewing the tests   documenting clinical information in the electronic or other health record,  ,care coordination (not separately reported)      Family/ staff Communication:   Labs/tests ordered:  Venita Sheffield

## 2024-02-29 ENCOUNTER — Non-Acute Institutional Stay: Payer: Self-pay | Admitting: Nurse Practitioner

## 2024-02-29 DIAGNOSIS — R296 Repeated falls: Secondary | ICD-10-CM | POA: Diagnosis not present

## 2024-02-29 DIAGNOSIS — E876 Hypokalemia: Secondary | ICD-10-CM | POA: Insufficient documentation

## 2024-02-29 DIAGNOSIS — E039 Hypothyroidism, unspecified: Secondary | ICD-10-CM | POA: Diagnosis not present

## 2024-02-29 DIAGNOSIS — I10 Essential (primary) hypertension: Secondary | ICD-10-CM

## 2024-02-29 DIAGNOSIS — M545 Low back pain, unspecified: Secondary | ICD-10-CM

## 2024-02-29 DIAGNOSIS — E559 Vitamin D deficiency, unspecified: Secondary | ICD-10-CM

## 2024-02-29 DIAGNOSIS — E785 Hyperlipidemia, unspecified: Secondary | ICD-10-CM | POA: Diagnosis not present

## 2024-02-29 DIAGNOSIS — G8929 Other chronic pain: Secondary | ICD-10-CM | POA: Diagnosis not present

## 2024-02-29 DIAGNOSIS — Z951 Presence of aortocoronary bypass graft: Secondary | ICD-10-CM | POA: Diagnosis not present

## 2024-02-29 DIAGNOSIS — D649 Anemia, unspecified: Secondary | ICD-10-CM | POA: Diagnosis not present

## 2024-02-29 DIAGNOSIS — N952 Postmenopausal atrophic vaginitis: Secondary | ICD-10-CM

## 2024-02-29 DIAGNOSIS — E119 Type 2 diabetes mellitus without complications: Secondary | ICD-10-CM

## 2024-02-29 NOTE — Assessment & Plan Note (Signed)
 taking Estrace vaginal cream.

## 2024-02-29 NOTE — Assessment & Plan Note (Signed)
 Blood pressure is controlled, taking Metoprolol, Amlodipine, Bun/creat 18/0.82 02/28/24

## 2024-02-29 NOTE — Assessment & Plan Note (Signed)
 hx of CABG, dc Plavix due to frequent fall/hematoma, continue ASA, Atorvastatin, Isosorbide.

## 2024-02-29 NOTE — Assessment & Plan Note (Signed)
 Hgb A1c 6.5 01/06/24, diet controlled.

## 2024-02-29 NOTE — Assessment & Plan Note (Signed)
 LDL 84 01/06/24, on Atorvastatin.

## 2024-02-29 NOTE — Progress Notes (Unsigned)
 Location:   [facility] Room Number: 818 Place of Service:  ALF (13) Provider: Arna Snipe Devontae Casasola NP  Amara Justen X, NP  Patient Care Team: Madolin Twaddle X, NP as PCP - General (Internal Medicine) Runell Gess, MD as PCP - Cardiology (Cardiology) Janalyn Harder, MD (Inactive) as Consulting Physician (Dermatology)  Extended Emergency Contact Information Primary Emergency Contact: [name],[name] Address: [address]          [place] [address] [place] Home Phone: [phone] Mobile Phone: [phone] Relation: Daughter Secondary Emergency Contact: [name] [place] Home Phone: [phone] Mobile Phone: [phone] Relation: Daughter  Code Status: DNR Goals of care: Advanced Directive information    02/27/2024    1:57 AM  Advanced Directives  Does Patient Have a Medical Advance Directive? No     Chief Complaint  Patient presents with   Acute Visit    Fall, lower leg numbness    HPI:  Pt is a 89 y.o. female seen today for an acute visit for fall 02/28/24 when the patient was found sitting on the floor, the 5th fall in the past 2 weeks, c/o dizziness and lower leg numbness when fell, no orthostatic hypotension noted, noted left knee pain and bruise(Xray L tibia/fibula negative fx), also noted skin tear left elbow, hematoma left forehead sustained from fall 02/26/24(CT head/cervical spine unremarkable). ED eval 02/27/24.   Negative for fx or displacement  X-ray pelvis 12/07/23, lower back pain is managed on Norco  CT cervical spine 02/27/24, Age indeterminate superior endplate compression fracture of T3. Pending HPOA decision: f/u neurosurgery.    HTN, taking Metoprolol, Amlodipine, Bun/creat 18/0.82 02/28/24             CAD, hx of CABG, dc Plavix, continue ASA, Atorvastatin, Isosorbide.              Vit D deficiency, on Vit D             Atrophic vaginitis, taking Estrace vaginal cream.              Anemia, Iron 25, Vit B12  325 01/25/24, placed on Fe, Hgb 8.8 02/28/24  Hypokalemia, K 4.3 02/28/24, supplemented with Kcl.              T2DM, Hgb A1c 6.5 01/06/24, diet controlled.              Edema BLE, chronic, minimal             Hypothyroidism, TSH 2.78 01/06/24, on Levothyroxine.              HLD LDL 84 01/06/24, on Atorvastatin.              Urinary frequency, increased urinary frequency, incontinence, but denied dysuria, she is afebrile.    Past Medical History:  Diagnosis Date   BCC (basal cell carcinoma of skin) 01/09/2009   Lower Central Back (tx p bx)   CAD (coronary artery disease)    possible ant wall MI ('97) - cath & CABG x5   Exogenous obesity    GERD (gastroesophageal reflux disease)    History of nuclear stress test 09/2012   lexiscan; mild perfusion defect in apical anterior & apical region (infarct/scar) - no significant ischemia, low risk    History of total bilateral knee replacement    Hypertension    Hypothyroidism    Nodular basal cell carcinoma (BCC) 08/17/2017   Left Calf (tx p bx)   PVC's (  premature ventricular contractions)    SCC (squamous cell carcinoma) [place] 01/29/2004   Right Forearm (Cx3,5FU)   SCCA (squamous cell carcinoma) of skin 03/13/2005   Mid Upper Back   SCCA (squamous cell carcinoma) of skin 09/07/2005   Left Elbow(Cx3,5FU)   SCCA (squamous cell carcinoma) of skin 01/25/2006   Left Brow(in situ) (Cx3,5FU)   SCCA (squamous cell carcinoma) of skin 04/27/2006   Inner Left Shoulder(Keratoacanthoma) (Exc.)   SCCA (squamous cell carcinoma) of skin 03/22/2007   Left Temple(in situ) and Bridge of Nose(in situ) (Cs3,5FU)   SCCA (squamous cell carcinoma) of skin 05/12/2007   Top of Scalp(Cx3,5FU)   SCCA (squamous cell carcinoma) of skin 01/28/2011   Right Upper Arm(in situ)   SCCA (squamous cell carcinoma) of skin 07/26/2012   Glabella, Inferior Tip (Cx3,5FU)   SCCA (squamous cell carcinoma) of skin 03/08/2013   Left Front Scalp(in situ) and Upper Nose(in situ) (Cx3,5FU)    SCCA (squamous cell carcinoma) of skin 07/24/2013   Right Lower Leg(Keratoacanthoma)   SCCA (squamous cell carcinoma) of skin 09/11/2015   Left Scalp(in situ) (Cx3,5FU)   SCCA (squamous cell carcinoma) of skin 01/15/2016   Left Forearm(in situ) and Left Temple(in situ) (tx p bx)   SCCA (squamous cell carcinoma) of skin 10/01/2016   Left Mid Forearm(in situ)(tx p bx) and Left Front Scalp(watch)   SCCA (squamous cell carcinoma) of skin 11/12/2016   Left Temple(Keratoacanthoma) (watch)   SCCA (squamous cell carcinoma) of skin 02/11/2017   Top Left Hand(Keratoacanthoma) (tx p bx)   Squamous cell carcinoma in situ (SCCIS) 11/18/1999   Above Left Outer Eyebrow   Squamous cell carcinoma of scalp    removed - Dr. Jorja Loa   Superficial basal cell carcinoma (BCC) 07/14/2004   Left Scapula (Cx3,5FU)   Past Surgical History:  Procedure Laterality Date   ABDOMINAL HYSTERECTOMY  1984   APPENDECTOMY  1941   Carotid Doppler  12/07/2012   R & L ICA - 0-49% diameter reduction   CORONARY ARTERY BYPASS GRAFT  09/1996   cath & CABG x5 LIMA-LAD, SVG-sequential OD & OM, SVG sequential to PDA & PLA (Dr. Donata Clay)   REPLACEMENT TOTAL KNEE Bilateral 2001 & 2003   TONSILLECTOMY     TRANSTHORACIC ECHOCARDIOGRAM  08/23/2013   EF 50-55%, LV cavity size mod reduced, mild LVH, mild conc hypertrophy; mild AV stenosis & mild regurg; mild MR - ordered for murmur    Allergies  Allergen Reactions   Mobic [Meloxicam] Other (See Comments)    Unknown reaction   Novocain [Procaine] Other (See Comments)    Unknown reaction    Allergies as of 02/29/2024       Reactions   Mobic [meloxicam] Other (See Comments)   Unknown reaction   Novocain [procaine] Other (See Comments)   Unknown reaction        Medication List        Accurate as of February 29, 2024 11:59 PM. If you have any questions, ask your nurse or doctor.          acetaminophen 500 MG tablet Commonly known as: TYLENOL Take 500 mg by mouth at  bedtime as needed (pain).   acetaminophen 325 MG tablet Commonly known as: TYLENOL Take 650 mg by mouth every 4 (four) hours as needed.   amLODipine 5 MG tablet Commonly known as: NORVASC Take 5 mg by mouth daily.   Aspirin 81 MG Caps Take 81 mg by mouth daily.   atorvastatin 40 MG tablet Commonly known as:  LIPITOR TAKE 1 TABLET BY MOUTH EVERY DAY   cholecalciferol 25 MCG (1000 UNIT) tablet Commonly known as: VITAMIN D3 Take 1,000 Units by mouth daily.   clopidogrel 75 MG tablet Commonly known as: PLAVIX TAKE 1 TABLET BY MOUTH EVERY DAY   estradiol 0.1 MG/GM vaginal cream Commonly known as: ESTRACE Place 1 Applicatorful vaginally See admin instructions. Apply 1g vaginally in the evening on Monday, Wednesday, Friday.   HYDROcodone-acetaminophen 5-325 MG tablet Commonly known as: NORCO/VICODIN Take 0.5 tablets by mouth at bedtime.   hydrocortisone 2.5 % cream Apply 1 Application topically at bedtime.   isosorbide mononitrate 60 MG 24 hr tablet Commonly known as: IMDUR TAKE 1 TABLET BY MOUTH EVERY DAY   levothyroxine 75 MCG tablet Commonly known as: SYNTHROID Take 75 mcg by mouth daily before breakfast.   metoprolol succinate 100 MG 24 hr tablet Commonly known as: TOPROL-XL TAKE 1 TABLET BY MOUTH EVERY DAY WITH OR IMMEDIATELY FOLLOWING A MEAL   multivitamin with minerals Tabs tablet Take 1 tablet by mouth daily.   mupirocin ointment 2 % Commonly known as: BACTROBAN Apply 1 Application topically daily as needed (irritation).   nitroGLYCERIN 0.4 MG SL tablet Commonly known as: NITROSTAT Place 0.4 mg under the tongue every 5 (five) minutes as needed for chest pain.   potassium chloride 10 MEQ tablet Commonly known as: KLOR-CON Take 10 mEq by mouth daily as needed.        Review of Systems  Constitutional:  Negative for appetite change, fatigue and fever.  HENT:  Positive for hearing loss. Negative for congestion and trouble swallowing.   Eyes:  Negative  for visual disturbance.  Respiratory:  Negative for cough, chest tightness and wheezing.   Cardiovascular:  Positive for leg swelling.  Gastrointestinal:  Negative for abdominal pain and constipation.  Genitourinary:  Positive for frequency and urgency. Negative for dysuria.       Incontinent of urine.   Musculoskeletal:  Positive for arthralgias, back pain and gait problem.  Skin:  Positive for wound.  Neurological:  Positive for numbness. Negative for speech difficulty, weakness and headaches.       Lower legs.   Psychiatric/Behavioral:  Negative for confusion and sleep disturbance. The patient is not nervous/anxious.     Immunization History  Administered Date(s) Administered   Fluad Quad(high Dose 65+) 08/30/2019   Influenza, High Dose Seasonal PF 10/10/2018, 09/29/2023   Influenza,inj,quad, With Preservative 08/30/2019   Pneumococcal Conjugate-13 01/28/2013   Td (Adult) 09/27/2022   Unspecified SARS-COV-2 Vaccination 01/01/2020, 09/16/2021   Pertinent  Health Maintenance Due  Topic Date Due   FOOT EXAM  Never done   OPHTHALMOLOGY EXAM  Never done   HEMOGLOBIN A1C  07/05/2024   INFLUENZA VACCINE  Completed   DEXA SCAN  Completed      07/09/2021   10:00 PM 07/10/2021    8:53 AM 07/10/2021    9:00 PM 07/11/2021    8:40 AM 07/21/2021   10:02 AM  Fall Risk  (RETIRED) Patient Fall Risk Level Moderate fall risk Moderate fall risk Moderate fall risk Moderate fall risk Low fall risk   Functional Status Survey:    Vitals:   02/29/24 1135  BP: 114/60  Pulse: 74  Resp: 18  Temp: 98.3 F (36.8 C)  SpO2: 97%  Weight: 154 lb 9.6 oz (70.1 kg)   Body mass index is 24.95 kg/m. Physical Exam Vitals and nursing note reviewed.  Constitutional:      Appearance: Normal appearance.  HENT:  Head: Normocephalic and atraumatic.     Nose: Nose normal.     Mouth/Throat:     Mouth: Mucous membranes are moist.  Eyes:     Extraocular Movements: Extraocular movements intact.      Conjunctiva/sclera: Conjunctivae normal.     Pupils: Pupils are equal, round, and reactive to light.  Cardiovascular:     Rate and Rhythm: Normal rate and regular rhythm.     Heart sounds: Murmur heard.  Pulmonary:     Effort: Pulmonary effort is normal.     Breath sounds: No rales.  Abdominal:     General: Bowel sounds are normal.     Palpations: Abdomen is soft.     Tenderness: There is no abdominal tenderness.  Musculoskeletal:        General: Tenderness present.     Cervical back: Normal range of motion and neck supple.     Right lower leg: Edema present.     Left lower leg: Edema present.     Comments: R SIJ tenderness palpated, with movement, better on heating pad Trace edema BLE Left knee pain  Skin:    General: Skin is warm and dry.     Findings: Bruising present.     Comments: Left forehead hematoma, left orbital ecchymoses, left knee pain/bruise, left elbow skin tear, no active bleeding or s/s of infection.   Neurological:     General: No focal deficit present.     Mental Status: She is alert and oriented to person, place, and time. Mental status is at baseline.     Motor: No weakness.     Coordination: Coordination normal.     Gait: Gait abnormal.  Psychiatric:        Mood and Affect: Mood normal.        Behavior: Behavior normal.        Thought Content: Thought content normal.        Judgment: Judgment normal.     Labs reviewed: Recent Labs    07/07/23 2302 08/10/23 1548 08/11/23 0927 10/05/23 0600 01/06/24 0000  NA 137 132* 133* 138 141  K 4.0 2.8* 3.5 4.2 4.6  CL 100 101 103 104 106  CO2 24 23 20* 28 24*  GLUCOSE 121* 140* 115* 90  --   BUN 15 8 7* 14 24*  CREATININE 0.93 0.82 0.79 0.87 0.9  CALCIUM 8.7* 8.1* 8.0* 8.6 9.1  MG 1.8 1.7  --   --   --    Recent Labs    08/10/23 1548 01/06/24 0000  AST 23 55*  ALT 14 53*  ALKPHOS 108 274*  BILITOT 0.9  --   PROT 6.7  --   ALBUMIN 2.8* 3.3*   Recent Labs    07/07/23 2302 08/10/23 1548  01/06/24 0000  WBC 7.7 6.0 7.3  NEUTROABS 4.2 4.0  --   HGB 10.4* 10.1* 9.2*  HCT 32.7* 31.1* 28*  MCV 104.8* 100.0  --   PLT 203 196 227   Lab Results  Component Value Date   TSH 2.78 01/06/2024   Lab Results  Component Value Date   HGBA1C 6.5 01/06/2024   Lab Results  Component Value Date   CHOL 151 01/06/2024   HDL 48 01/06/2024   LDLCALC 84 01/06/2024   TRIG 101 01/06/2024   CHOLHDL 3.0 07/09/2021    Significant Diagnostic Results in last 30 days:  DG Tibia/Fibula Left Result Date: 02/27/2024 CLINICAL DATA:  Recent fall with left leg pain, initial encounter  EXAM: LEFT TIBIA AND FIBULA - 2 VIEW COMPARISON:  None Available. FINDINGS: Left knee joint replacement is seen. Vascular calcifications are identified. No acute fracture or dislocation is noted. No soft tissue abnormality is seen. IMPRESSION: No acute abnormality noted. Electronically Signed   By: Alcide Clever M.D.   On: 02/27/2024 03:48   CT Head Wo Contrast Result Date: 02/27/2024 CLINICAL DATA:  Level 2 fall, on blood thinners, hematoma to left forehead EXAM: CT HEAD WITHOUT CONTRAST CT CERVICAL SPINE WITHOUT CONTRAST TECHNIQUE: Multidetector CT imaging of the head and cervical spine was performed following the standard protocol without intravenous contrast. Multiplanar CT image reconstructions of the cervical spine were also generated. RADIATION DOSE REDUCTION: This exam was performed according to the departmental dose-optimization program which includes automated exposure control, adjustment of the mA and/or kV according to patient size and/or use of iterative reconstruction technique. COMPARISON:  CT head and C-spine 12/07/2023 FINDINGS: CT HEAD FINDINGS Brain: No intracranial hemorrhage, mass effect, or evidence of acute infarct. No hydrocephalus. 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 Age-commensurate cerebral atrophy and chronic small vessel ischemic disease. Vascular:  No hyperdense vessel. Intracranial arterial calcification. Skull: No fracture or focal lesion.  Left forehead hematoma. Sinuses/Orbits: No acute finding. Other: None. CT CERVICAL SPINE FINDINGS Alignment: No evidence of traumatic malalignment. Chronic subluxations of multiple vertebral bodies are unchanged. Skull base and vertebrae: No acute fracture in the cervical spine. Age indeterminate superior endplate compression fracture of T3, new since 12/07/2023. There is less than 10% vertebral body height loss. No retropulsion. Sclerosis in the superior endplate of T3 is new compared to 12/07/2023. Soft tissues and spinal canal: No prevertebral fluid or swelling. No visible canal hematoma. Disc levels: Degenerative pannus formation about the atlantoaxial joint is unchanged. Multilevel spondylosis and facet arthropathy is unchanged. No severe spinal canal narrowing. Upper chest: No acute abnormality. Other: Carotid calcification. IMPRESSION: 1. No acute intracranial abnormality. Left forehead hematoma. 2. No acute fracture in the cervical spine. 3. Age indeterminate superior endplate compression fracture of T3. There is less than 10% vertebral body height loss. No retropulsion. 4. 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 Electronically Signed   By: Minerva Fester M.D.   On: 02/27/2024 02:32   CT Cervical Spine Wo Contrast Result Date: 02/27/2024 CLINICAL DATA:  Level 2 fall, on blood thinners, hematoma to left forehead EXAM: CT HEAD WITHOUT CONTRAST CT CERVICAL SPINE WITHOUT CONTRAST TECHNIQUE: Multidetector CT imaging of the head and cervical spine was performed following the standard protocol without intravenous contrast. Multiplanar CT image reconstructions of the cervical spine were also generated. RADIATION DOSE REDUCTION: This exam was performed according to the departmental dose-optimization program which includes automated exposure control, adjustment of the mA  and/or kV according to patient size and/or use of iterative reconstruction technique. COMPARISON:  CT head and C-spine 12/07/2023 FINDINGS: CT HEAD FINDINGS Brain: No intracranial hemorrhage, mass effect, or evidence of acute infarct. No hydrocephalus. 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 Age-commensurate cerebral atrophy and chronic small vessel ischemic disease. Vascular: No hyperdense vessel. Intracranial arterial calcification. Skull: No fracture or focal lesion.  Left forehead hematoma. Sinuses/Orbits: No acute finding. Other: None. CT CERVICAL SPINE FINDINGS Alignment: No evidence of traumatic malalignment. Chronic subluxations of multiple vertebral bodies are unchanged. Skull base and vertebrae: No acute fracture in the cervical spine. Age indeterminate superior endplate compression fracture of T3, new since 12/07/2023. There is less than 10% vertebral body height  loss. No retropulsion. Sclerosis in the superior endplate of T3 is new compared to 12/07/2023. Soft tissues and spinal canal: No prevertebral fluid or swelling. No visible canal hematoma. Disc levels: Degenerative pannus formation about the atlantoaxial joint is unchanged. Multilevel spondylosis and facet arthropathy is unchanged. No severe spinal canal narrowing. Upper chest: No acute abnormality. Other: Carotid calcification. IMPRESSION: 1. No acute intracranial abnormality. Left forehead hematoma. 2. No acute fracture in the cervical spine. 3. Age indeterminate superior endplate compression fracture of T3. There is less than 10% vertebral body height loss. No retropulsion. 4. 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 Electronically Signed   By: Minerva Fester M.D.   On: 02/27/2024 02:32   DG Pelvis Portable Result Date: 02/27/2024 CLINICAL DATA:  Level 2 trauma, fall, on blood thinners EXAM: PORTABLE PELVIS 1-2 VIEWS COMPARISON:  12/07/2023  FINDINGS: No acute fracture or dislocation. Degenerative changes pubic symphysis, both hips, SI joints and lower lumbar spine. IMPRESSION: No acute fracture or dislocation. Electronically Signed   By: Minerva Fester M.D.   On: 02/27/2024 02:04   DG Chest Portable 1 View Result Date: 02/27/2024 CLINICAL DATA:  Level 2 trauma, fall on blood thinners EXAM: PORTABLE CHEST 1 VIEW COMPARISON:  12/07/2023 FINDINGS: Sternotomy and CABG. Stable cardiomediastinal silhouette. Aortic atherosclerotic calcification. Elevated right hemidiaphragm and right basilar atelectasis. Additional left basilar atelectasis. Otherwise no focal consolidation. No pleural effusion or pneumothorax. No displaced rib fractures. IMPRESSION: No change from 12/07/2023. Bibasilar atelectasis. Elevated right hemidiaphragm. Electronically Signed   By: Minerva Fester M.D.   On: 02/27/2024 02:03    Assessment/Plan: Frequent falls  fall 02/28/24 when the patient was found sitting on the floor, the 5th fall in the past 2 weeks, c/o dizziness and lower leg numbness when fell, noted left knee pain and bruise(Xray L tibia/fibula negative fx), also noted skin tear left elbow, hematoma left forehead sustained from fall 02/26/24(CT head/cervical spine unremarkable). ED eval 02/27/24. No orthostatic hypotension noted, pending decision on neurosurgery consultation.  Will dc Plavix R>B, continue ASA 81mg  for now.  Therapy to eval and treat SNF for higher level of care and safety.   HTN (hypertension) Blood pressure is controlled, taking Metoprolol, Amlodipine, Bun/creat 18/0.82 02/28/24  S/P CABG x 3  hx of CABG, dc Plavix due to frequent fall/hematoma, continue ASA, Atorvastatin, Isosorbide.   Vitamin D deficiency  on Vit D  Atrophic vaginitis taking Estrace vaginal cream.   Anemia  Iron 25, Vit B12 325 01/25/24, placed on Fe, Hgb 8.8 02/28/24  Hypokalemia  K 4.3 02/28/24, supplemented with Kcl.   Type 2 diabetes, diet controlled (HCC) Hgb A1c 6.5  01/06/24, diet controlled.   Hypothyroidism  TSH 2.78 01/06/24, on Levothyroxine.   Dyslipidemia LDL 84 01/06/24, on Atorvastatin.   Chronic lower back pain CT cervical spine 02/27/24, Age indeterminate superior endplate compression fracture of T3. Pending HPOA decision: f/u neurosurgery.  Negative for fx or displacement  X-ray pelvis 12/07/23, lower back pain is managed on Norco    Family/ staff Communication: plan of care reviewed with the patient, the patient's daughter HPOA, and charge nurse.   Labs/tests ordered:  none

## 2024-02-29 NOTE — Assessment & Plan Note (Signed)
 TSH 2.78 01/06/24, on Levothyroxine.

## 2024-02-29 NOTE — Assessment & Plan Note (Signed)
 Iron 25, Vit B12 325 01/25/24, placed on Fe, Hgb 8.8 02/28/24

## 2024-02-29 NOTE — Assessment & Plan Note (Signed)
 K 4.3 02/28/24, supplemented with Kcl.

## 2024-02-29 NOTE — Assessment & Plan Note (Signed)
 on Vit D

## 2024-02-29 NOTE — Assessment & Plan Note (Addendum)
 fall 02/28/24 when the patient was found sitting on the floor, the 5th fall in the past 2 weeks, c/o dizziness when fell, noted left knee pain and bruise(Xray L tibia/fibula negative fx), also noted skin tear left elbow, hematoma left forehead sustained from fall 02/26/24(CT head/cervical spine unremarkable). ED eval 02/27/24.  Will dc Plavix R>B, continue ASA 81mg  for now.  Therapy to eval and treat

## 2024-03-01 ENCOUNTER — Encounter: Payer: Self-pay | Admitting: Nurse Practitioner

## 2024-03-01 NOTE — Assessment & Plan Note (Signed)
 CT cervical spine 02/27/24, Age indeterminate superior endplate compression fracture of T3. Pending HPOA decision: f/u neurosurgery.  Negative for fx or displacement  X-ray pelvis 12/07/23, lower back pain is managed on Norco

## 2024-03-02 DIAGNOSIS — M6281 Muscle weakness (generalized): Secondary | ICD-10-CM | POA: Diagnosis not present

## 2024-03-02 DIAGNOSIS — R29898 Other symptoms and signs involving the musculoskeletal system: Secondary | ICD-10-CM | POA: Diagnosis not present

## 2024-03-03 ENCOUNTER — Other Ambulatory Visit: Payer: Self-pay

## 2024-03-03 ENCOUNTER — Emergency Department (HOSPITAL_COMMUNITY)

## 2024-03-03 ENCOUNTER — Inpatient Hospital Stay (HOSPITAL_COMMUNITY)
Admission: EM | Admit: 2024-03-03 | Discharge: 2024-03-08 | DRG: 690 | Disposition: A | Discharge status: Skilled Nursing Facility | Source: Skilled Nursing Facility | Attending: Family Medicine | Admitting: Family Medicine

## 2024-03-03 ENCOUNTER — Encounter (HOSPITAL_COMMUNITY): Payer: Self-pay

## 2024-03-03 DIAGNOSIS — E1165 Type 2 diabetes mellitus with hyperglycemia: Secondary | ICD-10-CM | POA: Diagnosis not present

## 2024-03-03 DIAGNOSIS — Y92129 Unspecified place in nursing home as the place of occurrence of the external cause: Secondary | ICD-10-CM

## 2024-03-03 DIAGNOSIS — E785 Hyperlipidemia, unspecified: Secondary | ICD-10-CM | POA: Diagnosis present

## 2024-03-03 DIAGNOSIS — Z90710 Acquired absence of both cervix and uterus: Secondary | ICD-10-CM

## 2024-03-03 DIAGNOSIS — Z79899 Other long term (current) drug therapy: Secondary | ICD-10-CM | POA: Diagnosis not present

## 2024-03-03 DIAGNOSIS — E039 Hypothyroidism, unspecified: Secondary | ICD-10-CM | POA: Diagnosis not present

## 2024-03-03 DIAGNOSIS — Z7902 Long term (current) use of antithrombotics/antiplatelets: Secondary | ICD-10-CM

## 2024-03-03 DIAGNOSIS — E1122 Type 2 diabetes mellitus with diabetic chronic kidney disease: Secondary | ICD-10-CM | POA: Diagnosis not present

## 2024-03-03 DIAGNOSIS — M5136 Other intervertebral disc degeneration, lumbar region with discogenic back pain only: Secondary | ICD-10-CM | POA: Diagnosis not present

## 2024-03-03 DIAGNOSIS — S300XXA Contusion of lower back and pelvis, initial encounter: Secondary | ICD-10-CM | POA: Diagnosis present

## 2024-03-03 DIAGNOSIS — N1832 Chronic kidney disease, stage 3b: Secondary | ICD-10-CM | POA: Diagnosis present

## 2024-03-03 DIAGNOSIS — D631 Anemia in chronic kidney disease: Secondary | ICD-10-CM | POA: Diagnosis not present

## 2024-03-03 DIAGNOSIS — E8809 Other disorders of plasma-protein metabolism, not elsewhere classified: Secondary | ICD-10-CM | POA: Diagnosis present

## 2024-03-03 DIAGNOSIS — G992 Myelopathy in diseases classified elsewhere: Secondary | ICD-10-CM | POA: Diagnosis not present

## 2024-03-03 DIAGNOSIS — S0083XA Contusion of other part of head, initial encounter: Secondary | ICD-10-CM | POA: Diagnosis not present

## 2024-03-03 DIAGNOSIS — W1830XA Fall on same level, unspecified, initial encounter: Secondary | ICD-10-CM | POA: Diagnosis present

## 2024-03-03 DIAGNOSIS — R531 Weakness: Secondary | ICD-10-CM | POA: Diagnosis not present

## 2024-03-03 DIAGNOSIS — I129 Hypertensive chronic kidney disease with stage 1 through stage 4 chronic kidney disease, or unspecified chronic kidney disease: Secondary | ICD-10-CM | POA: Diagnosis present

## 2024-03-03 DIAGNOSIS — Z79890 Hormone replacement therapy (postmenopausal): Secondary | ICD-10-CM | POA: Diagnosis not present

## 2024-03-03 DIAGNOSIS — Z85828 Personal history of other malignant neoplasm of skin: Secondary | ICD-10-CM | POA: Diagnosis not present

## 2024-03-03 DIAGNOSIS — N390 Urinary tract infection, site not specified: Secondary | ICD-10-CM | POA: Diagnosis present

## 2024-03-03 DIAGNOSIS — Z7401 Bed confinement status: Secondary | ICD-10-CM | POA: Diagnosis not present

## 2024-03-03 DIAGNOSIS — M4801 Spinal stenosis, occipito-atlanto-axial region: Secondary | ICD-10-CM | POA: Diagnosis present

## 2024-03-03 DIAGNOSIS — D649 Anemia, unspecified: Secondary | ICD-10-CM | POA: Diagnosis not present

## 2024-03-03 DIAGNOSIS — I10 Essential (primary) hypertension: Secondary | ICD-10-CM | POA: Diagnosis not present

## 2024-03-03 DIAGNOSIS — M6281 Muscle weakness (generalized): Secondary | ICD-10-CM | POA: Diagnosis not present

## 2024-03-03 DIAGNOSIS — K5901 Slow transit constipation: Secondary | ICD-10-CM | POA: Diagnosis not present

## 2024-03-03 DIAGNOSIS — Z8249 Family history of ischemic heart disease and other diseases of the circulatory system: Secondary | ICD-10-CM

## 2024-03-03 DIAGNOSIS — Z833 Family history of diabetes mellitus: Secondary | ICD-10-CM

## 2024-03-03 DIAGNOSIS — G9529 Other cord compression: Secondary | ICD-10-CM | POA: Diagnosis not present

## 2024-03-03 DIAGNOSIS — I350 Nonrheumatic aortic (valve) stenosis: Secondary | ICD-10-CM | POA: Diagnosis not present

## 2024-03-03 DIAGNOSIS — N3000 Acute cystitis without hematuria: Secondary | ICD-10-CM | POA: Diagnosis not present

## 2024-03-03 DIAGNOSIS — Z96653 Presence of artificial knee joint, bilateral: Secondary | ICD-10-CM | POA: Diagnosis present

## 2024-03-03 DIAGNOSIS — M549 Dorsalgia, unspecified: Secondary | ICD-10-CM | POA: Diagnosis not present

## 2024-03-03 DIAGNOSIS — M799 Soft tissue disorder, unspecified: Secondary | ICD-10-CM | POA: Diagnosis not present

## 2024-03-03 DIAGNOSIS — S3991XA Unspecified injury of abdomen, initial encounter: Secondary | ICD-10-CM | POA: Diagnosis not present

## 2024-03-03 DIAGNOSIS — E871 Hypo-osmolality and hyponatremia: Secondary | ICD-10-CM | POA: Diagnosis present

## 2024-03-03 DIAGNOSIS — T148XXA Other injury of unspecified body region, initial encounter: Secondary | ICD-10-CM | POA: Diagnosis not present

## 2024-03-03 DIAGNOSIS — M419 Scoliosis, unspecified: Secondary | ICD-10-CM | POA: Diagnosis not present

## 2024-03-03 DIAGNOSIS — D62 Acute posthemorrhagic anemia: Secondary | ICD-10-CM | POA: Diagnosis present

## 2024-03-03 DIAGNOSIS — H9190 Unspecified hearing loss, unspecified ear: Secondary | ICD-10-CM | POA: Diagnosis present

## 2024-03-03 DIAGNOSIS — Z043 Encounter for examination and observation following other accident: Secondary | ICD-10-CM | POA: Diagnosis not present

## 2024-03-03 DIAGNOSIS — W19XXXA Unspecified fall, initial encounter: Principal | ICD-10-CM

## 2024-03-03 DIAGNOSIS — Z86008 Personal history of in-situ neoplasm of other site: Secondary | ICD-10-CM

## 2024-03-03 DIAGNOSIS — E119 Type 2 diabetes mellitus without complications: Secondary | ICD-10-CM | POA: Diagnosis not present

## 2024-03-03 DIAGNOSIS — M5126 Other intervertebral disc displacement, lumbar region: Secondary | ICD-10-CM | POA: Diagnosis not present

## 2024-03-03 DIAGNOSIS — S22030A Wedge compression fracture of third thoracic vertebra, initial encounter for closed fracture: Secondary | ICD-10-CM | POA: Diagnosis not present

## 2024-03-03 DIAGNOSIS — R296 Repeated falls: Secondary | ICD-10-CM | POA: Diagnosis present

## 2024-03-03 DIAGNOSIS — I959 Hypotension, unspecified: Secondary | ICD-10-CM | POA: Diagnosis not present

## 2024-03-03 DIAGNOSIS — Z951 Presence of aortocoronary bypass graft: Secondary | ICD-10-CM

## 2024-03-03 DIAGNOSIS — K219 Gastro-esophageal reflux disease without esophagitis: Secondary | ICD-10-CM | POA: Diagnosis present

## 2024-03-03 DIAGNOSIS — K5730 Diverticulosis of large intestine without perforation or abscess without bleeding: Secondary | ICD-10-CM | POA: Diagnosis not present

## 2024-03-03 DIAGNOSIS — Z7982 Long term (current) use of aspirin: Secondary | ICD-10-CM

## 2024-03-03 DIAGNOSIS — D539 Nutritional anemia, unspecified: Secondary | ICD-10-CM | POA: Diagnosis not present

## 2024-03-03 DIAGNOSIS — E872 Acidosis, unspecified: Secondary | ICD-10-CM | POA: Diagnosis present

## 2024-03-03 DIAGNOSIS — M1990 Unspecified osteoarthritis, unspecified site: Secondary | ICD-10-CM | POA: Diagnosis not present

## 2024-03-03 DIAGNOSIS — M4802 Spinal stenosis, cervical region: Secondary | ICD-10-CM | POA: Diagnosis present

## 2024-03-03 DIAGNOSIS — M545 Low back pain, unspecified: Secondary | ICD-10-CM | POA: Diagnosis present

## 2024-03-03 DIAGNOSIS — M7732 Calcaneal spur, left foot: Secondary | ICD-10-CM | POA: Diagnosis not present

## 2024-03-03 DIAGNOSIS — M4712 Other spondylosis with myelopathy, cervical region: Secondary | ICD-10-CM | POA: Diagnosis not present

## 2024-03-03 DIAGNOSIS — Z884 Allergy status to anesthetic agent status: Secondary | ICD-10-CM

## 2024-03-03 DIAGNOSIS — M5459 Other low back pain: Secondary | ICD-10-CM | POA: Diagnosis not present

## 2024-03-03 DIAGNOSIS — E782 Mixed hyperlipidemia: Secondary | ICD-10-CM | POA: Diagnosis not present

## 2024-03-03 DIAGNOSIS — M5030 Other cervical disc degeneration, unspecified cervical region: Secondary | ICD-10-CM | POA: Diagnosis not present

## 2024-03-03 DIAGNOSIS — N952 Postmenopausal atrophic vaginitis: Secondary | ICD-10-CM | POA: Diagnosis not present

## 2024-03-03 DIAGNOSIS — M47812 Spondylosis without myelopathy or radiculopathy, cervical region: Secondary | ICD-10-CM | POA: Diagnosis not present

## 2024-03-03 DIAGNOSIS — R350 Frequency of micturition: Secondary | ICD-10-CM | POA: Diagnosis not present

## 2024-03-03 DIAGNOSIS — E876 Hypokalemia: Secondary | ICD-10-CM | POA: Diagnosis not present

## 2024-03-03 DIAGNOSIS — M7662 Achilles tendinitis, left leg: Secondary | ICD-10-CM | POA: Diagnosis not present

## 2024-03-03 DIAGNOSIS — I2510 Atherosclerotic heart disease of native coronary artery without angina pectoris: Secondary | ICD-10-CM | POA: Diagnosis present

## 2024-03-03 DIAGNOSIS — M479 Spondylosis, unspecified: Secondary | ICD-10-CM | POA: Diagnosis present

## 2024-03-03 DIAGNOSIS — Z886 Allergy status to analgesic agent status: Secondary | ICD-10-CM

## 2024-03-03 DIAGNOSIS — S22030D Wedge compression fracture of third thoracic vertebra, subsequent encounter for fracture with routine healing: Secondary | ICD-10-CM | POA: Diagnosis not present

## 2024-03-03 DIAGNOSIS — I252 Old myocardial infarction: Secondary | ICD-10-CM

## 2024-03-03 DIAGNOSIS — M25572 Pain in left ankle and joints of left foot: Secondary | ICD-10-CM | POA: Diagnosis not present

## 2024-03-03 DIAGNOSIS — R609 Edema, unspecified: Secondary | ICD-10-CM | POA: Diagnosis not present

## 2024-03-03 DIAGNOSIS — Z9049 Acquired absence of other specified parts of digestive tract: Secondary | ICD-10-CM

## 2024-03-03 LAB — URINALYSIS, ROUTINE W REFLEX MICROSCOPIC
Bilirubin Urine: NEGATIVE
Glucose, UA: NEGATIVE mg/dL
Ketones, ur: NEGATIVE mg/dL
Nitrite: NEGATIVE
Protein, ur: 30 mg/dL — AB
Specific Gravity, Urine: 1.011 (ref 1.005–1.030)
WBC, UA: >50 WBC/hpf (ref 0–5)
pH: 7 (ref 5.0–8.0)

## 2024-03-03 LAB — CBG MONITORING, ED: Glucose-Capillary: 110 mg/dL — ABNORMAL HIGH (ref 70–99)

## 2024-03-03 LAB — CBC
HCT: 24.1 % — ABNORMAL LOW (ref 36.0–46.0)
Hemoglobin: 7.3 g/dL — ABNORMAL LOW (ref 12.0–15.0)
MCH: 31.7 pg (ref 26.0–34.0)
MCHC: 30.3 g/dL (ref 30.0–36.0)
MCV: 104.8 fL — ABNORMAL HIGH (ref 80.0–100.0)
Platelets: 232 10*3/uL (ref 150–400)
RBC: 2.3 MIL/uL — ABNORMAL LOW (ref 3.87–5.11)
RDW: 17.9 % — ABNORMAL HIGH (ref 11.5–15.5)
WBC: 9.5 10*3/uL (ref 4.0–10.5)
nRBC: 0 % (ref 0.0–0.2)

## 2024-03-03 LAB — BASIC METABOLIC PANEL
Anion gap: 9 (ref 5–15)
BUN: 25 mg/dL — ABNORMAL HIGH (ref 8–23)
CO2: 20 mmol/L — ABNORMAL LOW (ref 22–32)
Calcium: 8.4 mg/dL — ABNORMAL LOW (ref 8.9–10.3)
Chloride: 104 mmol/L (ref 98–111)
Creatinine, Ser: 1.18 mg/dL — ABNORMAL HIGH (ref 0.44–1.00)
GFR, Estimated: 43 mL/min — ABNORMAL LOW (ref >60)
Glucose, Bld: 129 mg/dL — ABNORMAL HIGH (ref 70–99)
Potassium: 4 mmol/L (ref 3.5–5.1)
Sodium: 133 mmol/L — ABNORMAL LOW (ref 135–145)

## 2024-03-03 LAB — POC OCCULT BLOOD, ED: Fecal Occult Bld: NEGATIVE

## 2024-03-03 MED ORDER — SODIUM CHLORIDE 0.9 % IV BOLUS
500.0000 mL | Freq: Once | INTRAVENOUS | Status: AC
  Administered 2024-03-03: 500 mL via INTRAVENOUS

## 2024-03-03 MED ORDER — IOHEXOL 300 MG/ML  SOLN
100.0000 mL | Freq: Once | INTRAMUSCULAR | Status: AC | PRN
  Administered 2024-03-03: 100 mL via INTRAVENOUS

## 2024-03-03 MED ORDER — SODIUM CHLORIDE 0.9 % IV SOLN
1.0000 g | Freq: Once | INTRAVENOUS | Status: AC
  Administered 2024-03-04: 1 g via INTRAVENOUS
  Filled 2024-03-03: qty 10

## 2024-03-03 NOTE — ED Provider Notes (Signed)
 [place] EMERGENCY DEPARTMENT AT [facility] Provider Note   CSN: [id number] Arrival date & time: 03/03/24  1340     History  Chief Complaint  Patient presents with   Extremity Weakness    Multiple falls    Kathryn Carlson is a 89 y.o. female.   Extremity Weakness  Patient presents after recurrent falls.  Is at nursing home.  Patient here with family friend.  Discussed with patient's daughter on the phone.  Has had recent falls.  Hit face.  Has been seen in follow-up also.  Pain on left leg and cannot walk now because of it.  No new fall over the last couple days.  States she cannot walk on the leg because it hurts.    Past Medical History:  Diagnosis Date   BCC (basal cell carcinoma of skin) 01/09/2009   Lower Central Back (tx p bx)   CAD (coronary artery disease)    possible ant wall MI ('97) - cath & CABG x5   Exogenous obesity    GERD (gastroesophageal reflux disease)    History of nuclear stress test 09/2012   lexiscan; mild perfusion defect in apical anterior & apical region (infarct/scar) - no significant ischemia, low risk    History of total bilateral knee replacement    Hypertension    Hypothyroidism    Nodular basal cell carcinoma (BCC) 08/17/2017   Left Calf (tx p bx)   PVC's (premature ventricular contractions)    SCC (squamous cell carcinoma) [place] 01/29/2004   Right Forearm (Cx3,5FU)   SCCA (squamous cell carcinoma) of skin 03/13/2005   Mid Upper Back   SCCA (squamous cell carcinoma) of skin 09/07/2005   Left Elbow(Cx3,5FU)   SCCA (squamous cell carcinoma) of skin 01/25/2006   Left Brow(in situ) (Cx3,5FU)   SCCA (squamous cell carcinoma) of skin 04/27/2006   Inner Left Shoulder(Keratoacanthoma) (Exc.)   SCCA (squamous cell carcinoma) of skin 03/22/2007   Left Temple(in situ) and Bridge of Nose(in situ) (Cs3,5FU)   SCCA (squamous cell carcinoma) of skin 05/12/2007   Top of Scalp(Cx3,5FU)   SCCA (squamous cell carcinoma) of skin  01/28/2011   Right Upper Arm(in situ)   SCCA (squamous cell carcinoma) of skin 07/26/2012   Glabella, Inferior Tip (Cx3,5FU)   SCCA (squamous cell carcinoma) of skin 03/08/2013   Left Front Scalp(in situ) and Upper Nose(in situ) (Cx3,5FU)   SCCA (squamous cell carcinoma) of skin 07/24/2013   Right Lower Leg(Keratoacanthoma)   SCCA (squamous cell carcinoma) of skin 09/11/2015   Left Scalp(in situ) (Cx3,5FU)   SCCA (squamous cell carcinoma) of skin 01/15/2016   Left Forearm(in situ) and Left Temple(in situ) (tx p bx)   SCCA (squamous cell carcinoma) of skin 10/01/2016   Left Mid Forearm(in situ)(tx p bx) and Left Front Scalp(watch)   SCCA (squamous cell carcinoma) of skin 11/12/2016   Left Temple(Keratoacanthoma) (watch)   SCCA (squamous cell carcinoma) of skin 02/11/2017   Top Left Hand(Keratoacanthoma) (tx p bx)   Squamous cell carcinoma in situ (SCCIS) 11/18/1999   Above Left Outer Eyebrow   Squamous cell carcinoma of scalp    removed - Dr. Jorja Loa   Superficial basal cell carcinoma (BCC) 07/14/2004   Left Scapula (Cx3,5FU)    Home Medications Prior to Admission medications   Medication Sig Start Date End Date Taking? Authorizing Provider  acetaminophen (TYLENOL) 325 MG tablet Take 650 mg by mouth every 4 (four) hours as needed.    [provider]  acetaminophen (TYLENOL) 500 MG  tablet Take 500 mg by mouth at bedtime as needed (pain).    [provider]  amLODipine (NORVASC) 5 MG tablet Take 5 mg by mouth daily.    [provider]  Aspirin 81 MG CAPS Take 81 mg by mouth daily.    [provider]  atorvastatin (LIPITOR) 40 MG tablet TAKE 1 TABLET BY MOUTH EVERY DAY 09/21/23   Runell Gess, MD  cholecalciferol (VITAMIN D3) 25 MCG (1000 UNIT) tablet Take 1,000 Units by mouth daily.    [provider]  clopidogrel (PLAVIX) 75 MG tablet TAKE 1 TABLET BY MOUTH EVERY DAY 06/24/23   Runell Gess, MD  estradiol (ESTRACE) 0.1 MG/GM  vaginal cream Place 1 Applicatorful vaginally See admin instructions. Apply 1g vaginally in the evening on Monday, Wednesday, Friday.    [provider]  HYDROcodone-acetaminophen (NORCO/VICODIN) 5-325 MG tablet Take 0.5 tablets by mouth at bedtime. 02/03/24   [provider]  hydrocortisone 2.5 % cream Apply 1 Application topically at bedtime.    [provider]  isosorbide mononitrate (IMDUR) 60 MG 24 hr tablet TAKE 1 TABLET BY MOUTH EVERY DAY 06/28/23   Runell Gess, MD  levothyroxine (SYNTHROID) 75 MCG tablet Take 75 mcg by mouth daily before breakfast.    [provider]  metoprolol succinate (TOPROL-XL) 100 MG 24 hr tablet TAKE 1 TABLET BY MOUTH EVERY DAY WITH OR IMMEDIATELY FOLLOWING A MEAL 09/21/23   Runell Gess, MD  Multiple Vitamin (MULTIVITAMIN WITH MINERALS) TABS tablet Take 1 tablet by mouth daily.    [provider]  mupirocin ointment (BACTROBAN) 2 % Apply 1 Application topically daily as needed (irritation).    [provider]  nitroGLYCERIN (NITROSTAT) 0.4 MG SL tablet Place 0.4 mg under the tongue every 5 (five) minutes as needed for chest pain. 08/12/23   [provider]  potassium chloride (KLOR-CON) 10 MEQ tablet Take 10 mEq by mouth daily as needed.    [provider]      Allergies    Mobic [meloxicam] and Novocain [procaine]    Review of Systems   Review of Systems  Musculoskeletal:  Positive for extremity weakness.    Physical Exam Updated Vital Signs BP (!) 123/52   Pulse 66   Temp 98 F (36.7 C)   Resp 16   Ht 5\' 6"  (1.676 m)   Wt 70 kg   SpO2 97%   BMI 24.91 kg/m  Physical Exam Vitals and nursing note reviewed.  HENT:     Head:     Comments: Ecchymosis to left periorbital area, forehead, and face. Cardiovascular:     Rate and Rhythm: Regular rhythm.  Chest:     Chest wall: No tenderness.  Abdominal:     Tenderness: There is no abdominal tenderness.  Musculoskeletal:         General: Tenderness and signs of injury present.     Cervical back: Neck supple.     Comments: Tenderness to left ankle.  Decreased range of motion.  Good straight leg raise bilaterally.  Skin:    Capillary Refill: Capillary refill takes less than 2 seconds.  Neurological:     Mental Status: She is alert. Mental status is at baseline.     Comments: Patient awake.  May have some mild confusion but able to recognize family members and give history.  Moves upper extremities equally.  Good straight leg raise bilaterally.  Finger-nose intact bilaterally.  Sensation grossly intact over lower extremities.  ED Results / Procedures / Treatments   Labs (all labs ordered are listed, but only abnormal results are displayed) Labs Reviewed  CBC - Abnormal; Notable for the following components:      Result Value   RBC 2.30 (*)    Hemoglobin 7.3 (*)    HCT 24.1 (*)    MCV 104.8 (*)    RDW 17.9 (*)    All other components within normal limits  CBG MONITORING, ED - Abnormal; Notable for the following components:   Glucose-Capillary 110 (*)    All other components within normal limits  BASIC METABOLIC PANEL  URINALYSIS, ROUTINE W REFLEX MICROSCOPIC    EKG EKG Interpretation Date/Time:  Friday March 03 2024 14:32:19 EST Ventricular Rate:  76 PR Interval:  62 QRS Duration:  119 QT Interval:  342 QTC Calculation: 385 R Axis:   43  Text Interpretation: Sinus rhythm Multiple ventricular premature complexes Short PR interval Incomplete left bundle branch block   Nonspecific lateral and inferior ST-T wave changes. Confirmed by Benjiman Core [phone]) on 03/03/2024 3:53:08 PM  Radiology No results found.  Procedures Procedures    Medications Ordered in ED Medications - No data to display  ED Course/ Medical Decision Making/ A&P                                 Medical Decision Making Amount and/or Complexity of Data Reviewed Radiology: ordered.  Risk Prescription drug  management.   Patient multiple frequent falls.  Pain in left ankle.  Unable to ambulate.  Not lateralizing on the neurologic exam.  Also has finger-nose intact bilaterally.  Good strength in extremities.  Stroke felt less likely.  X-ray done of left ankle due to pain.  Basic blood work done.  Will check urinalysis also.  Does have anemia with a hemoglobin down to 7.3.  Will check Hemoccult.  Hemoccult negative.  Urine does show some dehydration.  Does have bruise on left buttock.  With no source of bleeding and trauma will get CT scan abdomen pelvis to look for occult blood.  Less likely felt to have large hematoma in buttocks.  With anemia will likely require admission to hospital.  CT scan does show buttock hematoma.  Potentially could be some of the anemia.  However potential cervical spine air.  Potential epidural abscess.  MRI recommended.  Will order this.  Care turned over to Dr Elayne Snare         Final Clinical Impression(s) / ED Diagnoses Final diagnoses:  None    Rx / DC Orders ED Discharge Orders     None         Benjiman Core, MD 03/03/24 2327

## 2024-03-03 NOTE — ED Triage Notes (Signed)
 Patient presents to ER from [facility], per EMS report, patient has had multiple falls with most recent being on 02/27/24. Patient was seen at [facility] on 3/2 post fall and discharged back to facility. Per EMS, the NP at facility wanted patient to have an MRI because her left leg is still bothering her, so sent patient to ER. Patient is A&Ox4, hard of hearing.

## 2024-03-03 NOTE — ED Provider Triage Note (Signed)
 Emergency Medicine Provider Triage Evaluation Note  Kathryn Carlson , a 89 y.o. female  was evaluated in triage.  Pt complains of falls. Recurrent falls in the past week.  Seen in the ER recently for same and was discharged.  Her PCP voice concern of potential stroke because pt can't walk and having L leg weakness.  Pt endorse pain to L leg including ankle along with lower back pain.  Denies numbness  Review of Systems  Positive: As above Negative: As above  Physical Exam  BP (!) 123/52   Pulse 66   Temp 98 F (36.7 C)   Resp 16   Ht 5\' 6"  (1.676 m)   Wt 70 kg   SpO2 97%   BMI 24.91 kg/m  Gen:   Awake, no distress   Resp:  Normal effort  MSK:   Moves extremities without difficulty  Other:    Medical Decision Making  Medically screening exam initiated at 2:23 PM.  Appropriate orders placed.  MADIE CAHN was informed that the remainder of the evaluation will be completed by another provider, this initial triage assessment does not replace that evaluation, and the importance of remaining in the ED until their evaluation is complete.     Fayrene Helper, PA-C 03/03/24 1424

## 2024-03-04 ENCOUNTER — Emergency Department (HOSPITAL_COMMUNITY)

## 2024-03-04 DIAGNOSIS — R531 Weakness: Secondary | ICD-10-CM

## 2024-03-04 DIAGNOSIS — Z79899 Other long term (current) drug therapy: Secondary | ICD-10-CM | POA: Diagnosis not present

## 2024-03-04 DIAGNOSIS — D631 Anemia in chronic kidney disease: Secondary | ICD-10-CM | POA: Diagnosis not present

## 2024-03-04 DIAGNOSIS — E8809 Other disorders of plasma-protein metabolism, not elsewhere classified: Secondary | ICD-10-CM | POA: Diagnosis not present

## 2024-03-04 DIAGNOSIS — M5030 Other cervical disc degeneration, unspecified cervical region: Secondary | ICD-10-CM | POA: Diagnosis not present

## 2024-03-04 DIAGNOSIS — Z7902 Long term (current) use of antithrombotics/antiplatelets: Secondary | ICD-10-CM | POA: Diagnosis not present

## 2024-03-04 DIAGNOSIS — E871 Hypo-osmolality and hyponatremia: Secondary | ICD-10-CM | POA: Diagnosis present

## 2024-03-04 DIAGNOSIS — Y92129 Unspecified place in nursing home as the place of occurrence of the external cause: Secondary | ICD-10-CM | POA: Diagnosis not present

## 2024-03-04 DIAGNOSIS — W1830XA Fall on same level, unspecified, initial encounter: Secondary | ICD-10-CM | POA: Diagnosis not present

## 2024-03-04 DIAGNOSIS — I2510 Atherosclerotic heart disease of native coronary artery without angina pectoris: Secondary | ICD-10-CM | POA: Diagnosis present

## 2024-03-04 DIAGNOSIS — N390 Urinary tract infection, site not specified: Secondary | ICD-10-CM | POA: Diagnosis present

## 2024-03-04 DIAGNOSIS — D62 Acute posthemorrhagic anemia: Secondary | ICD-10-CM | POA: Diagnosis not present

## 2024-03-04 DIAGNOSIS — E039 Hypothyroidism, unspecified: Secondary | ICD-10-CM | POA: Diagnosis not present

## 2024-03-04 DIAGNOSIS — E872 Acidosis, unspecified: Secondary | ICD-10-CM | POA: Diagnosis not present

## 2024-03-04 DIAGNOSIS — T148XXA Other injury of unspecified body region, initial encounter: Secondary | ICD-10-CM | POA: Diagnosis present

## 2024-03-04 DIAGNOSIS — E1122 Type 2 diabetes mellitus with diabetic chronic kidney disease: Secondary | ICD-10-CM | POA: Diagnosis not present

## 2024-03-04 DIAGNOSIS — Z85828 Personal history of other malignant neoplasm of skin: Secondary | ICD-10-CM | POA: Diagnosis not present

## 2024-03-04 DIAGNOSIS — Z79890 Hormone replacement therapy (postmenopausal): Secondary | ICD-10-CM | POA: Diagnosis not present

## 2024-03-04 DIAGNOSIS — I350 Nonrheumatic aortic (valve) stenosis: Secondary | ICD-10-CM | POA: Diagnosis not present

## 2024-03-04 DIAGNOSIS — I252 Old myocardial infarction: Secondary | ICD-10-CM | POA: Diagnosis not present

## 2024-03-04 DIAGNOSIS — M4801 Spinal stenosis, occipito-atlanto-axial region: Secondary | ICD-10-CM | POA: Diagnosis not present

## 2024-03-04 DIAGNOSIS — N3000 Acute cystitis without hematuria: Secondary | ICD-10-CM | POA: Diagnosis not present

## 2024-03-04 DIAGNOSIS — Z8249 Family history of ischemic heart disease and other diseases of the circulatory system: Secondary | ICD-10-CM | POA: Diagnosis not present

## 2024-03-04 DIAGNOSIS — Z043 Encounter for examination and observation following other accident: Secondary | ICD-10-CM | POA: Diagnosis not present

## 2024-03-04 DIAGNOSIS — D539 Nutritional anemia, unspecified: Secondary | ICD-10-CM | POA: Diagnosis present

## 2024-03-04 DIAGNOSIS — Z7982 Long term (current) use of aspirin: Secondary | ICD-10-CM | POA: Diagnosis not present

## 2024-03-04 DIAGNOSIS — N1832 Chronic kidney disease, stage 3b: Secondary | ICD-10-CM | POA: Diagnosis not present

## 2024-03-04 DIAGNOSIS — G992 Myelopathy in diseases classified elsewhere: Secondary | ICD-10-CM | POA: Diagnosis not present

## 2024-03-04 DIAGNOSIS — M4802 Spinal stenosis, cervical region: Secondary | ICD-10-CM | POA: Diagnosis not present

## 2024-03-04 DIAGNOSIS — E785 Hyperlipidemia, unspecified: Secondary | ICD-10-CM | POA: Diagnosis not present

## 2024-03-04 DIAGNOSIS — I129 Hypertensive chronic kidney disease with stage 1 through stage 4 chronic kidney disease, or unspecified chronic kidney disease: Secondary | ICD-10-CM | POA: Diagnosis not present

## 2024-03-04 DIAGNOSIS — S22030A Wedge compression fracture of third thoracic vertebra, initial encounter for closed fracture: Secondary | ICD-10-CM | POA: Diagnosis not present

## 2024-03-04 LAB — TYPE AND SCREEN
ABO/RH(D): A POS
Antibody Screen: NEGATIVE

## 2024-03-04 LAB — GLUCOSE, CAPILLARY
Glucose-Capillary: 109 mg/dL — ABNORMAL HIGH (ref 70–99)
Glucose-Capillary: 137 mg/dL — ABNORMAL HIGH (ref 70–99)
Glucose-Capillary: 116 mg/dL — ABNORMAL HIGH (ref 70–99)

## 2024-03-04 LAB — HEMOGLOBIN AND HEMATOCRIT, BLOOD
HCT: 21.9 % — ABNORMAL LOW (ref 36.0–46.0)
HCT: 27.6 % — ABNORMAL LOW (ref 36.0–46.0)
Hemoglobin: 6.9 g/dL — CL (ref 12.0–15.0)
Hemoglobin: 8.8 g/dL — ABNORMAL LOW (ref 12.0–15.0)

## 2024-03-04 LAB — PREPARE RBC (CROSSMATCH)

## 2024-03-04 LAB — PROTIME-INR
INR: 1.2 (ref 0.8–1.2)
Prothrombin Time: 15 s (ref 11.4–15.2)

## 2024-03-04 LAB — IRON AND TIBC
Iron: 30 ug/dL (ref 28–170)
Saturation Ratios: 13 % (ref 10.4–31.8)
TIBC: 230 ug/dL — ABNORMAL LOW (ref 250–450)
UIBC: 200 ug/dL

## 2024-03-04 LAB — FERRITIN: Ferritin: 102 ng/mL (ref 11–307)

## 2024-03-04 LAB — MRSA NEXT GEN BY PCR, NASAL: MRSA by PCR Next Gen: DETECTED — AB

## 2024-03-04 LAB — CBG MONITORING, ED: Glucose-Capillary: 125 mg/dL — ABNORMAL HIGH (ref 70–99)

## 2024-03-04 LAB — APTT: aPTT: 31 s (ref 24–36)

## 2024-03-04 MED ORDER — HYDROCODONE-ACETAMINOPHEN 5-325 MG PO TABS
0.5000 | ORAL_TABLET | Freq: Every day | ORAL | Status: DC
  Administered 2024-03-04 – 2024-03-07 (×4): 0.5 via ORAL
  Filled 2024-03-04 (×4): qty 1

## 2024-03-04 MED ORDER — SODIUM CHLORIDE 0.9% IV SOLUTION: Freq: Once | INTRAVENOUS | Status: AC

## 2024-03-04 MED ORDER — SODIUM CHLORIDE 0.9 % IV SOLN
1.0000 g | INTRAVENOUS | Status: DC
  Administered 2024-03-04 – 2024-03-07 (×4): 1 g via INTRAVENOUS
  Filled 2024-03-04 (×4): qty 10

## 2024-03-04 MED ORDER — ASPIRIN 81 MG PO TBEC
81.0000 mg | DELAYED_RELEASE_TABLET | Freq: Every day | ORAL | Status: DC
  Administered 2024-03-05 – 2024-03-08 (×4): 81 mg via ORAL
  Filled 2024-03-04 (×5): qty 1

## 2024-03-04 MED ORDER — MORPHINE SULFATE (PF) 2 MG/ML IV SOLN
2.0000 mg | Freq: Once | INTRAVENOUS | Status: AC
  Administered 2024-03-04: 2 mg via INTRAVENOUS
  Filled 2024-03-04: qty 1

## 2024-03-04 MED ORDER — ATORVASTATIN CALCIUM 40 MG PO TABS
40.0000 mg | ORAL_TABLET | Freq: Every day | ORAL | Status: DC
  Administered 2024-03-04 – 2024-03-08 (×5): 40 mg via ORAL
  Filled 2024-03-04 (×5): qty 1

## 2024-03-04 MED ORDER — POLYETHYLENE GLYCOL 3350 17 G PO PACK
17.0000 g | PACK | ORAL | Status: DC
  Administered 2024-03-08: 17 g via ORAL
  Filled 2024-03-04: qty 1

## 2024-03-04 MED ORDER — INSULIN ASPART 100 UNIT/ML IJ SOLN
0.0000 [IU] | Freq: Three times a day (TID) | INTRAMUSCULAR | Status: DC
  Filled 2024-03-04: qty 0.09

## 2024-03-04 MED ORDER — FERROUS SULFATE 325 (65 FE) MG PO TABS
325.0000 mg | ORAL_TABLET | ORAL | Status: DC
  Administered 2024-03-06 – 2024-03-08 (×4): 325 mg via ORAL
  Filled 2024-03-04 (×4): qty 1

## 2024-03-04 MED ORDER — ACETAMINOPHEN 650 MG RE SUPP: 650.0000 mg | Freq: Four times a day (QID) | RECTAL | Status: DC | PRN

## 2024-03-04 MED ORDER — GADOBUTROL 1 MMOL/ML IV SOLN
7.0000 mL | Freq: Once | INTRAVENOUS | Status: AC | PRN
  Administered 2024-03-04: 7 mL via INTRAVENOUS

## 2024-03-04 MED ORDER — LEVOTHYROXINE SODIUM 50 MCG PO TABS
75.0000 ug | ORAL_TABLET | Freq: Every day | ORAL | Status: DC
  Administered 2024-03-05 – 2024-03-08 (×4): 75 ug via ORAL
  Filled 2024-03-04 (×4): qty 1

## 2024-03-04 MED ORDER — ACETAMINOPHEN 325 MG PO TABS
650.0000 mg | ORAL_TABLET | Freq: Four times a day (QID) | ORAL | Status: DC | PRN
  Administered 2024-03-05 – 2024-03-08 (×4): 650 mg via ORAL
  Filled 2024-03-04 (×4): qty 2

## 2024-03-04 NOTE — H&P (Signed)
 History and Physical    Patient: Kathryn Carlson:[id number] DOB: Apr 28, 1927 DOA: 03/03/2024 DOS: the patient was seen and examined on 03/04/2024 PCP: Mast, Man X, NP  Patient coming from: Home  Chief Complaint:  Chief Complaint  Patient presents with   Extremity Weakness    Multiple falls   HPI: Kathryn Carlson is a 89 y.o. female with medical history significant of basal cell carcinoma, CAD, CABG, history of MI, hypertension, GERD, hypothyroidism, nodular basal cell carcinoma, multiple squamous cell carcinoma of the skin who was brought to the emergency department due to having lower extremity weakness resulting in recent multiple falls who was seen in the emergency department this past Sunday due to having a fall developing a hematoma in the left periorbital area following a fall at her facility.  Early morning today, she presented with a friend to the emergency department with complaints of LLE weakness with significant difficulty bearing weight on it.  She denied fever, chills, rhinorrhea, sore throat, wheezing or hemoptysis.  She has had occasional postural dizziness, no chest pain, palpitations, diaphoresis, PND, orthopnea or recent pitting edema of the lower extremities.  No abdominal pain, nausea, emesis, diarrhea, constipation, melena or hematochezia.  Positive frequency, no flank pain, dysuria or hematuria.  No polyuria, polydipsia, polyphagia or blurred vision.   Lab work: Urinalysis was cloudy with small hemoglobin and moderate leukocyte esterase.  There was 30 mg/dL of protein.  Microscopic examination showed greater than 50 WBC, few bacteria and positive WBC clumps.  Fecal occult blood was negative.  CBC showed a white count of 9.5, hemoglobin 7.3 g/dL with an MCV of 811.9 fL and platelets 232.  BMP showed a sodium 133, potassium 4.0, chloride 104 and CO2 20 mmol/L with an anion gap of 9.  Glucose 129, BUN 25, creatinine 1.18 and calcium 8.4 mg/dL.  Imaging: CT maxillofacial no  acute fracture or acute injury to the facial bones.  Near complete resolution of previous left supraorbital scalp hematoma.  Left ankle x-ray with no fracture, lumbar spine x-ray technically difficult, but no obvious fracture or subluxation.  Multilevel DDD and dextroscoliosis.  CT abdomen/pelvis with contrast showing subcutaneous hematoma and contusions over the left hip and likely small hematoma in the distal gluteal muscle on the left, but no acute fracture was seen.  There is trace left pleural effusion with atelectasis at the lung bases.  Diverticulosis without diverticulitis.  Aortic atherosclerosis.  CT thoracic spine showed a minimally depressed wedge compression fracture of T3.  There is small amount of epidural gas with adjacent hyperdensity within the lower cervical spine, unchanged since 02/27/2024.  While this is somewhat nonspecific, epidural infection can cause this appearance.  MRI with and without contrast of the cervical spine recommended.  MRI cervical spine with and without contrast was suboptimal, but allowing for that is just showing advanced degenerative etiology.  Please see images supravaginal report for further details.   ED course: Initial vital signs were temperature 98 F, pulse 66, respirations 16, BP 123/52 mmHg O2 sat 92% on room air.  The patient received ceftriaxone 1 g IVPB, morphine 2 mg IVP and 500 mL of normal saline bolus.  Review of Systems: As mentioned in the history of present illness. All other systems reviewed and are negative.  Past Medical History:  Diagnosis Date   BCC (basal cell carcinoma of skin) 01/09/2009   Lower Central Back (tx p bx)   CAD (coronary artery disease)    possible ant wall MI ('97) -  cath & CABG x5   Exogenous obesity    GERD (gastroesophageal reflux disease)    History of nuclear stress test 09/2012   lexiscan; mild perfusion defect in apical anterior & apical region (infarct/scar) - no significant ischemia, low risk    History of  total bilateral knee replacement    Hypertension    Hypothyroidism    Nodular basal cell carcinoma (BCC) 08/17/2017   Left Calf (tx p bx)   PVC's (premature ventricular contractions)    SCC (squamous cell carcinoma) [place] 01/29/2004   Right Forearm (Cx3,5FU)   SCCA (squamous cell carcinoma) of skin 03/13/2005   Mid Upper Back   SCCA (squamous cell carcinoma) of skin 09/07/2005   Left Elbow(Cx3,5FU)   SCCA (squamous cell carcinoma) of skin 01/25/2006   Left Brow(in situ) (Cx3,5FU)   SCCA (squamous cell carcinoma) of skin 04/27/2006   Inner Left Shoulder(Keratoacanthoma) (Exc.)   SCCA (squamous cell carcinoma) of skin 03/22/2007   Left Temple(in situ) and Bridge of Nose(in situ) (Cs3,5FU)   SCCA (squamous cell carcinoma) of skin 05/12/2007   Top of Scalp(Cx3,5FU)   SCCA (squamous cell carcinoma) of skin 01/28/2011   Right Upper Arm(in situ)   SCCA (squamous cell carcinoma) of skin 07/26/2012   Glabella, Inferior Tip (Cx3,5FU)   SCCA (squamous cell carcinoma) of skin 03/08/2013   Left Front Scalp(in situ) and Upper Nose(in situ) (Cx3,5FU)   SCCA (squamous cell carcinoma) of skin 07/24/2013   Right Lower Leg(Keratoacanthoma)   SCCA (squamous cell carcinoma) of skin 09/11/2015   Left Scalp(in situ) (Cx3,5FU)   SCCA (squamous cell carcinoma) of skin 01/15/2016   Left Forearm(in situ) and Left Temple(in situ) (tx p bx)   SCCA (squamous cell carcinoma) of skin 10/01/2016   Left Mid Forearm(in situ)(tx p bx) and Left Front Scalp(watch)   SCCA (squamous cell carcinoma) of skin 11/12/2016   Left Temple(Keratoacanthoma) (watch)   SCCA (squamous cell carcinoma) of skin 02/11/2017   Top Left Hand(Keratoacanthoma) (tx p bx)   Squamous cell carcinoma in situ (SCCIS) 11/18/1999   Above Left Outer Eyebrow   Squamous cell carcinoma of scalp    removed - Dr. Jorja Loa   Superficial basal cell carcinoma (BCC) 07/14/2004   Left Scapula (Cx3,5FU)   Past Surgical History:  Procedure Laterality  Date   ABDOMINAL HYSTERECTOMY  1984   APPENDECTOMY  1941   Carotid Doppler  12/07/2012   R & L ICA - 0-49% diameter reduction   CORONARY ARTERY BYPASS GRAFT  09/1996   cath & CABG x5 LIMA-LAD, SVG-sequential OD & OM, SVG sequential to PDA & PLA (Dr. Donata Clay)   REPLACEMENT TOTAL KNEE Bilateral 2001 & 2003   TONSILLECTOMY     TRANSTHORACIC ECHOCARDIOGRAM  08/23/2013   EF 50-55%, LV cavity size mod reduced, mild LVH, mild conc hypertrophy; mild AV stenosis & mild regurg; mild MR - ordered for murmur   Social History:  reports that she has never smoked. She has never used smokeless tobacco. She reports current alcohol use. No history on file for drug use.  Allergies  Allergen Reactions   Mobic [Meloxicam] Other (See Comments)    Unknown reaction   Novocain [Procaine] Other (See Comments)    Unknown reaction    Family History  Problem Relation Age of Onset   Heart attack Mother    Heart attack Father    Cancer Sister    Diabetes Sister    Heart Problems Sister     Prior to Admission medications   Medication  Sig Start Date End Date Taking? Authorizing Provider  acetaminophen (TYLENOL) 500 MG tablet Take 500 mg by mouth at bedtime as needed (pain).   Yes [provider]  amLODipine (NORVASC) 5 MG tablet Take 5 mg by mouth daily.   Yes [provider]  Aspirin 81 MG CAPS Take 81 mg by mouth daily.   Yes [provider]  atorvastatin (LIPITOR) 40 MG tablet TAKE 1 TABLET BY MOUTH EVERY DAY 09/21/23  Yes Runell Gess, MD  cholecalciferol (VITAMIN D3) 25 MCG (1000 UNIT) tablet Take 1,000 Units by mouth daily.   Yes [provider]  estradiol (ESTRACE) 0.1 MG/GM vaginal cream Place 1 Applicatorful vaginally See admin instructions. Apply 1g vaginally in the evening on Monday, Wednesday, Friday.   Yes [provider]  ferrous sulfate 325 (65 FE) MG EC tablet Take 325 mg by mouth See admin instructions. Give 325mg  Three times a day by mouth  with meals every Mon, Wed, Friday   Yes [provider]  HYDROcodone-acetaminophen (NORCO/VICODIN) 5-325 MG tablet Take 0.5 tablets by mouth at bedtime. 02/03/24  Yes [provider]  hydrocortisone 2.5 % cream Apply 1 Application topically at bedtime.   Yes [provider]  isosorbide mononitrate (IMDUR) 60 MG 24 hr tablet TAKE 1 TABLET BY MOUTH EVERY DAY 06/28/23  Yes Runell Gess, MD  levothyroxine (SYNTHROID) 75 MCG tablet Take 75 mcg by mouth daily before breakfast.   Yes [provider]  lidocaine 4 % Place 1 patch onto the skin daily. Apply patch at 0800 and remove patch at at 2000   Yes [provider]  metoprolol succinate (TOPROL-XL) 100 MG 24 hr tablet TAKE 1 TABLET BY MOUTH EVERY DAY WITH OR IMMEDIATELY FOLLOWING A MEAL 09/21/23  Yes Runell Gess, MD  Multiple Vitamin (MULTIVITAMIN WITH MINERALS) TABS tablet Take 1 tablet by mouth daily.   Yes [provider]  mupirocin ointment (BACTROBAN) 2 % Apply 1 Application topically daily as needed (irritation).   Yes [provider]  nitroGLYCERIN (NITROSTAT) 0.4 MG SL tablet Place 0.4 mg under the tongue every 5 (five) minutes as needed for chest pain. 08/12/23  Yes [provider]  polyethylene glycol (MIRALAX / GLYCOLAX) 17 g packet Take 17 g by mouth See admin instructions. Every Mon, Wed, Friday for constipation. Mix with 4 ounces of fluid.   Yes [provider]  potassium chloride (KLOR-CON) 10 MEQ tablet Take 10 mEq by mouth daily as needed.   Yes [provider]  acetaminophen (TYLENOL) 325 MG tablet Take 650 mg by mouth every 4 (four) hours as needed. Patient not taking: Reported on 03/04/2024    [provider]  clopidogrel (PLAVIX) 75 MG tablet TAKE 1 TABLET BY MOUTH EVERY DAY Patient not taking: Reported on 03/04/2024 06/24/23   Runell Gess, MD    Physical Exam: Vitals:   03/04/24 0014 03/04/24 0300 03/04/24 0500 03/04/24 0650   BP: 125/71 129/64 (!) 116/54   Pulse: 84 81 78   Resp: 17 (!) 25 18   Temp: 97.8 F (36.6 C) 98.5 F (36.9 C)  97.7 F (36.5 C)  TempSrc: Oral   Oral  SpO2: 95% 97% 95%   Weight:      Height:       Physical Exam Vitals and nursing note reviewed.  Constitutional:      General: She is awake. She is not in acute distress.    Appearance: Normal appearance. She is ill-appearing.  HENT:     Head: Normocephalic.     Nose: No rhinorrhea.     Mouth/Throat:     Mouth: Mucous membranes are moist.  Eyes:     General: No scleral icterus.    Pupils: Pupils are equal, round, and reactive to light.  Neck:     Vascular: No JVD.  Cardiovascular:     Rate and Rhythm: Normal rate and regular rhythm.     Heart sounds: S1 normal and S2 normal.  Pulmonary:     Effort: Pulmonary effort is normal.     Breath sounds: Wheezing present.  Abdominal:     General: Bowel sounds are normal.     Palpations: Abdomen is soft.     Tenderness: There is no abdominal tenderness.  Musculoskeletal:     Cervical back: Neck supple.     Right lower leg: No edema.     Left lower leg: No edema.  Skin:    General: Skin is warm and dry.  Neurological:     General: No focal deficit present.     Mental Status: She is alert and oriented to person, place, and time.  Psychiatric:        Mood and Affect: Mood normal.        Behavior: Behavior normal. Behavior is cooperative.     Data Reviewed:  Results are pending, will review when available.  Assessment and Plan: Principal Problem:   Generalized weakness In the setting of:   Acute UTI (urinary tract infection) Admit to telemetry/inpatient. Continue supplemental oxygen. Scheduled and as needed bronchodilators. Continue ceftriaxone 1 g IVPB daily. Follow urine culture and sensitivity. Follow-up CBC and chemistry in the morning.  Active Problems:   Hyponatremia Minimally decreased. Unknown clinical significance. Recheck sodium level in AM.     Gluteal hematoma  Superimposed on:   Macrocytic anemia Leading to:   Acute on chronic blood loss anemia Negative occult blood in stool. Transfuse 1 unit of PRBC. Follow-up hematocrit and hemoglobin. Transfuse further as needed. No longer on clopidogrel.    HTN (hypertension) Soft BP readings. Hold antihypertensives. Continue BP monitoring.    CAD (coronary artery disease)  Status post CABG. Continue statin. Resume beta-blocker and CCB tomorrow.    Dyslipidemia   Aortic stenosis, mild Continue atorvastatin 40 mg p.o. daily.    Hypothyroidism Continue levothyroxine 75 mcg p.o. daily.    Type 2 diabetes, diet controlled (HCC) Carbohydrate modified diet. CBG monitoring with RI SS. Check hemoglobin A1c.    Hypocalcemia Recheck calcium with albumin level in AM.     Advance Care Planning:   Code Status: Full Code   Consults:   Family Communication:   Severity of Illness: The appropriate patient status for this patient is INPATIENT. Inpatient status is judged to be reasonable and necessary in order to provide the required intensity of service to ensure the patient's safety. The patient's presenting symptoms, physical exam findings, and initial radiographic and laboratory data in the context of their chronic comorbidities is felt to place them at high risk for further clinical deterioration. Furthermore, it is not anticipated that the patient will be medically stable for discharge from the hospital within 2 midnights of admission.   * I certify that at the point of admission it is my clinical judgment that the patient will require inpatient hospital care spanning beyond 2 midnights from the point of admission due to high intensity of service, high risk for further deterioration and high frequency of surveillance required.*  Author:  Bobette Mo, MD 03/04/2024 7:48 AM  For on call review [email or web address].   This document was prepared using Dragon voice recognition  software and may contain some unintended transcription errors.

## 2024-03-04 NOTE — Plan of Care (Signed)

## 2024-03-04 NOTE — ED Provider Notes (Signed)
  Physical Exam  BP (!) 116/54   Pulse 78   Temp 98.5 F (36.9 C)   Resp 18   Ht 5\' 6"  (1.676 m)   Wt 70 kg   SpO2 95%   BMI 24.91 kg/m   Physical Exam Vitals and nursing note reviewed.  HENT:     Head: Normocephalic and atraumatic.  Eyes:     Pupils: Pupils are equal, round, and reactive to light.  Cardiovascular:     Rate and Rhythm: Normal rate and regular rhythm.  Pulmonary:     Effort: Pulmonary effort is normal.     Breath sounds: Normal breath sounds.  Abdominal:     Palpations: Abdomen is soft.     Tenderness: There is no abdominal tenderness.  Skin:    General: Skin is warm and dry.  Neurological:     Mental Status: She is alert.  Psychiatric:        Mood and Affect: Mood normal.     Procedures  Procedures  ED Course / MDM   Clinical Course as of 03/04/24 0602  Sat Mar 04, 2024  0601 MRI shows no acute traumatic findings but many degenerative changes in the cervical spine.  She has received antibiotics for UTI.  Culture pending.  Discussed with admitting hospitalist who accepts patient for admission [MP]    Clinical Course User Index [MP] Royanne Foots, DO   Medical Decision Making I, Estelle June DO, have assumed care of this patient from the previous provider pending MRI and admission  Amount and/or Complexity of Data Reviewed Labs: ordered. Radiology: ordered.  Risk Prescription drug management. Decision regarding hospitalization.          Royanne Foots, DO 03/04/24 0602

## 2024-03-04 NOTE — ED Notes (Signed)
 Patient transported to MRI

## 2024-03-04 NOTE — Progress Notes (Signed)
  Carryover admission to the Day Admitter.  I discussed this case with the EDP, Dr. Estelle June.  Per these discussions:   This is a 89 year old female who presents from skilled nursing facility for evaluation of 1 week of generalized weakness associated with multiple ground-level mechanical falls.  Not associate any loss of consciousness, but did hit her head on at least 1 occasion, and is on Plavix as an outpatient.  Heart rate and blood pressure are reported to be stable.  Her hemoglobin is reported to be slightly lower than baseline.   CT reportedly shows no evidence of acute intracranial process, including no evidence of intracranial hemorrhage.  CT abdomen/pelvis showed no evidence of acute intra-abdominal or intrapelvic process, including no evidence of intra-abdominal bleed, but did show evidence of a small subcutaneous hematoma over the left gluteal region.   CT imaging of the spine was reportedly associate with some equivocal findings, prompting MRI of the cervical spine which showed no evidence of acute cervical pathology, noting no evidence of acute cervical fracture or any evidence of subluxation injury.   Additional labs are reported to be notable for urinary tract infection, for which she has received a dose of Rocephin.  Type and screen has been completed.  Has not been started on PRBC transfusion.  I have placed an order for observation to med/tele for further evaluation management of generalized weakness associated with frequent mechanical falls, acute anemia, acute cystitis.  I have placed some additional preliminary admit orders via the adult multi-morbid admission order set. I have also ordered fall precautions, iron studies, PTT, INR, and repeat hemoglobin level to be checked at 9 AM.  Will defer additional antibiotic selection to the admitting hospitalist.    Newton Pigg, DO Hospitalist

## 2024-03-05 DIAGNOSIS — N390 Urinary tract infection, site not specified: Secondary | ICD-10-CM

## 2024-03-05 DIAGNOSIS — D631 Anemia in chronic kidney disease: Secondary | ICD-10-CM | POA: Diagnosis present

## 2024-03-05 DIAGNOSIS — I2510 Atherosclerotic heart disease of native coronary artery without angina pectoris: Secondary | ICD-10-CM | POA: Diagnosis present

## 2024-03-05 DIAGNOSIS — Z7982 Long term (current) use of aspirin: Secondary | ICD-10-CM | POA: Diagnosis not present

## 2024-03-05 DIAGNOSIS — R609 Edema, unspecified: Secondary | ICD-10-CM | POA: Diagnosis not present

## 2024-03-05 DIAGNOSIS — Z79899 Other long term (current) drug therapy: Secondary | ICD-10-CM | POA: Diagnosis not present

## 2024-03-05 DIAGNOSIS — I129 Hypertensive chronic kidney disease with stage 1 through stage 4 chronic kidney disease, or unspecified chronic kidney disease: Secondary | ICD-10-CM | POA: Diagnosis present

## 2024-03-05 DIAGNOSIS — W1830XA Fall on same level, unspecified, initial encounter: Secondary | ICD-10-CM | POA: Diagnosis present

## 2024-03-05 DIAGNOSIS — E119 Type 2 diabetes mellitus without complications: Secondary | ICD-10-CM

## 2024-03-05 DIAGNOSIS — M47812 Spondylosis without myelopathy or radiculopathy, cervical region: Secondary | ICD-10-CM | POA: Diagnosis not present

## 2024-03-05 DIAGNOSIS — Z85828 Personal history of other malignant neoplasm of skin: Secondary | ICD-10-CM | POA: Diagnosis not present

## 2024-03-05 DIAGNOSIS — G992 Myelopathy in diseases classified elsewhere: Secondary | ICD-10-CM | POA: Diagnosis present

## 2024-03-05 DIAGNOSIS — M1990 Unspecified osteoarthritis, unspecified site: Secondary | ICD-10-CM | POA: Diagnosis not present

## 2024-03-05 DIAGNOSIS — T148XXA Other injury of unspecified body region, initial encounter: Secondary | ICD-10-CM

## 2024-03-05 DIAGNOSIS — E8809 Other disorders of plasma-protein metabolism, not elsewhere classified: Secondary | ICD-10-CM | POA: Diagnosis present

## 2024-03-05 DIAGNOSIS — Z79890 Hormone replacement therapy (postmenopausal): Secondary | ICD-10-CM | POA: Diagnosis not present

## 2024-03-05 DIAGNOSIS — Z7902 Long term (current) use of antithrombotics/antiplatelets: Secondary | ICD-10-CM | POA: Diagnosis not present

## 2024-03-05 DIAGNOSIS — D539 Nutritional anemia, unspecified: Secondary | ICD-10-CM | POA: Diagnosis present

## 2024-03-05 DIAGNOSIS — K5901 Slow transit constipation: Secondary | ICD-10-CM | POA: Diagnosis not present

## 2024-03-05 DIAGNOSIS — E782 Mixed hyperlipidemia: Secondary | ICD-10-CM | POA: Diagnosis not present

## 2024-03-05 DIAGNOSIS — S22030D Wedge compression fracture of third thoracic vertebra, subsequent encounter for fracture with routine healing: Secondary | ICD-10-CM | POA: Diagnosis not present

## 2024-03-05 DIAGNOSIS — E871 Hypo-osmolality and hyponatremia: Secondary | ICD-10-CM | POA: Diagnosis present

## 2024-03-05 DIAGNOSIS — R531 Weakness: Secondary | ICD-10-CM | POA: Diagnosis present

## 2024-03-05 DIAGNOSIS — N1832 Chronic kidney disease, stage 3b: Secondary | ICD-10-CM | POA: Diagnosis present

## 2024-03-05 DIAGNOSIS — D62 Acute posthemorrhagic anemia: Secondary | ICD-10-CM | POA: Diagnosis present

## 2024-03-05 DIAGNOSIS — M4712 Other spondylosis with myelopathy, cervical region: Secondary | ICD-10-CM | POA: Diagnosis not present

## 2024-03-05 DIAGNOSIS — E785 Hyperlipidemia, unspecified: Secondary | ICD-10-CM | POA: Diagnosis present

## 2024-03-05 DIAGNOSIS — R296 Repeated falls: Secondary | ICD-10-CM | POA: Diagnosis not present

## 2024-03-05 DIAGNOSIS — I959 Hypotension, unspecified: Secondary | ICD-10-CM | POA: Diagnosis not present

## 2024-03-05 DIAGNOSIS — I350 Nonrheumatic aortic (valve) stenosis: Secondary | ICD-10-CM | POA: Diagnosis present

## 2024-03-05 DIAGNOSIS — Z7401 Bed confinement status: Secondary | ICD-10-CM | POA: Diagnosis not present

## 2024-03-05 DIAGNOSIS — R350 Frequency of micturition: Secondary | ICD-10-CM | POA: Diagnosis not present

## 2024-03-05 DIAGNOSIS — Z8249 Family history of ischemic heart disease and other diseases of the circulatory system: Secondary | ICD-10-CM | POA: Diagnosis not present

## 2024-03-05 DIAGNOSIS — Y92129 Unspecified place in nursing home as the place of occurrence of the external cause: Secondary | ICD-10-CM | POA: Diagnosis not present

## 2024-03-05 DIAGNOSIS — G9529 Other cord compression: Secondary | ICD-10-CM | POA: Diagnosis not present

## 2024-03-05 DIAGNOSIS — M5459 Other low back pain: Secondary | ICD-10-CM | POA: Diagnosis not present

## 2024-03-05 DIAGNOSIS — E876 Hypokalemia: Secondary | ICD-10-CM | POA: Diagnosis not present

## 2024-03-05 DIAGNOSIS — I10 Essential (primary) hypertension: Secondary | ICD-10-CM | POA: Diagnosis not present

## 2024-03-05 DIAGNOSIS — E039 Hypothyroidism, unspecified: Secondary | ICD-10-CM | POA: Diagnosis present

## 2024-03-05 DIAGNOSIS — E1165 Type 2 diabetes mellitus with hyperglycemia: Secondary | ICD-10-CM | POA: Diagnosis not present

## 2024-03-05 DIAGNOSIS — E872 Acidosis, unspecified: Secondary | ICD-10-CM | POA: Diagnosis present

## 2024-03-05 DIAGNOSIS — N952 Postmenopausal atrophic vaginitis: Secondary | ICD-10-CM | POA: Diagnosis not present

## 2024-03-05 DIAGNOSIS — S22030A Wedge compression fracture of third thoracic vertebra, initial encounter for closed fracture: Secondary | ICD-10-CM | POA: Diagnosis present

## 2024-03-05 DIAGNOSIS — N3000 Acute cystitis without hematuria: Secondary | ICD-10-CM | POA: Diagnosis present

## 2024-03-05 DIAGNOSIS — E1122 Type 2 diabetes mellitus with diabetic chronic kidney disease: Secondary | ICD-10-CM | POA: Diagnosis present

## 2024-03-05 DIAGNOSIS — M6281 Muscle weakness (generalized): Secondary | ICD-10-CM | POA: Diagnosis not present

## 2024-03-05 DIAGNOSIS — I252 Old myocardial infarction: Secondary | ICD-10-CM | POA: Diagnosis not present

## 2024-03-05 LAB — GLUCOSE, CAPILLARY
Glucose-Capillary: 187 mg/dL — ABNORMAL HIGH (ref 70–99)
Glucose-Capillary: 115 mg/dL — ABNORMAL HIGH (ref 70–99)
Glucose-Capillary: 156 mg/dL — ABNORMAL HIGH (ref 70–99)
Glucose-Capillary: 127 mg/dL — ABNORMAL HIGH (ref 70–99)

## 2024-03-05 LAB — BPAM RBC
Blood Product Expiration Date: 202504062359
ISSUE DATE / TIME: 202503081418
Unit Type and Rh: 6200

## 2024-03-05 LAB — TYPE AND SCREEN
ABO/RH(D): A POS
Antibody Screen: NEGATIVE
Unit division: 0

## 2024-03-05 LAB — CBC WITH DIFFERENTIAL/PLATELET
Abs Immature Granulocytes: 0.03 10*3/uL (ref 0.00–0.07)
Basophils Absolute: 0.1 10*3/uL (ref 0.0–0.1)
Basophils Relative: 1 %
Eosinophils Absolute: 0.3 10*3/uL (ref 0.0–0.5)
Eosinophils Relative: 3 %
HCT: 30.3 % — ABNORMAL LOW (ref 36.0–46.0)
Hemoglobin: 9.6 g/dL — ABNORMAL LOW (ref 12.0–15.0)
Immature Granulocytes: 0 %
Lymphocytes Relative: 35 %
Lymphs Abs: 2.7 10*3/uL (ref 0.7–4.0)
MCH: 32.2 pg (ref 26.0–34.0)
MCHC: 31.7 g/dL (ref 30.0–36.0)
MCV: 101.7 fL — ABNORMAL HIGH (ref 80.0–100.0)
Monocytes Absolute: 0.8 10*3/uL (ref 0.1–1.0)
Monocytes Relative: 11 %
Neutro Abs: 3.9 10*3/uL (ref 1.7–7.7)
Neutrophils Relative %: 50 %
Platelets: 233 10*3/uL (ref 150–400)
RBC: 2.98 MIL/uL — ABNORMAL LOW (ref 3.87–5.11)
RDW: 18.9 % — ABNORMAL HIGH (ref 11.5–15.5)
WBC: 7.7 10*3/uL (ref 4.0–10.5)
nRBC: 0 % (ref 0.0–0.2)

## 2024-03-05 LAB — PHOSPHORUS: Phosphorus: 4.1 mg/dL (ref 2.5–4.6)

## 2024-03-05 LAB — COMPREHENSIVE METABOLIC PANEL
ALT: 26 U/L (ref 0–44)
AST: 39 U/L (ref 15–41)
Albumin: 2.4 g/dL — ABNORMAL LOW (ref 3.5–5.0)
Alkaline Phosphatase: 165 U/L — ABNORMAL HIGH (ref 38–126)
Anion gap: 14 (ref 5–15)
BUN: 27 mg/dL — ABNORMAL HIGH (ref 8–23)
CO2: 17 mmol/L — ABNORMAL LOW (ref 22–32)
Calcium: 8.7 mg/dL — ABNORMAL LOW (ref 8.9–10.3)
Chloride: 106 mmol/L (ref 98–111)
Creatinine, Ser: 1.09 mg/dL — ABNORMAL HIGH (ref 0.44–1.00)
GFR, Estimated: 47 mL/min — ABNORMAL LOW (ref >60)
Glucose, Bld: 143 mg/dL — ABNORMAL HIGH (ref 70–99)
Potassium: 4 mmol/L (ref 3.5–5.1)
Sodium: 137 mmol/L (ref 135–145)
Total Bilirubin: 0.8 mg/dL (ref 0.0–1.2)
Total Protein: 6.3 g/dL — ABNORMAL LOW (ref 6.5–8.1)

## 2024-03-05 LAB — URINE CULTURE

## 2024-03-05 LAB — MAGNESIUM: Magnesium: 1.9 mg/dL (ref 1.7–2.4)

## 2024-03-05 MED ORDER — MELATONIN 5 MG PO TABS
5.0000 mg | ORAL_TABLET | Freq: Every evening | ORAL | Status: DC | PRN
  Administered 2024-03-07: 5 mg via ORAL
  Filled 2024-03-05 (×2): qty 1

## 2024-03-05 MED ORDER — METHOCARBAMOL 500 MG PO TABS: 500.0000 mg | ORAL_TABLET | Freq: Three times a day (TID) | ORAL | Status: DC | PRN

## 2024-03-05 NOTE — Care Management Obs Status (Signed)
 MEDICARE OBSERVATION STATUS NOTIFICATION   Patient Details  Name: Kathryn Carlson MRN: [id number] Date of Birth: October 22, 1927   Medicare Observation Status Notification Given:  Yes    Yeva Bissette G., RN 03/05/2024, 8:52 AM

## 2024-03-05 NOTE — Hospital Course (Signed)
 The patient is a 89 year old Caucasian female with a past medical history significant for basal cell carcinoma, CAD status post CABG, history of MI, hypertension, GERD, hypothyroidism, was brought to the ED given lower extremity weakness with recurrent multiple falls.  She has been following and developed a hematoma, the left periorbital area after a fall in her facility.  She has been having trouble weightbearing on her left lower extremity due to the pain.  Further workup was done and shows a UTI and fecal occult blood negative.  She has a slight minimally displaced wedge fracture of T3 but no pain.  PT OT recommending SNF now. Blood count appears stable she is stable to discharge to SNF and will need continued therapy and follow-up with PCP and and possibly hematology follow-up for blood count if continues to be on lower side in outpatient setting.  Assessment and Plan:  Generalized weakness and Recurrent Falls In the setting of  Acute UTI (urinary tract infection): Admit to telemetry/inpatient.  Urinalysis done showed a cloudy appearance with amber color urine, small hemoglobin, moderate leukocytes, negative nitrites, few bacteria, greater than 50 WBCs with urine culture showing multiple species present so we will recollect but never done. C/w ceftriaxone 1 g IVPB daily changed to p.o. cefdinir for completion of course. Follow urine culture and sensitivity however shows multiple species.  Continue monitor and follow-up in outpatient setting within 1 to 2 weeks Hyponatremia: Improved;Minimally decreased at 133 on admission and now 138 -> 135. CTM and Trend and repeat CMP w/in 1 week   Gluteal hematoma Superimposed on Macrocytic anemia Leading to Acute on chronic blood loss Anemia -Anemia Panel Showed an iron level of 30, UIBC 200, TIBC 230, saturation ratio 13%, ferritin of 102 -Negative occult blood in stool. Transfuse 1 unit of PRBC. Hgb/Hct dropped to 6.9/21.9 and now relatively stable (the last 4  checks where Hb is ~ 9. -No longer taking Clopidogrel. Will CTM for S/Sx of Bleeding; No overt bleeding noted. Repeat CBC within 1 week and if necessary refer to hematology.   Essential HTN (hypertension): Soft BP readings initially..Hold Amlodipine and resume BB at half the dose as below. CTM BP per Protocol; Last BP reading was 125/54   CAD (coronary artery disease): Status post CABG. Continue statin. Resume beta-blocker but at half the dose at 50 mg of Metoprolol Suxxinate. Continue to hold CCB and consider resumption at discharge and monitor for any chest pain   Dyslipidemia / Aortic Stenosis, mild: Continue Atorvastatin 40 mg p.o. daily.   Hypothyroidism: TSH was 6.214; Continue Levothyroxine 75 mcg p.o. daily and may need to be increased in outpatient setting when repeat thyroid function studies in 4 to 6 weeks.   Type 2 Diabetes Mellitus, Diet controlled (HCC): Carbohydrate modified diet. CBG monitoring with Sensitive Novolog SSI. Check HbA1c. CBGs ranging from 101-136 on last 5 checks  CKD Stage 3b / Metabolic Acidosis: Stable as BUN/Cr now 25/1.08 today. Has a slight metabolic acidosis with a CO2 of 21, anion gap of 4, chloride level 110 -Avoid Nephrotoxic Medications, Contrast Dyes, Hypotension and Dehydration to Ensure Adequate Renal Perfusion and will need to Renally Adjust Meds. CTM and Trend Renal Function carefully and repeat CMP in the AM    Hypocalcemia: Recheck calcium with Albumin level in outpatient setting as corrected calcium was 9.4  T3 Compression Fx:  Minimally depressed wedge compression fracture of T3 noted on CT Scan; Not having Sx but will need Pain control if necessary   Cervical Spine Abnormality:  CT showed "Small amount of epidural gas with adjacent hyperdensity within the lower cervical spine, unchanged since 02/27/2024. While this is somewhat nonspecific, epidural infection can cause this appearance." -MRI Cervical Spine done and showed "Highly abnormal  cervical spine C5-C6 level. Suboptimal MRI resolution but the constellation of CT and MRI findings favors advanced degenerative etiology - such as synovial cyst(s) and/or ligament flavum hypertrophy with partial calcification, associated vacuum phenomena - superimposed on spondylolisthesis and severe facet arthropathy. No strong evidence of spinal infection or malignancy.. High-grade spinal stenosis associated with #1 with cord compression there, but no spinal cord signal abnormality identified. Other advanced cervical spine degeneration, most notably C1-C2 where bulky and partially calcified ligamentous hypertrophy both ventral and dorsal to the spinal cord. Spinal stenosis with less pronounced C1-C2 cord mass effect.. Chronic T3 superior endplate compression. No marrow edema or acute osseous abnormality identified." -Neurosurgery evaluated and they feel that her severe spinal cord compression at C5-C6 with multi level spondylosis could be contributing her frequent falls but given her age and medical history she is not a surgical candidate and they are recommending PT OT with helping her gait and stability  Hypoalbuminemia: Patient's Albumin ranging from 2.3-2.4. CTM and Trend and repeat CMP w/in 1 week

## 2024-03-05 NOTE — Evaluation (Signed)
 Physical Therapy Evaluation Patient Details Name: Kathryn Carlson MRN: [id number] DOB: [birth year] Today's Date: 03/05/2024  History of Present Illness  Patient is a 89 y/o female admitted 03/03/24 from her ALF with recent multiple falls and L LE weakness,  Found to have UTI, L hip subcutaneous hematoma but no acute fracture, and pt with periorbital hematoma from fracture seen in ED a week ago due to fall without issues on her scans then, though does have T3 compression fracture of unknown age on thoracic CT.  PMH positive for CAD, GERD, HTN, hypothyroid, multiple SCC skin removal, and h/o B TKA.  Clinical Impression  Patient presents with decreased mobility due to pain, limited activity tolerance, decreased balance and generalized weakness as well as focal L LE weakness.  She was previously ambulatory at her ALF using rollator and getting assistance with meals and shower and meds.  She has had about 10 falls, however, in past 2-3 weeks and having greater difficulty caring for herself.  She was able to mobilize to EOB with mod A with HOB up and needed mod to max A to attempt standing with RW though unable to stand fully to complete orthostatic vitals assessment.  She did get up with mod A and elevated bed to [facility] to allow up to recliner.  She denied dizziness, but does have some L ankle pain and daughter reports MD plans to image.  Patient will benefit from skilled PT in the acute setting and from inpatient rehab (<3 hours/day) prior to return to ALF.   Orthostatic VS for the past 24 hrs (Last 3 readings):  BP- Lying Pulse- Lying BP- Sitting Pulse- Sitting  03/05/24 1300 120/90 91 117/80 93   Unable to complete testing as pt unable to stand, once in recliner BP 119/51, HR 87        If plan is discharge home, recommend the following: Two people to help with walking and/or transfers;A lot of help with bathing/dressing/bathroom;Help with stairs or ramp for entrance;Assist for transportation;Assistance  with cooking/housework   Can travel by private vehicle   No    Equipment Recommendations None recommended by PT  Recommendations for Other Services       Functional Status Assessment Patient has had a recent decline in their functional status and demonstrates the ability to make significant improvements in function in a reasonable and predictable amount of time.     Precautions / Restrictions Precautions Precautions: Fall Precaution/Restrictions Comments: watch BP      Mobility  Bed Mobility Overal bed mobility: Needs Assistance Bed Mobility: Supine to Sit     Supine to sit: HOB elevated, Used rails, Mod assist     General bed mobility comments: assist for trunk and scooting hips    Transfers Overall transfer level: Needs assistance Equipment used: Rolling walker (2 wheels), Ambulation equipment used Transfers: Sit to/from Stand, Bed to chair/wheelchair/BSC Sit to Stand: Via lift equipment, From elevated surface, Mod assist, Max assist           General transfer comment: unable to fully stand to RW from EOB with mod to max A with c/o L knee weakness; used Stedy for sit to stand from elevated surface with mod A and transferred to recliner with Stedy. Transfer via Lift Equipment: Stedy  Ambulation/Gait               General Gait Details: unable  Acupuncturist  Bed    Modified Rankin (Stroke Patients Only)       Balance Overall balance assessment: Needs assistance Sitting-balance support: Feet supported Sitting balance-Leahy Scale: Poor Sitting balance - Comments: leaning back a bit on EOB, CGA for safety Postural control: Posterior lean Standing balance support: Bilateral upper extremity supported Standing balance-Leahy Scale: Zero Standing balance comment: unable to fully stand with A and walker                             Pertinent Vitals/Pain Pain Assessment Pain Assessment: Faces Faces  Pain Scale: Hurts little more Pain Location: L foot/ankle and (with mobility) knee Pain Descriptors / Indicators: Discomfort, Aching, Sore Pain Intervention(s): Monitored during session, Repositioned, Limited activity within patient's tolerance    Home Living Family/patient expects to be discharged to:: Assisted living                 Home Equipment: Rollator (4 wheels)      Prior Function Prior Level of Function : Needs assist;History of Falls (last six months)             Mobility Comments: until about past 3 weeks getting help only for showers and walking to dining room for meals.  Has had about 10 falls in 3 weeks       Extremity/Trunk Assessment   Upper Extremity Assessment Upper Extremity Assessment: Defer to OT evaluation    Lower Extremity Assessment Lower Extremity Assessment: RLE deficits/detail;LLE deficits/detail RLE Deficits / Details: AROM WFL, strength 4-/5 LLE Deficits / Details: AROM slightly decreased L ankle DF compared to R and noted little bruised on lateral malleolus, strength 3+/5 knee extension, noted numbness on lateral aspect of foot and medial shin, intact lateral shin and thighs.  Significant bruising on knee LLE Sensation: decreased light touch LLE Coordination: decreased gross motor    Cervical / Trunk Assessment Cervical / Trunk Assessment: Kyphotic  Communication   Communication Communication: Impaired Factors Affecting Communication: Hearing impaired (hearing aides not here)    Cognition Arousal: Alert Behavior During Therapy: WFL for tasks assessed/performed   PT - Cognitive impairments: History of cognitive impairments                         Following commands: Intact       Cueing       General Comments General comments (skin integrity, edema, etc.): bruising L eye, L knee, L lateral malleolus, R great toe; attempted orthostatics, but pt unable to stand, once seated in recliner BP 119/51, HR 87; daughter  present throughout and helping with history as pt hard of hearing    Exercises     Assessment/Plan    PT Assessment Patient needs continued PT services  PT Problem List Decreased strength;Decreased mobility;Decreased activity tolerance;Decreased balance;Decreased knowledge of use of DME;Pain;Impaired sensation       PT Treatment Interventions DME instruction;Gait training;Therapeutic exercise;Balance training;Functional mobility training;Patient/family education;Therapeutic activities    PT Goals (Current goals can be found in the Care Plan section)  Acute Rehab PT Goals Patient Stated Goal: return to ALF eventually PT Goal Formulation: With patient/family Time For Goal Achievement: 03/19/24 Potential to Achieve Goals: Fair    Frequency Min 2X/week     Co-evaluation               AM-PAC PT "6 Clicks" Mobility  Outcome Measure Help needed turning from your back to your side while in  a flat bed without using bedrails?: A Lot Help needed moving from lying on your back to sitting on the side of a flat bed without using bedrails?: A Lot Help needed moving to and from a bed to a chair (including a wheelchair)?: Total Help needed standing up from a chair using your arms (e.g., wheelchair or bedside chair)?: Total Help needed to walk in hospital room?: Total Help needed climbing 3-5 steps with a railing? : Total 6 Click Score: 8    End of Session Equipment Utilized During Treatment: Gait belt Activity Tolerance: Patient limited by fatigue Patient left: in chair;with call bell/phone within reach;with family/visitor present Nurse Communication: Need for lift equipment;Mobility status PT Visit Diagnosis: Other abnormalities of gait and mobility (R26.89);Repeated falls (R29.6);Muscle weakness (generalized) (M62.81)    Time: 1330-1402 PT Time Calculation (min) (ACUTE ONLY): 32 min   Charges:   PT Evaluation $PT Eval Moderate Complexity: 1 Mod PT Treatments $Therapeutic  Activity: 8-22 mins PT General Charges $$ ACUTE PT VISIT: 1 Visit         Sheran Lawless, PT Acute Rehabilitation Services [phone] 03/05/2024   Elray Mcgregor 03/05/2024, 2:31 PM

## 2024-03-05 NOTE — Progress Notes (Addendum)
 PROGRESS NOTE    Kathryn Carlson  NWG:[id number] DOB: 11-29-1927 DOA: 03/03/2024 PCP: Mast, Man X, NP   Brief Narrative:  Is a 89 year old Caucasian female with a past medical history significant for basal cell carcinoma, CAD status post CABG, history of MI, hypertension, GERD, hypothyroidism, was brought to the ED given lower extremity weakness with recurrent multiple falls.  She has been following and developed a hematoma, the left periorbital area after a fall in her facility.  She has been having trouble weightbearing on her left lower extremity due to the pain.  Further workup was done and shows a UTI and fecal occult blood negative.  She has a slight minimally displaced wedge fracture of T3.  PT OT recommending SNF now.  Assessment and Plan:  Generalized weakness and Recurrent Falls In the setting of  Acute UTI (urinary tract infection): Admit to telemetry/inpatient.  Urinalysis done showed a cloudy appearance with amber color urine, small hemoglobin, moderate leukocytes, negative nitrites, few bacteria, greater than 50 WBCs with urine culture showing multiple species present so we will recollect. C/w ceftriaxone 1 g IVPB daily. Follow urine culture and sensitivity. Follow-up CBC and chemistry in the morning.   Hyponatremia: Minimally decreased at 133. Unknown clinical significance.CTM and Trend and repeat CMP in the AM   Gluteal hematoma Superimposed on: Macrocytic anemia Leading to:  Acute on chronic blood loss anemia -Anemia Panel Showed an iron level of 30, UIBC 200, TIBC 230, saturation ratio 13%, ferritin of 102 -Negative occult blood in stool. Transfuse 1 unit of PRBC. Hgb/Hct Trend: Recent Labs  Lab 03/03/24 1554 03/04/24 0849 03/04/24 1939 03/05/24 1817  HGB 7.3* 6.9* 8.8* 9.6*  HCT 24.1* 21.9* 27.6* 30.3*  MCV 104.8*  --   --  101.7*  -Clopidogrel and looks like her hemoglobin is rebound nicely after transfusion.   Essential HTN (hypertension): Soft BP readings.Hold  antihypertensives. CTM BP per Protocol; Last BP reading was 124/96   CAD (coronary artery disease): Status post CABG. Continue statin. Resume beta-blocker and CCB tomorrow. And Monitor for CP   Dyslipidemia / Aortic stenosis, mild: Continue atorvastatin 40 mg p.o. daily.   Hypothyroidism: Check TSH in the AM; Continue levothyroxine 75 mcg p.o. daily.   Type 2 diabetes, diet controlled (HCC): Carbohydrate modified diet. CBG monitoring with RI SS. Check hemoglobin A1c.   Hypocalcemia: Recheck calcium with albumin level in AM.  T3 Compression Fx:  Minimally depressed wedge compression fracture of T3 noted on CT Scan; Pain Control  Cervical Spine Abnormality: CT showed "Small amount of epidural gas with adjacent hyperdensity within the lower cervical spine, unchanged since 02/27/2024. While this is somewhat nonspecific, epidural infection can cause this appearance." -MRI Cervical Spine done and showed "Highly abnormal cervical spine C5-C6 level. Suboptimal MRI resolution but the constellation of CT and MRI findings favors advanced degenerative etiology - such as synovial cyst(s) and/or ligament flavum hypertrophy with partial calcification, associated vacuum phenomena - superimposed on spondylolisthesis and severe facet arthropathy. No strong evidence of spinal infection or malignancy.. High-grade spinal stenosis associated with #1 with cord compression there, but no spinal cord signal abnormality identified. Other advanced cervical spine degeneration, most notably C1-C2 where bulky and partially calcified ligamentous hypertrophy both ventral and dorsal to the spinal cord. Spinal stenosis with less pronounced C1-C2 cord mass effect.. Chronic T3 superior endplate compression. No marrow edema or acute osseous abnormality identified." -Will discuss with Neurosurgery in the AM if any intervention is needed    DVT prophylaxis: SCDs Start: 03/04/24  [id number]    Code Status: Full Code Family Communication:  Discussed w/ Family present at bedside  Disposition Plan:  Level of care: Telemetry Medical Status is: Inpatient Remains inpatient appropriate because: Needs further improvement and PT/OT Recommending SNF   Consultants:  None  Procedures:  As delineated as above  Antimicrobials:  Anti-infectives (From admission, onward)    Start     Dose/Rate Route Frequency Ordered Stop   03/04/24 2300  cefTRIAXone (ROCEPHIN) 1 g in sodium chloride 0.9 % 100 mL IVPB        1 g 200 mL/hr over 30 Minutes Intravenous Every 24 hours 03/04/24 0800     03/04/24 0000  cefTRIAXone (ROCEPHIN) 1 g in sodium chloride 0.9 % 100 mL IVPB        1 g 200 mL/hr over 30 Minutes Intravenous  Once 03/03/24 2345 03/04/24 4034       Subjective: Seen and Examined at bedside and she was not complaining of any pain.  She is extremely hard of hearing.  Only complaints that she has is intermittent pain in her ankle which is not hurting currently. Family thinks she is doing better after the unit of blood that she was transfused  Objective: Vitals:   03/05/24 0500 03/05/24 0505 03/05/24 0856 03/05/24 1729  BP:  128/70 113/72 (!) 124/96  Pulse:  72 88 82  Resp:  17    Temp:  98.6 F (37 C) 98.7 F (37.1 C)   TempSrc:  Oral Oral   SpO2:  96% 95% 100%  Weight: 75.9 kg     Height:        Intake/Output Summary (Last 24 hours) at 03/05/2024 1915 Last data filed at 03/05/2024 1500 Gross per 24 hour  Intake 566 ml  Output --  Net 566 ml   Filed Weights   03/03/24 1355 03/05/24 0500  Weight: 70 kg 75.9 kg   Examination: Physical Exam:  Constitutional: Thin chronically ill-appearing Caucasian female who is extremely hard of hearing Respiratory: Diminished to auscultation bilaterally, no wheezing, rales, rhonchi or crackles. Normal respiratory effort and patient is not tachypenic. No accessory muscle use.  Unlabored breathing Cardiovascular: RRR, Has a 3/6 Systolic murmur. S1 and S2 auscultated. No extremity edema.    Abdomen: Soft, non-tender, non-distended. . Bowel sounds positive.  GU: Deferred. Musculoskeletal: No clubbing / cyanosis of digits/nails. No joint deformity upper and lower extremities. Skin: Has facial bruising noted from her falls and bruising on her back Neurologic: CN 2-12 grossly intact with no focal deficits except that she is extremely hard of hearing Romberg sign cerebellar reflexes not assessed.  Psychiatric: Awake and alert  Data Reviewed: I have personally reviewed following labs and imaging studies  CBC: Recent Labs  Lab 03/03/24 1554 03/04/24 0849 03/04/24 1939 03/05/24 1817  WBC 9.5  --   --  7.7  NEUTROABS  --   --   --  3.9  HGB 7.3* 6.9* 8.8* 9.6*  HCT 24.1* 21.9* 27.6* 30.3*  MCV 104.8*  --   --  101.7*  PLT 232  --   --  233   Basic Metabolic Panel: Recent Labs  Lab 03/03/24 1554  NA 133*  K 4.0  CL 104  CO2 20*  GLUCOSE 129*  BUN 25*  CREATININE 1.18*  CALCIUM 8.4*   GFR: Estimated Creatinine Clearance: 30.3 mL/min (A) (by C-G formula based on SCr of 1.18 mg/dL (H)). Liver Function Tests: No results for input(s): "AST", "ALT", "ALKPHOS", "BILITOT", "PROT", "ALBUMIN" in  the last 168 hours. No results for input(s): "LIPASE", "AMYLASE" in the last 168 hours. No results for input(s): "AMMONIA" in the last 168 hours. Coagulation Profile: Recent Labs  Lab 03/04/24 0849  INR 1.2   Cardiac Enzymes: No results for input(s): "CKTOTAL", "CKMB", "CKMBINDEX", "TROPONINI" in the last 168 hours. BNP (last 3 results) No results for input(s): "PROBNP" in the last 8760 hours. HbA1C: No results for input(s): "HGBA1C" in the last 72 hours. CBG: Recent Labs  Lab 03/04/24 1601 03/04/24 2123 03/05/24 0844 03/05/24 1232 03/05/24 1600  GLUCAP 137* 116* 187* 115* 156*   Lipid Profile: No results for input(s): "CHOL", "HDL", "LDLCALC", "TRIG", "CHOLHDL", "LDLDIRECT" in the last 72 hours. Thyroid Function Tests: No results for input(s): "TSH", "T4TOTAL",  "FREET4", "T3FREE", "THYROIDAB" in the last 72 hours. Anemia Panel: Recent Labs    03/04/24 0849  FERRITIN 102  TIBC 230*  IRON 30   Sepsis Labs: No results for input(s): "PROCALCITON", "LATICACIDVEN" in the last 168 hours.  Recent Results (from the past 240 hours)  Urine Culture     Status: Abnormal   Collection Time: 03/03/24  2:24 PM   Specimen: Urine, Clean Catch  Result Value Ref Range Status   Specimen Description   Final    URINE, CLEAN CATCH Performed at [facility], [address] [place], [place] [address]    Special Requests   Final    NONE Performed at [facility], [address], [place], [place] [address]    Culture MULTIPLE SPECIES PRESENT, SUGGEST RECOLLECTION (A)  Final   Report Status 03/05/2024 FINAL  Final  MRSA Next Gen by PCR, Nasal     Status: Abnormal   Collection Time: 03/04/24 12:42 PM   Specimen: Nasal Mucosa; Nasal Swab  Result Value Ref Range Status   MRSA by PCR Next Gen DETECTED (A) NOT DETECTED Final    Comment: (NOTE) The GeneXpert MRSA Assay (FDA approved for NASAL specimens only), is one component of a comprehensive MRSA colonization surveillance program. It is not intended to diagnose MRSA infection nor to guide or monitor treatment for MRSA infections. Test performance is not FDA approved in patients less than 20 years old. Performed at [facility], [address] [place], [place] [address]     Radiology Studies: MR Cervical Spine W and Wo Contrast Result Date: 03/04/2024 CLINICAL DATA:  89 year old female status post fall. Age indeterminate T3 compression fracture and nonspecific epidural gas within the cervical spine by CT. EXAM: MRI CERVICAL SPINE WITHOUT AND WITH CONTRAST TECHNIQUE: Multiplanar and multiecho pulse sequences of the cervical spine, to include the craniocervical junction and cervicothoracic junction, were obtained without and with intravenous contrast. CONTRAST:  7mL  GADAVIST GADOBUTROL 1 MMOL/ML IV SOLN COMPARISON:  CT cervical spine 02/27/2024, thoracic spine 03/03/2024. FINDINGS: Intermittently motion degraded exam, especially most of the sagittal cervical imaging. Alignment: Multilevel degenerative appearing spondylolisthesis is stable, worst at C5-C6 (3-4 mm there). Vertebrae: Normal background bone marrow signal. T3 mild superior endplate compression appears chronic and No marrow edema or evidence of acute osseous abnormality. Cord: Degenerative appearing spinal cord compression at C5-C6, up to moderate (series 6, image 10). Limited spinal cord detail on this exam but no obvious spinal cord signal changes. C5 and C6 have been the epicenter of the abnormal spinal canal gas on previous CTs. See additional details below. On postcontrast axial images there is mild abnormal enhancement within the spinal canal at C5-C6, primarily in the posterior midline which  appears superimposed on a small volume of gas series 14, image 32) and medial to chronically degenerated facets there (series 14, image 33) where there is rim enhancement of un intracanalicular space-occupying lesion which is 5-6 mm in thickness. Contralateral right C5-C6 hyperdense canal space-occupying mass by CT appears to be low-density on MRI best seen on series 9, image 29, nonenhancing. There is no generalized dural thickening or enhancement. And no abnormal intradural enhancement identified. Posterior Fossa, vertebral arteries, paraspinal tissues: Mild degenerative mass effect at the cervicomedullary junction. No cervicomedullary, visible brainstem or cerebellar signal abnormality or abnormal enhancement. Preserved major vascular flow voids in the neck. No cervical paraspinal soft tissue inflammation or mass identified. Negative visible right lung apex. Disc levels: Advanced cervical spine degeneration most notable for C1-C2: Bulky ligamentous hypertrophy about the odontoid which is partially calcified by CT,  superimposed on bulky C1-C2 degeneration. And there is also bulky partially calcified ligament flavum hypertrophy at that level. Combined there is C1-C2 level spinal stenosis with up to mild spinal cord mass effect (series 6 image 7). C5-C6: Spondylolisthesis of 3, up to 4 mm associated with severe bilateral facet arthropathy, bilateral posterior element hypertrophy including apparent ligamentous hypertrophy which by CT is hyperdense and possibly calcified, low signal on this exam. Abnormal mostly midline posterior epidural space gas and left side rim calcified space-occupying lesion as seen on series 14, image 33. Moderate to severe spinal stenosis and spinal cord mass effect (series 9, image 33 and series 6, image 10). Bilateral C6 foraminal stenosis is moderate to severe, worse on the right. C6-C7 and C7-T1 less pronounced spondylolisthesis. No significant spinal stenosis at those levels. Bilateral foraminal stenosis. IMPRESSION: 1. Highly abnormal cervical spine C5-C6 level. Suboptimal MRI resolution but the constellation of CT and MRI findings favors advanced degenerative etiology - such as synovial cyst(s) and/or ligament flavum hypertrophy with partial calcification, associated vacuum phenomena - superimposed on spondylolisthesis and severe facet arthropathy. No strong evidence of spinal infection or malignancy. 2. High-grade spinal stenosis associated with #1 with cord compression there, but no spinal cord signal abnormality identified. 3. Other advanced cervical spine degeneration, most notably C1-C2 where bulky and partially calcified ligamentous hypertrophy both ventral and dorsal to the spinal cord. Spinal stenosis with less pronounced C1-C2 cord mass effect. 4. Chronic T3 superior endplate compression. No marrow edema or acute osseous abnormality identified. Electronically Signed   By: Odessa Fleming M.D.   On: 03/04/2024 04:49   CT ABDOMEN PELVIS W CONTRAST Result Date: 03/03/2024 CLINICAL DATA:  Abdominal  trauma, blunt.  Multiple falls. EXAM: CT ABDOMEN AND PELVIS WITH CONTRAST TECHNIQUE: Multidetector CT imaging of the abdomen and pelvis was performed using the standard protocol following bolus administration of intravenous contrast. RADIATION DOSE REDUCTION: This exam was performed according to the departmental dose-optimization program which includes automated exposure control, adjustment of the mA and/or kV according to patient size and/or use of iterative reconstruction technique. CONTRAST:  OMNIPAQUE IOHEXOL 300 MG/ML  SOLN COMPARISON:  09/20/2019. FINDINGS: Lower chest: The heart is enlarged and there is no pericardial effusion. Multi-vessel coronary artery calcifications are noted there is a trace left pleural effusion. Atelectasis is present at the lung bases bilaterally. Hepatobiliary: No focal liver abnormality is seen. No gallstones, gallbladder wall thickening, or biliary dilatation. Pancreas: Unremarkable. No pancreatic ductal dilatation or surrounding inflammatory changes. Spleen: Normal in size without focal abnormality. Adrenals/Urinary Tract: The adrenal glands are within normal limits. The kidneys enhance symmetrically. Renal atrophy is noted on the left. There  are scattered hypodensities in the kidneys bilaterally which are too small to further characterize. A hyperdense lesion is noted in the mid left kidney measuring 1.2 cm with attenuation of 103 Hounsfield units, likely proteinaceous or hemorrhagic cyst. No renal calculus or hydronephrosis bilaterally. There is mild bladder wall thickening. Stomach/Bowel: Stomach is within normal limits. Appendix is not seen. No evidence of bowel wall thickening, distention, or inflammatory changes. No free air or pneumatosis. Scattered diverticula are present along the colon without evidence of diverticulitis. Vascular/Lymphatic: Aortic atherosclerosis. No enlarged abdominal or pelvic lymph nodes. Reproductive: Status post hysterectomy. No adnexal  masses. Other: No abdominopelvic ascites. A fat containing umbilical hernia is noted. Musculoskeletal: Scattered densities and subcutaneous fat stranding is noted over the left lateral hip, the largest measuring 4.3 x 2.6 x 4.8 cm, compatible with hematoma. Hyperdense attenuation is noted in the distal gluteal muscle on the left. Degenerative changes are present in the thoracolumbar spine and bilateral hips. Sternotomy wires are noted. IMPRESSION: 1. Subcutaneous hematoma and contusions over the left hip and likely small hematoma in the distal gluteal muscle on the left. No acute fracture is seen. 2. Trace left pleural effusion with atelectasis at the lung bases. 3. Diverticulosis without diverticulitis. 4. Aortic atherosclerosis. Electronically Signed   By: Thornell Sartorius M.D.   On: 03/03/2024 22:30   CT Thoracic Spine Wo Contrast Result Date: 03/03/2024 CLINICAL DATA:  Fall EXAM: CT THORACIC SPINE WITHOUT CONTRAST TECHNIQUE: Multidetector CT images of the thoracic were obtained using the standard protocol without intravenous contrast. RADIATION DOSE REDUCTION: This exam was performed according to the departmental dose-optimization program which includes automated exposure control, adjustment of the mA and/or kV according to patient size and/or use of iterative reconstruction technique. COMPARISON:  None Available. FINDINGS: Alignment: Normal. Vertebrae: There is a minimally depressed wedge compression fracture of T3. No other acute osseous abnormality. Vertebral body heights are otherwise maintained. Paraspinal and other soft tissues: There is small volume pneumorachis of the lower cervical spine, similar to 02/27/2024. Disc levels: There is no spinal canal stenosis. IMPRESSION: 1. Minimally depressed wedge compression fracture of T3. 2. Small amount of epidural gas with adjacent hyperdensity within the lower cervical spine, unchanged since 02/27/2024. While this is somewhat nonspecific, epidural infection can  cause this appearance. MRI with and without contrast of the cervical spine is recommended. Electronically Signed   By: Deatra Robinson M.D.   On: 03/03/2024 22:12   Scheduled Meds:  aspirin EC  81 mg Oral Daily   atorvastatin  40 mg Oral Daily   [START ON 03/06/2024] ferrous sulfate  325 mg Oral 3 times per day on Monday Wednesday Friday   HYDROcodone-acetaminophen  0.5 tablet Oral QHS   insulin aspart  0-9 Units Subcutaneous TID WC   levothyroxine  75 mcg Oral Q0600   [START ON 03/06/2024] polyethylene glycol  17 g Oral Once per day on Monday Wednesday Friday   Continuous Infusions:  cefTRIAXone (ROCEPHIN)  IV 1 g (03/04/24 2206)    LOS: 0 days   Marguerita Merles, DO [facility] Available via Epic secure chat 7am-7pm After these hours, please refer to coverage provider listed on [email or web address] 03/05/2024, 7:15 PM

## 2024-03-05 NOTE — Evaluation (Signed)
 Occupational Therapy Evaluation Patient Details Name: Kathryn Carlson MRN: [id number] DOB: 14-Jan-1927 Today's Date: 03/05/2024   History of Present Illness   Patient is a 89 y/o female admitted 03/03/24 from her ALF with recent multiple falls and L LE weakness,  Found to have UTI, L hip subcutaneous hematoma but no acute fracture, and pt with periorbital hematoma from fracture seen in ED a week ago due to fall without issues on her scans then, though does have T3 compression fracture of unknown age on thoracic CT.  PMH positive for CAD, GERD, HTN, hypothyroid, multiple SCC skin removal, and h/o B TKA.     Clinical Impressions Pt reports having assist x2 weeks for ADLs and mobility (limited, bedbound per daughter), however was ind before that and only had assist for showers. Pt currently needing up to max A for ADLs, and mod A for standing in stedy frame for grooming task and standing functional reaching during session. Pt reports feeling weaker in L shoulder and L knee compared to R side. Pt presenting with impairments listed below, will follow acutely. Patient will benefit from continued inpatient follow up therapy, <3 hours/day to maximize safety/ind with ADL/functional mobility.      If plan is discharge home, recommend the following:   Two people to help with walking and/or transfers;A lot of help with bathing/dressing/bathroom;Assistance with cooking/housework;Direct supervision/assist for medications management;Assistance with feeding;Direct supervision/assist for financial management;Assist for transportation;Help with stairs or ramp for entrance     Functional Status Assessment   Patient has had a recent decline in their functional status and demonstrates the ability to make significant improvements in function in a reasonable and predictable amount of time.     Equipment Recommendations   Other (comment) (defer)     Recommendations for Other Services   PT consult      Precautions/Restrictions   Precautions Precautions: Fall Precaution/Restrictions Comments: watch BP Restrictions Weight Bearing Restrictions Per Provider Order: No     Mobility Bed Mobility                    Transfers Overall transfer level: Needs assistance Equipment used: Rolling walker (2 wheels), Ambulation equipment used Transfers: Sit to/from Stand, Bed to chair/wheelchair/BSC Sit to Stand: Via lift equipment, From elevated surface, Mod assist, Max assist           General transfer comment: mod A for initial stand, cues to remain upright Transfer via Lift Equipment: Stedy    Balance Overall balance assessment: Needs assistance Sitting-balance support: Feet supported Sitting balance-Leahy Scale: Fair Sitting balance - Comments: edge of chair with CGA   Standing balance support: Bilateral upper extremity supported Standing balance-Leahy Scale: Poor Standing balance comment: pulls self up in stedy frame with mod A from chair, CGA when standing with stedy flaps down                           ADL either performed or assessed with clinical judgement   ADL Overall ADL's : Needs assistance/impaired Eating/Feeding: Set up;Sitting   Grooming: Brushing hair;Standing;Minimal assistance   Upper Body Bathing: Moderate assistance   Lower Body Bathing: Maximal assistance   Upper Body Dressing : Moderate assistance   Lower Body Dressing: Maximal assistance   Toilet Transfer: Moderate assistance Toilet Transfer Details (indicate cue type and reason): standing via stedy Toileting- Clothing Manipulation and Hygiene: Maximal assistance       Functional mobility during ADLs: Moderate assistance  Vision   Vision Assessment?: No apparent visual deficits     Perception Perception: Not tested       Praxis Praxis: Not tested       Pertinent Vitals/Pain Pain Assessment Pain Assessment: Faces Pain Score: 4  Faces Pain Scale: Hurts  little more Pain Location: L foot/ankle/knee Pain Descriptors / Indicators: Discomfort, Aching, Sore Pain Intervention(s): Limited activity within patient's tolerance, Monitored during session, Repositioned     Extremity/Trunk Assessment Upper Extremity Assessment Upper Extremity Assessment: Generalized weakness (limited shoulder ROM L >R)   Lower Extremity Assessment Lower Extremity Assessment: Defer to PT evaluation RLE Deficits / Details: AROM WFL, strength 4-/5 LLE Deficits / Details: AROM slightly decreased L ankle DF compared to R and noted little bruised on lateral malleolus, strength 3+/5 knee extension, noted numbness on lateral aspect of foot and medial shin, intact lateral shin and thighs.  Significant bruising on knee LLE Sensation: decreased light touch LLE Coordination: decreased gross motor   Cervical / Trunk Assessment Cervical / Trunk Assessment: Kyphotic   Communication Communication Communication: Impaired Factors Affecting Communication: Hearing impaired   Cognition Arousal: Alert Behavior During Therapy: WFL for tasks assessed/performed               OT - Cognition Comments: follows commands, aware of current deficits, not formally assessed, pt HOH                 Following commands: Intact       Cueing  General Comments      daughter present, helpful, provides a lot of PLOF info   Exercises     Shoulder Instructions      Home Living Family/patient expects to be discharged to:: Assisted living                             Home Equipment: Rollator (4 wheels)          Prior Functioning/Environment Prior Level of Function : Needs assist;History of Falls (last six months)  Cognitive Assist : Mobility (cognitive)           Mobility Comments: until about past 3 weeks getting help only for showers and walking to dining room for meals.  Has had about 10 falls in 3 weeks ADLs Comments: assist with ADLs x2 weeks due to  being "bedbound" per daughter, assist for showering before that    OT Problem List: Decreased strength;Decreased range of motion;Decreased activity tolerance;Impaired balance (sitting and/or standing);Decreased safety awareness   OT Treatment/Interventions: Self-care/ADL training;Therapeutic exercise;Neuromuscular education;DME and/or AE instruction;Energy conservation;Therapeutic activities;Patient/family education;Balance training;Visual/perceptual remediation/compensation;Cognitive remediation/compensation      OT Goals(Current goals can be found in the care plan section)   Acute Rehab OT Goals Patient Stated Goal: to get better OT Goal Formulation: With patient/family Time For Goal Achievement: 03/19/24 Potential to Achieve Goals: Good ADL Goals Pt Will Perform Grooming: with set-up;standing Pt Will Perform Upper Body Dressing: with contact guard assist;sitting Pt Will Perform Lower Body Dressing: with min assist;sitting/lateral leans;sit to/from stand Pt Will Transfer to Toilet: with min assist;squat pivot transfer;stand pivot transfer;bedside commode Pt/caregiver will Perform Home Exercise Program: Both right and left upper extremity;Increased ROM;Increased strength;With written HEP provided;With Supervision   OT Frequency:  Min 1X/week    Co-evaluation              AM-PAC OT "6 Clicks" Daily Activity     Outcome Measure Help from another person eating meals?: A Little Help  from another person taking care of personal grooming?: A Little Help from another person toileting, which includes using toliet, bedpan, or urinal?: A Lot Help from another person bathing (including washing, rinsing, drying)?: A Lot Help from another person to put on and taking off regular upper body clothing?: A Lot Help from another person to put on and taking off regular lower body clothing?: A Lot 6 Click Score: 14   End of Session Equipment Utilized During Treatment: Gait belt Nurse  Communication: Mobility status  Activity Tolerance: Patient tolerated treatment well Patient left: in chair;with call bell/phone within reach;with chair alarm set;with family/visitor present  OT Visit Diagnosis: Unsteadiness on feet (R26.81);Other abnormalities of gait and mobility (R26.89);Muscle weakness (generalized) (M62.81);History of falling (Z91.81);Repeated falls (R29.6)                Time: 1520-1550 OT Time Calculation (min): 30 min Charges:  OT General Charges $OT Visit: 1 Visit OT Evaluation $OT Eval Moderate Complexity: 1 Mod OT Treatments $Therapeutic Activity: 8-22 mins  Carver Fila, OTD, OTR/L SecureChat Preferred Acute Rehab [phone]   Carver Fila Koonce 03/05/2024, 4:08 PM

## 2024-03-05 NOTE — Plan of Care (Signed)

## 2024-03-06 DIAGNOSIS — D62 Acute posthemorrhagic anemia: Secondary | ICD-10-CM | POA: Diagnosis not present

## 2024-03-06 DIAGNOSIS — I10 Essential (primary) hypertension: Secondary | ICD-10-CM

## 2024-03-06 DIAGNOSIS — I350 Nonrheumatic aortic (valve) stenosis: Secondary | ICD-10-CM | POA: Diagnosis not present

## 2024-03-06 DIAGNOSIS — N390 Urinary tract infection, site not specified: Secondary | ICD-10-CM | POA: Diagnosis not present

## 2024-03-06 DIAGNOSIS — R531 Weakness: Secondary | ICD-10-CM | POA: Diagnosis not present

## 2024-03-06 DIAGNOSIS — E039 Hypothyroidism, unspecified: Secondary | ICD-10-CM

## 2024-03-06 LAB — CBC WITH DIFFERENTIAL/PLATELET
Abs Immature Granulocytes: 0.02 10*3/uL (ref 0.00–0.07)
Basophils Absolute: 0 10*3/uL (ref 0.0–0.1)
Basophils Relative: 1 %
Eosinophils Absolute: 0.3 10*3/uL (ref 0.0–0.5)
Eosinophils Relative: 5 %
HCT: 29.1 % — ABNORMAL LOW (ref 36.0–46.0)
Hemoglobin: 9.2 g/dL — ABNORMAL LOW (ref 12.0–15.0)
Immature Granulocytes: 0 %
Lymphocytes Relative: 28 %
Lymphs Abs: 1.8 10*3/uL (ref 0.7–4.0)
MCH: 31.4 pg (ref 26.0–34.0)
MCHC: 31.6 g/dL (ref 30.0–36.0)
MCV: 99.3 fL (ref 80.0–100.0)
Monocytes Absolute: 0.7 10*3/uL (ref 0.1–1.0)
Monocytes Relative: 10 %
Neutro Abs: 3.6 10*3/uL (ref 1.7–7.7)
Neutrophils Relative %: 56 %
Platelets: 253 10*3/uL (ref 150–400)
RBC: 2.93 MIL/uL — ABNORMAL LOW (ref 3.87–5.11)
RDW: 18.6 % — ABNORMAL HIGH (ref 11.5–15.5)
WBC: 6.5 10*3/uL (ref 4.0–10.5)
nRBC: 0 % (ref 0.0–0.2)

## 2024-03-06 LAB — COMPREHENSIVE METABOLIC PANEL
ALT: 24 U/L (ref 0–44)
AST: 30 U/L (ref 15–41)
Albumin: 2.3 g/dL — ABNORMAL LOW (ref 3.5–5.0)
Alkaline Phosphatase: 165 U/L — ABNORMAL HIGH (ref 38–126)
Anion gap: 7 (ref 5–15)
BUN: 21 mg/dL (ref 8–23)
CO2: 22 mmol/L (ref 22–32)
Calcium: 8.5 mg/dL — ABNORMAL LOW (ref 8.9–10.3)
Chloride: 109 mmol/L (ref 98–111)
Creatinine, Ser: 1.27 mg/dL — ABNORMAL HIGH (ref 0.44–1.00)
GFR, Estimated: 39 mL/min — ABNORMAL LOW (ref >60)
Glucose, Bld: 137 mg/dL — ABNORMAL HIGH (ref 70–99)
Potassium: 3.8 mmol/L (ref 3.5–5.1)
Sodium: 138 mmol/L (ref 135–145)
Total Bilirubin: 0.8 mg/dL (ref 0.0–1.2)
Total Protein: 6.2 g/dL — ABNORMAL LOW (ref 6.5–8.1)

## 2024-03-06 LAB — GLUCOSE, CAPILLARY
Glucose-Capillary: 130 mg/dL — ABNORMAL HIGH (ref 70–99)
Glucose-Capillary: 119 mg/dL — ABNORMAL HIGH (ref 70–99)
Glucose-Capillary: 107 mg/dL — ABNORMAL HIGH (ref 70–99)
Glucose-Capillary: 114 mg/dL — ABNORMAL HIGH (ref 70–99)

## 2024-03-06 LAB — PHOSPHORUS: Phosphorus: 4 mg/dL (ref 2.5–4.6)

## 2024-03-06 LAB — MAGNESIUM: Magnesium: 1.8 mg/dL (ref 1.7–2.4)

## 2024-03-06 MED ORDER — DICLOFENAC SODIUM 1 % EX GEL
2.0000 g | Freq: Four times a day (QID) | CUTANEOUS | Status: DC
  Administered 2024-03-06 – 2024-03-08 (×6): 2 g via TOPICAL
  Filled 2024-03-06: qty 100

## 2024-03-06 MED ORDER — METOPROLOL SUCCINATE ER 50 MG PO TB24
50.0000 mg | ORAL_TABLET | Freq: Every day | ORAL | Status: DC
  Administered 2024-03-06 – 2024-03-08 (×3): 50 mg via ORAL
  Filled 2024-03-06 (×3): qty 1

## 2024-03-06 NOTE — TOC Initial Note (Signed)
 Transition of Care [facility]) - Initial/Assessment Note    Patient Details  Name: Kathryn Carlson MRN: [id number] Date of Birth: 12-02-1927  [facility] CM/SW Contact:    Neno Hohensee A [place], LCSW Phone Number: 03/06/2024, 11:55 AM  Clinical Narrative:                  CSW spoke with pt's daughter, as she is HCPOA and stated that she and pt were agreeable to recommendation for SNF. CSW informed her that [name] has bed available whenever pt is stable for DC.    TOC will continue to follow.   Expected Discharge Plan: Skilled Nursing Facility     Patient Goals and CMS Choice Patient states their goals for this hospitalization and ongoing recovery are:: Pt's daughter states, Rehab          Expected Discharge Plan and Services       Living arrangements for the past 2 months: Assisted Living Facility                                      Prior Living Arrangements/Services Living arrangements for the past 2 months: Assisted Living Facility Lives with:: Facility Resident          Need for Family Participation in Patient Care: Yes (Comment) Care giver support system in place?: Yes (comment) (pt's daughter)      Activities of Daily Living   ADL Screening (condition at time of admission) Independently performs ADLs?: Yes (appropriate for developmental age) Is the patient deaf or have difficulty hearing?: Yes Does the patient have difficulty seeing, even when wearing glasses/contacts?: No Does the patient have difficulty concentrating, remembering, or making decisions?: Yes  Permission Sought/Granted                  Emotional Assessment Appearance:: Appears stated age Attitude/Demeanor/Rapport: Unable to Assess Affect (typically observed): Unable to Assess Orientation: : Oriented to Self, Oriented to Place Alcohol / Substance Use: Not Applicable Psych Involvement: No (comment)  Admission diagnosis:  Generalized weakness  [R53.1] Fall, initial encounter [W19.XXXA] Urinary tract infection without hematuria, site unspecified [N39.0] Anemia, unspecified type [D64.9] Patient Active Problem List   Diagnosis Date Noted   Generalized weakness 03/04/2024   Acute on chronic blood loss anemia 03/04/2024   Hypocalcemia 03/04/2024   Hyponatremia 03/04/2024   Macrocytic anemia 03/04/2024   Acute UTI (urinary tract infection) 03/04/2024   Gluteal hematoma 03/04/2024   CAD (coronary artery disease) 03/04/2024   Frequent falls 02/29/2024   Hypokalemia 02/29/2024   Chronic lower back pain 01/14/2024   Urinary frequency 01/14/2024   Atrophic vaginitis 01/14/2024   Hypothyroidism 01/14/2024   Edema 01/14/2024   Type 2 diabetes, diet controlled (HCC) 01/14/2024   Elevated liver function tests 01/14/2024   Anemia 01/14/2024   Vitamin D deficiency 01/14/2024   Primary localized osteoarthritis of knees, bilateral 08/06/2023   NSTEMI (non-ST elevated myocardial infarction) (HCC) 07/08/2021   Insomnia 05/18/2018   Fatigue 11/30/2017   Obesity (BMI 30.0-34.9) 11/30/2017   DOE (dyspnea on exertion) 11/12/2017   Aortic stenosis, mild 02/05/2017   Frequent PVCs 06/04/2014   NSVT (nonsustained ventricular tachycardia) (HCC) 12/04/2013   HTN (hypertension) 10/25/2013   Dyslipidemia 10/25/2013   S/P CABG x 3 10/25/2013   Symptomatic bradycardia 10/25/2013   PCP:  Mast, Man X, NP Pharmacy:   [id number] - [place], [place] - [address] [place] [address] Phone: [phone] Fax: [phone]     Social Drivers of Health (SDOH) Social History: SDOH Screenings   Food Insecurity: No Food Insecurity (03/04/2024)  Housing: Unknown (03/04/2024)  Transportation Needs: No Transportation Needs (03/04/2024)  Utilities: Not At Risk (03/04/2024)  Depression (PHQ2-9): Low Risk  (01/25/2024)  Social Connections: Socially Isolated (03/04/2024)  Tobacco Use: Low Risk  (03/03/2024)   SDOH Interventions: Social  Connections Interventions: Community Resources Provided   Readmission Risk Interventions     No data to display

## 2024-03-06 NOTE — Progress Notes (Signed)
 Mobility Specialist: Progress Note   03/06/24 1219  Mobility  Activity Transferred from bed to chair  Level of Assistance Minimal assist, patient does 75% or more  Assistive Device Stedy  Activity Response Tolerated well  Mobility Referral Yes  Mobility visit 1 Mobility  Mobility Specialist Start Time (ACUTE ONLY) 0930  Mobility Specialist Stop Time (ACUTE ONLY) 0943  Mobility Specialist Time Calculation (min) (ACUTE ONLY) 13 min    Pt was pleasant and agreeable to mobility session - received in bed eager to sit in recliner. MinA for bed mobility to assist with trunk elevation and scooting EOB. MinA for STS. No complaints jsut very anxious about falling. Left in chair with all needs met, call bell in reach. Chair alarm on. Daughter in room.   Maurene Capes Mobility Specialist Please contact via SecureChat or Rehab office at [phone]

## 2024-03-06 NOTE — NC FL2 (Signed)
 [place] MEDICAID FL2 LEVEL OF CARE FORM     IDENTIFICATION  Patient Name: Kathryn Carlson Birthdate: [birth year] Sex: female Admission Date (Current Location): 03/03/2024  [facility] and [place] Number:  Producer, television/film/video and Address:  [facility], [address] [place], [place] [address]      Provider Number: [id number]  Attending Physician Name and Address:  Merlene Laughter, DO  Relative Name and Phone Number:  Hosie Poisson (Daughter)  [phone]    Current Level of Care: Hospital Recommended Level of Care: Skilled Nursing Facility Prior Approval Number:    Date Approved/Denied:   PASRR Number: [id number]  Discharge Plan: SNF    Current Diagnoses: Patient Active Problem List   Diagnosis Date Noted   Generalized weakness 03/04/2024   Acute on chronic blood loss anemia 03/04/2024   Hypocalcemia 03/04/2024   Hyponatremia 03/04/2024   Macrocytic anemia 03/04/2024   Acute UTI (urinary tract infection) 03/04/2024   Gluteal hematoma 03/04/2024   CAD (coronary artery disease) 03/04/2024   Frequent falls 02/29/2024   Hypokalemia 02/29/2024   Chronic lower back pain 01/14/2024   Urinary frequency 01/14/2024   Atrophic vaginitis 01/14/2024   Hypothyroidism 01/14/2024   Edema 01/14/2024   Type 2 diabetes, diet controlled (HCC) 01/14/2024   Elevated liver function tests 01/14/2024   Anemia 01/14/2024   Vitamin D deficiency 01/14/2024   Primary localized osteoarthritis of knees, bilateral 08/06/2023   NSTEMI (non-ST elevated myocardial infarction) (HCC) 07/08/2021   Insomnia 05/18/2018   Fatigue 11/30/2017   Obesity (BMI 30.0-34.9) 11/30/2017   DOE (dyspnea on exertion) 11/12/2017   Aortic stenosis, mild 02/05/2017   Frequent PVCs 06/04/2014   NSVT (nonsustained ventricular tachycardia) (HCC) 12/04/2013   HTN (hypertension) 10/25/2013   Dyslipidemia 10/25/2013   S/P CABG x 3 10/25/2013   Symptomatic bradycardia 10/25/2013     Orientation RESPIRATION BLADDER Height & Weight     Self, Time, Situation, Place  Normal Incontinent Weight: 160 lb 15 oz (73 kg) Height:  5\' 6"  (167.6 cm)  BEHAVIORAL SYMPTOMS/MOOD NEUROLOGICAL BOWEL NUTRITION STATUS      Continent Diet (see DC summary)  AMBULATORY STATUS COMMUNICATION OF NEEDS Skin   Extensive Assist Verbally Normal                       Personal Care Assistance Level of Assistance  Bathing, Feeding, Dressing Bathing Assistance: Maximum assistance Feeding assistance: Limited assistance Dressing Assistance: Maximum assistance     Functional Limitations Info  Sight, Hearing, Speech Sight Info: Adequate Hearing Info: Adequate Speech Info: Adequate    SPECIAL CARE FACTORS FREQUENCY  PT (By licensed PT), OT (By licensed OT)     PT Frequency: 5x/week OT Frequency: 5x/week            Contractures Contractures Info: Not present    Additional Factors Info  Code Status, Allergies Code Status Info: FULL Allergies Info: Mobic (Meloxicam)  Novocain (Procaine)           Current Medications (03/06/2024):  This is the current hospital active medication list Current Facility-Administered Medications  Medication Dose Route Frequency Provider Last Rate Last Admin   acetaminophen (TYLENOL) tablet 650 mg  650 mg Oral Q6H PRN Howerter, Justin B, DO   650 mg at 03/05/24 1239   Or   acetaminophen (TYLENOL) suppository 650 mg  650 mg Rectal Q6H PRN Howerter, Justin B, DO       aspirin EC tablet 81 mg  81  mg Oral Daily Bobette Mo, MD   81 mg at 03/06/24 5284   atorvastatin (LIPITOR) tablet 40 mg  40 mg Oral Daily Bobette Mo, MD   40 mg at 03/06/24 1324   cefTRIAXone (ROCEPHIN) 1 g in sodium chloride 0.9 % 100 mL IVPB  1 g Intravenous Q24H Bobette Mo, MD   Stopped at 03/06/24 4010   ferrous sulfate tablet 325 mg  325 mg Oral 3 times per day on Monday Wednesday Friday Bobette Mo, MD   325 mg at 03/06/24 2725    HYDROcodone-acetaminophen (NORCO/VICODIN) 5-325 MG per tablet 0.5 tablet  0.5 tablet Oral QHS Bobette Mo, MD   0.5 tablet at 03/05/24 2100   insulin aspart (novoLOG) injection 0-9 Units  0-9 Units Subcutaneous TID WC Bobette Mo, MD       levothyroxine (SYNTHROID) tablet 75 mcg  75 mcg Oral Q0600 Bobette Mo, MD   75 mcg at 03/06/24 3664   melatonin tablet 5 mg  5 mg Oral QHS PRN Gery Pray, MD       methocarbamol (ROBAXIN) tablet 500 mg  500 mg Oral Q8H PRN Marguerita Merles Latif, DO       polyethylene glycol [facility] / GLYCOLAX) packet 17 g  17 g Oral Once per day on Monday Wednesday Friday Bobette Mo, MD         Discharge Medications: Please see discharge summary for a list of discharge medications.  Relevant Imaging Results:  Relevant Lab Results:   Additional Information SSN: [id number]  Dilyn Smiles A [place], LCSW

## 2024-03-06 NOTE — Consult Note (Signed)
 Reason for Consult:cervical stenosis  Referring Physician: [facility]   Kathryn Carlson is an 89 y.o. female.   HPI:  89 year old female with a history of multiple MI's presented with frequent falls over the last couple weeks. She denies any neck pain numbness or tingling in her arms. States her hands feel cold. Has numbness in her lower extremities.  Past Medical History:  Diagnosis Date   BCC (basal cell carcinoma of skin) 01/09/2009   Lower Central Back (tx p bx)   CAD (coronary artery disease)    possible ant wall MI ('97) - cath & CABG x5   Exogenous obesity    GERD (gastroesophageal reflux disease)    History of nuclear stress test 09/2012   lexiscan; mild perfusion defect in apical anterior & apical region (infarct/scar) - no significant ischemia, low risk    History of total bilateral knee replacement    Hypertension    Hypothyroidism    Nodular basal cell carcinoma (BCC) 08/17/2017   Left Calf (tx p bx)   PVC's (premature ventricular contractions)    SCC (squamous cell carcinoma) [place] 01/29/2004   Right Forearm (Cx3,5FU)   SCCA (squamous cell carcinoma) of skin 03/13/2005   Mid Upper Back   SCCA (squamous cell carcinoma) of skin 09/07/2005   Left Elbow(Cx3,5FU)   SCCA (squamous cell carcinoma) of skin 01/25/2006   Left Brow(in situ) (Cx3,5FU)   SCCA (squamous cell carcinoma) of skin 04/27/2006   Inner Left Shoulder(Keratoacanthoma) (Exc.)   SCCA (squamous cell carcinoma) of skin 03/22/2007   Left Temple(in situ) and Bridge of Nose(in situ) (Cs3,5FU)   SCCA (squamous cell carcinoma) of skin 05/12/2007   Top of Scalp(Cx3,5FU)   SCCA (squamous cell carcinoma) of skin 01/28/2011   Right Upper Arm(in situ)   SCCA (squamous cell carcinoma) of skin 07/26/2012   Glabella, Inferior Tip (Cx3,5FU)   SCCA (squamous cell carcinoma) of skin 03/08/2013   Left Front Scalp(in situ) and Upper Nose(in situ) (Cx3,5FU)   SCCA (squamous cell carcinoma) of skin 07/24/2013   Right Lower  Leg(Keratoacanthoma)   SCCA (squamous cell carcinoma) of skin 09/11/2015   Left Scalp(in situ) (Cx3,5FU)   SCCA (squamous cell carcinoma) of skin 01/15/2016   Left Forearm(in situ) and Left Temple(in situ) (tx p bx)   SCCA (squamous cell carcinoma) of skin 10/01/2016   Left Mid Forearm(in situ)(tx p bx) and Left Front Scalp(watch)   SCCA (squamous cell carcinoma) of skin 11/12/2016   Left Temple(Keratoacanthoma) (watch)   SCCA (squamous cell carcinoma) of skin 02/11/2017   Top Left Hand(Keratoacanthoma) (tx p bx)   Squamous cell carcinoma in situ (SCCIS) 11/18/1999   Above Left Outer Eyebrow   Squamous cell carcinoma of scalp    removed - Dr. Jorja Loa   Superficial basal cell carcinoma (BCC) 07/14/2004   Left Scapula (Cx3,5FU)    Past Surgical History:  Procedure Laterality Date   ABDOMINAL HYSTERECTOMY  1984   APPENDECTOMY  1941   Carotid Doppler  12/07/2012   R & L ICA - 0-49% diameter reduction   CORONARY ARTERY BYPASS GRAFT  09/1996   cath & CABG x5 LIMA-LAD, SVG-sequential OD & OM, SVG sequential to PDA & PLA (Dr. Donata Clay)   REPLACEMENT TOTAL KNEE Bilateral 2001 & 2003   TONSILLECTOMY     TRANSTHORACIC ECHOCARDIOGRAM  08/23/2013   EF 50-55%, LV cavity size mod reduced, mild LVH, mild conc hypertrophy; mild AV stenosis & mild regurg; mild MR - ordered for murmur    Allergies  Allergen Reactions   Mobic [Meloxicam] Other (See Comments)    Unknown reaction   Novocain [Procaine] Other (See Comments)    Unknown reaction    Social History   Tobacco Use   Smoking status: Never   Smokeless tobacco: Never  Substance Use Topics   Alcohol use: Yes    Comment: "a drink of wine every now and then"    Family History  Problem Relation Age of Onset   Heart attack Mother    Heart attack Father    Cancer Sister    Diabetes Sister    Heart Problems Sister      Review of Systems  Positive ROS: as above  All other systems have been reviewed and were otherwise negative with  the exception of those mentioned in the HPI and as above.  Objective: Vital signs in last 24 hours: Temp:  [97 F (36.1 C)-97.7 F (36.5 C)] 97 F (36.1 C) (03/10 0817) Pulse Rate:  [80-88] 80 (03/10 0817) Resp:  [18] 18 (03/10 0440) BP: (123-140)/(46-96) 123/46 (03/10 0817) SpO2:  [97 %-100 %] 100 % (03/10 0817) Weight:  [73 kg] 73 kg (03/10 0500)  General Appearance: Alert, cooperative, no distress, appears stated age Head: Normocephalic, without obvious abnormality, atraumatic Eyes: PERRL, conjunctiva/corneas clear, EOM's intact, fundi benign, both eyes      Back: Symmetric, no curvature, ROM normal, no CVA tenderness Lungs: respirations unlabored Heart: Regular rate and rhythm Extremities: Extremities normal, atraumatic, no cyanosis or edema Pulses: 2+ and symmetric all extremities Skin: Skin color, texture, turgor normal, no rashes or lesions  NEUROLOGIC:   Mental status: A&O x4, no aphasia, good attention span, Memory and fund of knowledge Motor Exam - grossly normal, normal tone and bulk Sensory Exam - grossly normal Reflexes: symmetric, no pathologic reflexes, No Hoffman's, No clonus Coordination - grossly normal Gait - not tested Balance - not tested Cranial Nerves: I: smell Not tested  II: visual acuity  OS: na    OD: na  II: visual fields Full to confrontation  II: pupils Equal, round, reactive to light  III,VII: ptosis None  III,IV,VI: extraocular muscles  Full ROM  V: mastication   V: facial light touch sensation    V,VII: corneal reflex    VII: facial muscle function - upper    VII: facial muscle function - lower   VIII: hearing   IX: soft palate elevation    IX,X: gag reflex   XI: trapezius strength    XI: sternocleidomastoid strength   XI: neck flexion strength    XII: tongue strength      Data Review Lab Results  Component Value Date   WBC 6.5 03/06/2024   HGB 9.2 (L) 03/06/2024   HCT 29.1 (L) 03/06/2024   MCV 99.3 03/06/2024   PLT 253  03/06/2024   Lab Results  Component Value Date   NA 138 03/06/2024   K 3.8 03/06/2024   CL 109 03/06/2024   CO2 22 03/06/2024   BUN 21 03/06/2024   CREATININE 1.27 (H) 03/06/2024   GLUCOSE 137 (H) 03/06/2024   Lab Results  Component Value Date   INR 1.2 03/04/2024    Radiology: MR Cervical Spine W and Wo Contrast Result Date: 03/04/2024 CLINICAL DATA:  89 year old female status post fall. Age indeterminate T3 compression fracture and nonspecific epidural gas within the cervical spine by CT. EXAM: MRI CERVICAL SPINE WITHOUT AND WITH CONTRAST TECHNIQUE: Multiplanar and multiecho pulse sequences of the cervical spine, to include the craniocervical junction  and cervicothoracic junction, were obtained without and with intravenous contrast. CONTRAST:  7mL GADAVIST GADOBUTROL 1 MMOL/ML IV SOLN COMPARISON:  CT cervical spine 02/27/2024, thoracic spine 03/03/2024. FINDINGS: Intermittently motion degraded exam, especially most of the sagittal cervical imaging. Alignment: Multilevel degenerative appearing spondylolisthesis is stable, worst at C5-C6 (3-4 mm there). Vertebrae: Normal background bone marrow signal. T3 mild superior endplate compression appears chronic and No marrow edema or evidence of acute osseous abnormality. Cord: Degenerative appearing spinal cord compression at C5-C6, up to moderate (series 6, image 10). Limited spinal cord detail on this exam but no obvious spinal cord signal changes. C5 and C6 have been the epicenter of the abnormal spinal canal gas on previous CTs. See additional details below. On postcontrast axial images there is mild abnormal enhancement within the spinal canal at C5-C6, primarily in the posterior midline which appears superimposed on a small volume of gas series 14, image 32) and medial to chronically degenerated facets there (series 14, image 33) where there is rim enhancement of un intracanalicular space-occupying lesion which is 5-6 mm in thickness. Contralateral  right C5-C6 hyperdense canal space-occupying mass by CT appears to be low-density on MRI best seen on series 9, image 29, nonenhancing. There is no generalized dural thickening or enhancement. And no abnormal intradural enhancement identified. Posterior Fossa, vertebral arteries, paraspinal tissues: Mild degenerative mass effect at the cervicomedullary junction. No cervicomedullary, visible brainstem or cerebellar signal abnormality or abnormal enhancement. Preserved major vascular flow voids in the neck. No cervical paraspinal soft tissue inflammation or mass identified. Negative visible right lung apex. Disc levels: Advanced cervical spine degeneration most notable for C1-C2: Bulky ligamentous hypertrophy about the odontoid which is partially calcified by CT, superimposed on bulky C1-C2 degeneration. And there is also bulky partially calcified ligament flavum hypertrophy at that level. Combined there is C1-C2 level spinal stenosis with up to mild spinal cord mass effect (series 6 image 7). C5-C6: Spondylolisthesis of 3, up to 4 mm associated with severe bilateral facet arthropathy, bilateral posterior element hypertrophy including apparent ligamentous hypertrophy which by CT is hyperdense and possibly calcified, low signal on this exam. Abnormal mostly midline posterior epidural space gas and left side rim calcified space-occupying lesion as seen on series 14, image 33. Moderate to severe spinal stenosis and spinal cord mass effect (series 9, image 33 and series 6, image 10). Bilateral C6 foraminal stenosis is moderate to severe, worse on the right. C6-C7 and C7-T1 less pronounced spondylolisthesis. No significant spinal stenosis at those levels. Bilateral foraminal stenosis. IMPRESSION: 1. Highly abnormal cervical spine C5-C6 level. Suboptimal MRI resolution but the constellation of CT and MRI findings favors advanced degenerative etiology - such as synovial cyst(s) and/or ligament flavum hypertrophy with partial  calcification, associated vacuum phenomena - superimposed on spondylolisthesis and severe facet arthropathy. No strong evidence of spinal infection or malignancy. 2. High-grade spinal stenosis associated with #1 with cord compression there, but no spinal cord signal abnormality identified. 3. Other advanced cervical spine degeneration, most notably C1-C2 where bulky and partially calcified ligamentous hypertrophy both ventral and dorsal to the spinal cord. Spinal stenosis with less pronounced C1-C2 cord mass effect. 4. Chronic T3 superior endplate compression. No marrow edema or acute osseous abnormality identified. Electronically Signed   By: Odessa Fleming M.D.   On: 03/04/2024 04:49   CT ABDOMEN PELVIS W CONTRAST Result Date: 03/03/2024 CLINICAL DATA:  Abdominal trauma, blunt.  Multiple falls. EXAM: CT ABDOMEN AND PELVIS WITH CONTRAST TECHNIQUE: Multidetector CT imaging of the abdomen and  pelvis was performed using the standard protocol following bolus administration of intravenous contrast. RADIATION DOSE REDUCTION: This exam was performed according to the departmental dose-optimization program which includes automated exposure control, adjustment of the mA and/or kV according to patient size and/or use of iterative reconstruction technique. CONTRAST:  OMNIPAQUE IOHEXOL 300 MG/ML  SOLN COMPARISON:  09/20/2019. FINDINGS: Lower chest: The heart is enlarged and there is no pericardial effusion. Multi-vessel coronary artery calcifications are noted there is a trace left pleural effusion. Atelectasis is present at the lung bases bilaterally. Hepatobiliary: No focal liver abnormality is seen. No gallstones, gallbladder wall thickening, or biliary dilatation. Pancreas: Unremarkable. No pancreatic ductal dilatation or surrounding inflammatory changes. Spleen: Normal in size without focal abnormality. Adrenals/Urinary Tract: The adrenal glands are within normal limits. The kidneys enhance symmetrically. Renal atrophy is  noted on the left. There are scattered hypodensities in the kidneys bilaterally which are too small to further characterize. A hyperdense lesion is noted in the mid left kidney measuring 1.2 cm with attenuation of 103 Hounsfield units, likely proteinaceous or hemorrhagic cyst. No renal calculus or hydronephrosis bilaterally. There is mild bladder wall thickening. Stomach/Bowel: Stomach is within normal limits. Appendix is not seen. No evidence of bowel wall thickening, distention, or inflammatory changes. No free air or pneumatosis. Scattered diverticula are present along the colon without evidence of diverticulitis. Vascular/Lymphatic: Aortic atherosclerosis. No enlarged abdominal or pelvic lymph nodes. Reproductive: Status post hysterectomy. No adnexal masses. Other: No abdominopelvic ascites. A fat containing umbilical hernia is noted. Musculoskeletal: Scattered densities and subcutaneous fat stranding is noted over the left lateral hip, the largest measuring 4.3 x 2.6 x 4.8 cm, compatible with hematoma. Hyperdense attenuation is noted in the distal gluteal muscle on the left. Degenerative changes are present in the thoracolumbar spine and bilateral hips. Sternotomy wires are noted. IMPRESSION: 1. Subcutaneous hematoma and contusions over the left hip and likely small hematoma in the distal gluteal muscle on the left. No acute fracture is seen. 2. Trace left pleural effusion with atelectasis at the lung bases. 3. Diverticulosis without diverticulitis. 4. Aortic atherosclerosis. Electronically Signed   By: Thornell Sartorius M.D.   On: 03/03/2024 22:30   CT Thoracic Spine Wo Contrast Result Date: 03/03/2024 CLINICAL DATA:  Fall EXAM: CT THORACIC SPINE WITHOUT CONTRAST TECHNIQUE: Multidetector CT images of the thoracic were obtained using the standard protocol without intravenous contrast. RADIATION DOSE REDUCTION: This exam was performed according to the departmental dose-optimization program which includes automated  exposure control, adjustment of the mA and/or kV according to patient size and/or use of iterative reconstruction technique. COMPARISON:  None Available. FINDINGS: Alignment: Normal. Vertebrae: There is a minimally depressed wedge compression fracture of T3. No other acute osseous abnormality. Vertebral body heights are otherwise maintained. Paraspinal and other soft tissues: There is small volume pneumorachis of the lower cervical spine, similar to 02/27/2024. Disc levels: There is no spinal canal stenosis. IMPRESSION: 1. Minimally depressed wedge compression fracture of T3. 2. Small amount of epidural gas with adjacent hyperdensity within the lower cervical spine, unchanged since 02/27/2024. While this is somewhat nonspecific, epidural infection can cause this appearance. MRI with and without contrast of the cervical spine is recommended. Electronically Signed   By: Deatra Robinson M.D.   On: 03/03/2024 22:12   DG Lumbar Spine Complete Result Date: 03/03/2024 CLINICAL DATA:  Pain after fall. EXAM: LUMBAR SPINE - COMPLETE 4+ VIEW COMPARISON:  None Available. FINDINGS: Lateral views are limited by positioning. Lower aspect of the lumbar spine  is not included in the field of view. Five non-rib-bearing lumbar vertebra. Dextroscoliotic curvature. Trace retrolisthesis at tentatively L2-L3. No evidence of acute fracture or compression deformity. There is multilevel degenerative disc disease and facet hypertrophy. The sacroiliac joints are congruent. IMPRESSION: 1. Technically limited exam. Allowing for this, no acute fracture or subluxation of the lumbar spine. 2. Dextroscoliotic curvature with multilevel degenerative disc disease and facet hypertrophy. Electronically Signed   By: Narda Rutherford M.D.   On: 03/03/2024 18:42   DG Ankle Complete Left Result Date: 03/03/2024 CLINICAL DATA:  Pain after fall. EXAM: LEFT ANKLE COMPLETE - 3+ VIEW COMPARISON:  Included portion from tibia/fibular radiographs 02/27/2024  FINDINGS: There is no evidence of fracture, dislocation, or joint effusion. Ankle mortise is preserved. Mild chronic spurring of the medial and lateral malleoli. Plantar calcaneal spur and Achilles tendon enthesophyte. Vascular calcifications are seen. IMPRESSION: 1. No fracture or subluxation of the left ankle. 2. Plantar calcaneal spur and Achilles tendon enthesophyte. Electronically Signed   By: Narda Rutherford M.D.   On: 03/03/2024 18:41   CT Maxillofacial Wo Contrast Result Date: 03/03/2024 CLINICAL DATA:  Facial trauma, blunt.  Multiple falls. EXAM: CT MAXILLOFACIAL WITHOUT CONTRAST TECHNIQUE: Multidetector CT imaging of the maxillofacial structures was performed. Multiplanar CT image reconstructions were also generated. RADIATION DOSE REDUCTION: This exam was performed according to the departmental dose-optimization program which includes automated exposure control, adjustment of the mA and/or kV according to patient size and/or use of iterative reconstruction technique. COMPARISON:  CT head without contrast 02/27/2024. FINDINGS: Osseous: No acute fractures are present. Mandible is intact and located. Prominent soft tissue pannus is present about the dens with some erosive changes consistent with rheumatoid arthritis. No acute fractures are present. Orbits: Bilateral lens replacements are noted. Globes and orbits are otherwise unremarkable. Sinuses: The paranasal sinuses and mastoid air cells are clear. Soft tissues: The left supraorbital scalp hematoma seen previously is near completely resolved. No underlying fracture is present. No new soft tissue injury is present. Limited intracranial: Within normal limits for age.  Stable. IMPRESSION: 1. No acute fracture or acute injury to the facial bones. 2. Near complete resolution of left supraorbital scalp hematoma seen previously. 3. Prominent soft tissue pannus about the dens with some erosive changes consistent with rheumatoid arthritis. Electronically  Signed   By: Marin Roberts M.D.   On: 03/03/2024 17:25     Assessment/Plan: 89 year old female presented with frequent falls. MRI c spine shows severe spinal cord compression at C5-6 with multilevel spondylosis. This could be contributing to her frequent falls. However give her age and medical history she would not be a surgical candidate. Would recommend PT and OT to help with gait and stability. Plan is for her to be discharged to SNF. Spoke with daughter and she agrees with the plan.    Kathryn Carlson 03/06/2024 5:20 PM

## 2024-03-06 NOTE — Progress Notes (Signed)
 PROGRESS NOTE    Kathryn BASSETTE  Carlson:[id number] DOB: 20-Dec-1927 DOA: 03/03/2024 PCP: Mast, Man X, NP   Brief Narrative:  The patient is a 89 year old Caucasian female with a past medical history significant for basal cell carcinoma, CAD status post CABG, history of MI, hypertension, GERD, hypothyroidism, was brought to the ED given lower extremity weakness with recurrent multiple falls.  She has been following and developed a hematoma, the left periorbital area after a fall in her facility.  She has been having trouble weightbearing on her left lower extremity due to the pain.  Further workup was done and shows a UTI and fecal occult blood negative.  She has a slight minimally displaced wedge fracture of T3 but no pain.  PT OT recommending SNF now. Blood count appears stable and anticipating discharging to SNF in the next 24 hours if remains stable.   Assessment and Plan:  Generalized weakness and Recurrent Falls In the setting of  Acute UTI (urinary tract infection): Admit to telemetry/inpatient.  Urinalysis done showed a cloudy appearance with amber color urine, small hemoglobin, moderate leukocytes, negative nitrites, few bacteria, greater than 50 WBCs with urine culture showing multiple species present so we will recollect but never done. C/w ceftriaxone 1 g IVPB daily. Follow urine culture and sensitivity. Follow-up CBC and chemistry in the morning.   Hyponatremia: Improved;Minimally decreased at 133 on admission and now 138. CTM and Trend and repeat CMP in the AM   Gluteal hematoma Superimposed on Macrocytic anemia Leading to Acute on chronic blood loss Anemia -Anemia Panel Showed an iron level of 30, UIBC 200, TIBC 230, saturation ratio 13%, ferritin of 102 -Negative occult blood in stool. Transfuse 1 unit of PRBC. Hgb/Hct Trend: Recent Labs  Lab 03/03/24 1554 03/04/24 0849 03/04/24 1939 03/05/24 1817 03/06/24 0927  HGB 7.3* 6.9* 8.8* 9.6* 9.2*  HCT 24.1* 21.9* 27.6* 30.3* 29.1*   MCV 104.8*  --   --  101.7* 99.3  -Clopidogrel and looks like her hemoglobin is rebound nicely after transfusion. -CTM for S/Sx of Bleeding; No overt bleeding noted. Repeat CBC in the AM   Essential HTN (hypertension): Soft BP readings initially..Hold Amlodipine and resume BB at half the dose as below. CTM BP per Protocol; Last BP reading was 123/46   CAD (coronary artery disease): Status post CABG. Continue statin. Resume beta-blocker but at half the dose at 50 mg of Metoprolol Suxxinate. Continue to hold CCB and consider resumption tomorrow. And Monitor for CP   Dyslipidemia / Aortic Stenosis, mild: Continue atorvastatin 40 mg p.o. daily.   Hypothyroidism: Check TSH in the AM; Continue Levothyroxine 75 mcg p.o. daily.   Type 2 Diabetes Mellitus, Diet controlled (HCC): Carbohydrate modified diet. CBG monitoring with Sensitive Novolog SSI. Check HbA1c. CBGs ranging from 119-156 on last 5 checks  CKD Stage 3b: BUN/Cr Trend showing Recent Labs  Lab 03/03/24 1554 03/05/24 1817 03/06/24 0927  BUN 25* 27* 21  CREATININE 1.18* 1.09* 1.27*  -Avoid Nephrotoxic Medications, Contrast Dyes, Hypotension and Dehydration to Ensure Adequate Renal Perfusion and will need to Renally Adjust Meds. CTM and Trend Renal Function carefully and repeat CMP in the AM    Hypocalcemia: Recheck calcium with Albumin level in AM.  T3 Compression Fx:  Minimally depressed wedge compression fracture of T3 noted on CT Scan; Not having Sx but will need Pain control if necessary   Cervical Spine Abnormality: CT showed "Small amount of epidural gas with adjacent hyperdensity within the lower cervical spine, unchanged since  02/27/2024. While this is somewhat nonspecific, epidural infection can cause this appearance." -MRI Cervical Spine done and showed "Highly abnormal cervical spine C5-C6 level. Suboptimal MRI resolution but the constellation of CT and MRI findings favors advanced degenerative etiology - such as synovial  cyst(s) and/or ligament flavum hypertrophy with partial calcification, associated vacuum phenomena - superimposed on spondylolisthesis and severe facet arthropathy. No strong evidence of spinal infection or malignancy.. High-grade spinal stenosis associated with #1 with cord compression there, but no spinal cord signal abnormality identified. Other advanced cervical spine degeneration, most notably C1-C2 where bulky and partially calcified ligamentous hypertrophy both ventral and dorsal to the spinal cord. Spinal stenosis with less pronounced C1-C2 cord mass effect.. Chronic T3 superior endplate compression. No marrow edema or acute osseous abnormality identified." -Discussing with Neurosurgery and obtaining their opinion   Hypoalbuminemia: Patient's Albumin ranging from 2.3-2.4. CTM and Trend and repeat CMP in the AM    DVT prophylaxis: SCDs Start: 03/04/24 0557    Code Status: Full Code Family Communication: No family present at bedside   Disposition Plan:  Level of care: Telemetry Medical Status is: Inpatient Remains inpatient appropriate because: PT OT recommending SNF and anticipating discharge in the next 24 hours.     Consultants:  Discussing with Neurosurgery  Procedures:  As delineated as above  Antimicrobials:  Anti-infectives (From admission, onward)    Start     Dose/Rate Route Frequency Ordered Stop   03/04/24 2300  cefTRIAXone (ROCEPHIN) 1 g in sodium chloride 0.9 % 100 mL IVPB        1 g 200 mL/hr over 30 Minutes Intravenous Every 24 hours 03/04/24 0800     03/04/24 0000  cefTRIAXone (ROCEPHIN) 1 g in sodium chloride 0.9 % 100 mL IVPB        1 g 200 mL/hr over 30 Minutes Intravenous  Once 03/03/24 2345 03/04/24 1610       Subjective: Seen and examined at bedside and she was doing okay but still complaining about some left ankle pain and states that is just intermittently hurting.  No nausea or vomiting. Very hard of hearing  Objective: Vitals:   03/05/24 2001  03/06/24 0440 03/06/24 0500 03/06/24 0817  BP: (!) 138/56 (!) 140/60  (!) 123/46  Pulse: 87 88  80  Resp: 18 18    Temp: 97.7 F (36.5 C) 97.7 F (36.5 C)  (!) 97 F (36.1 C)  TempSrc:    Oral  SpO2: 98% 97%  100%  Weight:   73 kg   Height:        Intake/Output Summary (Last 24 hours) at 03/06/2024 1705 Last data filed at 03/06/2024 0429 Gross per 24 hour  Intake 100 ml  Output 400 ml  Net -300 ml   Filed Weights   03/03/24 1355 03/05/24 0500 03/06/24 0500  Weight: 70 kg 75.9 kg 73 kg   Examination: Physical Exam:  Constitutional: Thin elderly chronically ill-appearing Caucasian female who is extremely hard of hearing Respiratory: Diminished to auscultation bilaterally with some coarse breath sounds, no wheezing, rales, rhonchi or crackles. Normal respiratory effort and patient is not tachypenic. No accessory muscle use.  Unlabored breathing Cardiovascular: RRR, 3 out of 6 systolic murmur.  No extremity edema.  Abdomen: Soft, non-tender, non-distended. Bowel sounds positive.  GU: Deferred. Musculoskeletal: No clubbing / cyanosis of digits/nails. No joint deformity upper and lower extremities. Skin: Has been oozing noted on her face around her ankle and her back. Neurologic: She is extremely hard of hearing.  Cranial nerves II through XII otherwise grossly intact. Psychiatric: She is awake and alert and in no acute distress  Data Reviewed: I have personally reviewed following labs and imaging studies  CBC: Recent Labs  Lab 03/03/24 1554 03/04/24 0849 03/04/24 1939 03/05/24 1817 03/06/24 0927  WBC 9.5  --   --  7.7 6.5  NEUTROABS  --   --   --  3.9 3.6  HGB 7.3* 6.9* 8.8* 9.6* 9.2*  HCT 24.1* 21.9* 27.6* 30.3* 29.1*  MCV 104.8*  --   --  101.7* 99.3  PLT 232  --   --  233 253   Basic Metabolic Panel: Recent Labs  Lab 03/03/24 1554 03/05/24 1817 03/06/24 0927  NA 133* 137 138  K 4.0 4.0 3.8  CL 104 106 109  CO2 20* 17* 22  GLUCOSE 129* 143* 137*  BUN 25*  27* 21  CREATININE 1.18* 1.09* 1.27*  CALCIUM 8.4* 8.7* 8.5*  MG  --  1.9 1.8  PHOS  --  4.1 4.0   GFR: Estimated Creatinine Clearance: 27.7 mL/min (A) (by C-G formula based on SCr of 1.27 mg/dL (H)). Liver Function Tests: Recent Labs  Lab 03/05/24 1817 03/06/24 0927  AST 39 30  ALT 26 24  ALKPHOS 165* 165*  BILITOT 0.8 0.8  PROT 6.3* 6.2*  ALBUMIN 2.4* 2.3*   No results for input(s): "LIPASE", "AMYLASE" in the last 168 hours. No results for input(s): "AMMONIA" in the last 168 hours. Coagulation Profile: Recent Labs  Lab 03/04/24 0849  INR 1.2   Cardiac Enzymes: No results for input(s): "CKTOTAL", "CKMB", "CKMBINDEX", "TROPONINI" in the last 168 hours. BNP (last 3 results) No results for input(s): "PROBNP" in the last 8760 hours. HbA1C: No results for input(s): "HGBA1C" in the last 72 hours. CBG: Recent Labs  Lab 03/05/24 1232 03/05/24 1600 03/05/24 2228 03/06/24 0847 03/06/24 1201  GLUCAP 115* 156* 127* 130* 119*   Lipid Profile: No results for input(s): "CHOL", "HDL", "LDLCALC", "TRIG", "CHOLHDL", "LDLDIRECT" in the last 72 hours. Thyroid Function Tests: No results for input(s): "TSH", "T4TOTAL", "FREET4", "T3FREE", "THYROIDAB" in the last 72 hours. Anemia Panel: Recent Labs    03/04/24 0849  FERRITIN 102  TIBC 230*  IRON 30   Sepsis Labs: No results for input(s): "PROCALCITON", "LATICACIDVEN" in the last 168 hours.  Recent Results (from the past 240 hours)  Urine Culture     Status: Abnormal   Collection Time: 03/03/24  2:24 PM   Specimen: Urine, Clean Catch  Result Value Ref Range Status   Specimen Description   Final    URINE, CLEAN CATCH Performed at [facility], [address] [place], [place] [address]    Special Requests   Final    NONE Performed at [facility], [address] [place], [place] [address]    Culture MULTIPLE SPECIES PRESENT, SUGGEST RECOLLECTION (A)  Final   Report Status  03/05/2024 FINAL  Final  MRSA Next Gen by PCR, Nasal     Status: Abnormal   Collection Time: 03/04/24 12:42 PM   Specimen: Nasal Mucosa; Nasal Swab  Result Value Ref Range Status   MRSA by PCR Next Gen DETECTED (A) NOT DETECTED Final    Comment: (NOTE) The GeneXpert MRSA Assay (FDA approved for NASAL specimens only), is one component of a comprehensive MRSA colonization surveillance program. It is not intended to diagnose MRSA infection nor to guide or monitor treatment for MRSA infections. Test performance is not FDA approved  in patients less than 80 years old. Performed at [facility], [address], [place], [place] [address]     Radiology Studies: No results found.  Scheduled Meds:  aspirin EC  81 mg Oral Daily   atorvastatin  40 mg Oral Daily   diclofenac Sodium  2 g Topical QID   ferrous sulfate  325 mg Oral 3 times per day on Monday Wednesday Friday   HYDROcodone-acetaminophen  0.5 tablet Oral QHS   insulin aspart  0-9 Units Subcutaneous TID WC   levothyroxine  75 mcg Oral Q0600   metoprolol succinate  50 mg Oral Daily   polyethylene glycol  17 g Oral Once per day on Monday Wednesday Friday   Continuous Infusions:  cefTRIAXone (ROCEPHIN)  IV Stopped (03/06/24 6045)    LOS: 1 day   Marguerita Merles, DO [facility] Available via Epic secure chat 7am-7pm After these hours, please refer to coverage provider listed on [email or web address] 03/06/2024, 5:05 PM

## 2024-03-07 DIAGNOSIS — D62 Acute posthemorrhagic anemia: Secondary | ICD-10-CM | POA: Diagnosis not present

## 2024-03-07 DIAGNOSIS — R531 Weakness: Secondary | ICD-10-CM | POA: Diagnosis not present

## 2024-03-07 DIAGNOSIS — N390 Urinary tract infection, site not specified: Secondary | ICD-10-CM | POA: Diagnosis not present

## 2024-03-07 DIAGNOSIS — I350 Nonrheumatic aortic (valve) stenosis: Secondary | ICD-10-CM | POA: Diagnosis not present

## 2024-03-07 LAB — CBC WITH DIFFERENTIAL/PLATELET
Abs Immature Granulocytes: 0.02 10*3/uL (ref 0.00–0.07)
Basophils Absolute: 0.1 10*3/uL (ref 0.0–0.1)
Basophils Relative: 1 %
Eosinophils Absolute: 0.3 10*3/uL (ref 0.0–0.5)
Eosinophils Relative: 5 %
HCT: 27.9 % — ABNORMAL LOW (ref 36.0–46.0)
Hemoglobin: 9 g/dL — ABNORMAL LOW (ref 12.0–15.0)
Immature Granulocytes: 0 %
Lymphocytes Relative: 34 %
Lymphs Abs: 2 10*3/uL (ref 0.7–4.0)
MCH: 32.3 pg (ref 26.0–34.0)
MCHC: 32.3 g/dL (ref 30.0–36.0)
MCV: 100 fL (ref 80.0–100.0)
Monocytes Absolute: 0.6 10*3/uL (ref 0.1–1.0)
Monocytes Relative: 10 %
Neutro Abs: 3 10*3/uL (ref 1.7–7.7)
Neutrophils Relative %: 50 %
Platelets: 233 10*3/uL (ref 150–400)
RBC: 2.79 MIL/uL — ABNORMAL LOW (ref 3.87–5.11)
RDW: 18.4 % — ABNORMAL HIGH (ref 11.5–15.5)
WBC: 5.9 10*3/uL (ref 4.0–10.5)
nRBC: 0 % (ref 0.0–0.2)

## 2024-03-07 LAB — TSH: TSH: 6.214 u[IU]/mL — ABNORMAL HIGH (ref 0.350–4.500)

## 2024-03-07 LAB — GLUCOSE, CAPILLARY
Glucose-Capillary: 136 mg/dL — ABNORMAL HIGH (ref 70–99)
Glucose-Capillary: 101 mg/dL — ABNORMAL HIGH (ref 70–99)
Glucose-Capillary: 133 mg/dL — ABNORMAL HIGH (ref 70–99)
Glucose-Capillary: 133 mg/dL — ABNORMAL HIGH (ref 70–99)
Glucose-Capillary: 104 mg/dL — ABNORMAL HIGH (ref 70–99)

## 2024-03-07 LAB — COMPREHENSIVE METABOLIC PANEL
ALT: 23 U/L (ref 0–44)
AST: 26 U/L (ref 15–41)
Albumin: 2.3 g/dL — ABNORMAL LOW (ref 3.5–5.0)
Alkaline Phosphatase: 162 U/L — ABNORMAL HIGH (ref 38–126)
Anion gap: 4 — ABNORMAL LOW (ref 5–15)
BUN: 25 mg/dL — ABNORMAL HIGH (ref 8–23)
CO2: 21 mmol/L — ABNORMAL LOW (ref 22–32)
Calcium: 8 mg/dL — ABNORMAL LOW (ref 8.9–10.3)
Chloride: 110 mmol/L (ref 98–111)
Creatinine, Ser: 1.08 mg/dL — ABNORMAL HIGH (ref 0.44–1.00)
GFR, Estimated: 48 mL/min — ABNORMAL LOW (ref >60)
Glucose, Bld: 101 mg/dL — ABNORMAL HIGH (ref 70–99)
Potassium: 3.8 mmol/L (ref 3.5–5.1)
Sodium: 135 mmol/L (ref 135–145)
Total Bilirubin: 0.6 mg/dL (ref 0.0–1.2)
Total Protein: 5.7 g/dL — ABNORMAL LOW (ref 6.5–8.1)

## 2024-03-07 LAB — PHOSPHORUS: Phosphorus: 3.7 mg/dL (ref 2.5–4.6)

## 2024-03-07 LAB — MAGNESIUM: Magnesium: 1.8 mg/dL (ref 1.7–2.4)

## 2024-03-07 MED ORDER — MUPIROCIN 2 % EX OINT
1.0000 | TOPICAL_OINTMENT | Freq: Two times a day (BID) | CUTANEOUS | Status: DC
  Administered 2024-03-07 – 2024-03-08 (×3): 1 via NASAL
  Filled 2024-03-07 (×3): qty 22

## 2024-03-07 MED ORDER — METHOCARBAMOL 500 MG PO TABS: 500.0000 mg | ORAL_TABLET | Freq: Three times a day (TID) | ORAL | 0 refills | Status: DC | PRN

## 2024-03-07 MED ORDER — METOPROLOL SUCCINATE ER 50 MG PO TB24: 50.0000 mg | ORAL_TABLET | Freq: Every day | ORAL | 0 refills | Status: DC

## 2024-03-07 MED ORDER — HYDROCODONE-ACETAMINOPHEN 5-325 MG PO TABS: 0.5000 | ORAL_TABLET | Freq: Four times a day (QID) | ORAL | 0 refills | Status: DC | PRN

## 2024-03-07 MED ORDER — DICLOFENAC SODIUM 1 % EX GEL: 2.0000 g | Freq: Four times a day (QID) | CUTANEOUS | 0 refills | Status: DC

## 2024-03-07 MED ORDER — CHLORHEXIDINE GLUCONATE CLOTH 2 % EX PADS
6.0000 | MEDICATED_PAD | Freq: Every day | CUTANEOUS | Status: DC
  Administered 2024-03-07 – 2024-03-08 (×2): 6 via TOPICAL

## 2024-03-07 MED ORDER — CEFDINIR 300 MG PO CAPS: 300.0000 mg | ORAL_CAPSULE | Freq: Two times a day (BID) | ORAL | 0 refills | Status: AC

## 2024-03-07 MED ORDER — MAGNESIUM SULFATE 2 GM/50ML IV SOLN
2.0000 g | Freq: Once | INTRAVENOUS | Status: AC
  Administered 2024-03-07: 2 g via INTRAVENOUS
  Filled 2024-03-07: qty 50

## 2024-03-07 NOTE — Plan of Care (Signed)

## 2024-03-07 NOTE — Progress Notes (Signed)
   03/07/24 1940  Vitals  Temp 97.7 F (36.5 C)  Temp Source Oral  BP (!) 133/58  MAP (mmHg) 81  BP Location Right Arm  BP Method Automatic  Patient Position (if appropriate) Sitting  Pulse Rate 78  Pulse Rate Source Monitor  Resp 18  Level of Consciousness  Level of Consciousness Alert  MEWS COLOR  MEWS Score Color Green  Oxygen Therapy  SpO2 100 %  O2 Device Room Air  MEWS Score  MEWS Temp 0  MEWS Systolic 0  MEWS Pulse 0  MEWS RR 0  MEWS LOC 0  MEWS Score 0    Patient is A&O x 3, forgetful and hard of hearing. VSS, on room air. C/o back pain and schedule medication given as ordered. Sinus rhythm with frequent bigeminy noted on the cardiac monitor, pt asymptomatic.  Safety maintained. Bed alarm on. Call bell in reach. Will continue to monitor.

## 2024-03-07 NOTE — Progress Notes (Signed)
 Physical Therapy Treatment Patient Details Name: Kathryn Carlson MRN: [id number] DOB: [birth year] Today's Date: 03/07/2024   History of Present Illness Patient is a 89 y/o female admitted 03/03/24 from her ALF with recent multiple falls and L LE weakness,  Found to have UTI, L hip subcutaneous hematoma but no acute fracture, and pt with periorbital hematoma from fracture seen in ED a week ago due to fall without issues on her scans then, though does have T3 compression fracture of unknown age on thoracic CT.  PMH positive for CAD, GERD, HTN, hypothyroid, multiple SCC skin removal, and h/o B TKA.    PT Comments  Patient resting in recliner at start of session and agreeable to mobilize with PT. Session focused on transfer training with RW and pt able to complete repeated sit<>stands with mod+2 assist to power up. Cues for hand placement with power up and pt requiring increased Mod-Max assist as fatigued. Pt able to maintain standing and take small steps forward/backward with RW, Lt knee flexing/buckling occasionally. Pt able to use UE's and Mod+2 assist from therapist to prevent buckling fully and and regain upright posture. Pt completed 2x stand step transfer chair>bed>chair with max +2 assist and RW. EOS repositioned in recliner and encouraged to remain OOB. Will continue to progress pt as able in acute setting. Patient will benefit from continued inpatient follow up therapy, <3 hours/day.    If plan is discharge home, recommend the following: Two people to help with walking and/or transfers;A lot of help with bathing/dressing/bathroom;Help with stairs or ramp for entrance;Assist for transportation;Assistance with cooking/housework;Supervision due to cognitive status   Can travel by private vehicle     No  Equipment Recommendations  None recommended by PT    Recommendations for Other Services       Precautions / Restrictions Precautions Precautions: Fall Recall of Precautions/Restrictions:  Intact Precaution/Restrictions Comments: watch BP Restrictions Weight Bearing Restrictions Per Provider Order: No     Mobility  Bed Mobility               General bed mobility comments: OOB in recliner    Transfers Overall transfer level: Needs assistance Equipment used: Rolling walker (2 wheels) Transfers: Sit to/from Stand, Bed to chair/wheelchair/BSC Sit to Stand: Mod assist, Max assist, +2 physical assistance, +2 safety/equipment   Step pivot transfers: Mod assist, +2 physical assistance, +2 safety/equipment, Max assist       General transfer comment: Mod+2 for initial stand fading to Max+2 as fatigued. pt required mod-max +2 to guide RW for chair<>bed and maintain upright posture. pt required cues to maintain upright posture throughout.    Ambulation/Gait                   Stairs             Wheelchair Mobility     Tilt Bed    Modified Rankin (Stroke Patients Only)       Balance Overall balance assessment: Needs assistance Sitting-balance support: Feet supported Sitting balance-Leahy Scale: Fair Sitting balance - Comments: edge of chair with CGA Postural control: Posterior lean Standing balance support: Bilateral upper extremity supported Standing balance-Leahy Scale: Poor                              Communication Communication Communication: Impaired Factors Affecting Communication: Hearing impaired  Cognition Arousal: Alert Behavior During Therapy: WFL for tasks assessed/performed   PT - Cognitive impairments: History of cognitive  impairments                         Following commands: Intact      Cueing Cueing Techniques: Verbal cues  Exercises      General Comments        Pertinent Vitals/Pain Pain Assessment Pain Assessment: Faces Faces Pain Scale: No hurt Breathing: normal Negative Vocalization: none Facial Expression: smiling or inexpressive Body Language: relaxed Consolability: no  need to console PAINAD Score: 0 Pain Intervention(s): Monitored during session, Limited activity within patient's tolerance    Home Living                          Prior Function            PT Goals (current goals can now be found in the care plan section) Acute Rehab PT Goals Patient Stated Goal: return to ALF eventually PT Goal Formulation: With patient/family Time For Goal Achievement: 03/19/24 Potential to Achieve Goals: Fair Progress towards PT goals: Progressing toward goals    Frequency    Min 2X/week      PT Plan      Co-evaluation              AM-PAC PT "6 Clicks" Mobility   Outcome Measure  Help needed turning from your back to your side while in a flat bed without using bedrails?: A Lot Help needed moving from lying on your back to sitting on the side of a flat bed without using bedrails?: A Lot Help needed moving to and from a bed to a chair (including a wheelchair)?: Total Help needed standing up from a chair using your arms (e.g., wheelchair or bedside chair)?: Total Help needed to walk in hospital room?: Total Help needed climbing 3-5 steps with a railing? : Total 6 Click Score: 8    End of Session Equipment Utilized During Treatment: Gait belt Activity Tolerance: Patient limited by fatigue Patient left: in chair;with call bell/phone within reach;with family/visitor present;with chair alarm set Nurse Communication: Need for lift equipment;Mobility status PT Visit Diagnosis: Other abnormalities of gait and mobility (R26.89);Repeated falls (R29.6);Muscle weakness (generalized) (M62.81)     Time: 1610-9604 PT Time Calculation (min) (ACUTE ONLY): 20 min  Charges:    $Therapeutic Activity: 8-22 mins PT General Charges $$ ACUTE PT VISIT: 1 Visit                     Wynn Maudlin, DPT Acute Rehabilitation Services Office [phone]  03/07/24 4:06 PM

## 2024-03-07 NOTE — Progress Notes (Signed)
 Mobility Specialist: Progress Note   03/07/24 1211  Mobility  Activity Transferred from bed to chair  Level of Assistance Contact guard assist, steadying assist  Assistive Device Stedy  Activity Response Tolerated well  Mobility Referral Yes  Mobility visit 1 Mobility  Mobility Specialist Start Time (ACUTE ONLY) 1115  Mobility Specialist Stop Time (ACUTE ONLY) 1125  Mobility Specialist Time Calculation (min) (ACUTE ONLY) 10 min    Pt was agreeable to mobility session - received in bed. Less motivated to mobilize today but willing to sit up in the chair for awhile. MinA for bed mobility, minG for first STS from bed to stedy, SV for second STS from stedy to chair. C/o bil foot numbness. Left in chair with all needs met, call bell in reach. Daughter in room.   Maurene Capes Mobility Specialist Please contact via SecureChat or Rehab office at [phone]

## 2024-03-07 NOTE — TOC Progression Note (Signed)
 Transition of Care [facility]) - Progression Note    Patient Details  Name: Kathryn Carlson MRN: [id number] Date of Birth: Feb 14, 1927  [facility] CM/SW Contact  Shavon Zenz A [place], LCSW Phone Number: 03/07/2024, 4:38 PM  Clinical Narrative:     Pt can DC to SNF at [facility] tomorrow, CSW spoke with Lajoyce Corners at facility to confirm. Provider notified.    TOC will continue to follow.   Expected Discharge Plan: Skilled Nursing Facility    Expected Discharge Plan and Services       Living arrangements for the past 2 months: Assisted Living Facility Expected Discharge Date: 03/07/24                                     Social Determinants of Health (SDOH) Interventions SDOH Screenings   Food Insecurity: No Food Insecurity (03/04/2024)  Housing: Unknown (03/04/2024)  Transportation Needs: No Transportation Needs (03/04/2024)  Utilities: Not At Risk (03/04/2024)  Depression (PHQ2-9): Low Risk  (01/25/2024)  Social Connections: Socially Isolated (03/04/2024)  Tobacco Use: Low Risk  (03/03/2024)    Readmission Risk Interventions     No data to display

## 2024-03-07 NOTE — Progress Notes (Signed)
   03/06/24 2104  Vitals  Temp 97.6 F (36.4 C)  Temp Source Oral  BP (!) 139/58  MAP (mmHg) 82  BP Location Left Arm  BP Method Automatic  Patient Position (if appropriate) Sitting  Pulse Rate 91  Pulse Rate Source Monitor  Resp 18  Level of Consciousness  Level of Consciousness Alert  MEWS COLOR  MEWS Score Color Green  Oxygen Therapy  SpO2 98 %  O2 Device Room Air  MEWS Score  MEWS Temp 0  MEWS Systolic 0  MEWS Pulse 0  MEWS RR 0  MEWS LOC 0  MEWS Score 0   Patient is A&O x 3, forgetful and hard of hearing. VSS, on room air. Sinus rhythm with frequent bigeminy/PVC's on the cardiac monitor. Pt asymptomatic. Using Stedy to transfer pt from chair to bed and pt tolerated well.  Bed alarm on. Call bell in reach. Will continue to monitor.

## 2024-03-07 NOTE — Discharge Summary (Addendum)
 Physician Discharge Summary   Patient: Kathryn Carlson MRN: [id number] DOB: 23-Oct-1927  Admit date:     03/03/2024  Discharge date: 03/07/24  Discharge Physician: Marguerita Merles, DO   PCP: Mast, Man X, NP   Recommendations at discharge:   Follow-up with PCP within 1 to 2 weeks and repeat CBC, CMP, mag, Phos within 1 week Follow-up with Neurosurgery in outpatient setting if necessary Follow-up Thyroid Function studies in 4 to 6 weeks Be referred to Hematology in case hemoglobin continue to drop in outpatient setting  Discharge Diagnoses: Principal Problem:   Generalized weakness Active Problems:   HTN (hypertension)   Dyslipidemia   Aortic stenosis, mild   Hypothyroidism   Type 2 diabetes, diet controlled (HCC)   Acute on chronic blood loss anemia   Hypocalcemia   Hyponatremia   Macrocytic anemia   Acute UTI (urinary tract infection)   Gluteal hematoma   CAD (coronary artery disease)  Resolved Problems:   * No resolved hospital problems. [facility] Course: The patient is a 89 year old Caucasian female with a past medical history significant for basal cell carcinoma, CAD status post CABG, history of MI, hypertension, GERD, hypothyroidism, was brought to the ED given lower extremity weakness with recurrent multiple falls.  She has been following and developed a hematoma, the left periorbital area after a fall in her facility.  She has been having trouble weightbearing on her left lower extremity due to the pain.  Further workup was done and shows a UTI and fecal occult blood negative.  She has a slight minimally displaced wedge fracture of T3 but no pain.  PT OT recommending SNF now. Blood count appears stable she is stable to discharge to SNF and will need continued therapy and follow-up with PCP and and possibly hematology follow-up for blood count if continues to be on lower side in outpatient setting.  Assessment and Plan:  Generalized weakness and Recurrent Falls In the  setting of  Acute UTI (urinary tract infection): Admit to telemetry/inpatient.  Urinalysis done showed a cloudy appearance with amber color urine, small hemoglobin, moderate leukocytes, negative nitrites, few bacteria, greater than 50 WBCs with urine culture showing multiple species present so we will recollect but never done. C/w ceftriaxone 1 g IVPB daily changed to p.o. cefdinir for completion of course. Follow urine culture and sensitivity however shows multiple species.  Continue monitor and follow-up in outpatient setting within 1 to 2 weeks Hyponatremia: Improved;Minimally decreased at 133 on admission and now 138 -> 135. CTM and Trend and repeat CMP w/in 1 week   Gluteal hematoma Superimposed on Macrocytic anemia Leading to Acute on chronic blood loss Anemia -Anemia Panel Showed an iron level of 30, UIBC 200, TIBC 230, saturation ratio 13%, ferritin of 102 -Negative occult blood in stool. Transfuse 1 unit of PRBC. Hgb/Hct dropped to 6.9/21.9 and now relatively stable (the last 4 checks where Hb is ~ 9. -No longer taking Clopidogrel. Will CTM for S/Sx of Bleeding; No overt bleeding noted. Repeat CBC within 1 week and if necessary refer to hematology.   Essential HTN (hypertension): Soft BP readings initially..Hold Amlodipine and resume BB at half the dose as below. CTM BP per Protocol; Last BP reading was 125/54   CAD (coronary artery disease): Status post CABG. Continue statin. Resume beta-blocker but at half the dose at 50 mg of Metoprolol Suxxinate. Continue to hold CCB and consider resumption at discharge and monitor for any chest pain   Dyslipidemia / Aortic Stenosis, mild:  Continue Atorvastatin 40 mg p.o. daily.   Hypothyroidism: TSH was 6.214; Continue Levothyroxine 75 mcg p.o. daily and may need to be increased in outpatient setting when repeat thyroid function studies in 4 to 6 weeks.   Type 2 Diabetes Mellitus, Diet controlled (HCC): Carbohydrate modified diet. CBG monitoring with  Sensitive Novolog SSI. Check HbA1c. CBGs ranging from 101-136 on last 5 checks  CKD Stage 3b / Metabolic Acidosis: Stable as BUN/Cr now 25/1.08 today. Has a slight metabolic acidosis with a CO2 of 21, anion gap of 4, chloride level 110 -Avoid Nephrotoxic Medications, Contrast Dyes, Hypotension and Dehydration to Ensure Adequate Renal Perfusion and will need to Renally Adjust Meds. CTM and Trend Renal Function carefully and repeat CMP in the AM    Hypocalcemia: Recheck calcium with Albumin level in outpatient setting as corrected calcium was 9.4  T3 Compression Fx:  Minimally depressed wedge compression fracture of T3 noted on CT Scan; Not having Sx but will need Pain control if necessary   Cervical Spine Abnormality: CT showed "Small amount of epidural gas with adjacent hyperdensity within the lower cervical spine, unchanged since 02/27/2024. While this is somewhat nonspecific, epidural infection can cause this appearance." -MRI Cervical Spine done and showed "Highly abnormal cervical spine C5-C6 level. Suboptimal MRI resolution but the constellation of CT and MRI findings favors advanced degenerative etiology - such as synovial cyst(s) and/or ligament flavum hypertrophy with partial calcification, associated vacuum phenomena - superimposed on spondylolisthesis and severe facet arthropathy. No strong evidence of spinal infection or malignancy.. High-grade spinal stenosis associated with #1 with cord compression there, but no spinal cord signal abnormality identified. Other advanced cervical spine degeneration, most notably C1-C2 where bulky and partially calcified ligamentous hypertrophy both ventral and dorsal to the spinal cord. Spinal stenosis with less pronounced C1-C2 cord mass effect.. Chronic T3 superior endplate compression. No marrow edema or acute osseous abnormality identified." -Neurosurgery evaluated and they feel that her severe spinal cord compression at C5-C6 with multi level spondylosis  could be contributing her frequent falls but given her age and medical history she is not a surgical candidate and they are recommending PT OT with helping her gait and stability  Hypoalbuminemia: Patient's Albumin ranging from 2.3-2.4. CTM and Trend and repeat CMP w/in 1 week  Consultants: Neurosurgery Procedures performed: As delineated as above  Disposition: Skilled nursing facility Diet recommendation:  Discharge Diet Orders (From admission, onward)     Start     Ordered   03/07/24 0000  Diet - low sodium heart healthy        03/07/24 1307   03/07/24 0000  Diet Carb Modified        03/07/24 1307           Cardiac and Carb modified diet DISCHARGE MEDICATION: Allergies as of 03/07/2024       Reactions   Mobic [meloxicam] Other (See Comments)   Unknown reaction   Novocain [procaine] Other (See Comments)   Unknown reaction        Medication List     STOP taking these medications    clopidogrel 75 MG tablet Commonly known as: PLAVIX   isosorbide mononitrate 60 MG 24 hr tablet Commonly known as: IMDUR       TAKE these medications    acetaminophen 325 MG tablet Commonly known as: TYLENOL Take 650 mg by mouth every 4 (four) hours as needed. What changed: Another medication with the same name was removed. Continue taking this medication, and follow the directions  you see here.   amLODipine 5 MG tablet Commonly known as: NORVASC Take 5 mg by mouth daily.   Aspirin 81 MG Caps Take 81 mg by mouth daily.   atorvastatin 40 MG tablet Commonly known as: LIPITOR TAKE 1 TABLET BY MOUTH EVERY DAY   cefdinir 300 MG capsule Commonly known as: OMNICEF Take 1 capsule (300 mg total) by mouth 2 (two) times daily for 4 days.   cholecalciferol 25 MCG (1000 UNIT) tablet Commonly known as: VITAMIN D3 Take 1,000 Units by mouth daily.   diclofenac Sodium 1 % Gel Commonly known as: VOLTAREN Apply 2 g topically 4 (four) times daily.   estradiol 0.1 MG/GM vaginal  cream Commonly known as: ESTRACE Place 1 Applicatorful vaginally See admin instructions. Apply 1g vaginally in the evening on Monday, Wednesday, Friday.   ferrous sulfate 325 (65 FE) MG EC tablet Take 325 mg by mouth See admin instructions. Give 325mg  Three times a day by mouth with meals every Mon, Wed, Friday   HYDROcodone-acetaminophen 5-325 MG tablet Commonly known as: NORCO/VICODIN Take 0.5 tablets by mouth every 6 (six) hours as needed for moderate pain (pain score 4-6). What changed:  when to take this reasons to take this   hydrocortisone 2.5 % cream Apply 1 Application topically at bedtime.   levothyroxine 75 MCG tablet Commonly known as: SYNTHROID Take 75 mcg by mouth daily before breakfast.   lidocaine 4 % Place 1 patch onto the skin daily. Apply patch at 0800 and remove patch at at 2000   methocarbamol 500 MG tablet Commonly known as: ROBAXIN Take 1 tablet (500 mg total) by mouth every 8 (eight) hours as needed for muscle spasms.   metoprolol succinate 50 MG 24 hr tablet Commonly known as: TOPROL-XL Take 1 tablet (50 mg total) by mouth daily. Take with or immediately following a meal. Start taking on: March 08, 2024 What changed:  medication strength See the new instructions.   multivitamin with minerals Tabs tablet Take 1 tablet by mouth daily.   mupirocin ointment 2 % Commonly known as: BACTROBAN Apply 1 Application topically daily as needed (irritation).   nitroGLYCERIN 0.4 MG SL tablet Commonly known as: NITROSTAT Place 0.4 mg under the tongue every 5 (five) minutes as needed for chest pain.   polyethylene glycol 17 g packet Commonly known as: MIRALAX / GLYCOLAX Take 17 g by mouth See admin instructions. Every Mon, Wed, Friday for constipation. Mix with 4 ounces of fluid.   potassium chloride 10 MEQ tablet Commonly known as: KLOR-CON Take 10 mEq by mouth daily as needed.        Contact information for after-discharge care     Destination      [name] .   Service: Skilled Nursing Contact information: [address] [place] [address] [phone]                    Discharge Exam: Filed Weights   03/05/24 0500 03/06/24 0500 03/07/24 0607  Weight: 75.9 kg 73 kg 76.3 kg   Vitals:   03/07/24 0441 03/07/24 1218  BP: (!) 116/59 (!) 125/54  Pulse: 73 73  Resp: 18 16  Temp: 97.9 F (36.6 C) 97.7 F (36.5 C)  SpO2: 100% 100%   Examination: Physical Exam:  Constitutional: Thin elderly chronically ill-appearing Caucasian female who is hard of hearing  Respiratory: Diminished to auscultation bilaterally, no wheezing, rales, rhonchi or crackles. Normal respiratory effort and patient is not tachypenic. No  accessory muscle use. Unlabored breathing Cardiovascular: RRR, Has a 3/6 Systolic Murmur. No carotid bruits.  Abdomen: Soft, non-tender, non-distended. Bowel sounds positive.  GU: Deferred. Musculoskeletal: No clubbing / cyanosis of digits/nails. No joint deformity upper and lower extremities. Skin: Has bruising on her face and on her body Neurologic: She is extremely hard of hearing but cranial nerves II through XII grossly intact Psychiatric: Awake and alert in no acute distress  Condition at discharge: stable  The results of significant diagnostics from this hospitalization (including imaging, microbiology, ancillary and laboratory) are listed below for reference.   Imaging Studies: MR Cervical Spine W and Wo Contrast Result Date: 03/04/2024 CLINICAL DATA:  89 year old female status post fall. Age indeterminate T3 compression fracture and nonspecific epidural gas within the cervical spine by CT. EXAM: MRI CERVICAL SPINE WITHOUT AND WITH CONTRAST TECHNIQUE: Multiplanar and multiecho pulse sequences of the cervical spine, to include the craniocervical junction and cervicothoracic junction, were obtained without and with intravenous contrast. CONTRAST:  7mL GADAVIST  GADOBUTROL 1 MMOL/ML IV SOLN COMPARISON:  CT cervical spine 02/27/2024, thoracic spine 03/03/2024. FINDINGS: Intermittently motion degraded exam, especially most of the sagittal cervical imaging. Alignment: Multilevel degenerative appearing spondylolisthesis is stable, worst at C5-C6 (3-4 mm there). Vertebrae: Normal background bone marrow signal. T3 mild superior endplate compression appears chronic and No marrow edema or evidence of acute osseous abnormality. Cord: Degenerative appearing spinal cord compression at C5-C6, up to moderate (series 6, image 10). Limited spinal cord detail on this exam but no obvious spinal cord signal changes. C5 and C6 have been the epicenter of the abnormal spinal canal gas on previous CTs. See additional details below. On postcontrast axial images there is mild abnormal enhancement within the spinal canal at C5-C6, primarily in the posterior midline which appears superimposed on a small volume of gas series 14, image 32) and medial to chronically degenerated facets there (series 14, image 33) where there is rim enhancement of un intracanalicular space-occupying lesion which is 5-6 mm in thickness. Contralateral right C5-C6 hyperdense canal space-occupying mass by CT appears to be low-density on MRI best seen on series 9, image 29, nonenhancing. There is no generalized dural thickening or enhancement. And no abnormal intradural enhancement identified. Posterior Fossa, vertebral arteries, paraspinal tissues: Mild degenerative mass effect at the cervicomedullary junction. No cervicomedullary, visible brainstem or cerebellar signal abnormality or abnormal enhancement. Preserved major vascular flow voids in the neck. No cervical paraspinal soft tissue inflammation or mass identified. Negative visible right lung apex. Disc levels: Advanced cervical spine degeneration most notable for C1-C2: Bulky ligamentous hypertrophy about the odontoid which is partially calcified by CT, superimposed  on bulky C1-C2 degeneration. And there is also bulky partially calcified ligament flavum hypertrophy at that level. Combined there is C1-C2 level spinal stenosis with up to mild spinal cord mass effect (series 6 image 7). C5-C6: Spondylolisthesis of 3, up to 4 mm associated with severe bilateral facet arthropathy, bilateral posterior element hypertrophy including apparent ligamentous hypertrophy which by CT is hyperdense and possibly calcified, low signal on this exam. Abnormal mostly midline posterior epidural space gas and left side rim calcified space-occupying lesion as seen on series 14, image 33. Moderate to severe spinal stenosis and spinal cord mass effect (series 9, image 33 and series 6, image 10). Bilateral C6 foraminal stenosis is moderate to severe, worse on the right. C6-C7 and C7-T1 less pronounced spondylolisthesis. No significant spinal stenosis at those levels. Bilateral foraminal stenosis. IMPRESSION: 1. Highly abnormal cervical spine C5-C6 level. Suboptimal  MRI resolution but the constellation of CT and MRI findings favors advanced degenerative etiology - such as synovial cyst(s) and/or ligament flavum hypertrophy with partial calcification, associated vacuum phenomena - superimposed on spondylolisthesis and severe facet arthropathy. No strong evidence of spinal infection or malignancy. 2. High-grade spinal stenosis associated with #1 with cord compression there, but no spinal cord signal abnormality identified. 3. Other advanced cervical spine degeneration, most notably C1-C2 where bulky and partially calcified ligamentous hypertrophy both ventral and dorsal to the spinal cord. Spinal stenosis with less pronounced C1-C2 cord mass effect. 4. Chronic T3 superior endplate compression. No marrow edema or acute osseous abnormality identified. Electronically Signed   By: Odessa Fleming M.D.   On: 03/04/2024 04:49   CT ABDOMEN PELVIS W CONTRAST Result Date: 03/03/2024 CLINICAL DATA:  Abdominal trauma,  blunt.  Multiple falls. EXAM: CT ABDOMEN AND PELVIS WITH CONTRAST TECHNIQUE: Multidetector CT imaging of the abdomen and pelvis was performed using the standard protocol following bolus administration of intravenous contrast. RADIATION DOSE REDUCTION: This exam was performed according to the departmental dose-optimization program which includes automated exposure control, adjustment of the mA and/or kV according to patient size and/or use of iterative reconstruction technique. CONTRAST:  OMNIPAQUE IOHEXOL 300 MG/ML  SOLN COMPARISON:  09/20/2019. FINDINGS: Lower chest: The heart is enlarged and there is no pericardial effusion. Multi-vessel coronary artery calcifications are noted there is a trace left pleural effusion. Atelectasis is present at the lung bases bilaterally. Hepatobiliary: No focal liver abnormality is seen. No gallstones, gallbladder wall thickening, or biliary dilatation. Pancreas: Unremarkable. No pancreatic ductal dilatation or surrounding inflammatory changes. Spleen: Normal in size without focal abnormality. Adrenals/Urinary Tract: The adrenal glands are within normal limits. The kidneys enhance symmetrically. Renal atrophy is noted on the left. There are scattered hypodensities in the kidneys bilaterally which are too small to further characterize. A hyperdense lesion is noted in the mid left kidney measuring 1.2 cm with attenuation of 103 Hounsfield units, likely proteinaceous or hemorrhagic cyst. No renal calculus or hydronephrosis bilaterally. There is mild bladder wall thickening. Stomach/Bowel: Stomach is within normal limits. Appendix is not seen. No evidence of bowel wall thickening, distention, or inflammatory changes. No free air or pneumatosis. Scattered diverticula are present along the colon without evidence of diverticulitis. Vascular/Lymphatic: Aortic atherosclerosis. No enlarged abdominal or pelvic lymph nodes. Reproductive: Status post hysterectomy. No adnexal masses.  Other: No abdominopelvic ascites. A fat containing umbilical hernia is noted. Musculoskeletal: Scattered densities and subcutaneous fat stranding is noted over the left lateral hip, the largest measuring 4.3 x 2.6 x 4.8 cm, compatible with hematoma. Hyperdense attenuation is noted in the distal gluteal muscle on the left. Degenerative changes are present in the thoracolumbar spine and bilateral hips. Sternotomy wires are noted. IMPRESSION: 1. Subcutaneous hematoma and contusions over the left hip and likely small hematoma in the distal gluteal muscle on the left. No acute fracture is seen. 2. Trace left pleural effusion with atelectasis at the lung bases. 3. Diverticulosis without diverticulitis. 4. Aortic atherosclerosis. Electronically Signed   By: Thornell Sartorius M.D.   On: 03/03/2024 22:30   CT Thoracic Spine Wo Contrast Result Date: 03/03/2024 CLINICAL DATA:  Fall EXAM: CT THORACIC SPINE WITHOUT CONTRAST TECHNIQUE: Multidetector CT images of the thoracic were obtained using the standard protocol without intravenous contrast. RADIATION DOSE REDUCTION: This exam was performed according to the departmental dose-optimization program which includes automated exposure control, adjustment of the mA and/or kV according to patient size and/or use of iterative  reconstruction technique. COMPARISON:  None Available. FINDINGS: Alignment: Normal. Vertebrae: There is a minimally depressed wedge compression fracture of T3. No other acute osseous abnormality. Vertebral body heights are otherwise maintained. Paraspinal and other soft tissues: There is small volume pneumorachis of the lower cervical spine, similar to 02/27/2024. Disc levels: There is no spinal canal stenosis. IMPRESSION: 1. Minimally depressed wedge compression fracture of T3. 2. Small amount of epidural gas with adjacent hyperdensity within the lower cervical spine, unchanged since 02/27/2024. While this is somewhat nonspecific, epidural infection can cause this  appearance. MRI with and without contrast of the cervical spine is recommended. Electronically Signed   By: Deatra Robinson M.D.   On: 03/03/2024 22:12   DG Lumbar Spine Complete Result Date: 03/03/2024 CLINICAL DATA:  Pain after fall. EXAM: LUMBAR SPINE - COMPLETE 4+ VIEW COMPARISON:  None Available. FINDINGS: Lateral views are limited by positioning. Lower aspect of the lumbar spine is not included in the field of view. Five non-rib-bearing lumbar vertebra. Dextroscoliotic curvature. Trace retrolisthesis at tentatively L2-L3. No evidence of acute fracture or compression deformity. There is multilevel degenerative disc disease and facet hypertrophy. The sacroiliac joints are congruent. IMPRESSION: 1. Technically limited exam. Allowing for this, no acute fracture or subluxation of the lumbar spine. 2. Dextroscoliotic curvature with multilevel degenerative disc disease and facet hypertrophy. Electronically Signed   By: Narda Rutherford M.D.   On: 03/03/2024 18:42   DG Ankle Complete Left Result Date: 03/03/2024 CLINICAL DATA:  Pain after fall. EXAM: LEFT ANKLE COMPLETE - 3+ VIEW COMPARISON:  Included portion from tibia/fibular radiographs 02/27/2024 FINDINGS: There is no evidence of fracture, dislocation, or joint effusion. Ankle mortise is preserved. Mild chronic spurring of the medial and lateral malleoli. Plantar calcaneal spur and Achilles tendon enthesophyte. Vascular calcifications are seen. IMPRESSION: 1. No fracture or subluxation of the left ankle. 2. Plantar calcaneal spur and Achilles tendon enthesophyte. Electronically Signed   By: Narda Rutherford M.D.   On: 03/03/2024 18:41   CT Maxillofacial Wo Contrast Result Date: 03/03/2024 CLINICAL DATA:  Facial trauma, blunt.  Multiple falls. EXAM: CT MAXILLOFACIAL WITHOUT CONTRAST TECHNIQUE: Multidetector CT imaging of the maxillofacial structures was performed. Multiplanar CT image reconstructions were also generated. RADIATION DOSE REDUCTION: This exam was  performed according to the departmental dose-optimization program which includes automated exposure control, adjustment of the mA and/or kV according to patient size and/or use of iterative reconstruction technique. COMPARISON:  CT head without contrast 02/27/2024. FINDINGS: Osseous: No acute fractures are present. Mandible is intact and located. Prominent soft tissue pannus is present about the dens with some erosive changes consistent with rheumatoid arthritis. No acute fractures are present. Orbits: Bilateral lens replacements are noted. Globes and orbits are otherwise unremarkable. Sinuses: The paranasal sinuses and mastoid air cells are clear. Soft tissues: The left supraorbital scalp hematoma seen previously is near completely resolved. No underlying fracture is present. No new soft tissue injury is present. Limited intracranial: Within normal limits for age.  Stable. IMPRESSION: 1. No acute fracture or acute injury to the facial bones. 2. Near complete resolution of left supraorbital scalp hematoma seen previously. 3. Prominent soft tissue pannus about the dens with some erosive changes consistent with rheumatoid arthritis. Electronically Signed   By: Marin Roberts M.D.   On: 03/03/2024 17:25   DG Tibia/Fibula Left Result Date: 02/27/2024 CLINICAL DATA:  Recent fall with left leg pain, initial encounter EXAM: LEFT TIBIA AND FIBULA - 2 VIEW COMPARISON:  None Available. FINDINGS: Left knee joint replacement  is seen. Vascular calcifications are identified. No acute fracture or dislocation is noted. No soft tissue abnormality is seen. IMPRESSION: No acute abnormality noted. Electronically Signed   By: Alcide Clever M.D.   On: 02/27/2024 03:48   CT Head Wo Contrast Result Date: 02/27/2024 CLINICAL DATA:  Level 2 fall, on blood thinners, hematoma to left forehead EXAM: CT HEAD WITHOUT CONTRAST CT CERVICAL SPINE WITHOUT CONTRAST TECHNIQUE: Multidetector CT imaging of the head and cervical spine was  performed following the standard protocol without intravenous contrast. Multiplanar CT image reconstructions of the cervical spine were also generated. RADIATION DOSE REDUCTION: This exam was performed according to the departmental dose-optimization program which includes automated exposure control, adjustment of the mA and/or kV according to patient size and/or use of iterative reconstruction technique. COMPARISON:  CT head and C-spine 12/07/2023 FINDINGS: CT HEAD FINDINGS Brain: No intracranial hemorrhage, mass effect, or evidence of acute infarct. No hydrocephalus. 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 Age-commensurate cerebral atrophy and chronic small vessel ischemic disease. Vascular: No hyperdense vessel. Intracranial arterial calcification. Skull: No fracture or focal lesion.  Left forehead hematoma. Sinuses/Orbits: No acute finding. Other: None. CT CERVICAL SPINE FINDINGS Alignment: No evidence of traumatic malalignment. Chronic subluxations of multiple vertebral bodies are unchanged. Skull base and vertebrae: No acute fracture in the cervical spine. Age indeterminate superior endplate compression fracture of T3, new since 12/07/2023. There is less than 10% vertebral body height loss. No retropulsion. Sclerosis in the superior endplate of T3 is new compared to 12/07/2023. Soft tissues and spinal canal: No prevertebral fluid or swelling. No visible canal hematoma. Disc levels: Degenerative pannus formation about the atlantoaxial joint is unchanged. Multilevel spondylosis and facet arthropathy is unchanged. No severe spinal canal narrowing. Upper chest: No acute abnormality. Other: Carotid calcification. IMPRESSION: 1. No acute intracranial abnormality. Left forehead hematoma. 2. No acute fracture in the cervical spine. 3. Age indeterminate superior endplate compression fracture of T3. There is less than 10% vertebral body height loss. No retropulsion. 4.  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 Electronically Signed   By: Minerva Fester M.D.   On: 02/27/2024 02:32   CT Cervical Spine Wo Contrast Result Date: 02/27/2024 CLINICAL DATA:  Level 2 fall, on blood thinners, hematoma to left forehead EXAM: CT HEAD WITHOUT CONTRAST CT CERVICAL SPINE WITHOUT CONTRAST TECHNIQUE: Multidetector CT imaging of the head and cervical spine was performed following the standard protocol without intravenous contrast. Multiplanar CT image reconstructions of the cervical spine were also generated. RADIATION DOSE REDUCTION: This exam was performed according to the departmental dose-optimization program which includes automated exposure control, adjustment of the mA and/or kV according to patient size and/or use of iterative reconstruction technique. COMPARISON:  CT head and C-spine 12/07/2023 FINDINGS: CT HEAD FINDINGS Brain: No intracranial hemorrhage, mass effect, or evidence of acute infarct. No hydrocephalus. 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 Age-commensurate cerebral atrophy and chronic small vessel ischemic disease. Vascular: No hyperdense vessel. Intracranial arterial calcification. Skull: No fracture or focal lesion.  Left forehead hematoma. Sinuses/Orbits: No acute finding. Other: None. CT CERVICAL SPINE FINDINGS Alignment: No evidence of traumatic malalignment. Chronic subluxations of multiple vertebral bodies are unchanged. Skull base and vertebrae: No acute fracture in the cervical spine. Age indeterminate superior endplate compression fracture of T3, new since 12/07/2023. There is less than 10% vertebral body height loss. No retropulsion. Sclerosis in the superior endplate of T3 is new compared to 12/07/2023. Soft tissues  and spinal canal: No prevertebral fluid or swelling. No visible canal hematoma. Disc levels: Degenerative pannus formation about the atlantoaxial  joint is unchanged. Multilevel spondylosis and facet arthropathy is unchanged. No severe spinal canal narrowing. Upper chest: No acute abnormality. Other: Carotid calcification. IMPRESSION: 1. No acute intracranial abnormality. Left forehead hematoma. 2. No acute fracture in the cervical spine. 3. Age indeterminate superior endplate compression fracture of T3. There is less than 10% vertebral body height loss. No retropulsion. 4. 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 Electronically Signed   By: Minerva Fester M.D.   On: 02/27/2024 02:32   DG Pelvis Portable Result Date: 02/27/2024 CLINICAL DATA:  Level 2 trauma, fall, on blood thinners EXAM: PORTABLE PELVIS 1-2 VIEWS COMPARISON:  12/07/2023 FINDINGS: No acute fracture or dislocation. Degenerative changes pubic symphysis, both hips, SI joints and lower lumbar spine. IMPRESSION: No acute fracture or dislocation. Electronically Signed   By: Minerva Fester M.D.   On: 02/27/2024 02:04   DG Chest Portable 1 View Result Date: 02/27/2024 CLINICAL DATA:  Level 2 trauma, fall on blood thinners EXAM: PORTABLE CHEST 1 VIEW COMPARISON:  12/07/2023 FINDINGS: Sternotomy and CABG. Stable cardiomediastinal silhouette. Aortic atherosclerotic calcification. Elevated right hemidiaphragm and right basilar atelectasis. Additional left basilar atelectasis. Otherwise no focal consolidation. No pleural effusion or pneumothorax. No displaced rib fractures. IMPRESSION: No change from 12/07/2023. Bibasilar atelectasis. Elevated right hemidiaphragm. Electronically Signed   By: Minerva Fester M.D.   On: 02/27/2024 02:03   Microbiology: Results for orders placed or performed during the hospital encounter of 03/03/24  Urine Culture     Status: Abnormal   Collection Time: 03/03/24  2:24 PM   Specimen: Urine, Clean Catch  Result Value Ref Range Status   Specimen Description   Final    URINE, CLEAN CATCH Performed at [facility], [address], [place], [place] [address]    Special Requests   Final    NONE Performed at [facility], [address], [place], [place] [address]    Culture MULTIPLE SPECIES PRESENT, SUGGEST RECOLLECTION (A)  Final   Report Status 03/05/2024 FINAL  Final  MRSA Next Gen by PCR, Nasal     Status: Abnormal   Collection Time: 03/04/24 12:42 PM   Specimen: Nasal Mucosa; Nasal Swab  Result Value Ref Range Status   MRSA by PCR Next Gen DETECTED (A) NOT DETECTED Final    Comment: (NOTE) The GeneXpert MRSA Assay (FDA approved for NASAL specimens only), is one component of a comprehensive MRSA colonization surveillance program. It is not intended to diagnose MRSA infection nor to guide or monitor treatment for MRSA infections. Test performance is not FDA approved in patients less than 68 years old. Performed at [facility], [address] [place], [place] [address]    Labs: CBC: Recent Labs  Lab 03/03/24 1554 03/04/24 0849 03/04/24 1939 03/05/24 1817 03/06/24 0927 03/07/24 0612  WBC 9.5  --   --  7.7 6.5 5.9  NEUTROABS  --   --   --  3.9 3.6 3.0  HGB 7.3* 6.9* 8.8* 9.6* 9.2* 9.0*  HCT 24.1* 21.9* 27.6* 30.3* 29.1* 27.9*  MCV 104.8*  --   --  101.7* 99.3 100.0  PLT 232  --   --  233 253 233   Basic Metabolic Panel: Recent Labs  Lab 03/03/24 1554 03/05/24 1817 03/06/24 0927 03/07/24 0612  NA 133* 137 138 135  K 4.0 4.0 3.8  3.8  CL 104 106 109 110  CO2 20* 17* 22 21*  GLUCOSE 129* 143* 137* 101*  BUN 25* 27* 21 25*  CREATININE 1.18* 1.09* 1.27* 1.08*  CALCIUM 8.4* 8.7* 8.5* 8.0*  MG  --  1.9 1.8 1.8  PHOS  --  4.1 4.0 3.7   Liver Function Tests: Recent Labs  Lab 03/05/24 1817 03/06/24 0927 03/07/24 0612  AST 39 30 26  ALT 26 24 23   ALKPHOS 165* 165* 162*  BILITOT 0.8 0.8 0.6  PROT 6.3* 6.2* 5.7*  ALBUMIN 2.4* 2.3* 2.3*   CBG: Recent Labs  Lab 03/06/24 1743 03/06/24 2107 03/07/24 0835 03/07/24 1217  03/07/24 1258  GLUCAP 107* 114* 136* 101* 133*   Discharge time spent: greater than 30 minutes.  Signed: Marguerita Merles, DO [facility] 03/07/2024

## 2024-03-08 DIAGNOSIS — E039 Hypothyroidism, unspecified: Secondary | ICD-10-CM | POA: Diagnosis not present

## 2024-03-08 DIAGNOSIS — R609 Edema, unspecified: Secondary | ICD-10-CM | POA: Diagnosis not present

## 2024-03-08 DIAGNOSIS — R400 Somnolence: Secondary | ICD-10-CM | POA: Diagnosis not present

## 2024-03-08 DIAGNOSIS — K8689 Other specified diseases of pancreas: Secondary | ICD-10-CM | POA: Diagnosis not present

## 2024-03-08 DIAGNOSIS — Z79899 Other long term (current) drug therapy: Secondary | ICD-10-CM | POA: Diagnosis not present

## 2024-03-08 DIAGNOSIS — K5901 Slow transit constipation: Secondary | ICD-10-CM | POA: Diagnosis not present

## 2024-03-08 DIAGNOSIS — D539 Nutritional anemia, unspecified: Secondary | ICD-10-CM | POA: Diagnosis not present

## 2024-03-08 DIAGNOSIS — N3001 Acute cystitis with hematuria: Secondary | ICD-10-CM | POA: Diagnosis not present

## 2024-03-08 DIAGNOSIS — Z7401 Bed confinement status: Secondary | ICD-10-CM | POA: Diagnosis not present

## 2024-03-08 DIAGNOSIS — L570 Actinic keratosis: Secondary | ICD-10-CM | POA: Diagnosis not present

## 2024-03-08 DIAGNOSIS — D62 Acute posthemorrhagic anemia: Secondary | ICD-10-CM | POA: Diagnosis not present

## 2024-03-08 DIAGNOSIS — Z951 Presence of aortocoronary bypass graft: Secondary | ICD-10-CM | POA: Diagnosis not present

## 2024-03-08 DIAGNOSIS — F32A Depression, unspecified: Secondary | ICD-10-CM | POA: Diagnosis not present

## 2024-03-08 DIAGNOSIS — S50811A Abrasion of right forearm, initial encounter: Secondary | ICD-10-CM | POA: Diagnosis not present

## 2024-03-08 DIAGNOSIS — L814 Other melanin hyperpigmentation: Secondary | ICD-10-CM | POA: Diagnosis not present

## 2024-03-08 DIAGNOSIS — Z7982 Long term (current) use of aspirin: Secondary | ICD-10-CM | POA: Diagnosis not present

## 2024-03-08 DIAGNOSIS — R109 Unspecified abdominal pain: Secondary | ICD-10-CM | POA: Diagnosis not present

## 2024-03-08 DIAGNOSIS — L821 Other seborrheic keratosis: Secondary | ICD-10-CM | POA: Diagnosis not present

## 2024-03-08 DIAGNOSIS — K5730 Diverticulosis of large intestine without perforation or abscess without bleeding: Secondary | ICD-10-CM | POA: Diagnosis not present

## 2024-03-08 DIAGNOSIS — E1165 Type 2 diabetes mellitus with hyperglycemia: Secondary | ICD-10-CM | POA: Diagnosis not present

## 2024-03-08 DIAGNOSIS — E876 Hypokalemia: Secondary | ICD-10-CM | POA: Diagnosis not present

## 2024-03-08 DIAGNOSIS — K828 Other specified diseases of gallbladder: Secondary | ICD-10-CM | POA: Diagnosis not present

## 2024-03-08 DIAGNOSIS — N390 Urinary tract infection, site not specified: Secondary | ICD-10-CM | POA: Diagnosis not present

## 2024-03-08 DIAGNOSIS — E119 Type 2 diabetes mellitus without complications: Secondary | ICD-10-CM | POA: Diagnosis not present

## 2024-03-08 DIAGNOSIS — S22030D Wedge compression fracture of third thoracic vertebra, subsequent encounter for fracture with routine healing: Secondary | ICD-10-CM | POA: Diagnosis not present

## 2024-03-08 DIAGNOSIS — R350 Frequency of micturition: Secondary | ICD-10-CM | POA: Diagnosis not present

## 2024-03-08 DIAGNOSIS — I5032 Chronic diastolic (congestive) heart failure: Secondary | ICD-10-CM | POA: Diagnosis not present

## 2024-03-08 DIAGNOSIS — R58 Hemorrhage, not elsewhere classified: Secondary | ICD-10-CM | POA: Diagnosis not present

## 2024-03-08 DIAGNOSIS — M47812 Spondylosis without myelopathy or radiculopathy, cervical region: Secondary | ICD-10-CM | POA: Diagnosis not present

## 2024-03-08 DIAGNOSIS — E559 Vitamin D deficiency, unspecified: Secondary | ICD-10-CM | POA: Diagnosis not present

## 2024-03-08 DIAGNOSIS — D649 Anemia, unspecified: Secondary | ICD-10-CM | POA: Diagnosis not present

## 2024-03-08 DIAGNOSIS — Z743 Need for continuous supervision: Secondary | ICD-10-CM | POA: Diagnosis not present

## 2024-03-08 DIAGNOSIS — I959 Hypotension, unspecified: Secondary | ICD-10-CM | POA: Diagnosis not present

## 2024-03-08 DIAGNOSIS — B9689 Other specified bacterial agents as the cause of diseases classified elsewhere: Secondary | ICD-10-CM | POA: Diagnosis not present

## 2024-03-08 DIAGNOSIS — I2510 Atherosclerotic heart disease of native coronary artery without angina pectoris: Secondary | ICD-10-CM | POA: Diagnosis not present

## 2024-03-08 DIAGNOSIS — N760 Acute vaginitis: Secondary | ICD-10-CM | POA: Diagnosis not present

## 2024-03-08 DIAGNOSIS — N952 Postmenopausal atrophic vaginitis: Secondary | ICD-10-CM | POA: Diagnosis not present

## 2024-03-08 DIAGNOSIS — R748 Abnormal levels of other serum enzymes: Secondary | ICD-10-CM | POA: Diagnosis not present

## 2024-03-08 DIAGNOSIS — M1990 Unspecified osteoarthritis, unspecified site: Secondary | ICD-10-CM | POA: Diagnosis not present

## 2024-03-08 DIAGNOSIS — E871 Hypo-osmolality and hyponatremia: Secondary | ICD-10-CM | POA: Diagnosis not present

## 2024-03-08 DIAGNOSIS — K625 Hemorrhage of anus and rectum: Secondary | ICD-10-CM | POA: Diagnosis present

## 2024-03-08 DIAGNOSIS — E785 Hyperlipidemia, unspecified: Secondary | ICD-10-CM | POA: Diagnosis not present

## 2024-03-08 DIAGNOSIS — I10 Essential (primary) hypertension: Secondary | ICD-10-CM | POA: Diagnosis not present

## 2024-03-08 DIAGNOSIS — K921 Melena: Secondary | ICD-10-CM | POA: Diagnosis not present

## 2024-03-08 DIAGNOSIS — M6281 Muscle weakness (generalized): Secondary | ICD-10-CM | POA: Diagnosis not present

## 2024-03-08 DIAGNOSIS — R531 Weakness: Secondary | ICD-10-CM | POA: Diagnosis not present

## 2024-03-08 DIAGNOSIS — M5459 Other low back pain: Secondary | ICD-10-CM | POA: Diagnosis not present

## 2024-03-08 DIAGNOSIS — I350 Nonrheumatic aortic (valve) stenosis: Secondary | ICD-10-CM | POA: Diagnosis not present

## 2024-03-08 DIAGNOSIS — Z66 Do not resuscitate: Secondary | ICD-10-CM | POA: Diagnosis not present

## 2024-03-08 DIAGNOSIS — E782 Mixed hyperlipidemia: Secondary | ICD-10-CM | POA: Diagnosis not present

## 2024-03-08 DIAGNOSIS — M545 Low back pain, unspecified: Secondary | ICD-10-CM | POA: Diagnosis not present

## 2024-03-08 DIAGNOSIS — G8929 Other chronic pain: Secondary | ICD-10-CM | POA: Diagnosis not present

## 2024-03-08 DIAGNOSIS — R319 Hematuria, unspecified: Secondary | ICD-10-CM | POA: Diagnosis not present

## 2024-03-08 LAB — GLUCOSE, CAPILLARY: Glucose-Capillary: 128 mg/dL — ABNORMAL HIGH (ref 70–99)

## 2024-03-08 NOTE — Plan of Care (Signed)
  Problem: Education: Goal: Ability to describe self-care measures that may prevent or decrease complications (Diabetes Survival Skills Education) will improve Outcome: Adequate for Discharge Goal: Individualized Educational Video(s) Outcome: Adequate for Discharge   Problem: Coping: Goal: Ability to adjust to condition or change in health will improve Outcome: Adequate for Discharge   Problem: Fluid Volume: Goal: Ability to maintain a balanced intake and output will improve Outcome: Adequate for Discharge   Problem: Health Behavior/Discharge Planning: Goal: Ability to identify and utilize available resources and services will improve Outcome: Adequate for Discharge Goal: Ability to manage health-related needs will improve Outcome: Adequate for Discharge   Problem: Metabolic: Goal: Ability to maintain appropriate glucose levels will improve Outcome: Adequate for Discharge

## 2024-03-08 NOTE — TOC Transition Note (Signed)
 Transition of Care [facility]) - Discharge Note   Patient Details  Name: Kathryn Carlson MRN: [id number] Date of Birth: 1927/08/12  Transition of [facility]) CM/SW Contact:  Wai Minotti A [place], LCSW Phone Number: 03/08/2024, 3:59 PM   Clinical Narrative:     Patient will DC to: [name]  Anticipated DC date:  Family notified: Dois Davenport, pt's daughter  Transport by: Sharin Mons      Per MD patient ready for DC to [name]. RN, patient, patient's family, and facility notified of DC. Discharge Summary and FL2 sent to facility. RN to call report prior to discharge [phone], room 30). DC packet on chart. Ambulance transport requested for patient.     CSW will sign off for now as social work intervention is no longer needed. Please consult [place] again if new needs arise.    Final next level of care: Skilled Nursing Facility Barriers to Discharge: Barriers Resolved   Patient Goals and CMS Choice Patient states their goals for this hospitalization and ongoing recovery are:: Pt's daughter states, Rehab          Discharge Placement              Patient chooses bed at: [name] Patient to be transferred to facility by: [facility] Name of family member notified: Dois Davenport, pt's daughter Patient and family notified of of transfer: 03/08/24  Discharge Plan and Services Additional resources added to the After Visit Summary for                                       Social Drivers of Health (SDOH) Interventions SDOH Screenings   Food Insecurity: No Food Insecurity (03/04/2024)  Housing: Unknown (03/04/2024)  Transportation Needs: No Transportation Needs (03/04/2024)  Utilities: Not At Risk (03/04/2024)  Depression (PHQ2-9): Low Risk  (01/25/2024)  Social Connections: Socially Isolated (03/04/2024)  Tobacco Use: Low Risk  (03/03/2024)     Readmission Risk Interventions     No data to display

## 2024-03-08 NOTE — Discharge Summary (Signed)
 Physician Discharge Summary   Patient: Kathryn Carlson MRN: [id number] DOB: 23-May-1927  Admit date:     03/03/2024  Discharge date: 03/07/24  Discharge Physician: Marguerita Merles, DO   PCP: Mast, Man X, NP   Patient seen and examined and looks stable for discharge from my perspective-I have reviewed briefly the note and the medications at discharge  Recommendations at discharge:   Follow-up with PCP within 1 to 2 weeks and repeat CBC, CMP, mag, Phos within 1 week Follow-up with Neurosurgery in outpatient setting if necessary Follow-up Thyroid Function studies in 4 to 6 weeks Be referred to Hematology in case hemoglobin continue to drop in outpatient setting Should avoid Plavix in the outpatient setting given high risk of falls and high risk of life threatening bleeding   DIscharge Diagnoses: Principal Problem:   Generalized weakness Active Problems:   HTN (hypertension)   Dyslipidemia   Aortic stenosis, mild   Hypothyroidism   Type 2 diabetes, diet controlled (HCC)   Acute on chronic blood loss anemia   Hypocalcemia   Hyponatremia   Macrocytic anemia   Acute UTI (urinary tract infection)   Gluteal hematoma   CAD (coronary artery disease)  Resolved Problems:   * No resolved hospital problems. [facility] Course: The patient is a 89 year old Caucasian female with a past medical history significant for basal cell carcinoma, CAD status post CABG, history of MI, hypertension, GERD, hypothyroidism, was brought to the ED given lower extremity weakness with recurrent multiple falls.  She has been following and developed a hematoma, the left periorbital area after a fall in her facility.  She has been having trouble weightbearing on her left lower extremity due to the pain.  Further workup was done and shows a UTI and fecal occult blood negative.  She has a slight minimally displaced wedge fracture of T3 but no pain.  PT OT recommending SNF now. Blood count appears stable she is stable to  discharge to SNF and will need continued therapy and follow-up with PCP and and possibly hematology follow-up for blood count if continues to be on lower side in outpatient setting.  Assessment and Plan:  Generalized weakness and Recurrent Falls In the setting of  Acute UTI (urinary tract infection): Admit to telemetry/inpatient.  Urinalysis done showed a cloudy appearance with amber color urine, small hemoglobin, moderate leukocytes, negative nitrites, few bacteria, greater than 50 WBCs with urine culture showing multiple species present so we will recollect but never done. C/w ceftriaxone 1 g IVPB daily changed to p.o. cefdinir for completion of course. Follow urine culture and sensitivity however shows multiple species.  Continue monitor and follow-up in outpatient setting within 1 to 2 weeks Hyponatremia: Improved;Minimally decreased at 133 on admission and now 138 -> 135. CTM and Trend and repeat CMP w/in 1 week   Gluteal hematoma Superimposed on Macrocytic anemia Leading to Acute on chronic blood loss Anemia -Anemia Panel Showed an iron level of 30, UIBC 200, TIBC 230, saturation ratio 13%, ferritin of 102 -Negative occult blood in stool. Transfuse 1 unit of PRBC. Hgb/Hct dropped to 6.9/21.9 and now relatively stable (the last 4 checks where Hb is ~ 9. -No longer taking Clopidogrel. Will CTM for S/Sx of Bleeding; No overt bleeding noted. Repeat CBC within 1 week and if necessary refer to hematology.   Essential HTN (hypertension): Soft BP readings initially..Hold Amlodipine and resume BB at half the dose as below. CTM BP per Protocol; Last BP reading was 125/54   CAD (  coronary artery disease): Status post CABG. Continue statin. Resume beta-blocker but at half the dose at 50 mg of Metoprolol Suxxinate. Continue to hold CCB and consider resumption at discharge and monitor for any chest pain   Dyslipidemia / Aortic Stenosis, mild: Continue Atorvastatin 40 mg p.o. daily.   Hypothyroidism: TSH  was 6.214; Continue Levothyroxine 75 mcg p.o. daily and may need to be increased in outpatient setting when repeat thyroid function studies in 4 to 6 weeks.   Type 2 Diabetes Mellitus, Diet controlled (HCC): Carbohydrate modified diet. CBG monitoring with Sensitive Novolog SSI. Check HbA1c. CBGs ranging from 101-136 on last 5 checks  CKD Stage 3b / Metabolic Acidosis: Stable as BUN/Cr now 25/1.08 today. Has a slight metabolic acidosis with a CO2 of 21, anion gap of 4, chloride level 110 -Avoid Nephrotoxic Medications, Contrast Dyes, Hypotension and Dehydration to Ensure Adequate Renal Perfusion and will need to Renally Adjust Meds. CTM and Trend Renal Function carefully and repeat CMP in the AM    Hypocalcemia: Recheck calcium with Albumin level in outpatient setting as corrected calcium was 9.4  T3 Compression Fx:  Minimally depressed wedge compression fracture of T3 noted on CT Scan; Not having Sx but will need Pain control if necessary   Cervical Spine Abnormality: CT showed "Small amount of epidural gas with adjacent hyperdensity within the lower cervical spine, unchanged since 02/27/2024. While this is somewhat nonspecific, epidural infection can cause this appearance." -MRI Cervical Spine done and showed "Highly abnormal cervical spine C5-C6 level. Suboptimal MRI resolution but the constellation of CT and MRI findings favors advanced degenerative etiology - such as synovial cyst(s) and/or ligament flavum hypertrophy with partial calcification, associated vacuum phenomena - superimposed on spondylolisthesis and severe facet arthropathy. No strong evidence of spinal infection or malignancy.. High-grade spinal stenosis associated with #1 with cord compression there, but no spinal cord signal abnormality identified. Other advanced cervical spine degeneration, most notably C1-C2 where bulky and partially calcified ligamentous hypertrophy both ventral and dorsal to the spinal cord. Spinal stenosis with  less pronounced C1-C2 cord mass effect.. Chronic T3 superior endplate compression. No marrow edema or acute osseous abnormality identified." -Neurosurgery evaluated and they feel that her severe spinal cord compression at C5-C6 with multi level spondylosis could be contributing her frequent falls but given her age and medical history she is not a surgical candidate and they are recommending PT OT with helping her gait and stability  Hypoalbuminemia: Patient's Albumin ranging from 2.3-2.4. CTM and Trend and repeat CMP w/in 1 week  Consultants: Neurosurgery Procedures performed: As delineated as above  Disposition: Skilled nursing facility Diet recommendation:  Discharge Diet Orders (From admission, onward)     Start     Ordered   03/07/24 0000  Diet - low sodium heart healthy        03/07/24 1307   03/07/24 0000  Diet Carb Modified        03/07/24 1307           Cardiac and Carb modified diet DISCHARGE MEDICATION: Allergies as of 03/08/2024       Reactions   Mobic [meloxicam] Other (See Comments)   Unknown reaction   Novocain [procaine] Other (See Comments)   Unknown reaction        Medication List     STOP taking these medications    clopidogrel 75 MG tablet Commonly known as: PLAVIX   isosorbide mononitrate 60 MG 24 hr tablet Commonly known as: IMDUR  TAKE these medications    acetaminophen 325 MG tablet Commonly known as: TYLENOL Take 650 mg by mouth every 4 (four) hours as needed. What changed: Another medication with the same name was removed. Continue taking this medication, and follow the directions you see here.   amLODipine 5 MG tablet Commonly known as: NORVASC Take 5 mg by mouth daily.   Aspirin 81 MG Caps Take 81 mg by mouth daily.   atorvastatin 40 MG tablet Commonly known as: LIPITOR TAKE 1 TABLET BY MOUTH EVERY DAY   cefdinir 300 MG capsule Commonly known as: OMNICEF Take 1 capsule (300 mg total) by mouth 2 (two) times daily for 4  days.   cholecalciferol 25 MCG (1000 UNIT) tablet Commonly known as: VITAMIN D3 Take 1,000 Units by mouth daily.   diclofenac Sodium 1 % Gel Commonly known as: VOLTAREN Apply 2 g topically 4 (four) times daily.   estradiol 0.1 MG/GM vaginal cream Commonly known as: ESTRACE Place 1 Applicatorful vaginally See admin instructions. Apply 1g vaginally in the evening on Monday, Wednesday, Friday.   ferrous sulfate 325 (65 FE) MG EC tablet Take 325 mg by mouth See admin instructions. Give 325mg  Three times a day by mouth with meals every Mon, Wed, Friday   HYDROcodone-acetaminophen 5-325 MG tablet Commonly known as: NORCO/VICODIN Take 0.5 tablets by mouth every 6 (six) hours as needed for moderate pain (pain score 4-6). What changed:  when to take this reasons to take this   hydrocortisone 2.5 % cream Apply 1 Application topically at bedtime.   levothyroxine 75 MCG tablet Commonly known as: SYNTHROID Take 75 mcg by mouth daily before breakfast.   lidocaine 4 % Place 1 patch onto the skin daily. Apply patch at 0800 and remove patch at at 2000   methocarbamol 500 MG tablet Commonly known as: ROBAXIN Take 1 tablet (500 mg total) by mouth every 8 (eight) hours as needed for muscle spasms.   metoprolol succinate 50 MG 24 hr tablet Commonly known as: TOPROL-XL Take 1 tablet (50 mg total) by mouth daily. Take with or immediately following a meal. What changed:  medication strength See the new instructions.   multivitamin with minerals Tabs tablet Take 1 tablet by mouth daily.   mupirocin ointment 2 % Commonly known as: BACTROBAN Apply 1 Application topically daily as needed (irritation).   nitroGLYCERIN 0.4 MG SL tablet Commonly known as: NITROSTAT Place 0.4 mg under the tongue every 5 (five) minutes as needed for chest pain.   polyethylene glycol 17 g packet Commonly known as: MIRALAX / GLYCOLAX Take 17 g by mouth See admin instructions. Every Mon, Wed, Friday for  constipation. Mix with 4 ounces of fluid.   potassium chloride 10 MEQ tablet Commonly known as: KLOR-CON Take 10 mEq by mouth daily as needed.        Contact information for after-discharge care     Destination     [name] .   Service: Skilled Nursing Contact information: [address] [place] [address] [phone]                    Discharge Exam: Filed Weights   03/06/24 0500 03/07/24 0607 03/08/24 0425  Weight: 73 kg 76.3 kg 74 kg   Vitals:   03/08/24 0425 03/08/24 0828  BP: (!) 142/63 (!) 110/43  Pulse: 77 76  Resp: 16 17  Temp: 97.6 F (36.4 C) 98.8 F (37.1 C)  SpO2: 97% 97%  Examination: Physical Exam:  Constitutional: Thin elderly chronically ill-appearing Caucasian female who is hard of hearing  Respiratory: Diminished to auscultation bilaterally, no wheezing, rales, rhonchi or crackles. Normal respiratory effort and patient is not tachypenic. No accessory muscle use. Unlabored breathing Cardiovascular: RRR, Has a 3/6 Systolic Murmur. No carotid bruits.  Abdomen: Soft, non-tender, non-distended. Bowel sounds positive.  GU: Deferred. Musculoskeletal: No clubbing / cyanosis of digits/nails. No joint deformity upper and lower extremities. Skin: Has bruising on her face and on her body Neurologic: She is extremely hard of hearing but cranial nerves II through XII grossly intact Psychiatric: Awake and alert in no acute distress  Condition at discharge: stable  The results of significant diagnostics from this hospitalization (including imaging, microbiology, ancillary and laboratory) are listed below for reference.   Imaging Studies: MR Cervical Spine W and Wo Contrast Result Date: 03/04/2024 CLINICAL DATA:  89 year old female status post fall. Age indeterminate T3 compression fracture and nonspecific epidural gas within the cervical spine by CT. EXAM: MRI CERVICAL SPINE WITHOUT AND WITH CONTRAST  TECHNIQUE: Multiplanar and multiecho pulse sequences of the cervical spine, to include the craniocervical junction and cervicothoracic junction, were obtained without and with intravenous contrast. CONTRAST:  7mL GADAVIST GADOBUTROL 1 MMOL/ML IV SOLN COMPARISON:  CT cervical spine 02/27/2024, thoracic spine 03/03/2024. FINDINGS: Intermittently motion degraded exam, especially most of the sagittal cervical imaging. Alignment: Multilevel degenerative appearing spondylolisthesis is stable, worst at C5-C6 (3-4 mm there). Vertebrae: Normal background bone marrow signal. T3 mild superior endplate compression appears chronic and No marrow edema or evidence of acute osseous abnormality. Cord: Degenerative appearing spinal cord compression at C5-C6, up to moderate (series 6, image 10). Limited spinal cord detail on this exam but no obvious spinal cord signal changes. C5 and C6 have been the epicenter of the abnormal spinal canal gas on previous CTs. See additional details below. On postcontrast axial images there is mild abnormal enhancement within the spinal canal at C5-C6, primarily in the posterior midline which appears superimposed on a small volume of gas series 14, image 32) and medial to chronically degenerated facets there (series 14, image 33) where there is rim enhancement of un intracanalicular space-occupying lesion which is 5-6 mm in thickness. Contralateral right C5-C6 hyperdense canal space-occupying mass by CT appears to be low-density on MRI best seen on series 9, image 29, nonenhancing. There is no generalized dural thickening or enhancement. And no abnormal intradural enhancement identified. Posterior Fossa, vertebral arteries, paraspinal tissues: Mild degenerative mass effect at the cervicomedullary junction. No cervicomedullary, visible brainstem or cerebellar signal abnormality or abnormal enhancement. Preserved major vascular flow voids in the neck. No cervical paraspinal soft tissue inflammation or  mass identified. Negative visible right lung apex. Disc levels: Advanced cervical spine degeneration most notable for C1-C2: Bulky ligamentous hypertrophy about the odontoid which is partially calcified by CT, superimposed on bulky C1-C2 degeneration. And there is also bulky partially calcified ligament flavum hypertrophy at that level. Combined there is C1-C2 level spinal stenosis with up to mild spinal cord mass effect (series 6 image 7). C5-C6: Spondylolisthesis of 3, up to 4 mm associated with severe bilateral facet arthropathy, bilateral posterior element hypertrophy including apparent ligamentous hypertrophy which by CT is hyperdense and possibly calcified, low signal on this exam. Abnormal mostly midline posterior epidural space gas and left side rim calcified space-occupying lesion as seen on series 14, image 33. Moderate to severe spinal stenosis and spinal cord mass effect (series 9, image 33 and series 6, image 10).  Bilateral C6 foraminal stenosis is moderate to severe, worse on the right. C6-C7 and C7-T1 less pronounced spondylolisthesis. No significant spinal stenosis at those levels. Bilateral foraminal stenosis. IMPRESSION: 1. Highly abnormal cervical spine C5-C6 level. Suboptimal MRI resolution but the constellation of CT and MRI findings favors advanced degenerative etiology - such as synovial cyst(s) and/or ligament flavum hypertrophy with partial calcification, associated vacuum phenomena - superimposed on spondylolisthesis and severe facet arthropathy. No strong evidence of spinal infection or malignancy. 2. High-grade spinal stenosis associated with #1 with cord compression there, but no spinal cord signal abnormality identified. 3. Other advanced cervical spine degeneration, most notably C1-C2 where bulky and partially calcified ligamentous hypertrophy both ventral and dorsal to the spinal cord. Spinal stenosis with less pronounced C1-C2 cord mass effect. 4. Chronic T3 superior endplate  compression. No marrow edema or acute osseous abnormality identified. Electronically Signed   By: Odessa Fleming M.D.   On: 03/04/2024 04:49   CT ABDOMEN PELVIS W CONTRAST Result Date: 03/03/2024 CLINICAL DATA:  Abdominal trauma, blunt.  Multiple falls. EXAM: CT ABDOMEN AND PELVIS WITH CONTRAST TECHNIQUE: Multidetector CT imaging of the abdomen and pelvis was performed using the standard protocol following bolus administration of intravenous contrast. RADIATION DOSE REDUCTION: This exam was performed according to the departmental dose-optimization program which includes automated exposure control, adjustment of the mA and/or kV according to patient size and/or use of iterative reconstruction technique. CONTRAST:  OMNIPAQUE IOHEXOL 300 MG/ML  SOLN COMPARISON:  09/20/2019. FINDINGS: Lower chest: The heart is enlarged and there is no pericardial effusion. Multi-vessel coronary artery calcifications are noted there is a trace left pleural effusion. Atelectasis is present at the lung bases bilaterally. Hepatobiliary: No focal liver abnormality is seen. No gallstones, gallbladder wall thickening, or biliary dilatation. Pancreas: Unremarkable. No pancreatic ductal dilatation or surrounding inflammatory changes. Spleen: Normal in size without focal abnormality. Adrenals/Urinary Tract: The adrenal glands are within normal limits. The kidneys enhance symmetrically. Renal atrophy is noted on the left. There are scattered hypodensities in the kidneys bilaterally which are too small to further characterize. A hyperdense lesion is noted in the mid left kidney measuring 1.2 cm with attenuation of 103 Hounsfield units, likely proteinaceous or hemorrhagic cyst. No renal calculus or hydronephrosis bilaterally. There is mild bladder wall thickening. Stomach/Bowel: Stomach is within normal limits. Appendix is not seen. No evidence of bowel wall thickening, distention, or inflammatory changes. No free air or pneumatosis. Scattered  diverticula are present along the colon without evidence of diverticulitis. Vascular/Lymphatic: Aortic atherosclerosis. No enlarged abdominal or pelvic lymph nodes. Reproductive: Status post hysterectomy. No adnexal masses. Other: No abdominopelvic ascites. A fat containing umbilical hernia is noted. Musculoskeletal: Scattered densities and subcutaneous fat stranding is noted over the left lateral hip, the largest measuring 4.3 x 2.6 x 4.8 cm, compatible with hematoma. Hyperdense attenuation is noted in the distal gluteal muscle on the left. Degenerative changes are present in the thoracolumbar spine and bilateral hips. Sternotomy wires are noted. IMPRESSION: 1. Subcutaneous hematoma and contusions over the left hip and likely small hematoma in the distal gluteal muscle on the left. No acute fracture is seen. 2. Trace left pleural effusion with atelectasis at the lung bases. 3. Diverticulosis without diverticulitis. 4. Aortic atherosclerosis. Electronically Signed   By: Thornell Sartorius M.D.   On: 03/03/2024 22:30   CT Thoracic Spine Wo Contrast Result Date: 03/03/2024 CLINICAL DATA:  Fall EXAM: CT THORACIC SPINE WITHOUT CONTRAST TECHNIQUE: Multidetector CT images of the thoracic were obtained using the  standard protocol without intravenous contrast. RADIATION DOSE REDUCTION: This exam was performed according to the departmental dose-optimization program which includes automated exposure control, adjustment of the mA and/or kV according to patient size and/or use of iterative reconstruction technique. COMPARISON:  None Available. FINDINGS: Alignment: Normal. Vertebrae: There is a minimally depressed wedge compression fracture of T3. No other acute osseous abnormality. Vertebral body heights are otherwise maintained. Paraspinal and other soft tissues: There is small volume pneumorachis of the lower cervical spine, similar to 02/27/2024. Disc levels: There is no spinal canal stenosis. IMPRESSION: 1. Minimally depressed  wedge compression fracture of T3. 2. Small amount of epidural gas with adjacent hyperdensity within the lower cervical spine, unchanged since 02/27/2024. While this is somewhat nonspecific, epidural infection can cause this appearance. MRI with and without contrast of the cervical spine is recommended. Electronically Signed   By: Deatra Robinson M.D.   On: 03/03/2024 22:12   DG Lumbar Spine Complete Result Date: 03/03/2024 CLINICAL DATA:  Pain after fall. EXAM: LUMBAR SPINE - COMPLETE 4+ VIEW COMPARISON:  None Available. FINDINGS: Lateral views are limited by positioning. Lower aspect of the lumbar spine is not included in the field of view. Five non-rib-bearing lumbar vertebra. Dextroscoliotic curvature. Trace retrolisthesis at tentatively L2-L3. No evidence of acute fracture or compression deformity. There is multilevel degenerative disc disease and facet hypertrophy. The sacroiliac joints are congruent. IMPRESSION: 1. Technically limited exam. Allowing for this, no acute fracture or subluxation of the lumbar spine. 2. Dextroscoliotic curvature with multilevel degenerative disc disease and facet hypertrophy. Electronically Signed   By: Narda Rutherford M.D.   On: 03/03/2024 18:42   DG Ankle Complete Left Result Date: 03/03/2024 CLINICAL DATA:  Pain after fall. EXAM: LEFT ANKLE COMPLETE - 3+ VIEW COMPARISON:  Included portion from tibia/fibular radiographs 02/27/2024 FINDINGS: There is no evidence of fracture, dislocation, or joint effusion. Ankle mortise is preserved. Mild chronic spurring of the medial and lateral malleoli. Plantar calcaneal spur and Achilles tendon enthesophyte. Vascular calcifications are seen. IMPRESSION: 1. No fracture or subluxation of the left ankle. 2. Plantar calcaneal spur and Achilles tendon enthesophyte. Electronically Signed   By: Narda Rutherford M.D.   On: 03/03/2024 18:41   CT Maxillofacial Wo Contrast Result Date: 03/03/2024 CLINICAL DATA:  Facial trauma, blunt.  Multiple  falls. EXAM: CT MAXILLOFACIAL WITHOUT CONTRAST TECHNIQUE: Multidetector CT imaging of the maxillofacial structures was performed. Multiplanar CT image reconstructions were also generated. RADIATION DOSE REDUCTION: This exam was performed according to the departmental dose-optimization program which includes automated exposure control, adjustment of the mA and/or kV according to patient size and/or use of iterative reconstruction technique. COMPARISON:  CT head without contrast 02/27/2024. FINDINGS: Osseous: No acute fractures are present. Mandible is intact and located. Prominent soft tissue pannus is present about the dens with some erosive changes consistent with rheumatoid arthritis. No acute fractures are present. Orbits: Bilateral lens replacements are noted. Globes and orbits are otherwise unremarkable. Sinuses: The paranasal sinuses and mastoid air cells are clear. Soft tissues: The left supraorbital scalp hematoma seen previously is near completely resolved. No underlying fracture is present. No new soft tissue injury is present. Limited intracranial: Within normal limits for age.  Stable. IMPRESSION: 1. No acute fracture or acute injury to the facial bones. 2. Near complete resolution of left supraorbital scalp hematoma seen previously. 3. Prominent soft tissue pannus about the dens with some erosive changes consistent with rheumatoid arthritis. Electronically Signed   By: Marin Roberts M.D.   On: 03/03/2024  17:25   DG Tibia/Fibula Left Result Date: 02/27/2024 CLINICAL DATA:  Recent fall with left leg pain, initial encounter EXAM: LEFT TIBIA AND FIBULA - 2 VIEW COMPARISON:  None Available. FINDINGS: Left knee joint replacement is seen. Vascular calcifications are identified. No acute fracture or dislocation is noted. No soft tissue abnormality is seen. IMPRESSION: No acute abnormality noted. Electronically Signed   By: Alcide Clever M.D.   On: 02/27/2024 03:48   CT Head Wo Contrast Result Date:  02/27/2024 CLINICAL DATA:  Level 2 fall, on blood thinners, hematoma to left forehead EXAM: CT HEAD WITHOUT CONTRAST CT CERVICAL SPINE WITHOUT CONTRAST TECHNIQUE: Multidetector CT imaging of the head and cervical spine was performed following the standard protocol without intravenous contrast. Multiplanar CT image reconstructions of the cervical spine were also generated. RADIATION DOSE REDUCTION: This exam was performed according to the departmental dose-optimization program which includes automated exposure control, adjustment of the mA and/or kV according to patient size and/or use of iterative reconstruction technique. COMPARISON:  CT head and C-spine 12/07/2023 FINDINGS: CT HEAD FINDINGS Brain: No intracranial hemorrhage, mass effect, or evidence of acute infarct. No hydrocephalus. 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 Age-commensurate cerebral atrophy and chronic small vessel ischemic disease. Vascular: No hyperdense vessel. Intracranial arterial calcification. Skull: No fracture or focal lesion.  Left forehead hematoma. Sinuses/Orbits: No acute finding. Other: None. CT CERVICAL SPINE FINDINGS Alignment: No evidence of traumatic malalignment. Chronic subluxations of multiple vertebral bodies are unchanged. Skull base and vertebrae: No acute fracture in the cervical spine. Age indeterminate superior endplate compression fracture of T3, new since 12/07/2023. There is less than 10% vertebral body height loss. No retropulsion. Sclerosis in the superior endplate of T3 is new compared to 12/07/2023. Soft tissues and spinal canal: No prevertebral fluid or swelling. No visible canal hematoma. Disc levels: Degenerative pannus formation about the atlantoaxial joint is unchanged. Multilevel spondylosis and facet arthropathy is unchanged. No severe spinal canal narrowing. Upper chest: No acute abnormality. Other: Carotid calcification. IMPRESSION: 1. No acute  intracranial abnormality. Left forehead hematoma. 2. No acute fracture in the cervical spine. 3. Age indeterminate superior endplate compression fracture of T3. There is less than 10% vertebral body height loss. No retropulsion. 4. 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 Electronically Signed   By: Minerva Fester M.D.   On: 02/27/2024 02:32   CT Cervical Spine Wo Contrast Result Date: 02/27/2024 CLINICAL DATA:  Level 2 fall, on blood thinners, hematoma to left forehead EXAM: CT HEAD WITHOUT CONTRAST CT CERVICAL SPINE WITHOUT CONTRAST TECHNIQUE: Multidetector CT imaging of the head and cervical spine was performed following the standard protocol without intravenous contrast. Multiplanar CT image reconstructions of the cervical spine were also generated. RADIATION DOSE REDUCTION: This exam was performed according to the departmental dose-optimization program which includes automated exposure control, adjustment of the mA and/or kV according to patient size and/or use of iterative reconstruction technique. COMPARISON:  CT head and C-spine 12/07/2023 FINDINGS: CT HEAD FINDINGS Brain: No intracranial hemorrhage, mass effect, or evidence of acute infarct. No hydrocephalus. 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 Age-commensurate cerebral atrophy and chronic small vessel ischemic disease. Vascular: No hyperdense vessel. Intracranial arterial calcification. Skull: No fracture or focal lesion.  Left forehead hematoma. Sinuses/Orbits: No acute finding. Other: None. CT CERVICAL SPINE FINDINGS Alignment: No evidence of traumatic malalignment. Chronic subluxations of multiple vertebral bodies are unchanged. Skull base and vertebrae: No acute fracture in the cervical  spine. Age indeterminate superior endplate compression fracture of T3, new since 12/07/2023. There is less than 10% vertebral body height loss. No retropulsion.  Sclerosis in the superior endplate of T3 is new compared to 12/07/2023. Soft tissues and spinal canal: No prevertebral fluid or swelling. No visible canal hematoma. Disc levels: Degenerative pannus formation about the atlantoaxial joint is unchanged. Multilevel spondylosis and facet arthropathy is unchanged. No severe spinal canal narrowing. Upper chest: No acute abnormality. Other: Carotid calcification. IMPRESSION: 1. No acute intracranial abnormality. Left forehead hematoma. 2. No acute fracture in the cervical spine. 3. Age indeterminate superior endplate compression fracture of T3. There is less than 10% vertebral body height loss. No retropulsion. 4. 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 Electronically Signed   By: Minerva Fester M.D.   On: 02/27/2024 02:32   DG Pelvis Portable Result Date: 02/27/2024 CLINICAL DATA:  Level 2 trauma, fall, on blood thinners EXAM: PORTABLE PELVIS 1-2 VIEWS COMPARISON:  12/07/2023 FINDINGS: No acute fracture or dislocation. Degenerative changes pubic symphysis, both hips, SI joints and lower lumbar spine. IMPRESSION: No acute fracture or dislocation. Electronically Signed   By: Minerva Fester M.D.   On: 02/27/2024 02:04   DG Chest Portable 1 View Result Date: 02/27/2024 CLINICAL DATA:  Level 2 trauma, fall on blood thinners EXAM: PORTABLE CHEST 1 VIEW COMPARISON:  12/07/2023 FINDINGS: Sternotomy and CABG. Stable cardiomediastinal silhouette. Aortic atherosclerotic calcification. Elevated right hemidiaphragm and right basilar atelectasis. Additional left basilar atelectasis. Otherwise no focal consolidation. No pleural effusion or pneumothorax. No displaced rib fractures. IMPRESSION: No change from 12/07/2023. Bibasilar atelectasis. Elevated right hemidiaphragm. Electronically Signed   By: Minerva Fester M.D.   On: 02/27/2024 02:03   Microbiology: Results for orders placed or performed during the hospital encounter of  03/03/24  Urine Culture     Status: Abnormal   Collection Time: 03/03/24  2:24 PM   Specimen: Urine, Clean Catch  Result Value Ref Range Status   Specimen Description   Final    URINE, CLEAN CATCH Performed at [facility], [address], [place], [place] [address]    Special Requests   Final    NONE Performed at [facility], [address] [place], [place] [address]    Culture MULTIPLE SPECIES PRESENT, SUGGEST RECOLLECTION (A)  Final   Report Status 03/05/2024 FINAL  Final  MRSA Next Gen by PCR, Nasal     Status: Abnormal   Collection Time: 03/04/24 12:42 PM   Specimen: Nasal Mucosa; Nasal Swab  Result Value Ref Range Status   MRSA by PCR Next Gen DETECTED (A) NOT DETECTED Final    Comment: (NOTE) The GeneXpert MRSA Assay (FDA approved for NASAL specimens only), is one component of a comprehensive MRSA colonization surveillance program. It is not intended to diagnose MRSA infection nor to guide or monitor treatment for MRSA infections. Test performance is not FDA approved in patients less than 39 years old. Performed at [facility], [address], [place], [place] [address]    Labs: CBC: Recent Labs  Lab 03/03/24 1554 03/04/24 0849 03/04/24 1939 03/05/24 1817 03/06/24 0927 03/07/24 0612  WBC 9.5  --   --  7.7 6.5 5.9  NEUTROABS  --   --   --  3.9 3.6 3.0  HGB 7.3* 6.9* 8.8* 9.6* 9.2* 9.0*  HCT 24.1* 21.9* 27.6* 30.3* 29.1* 27.9*  MCV 104.8*  --   --  101.7* 99.3 100.0  PLT 232  --   --  233 253 233   Basic Metabolic Panel: Recent Labs  Lab 03/03/24 1554 03/05/24 1817 03/06/24 0927 03/07/24 0612  NA 133* 137 138 135  K 4.0 4.0 3.8 3.8  CL 104 106 109 110  CO2 20* 17* 22 21*  GLUCOSE 129* 143* 137* 101*  BUN 25* 27* 21 25*  CREATININE 1.18* 1.09* 1.27* 1.08*  CALCIUM 8.4* 8.7* 8.5* 8.0*  MG  --  1.9 1.8 1.8  PHOS  --  4.1 4.0 3.7   Liver Function Tests: Recent Labs  Lab 03/05/24 1817 03/06/24 0927  03/07/24 0612  AST 39 30 26  ALT 26 24 23   ALKPHOS 165* 165* 162*  BILITOT 0.8 0.8 0.6  PROT 6.3* 6.2* 5.7*  ALBUMIN 2.4* 2.3* 2.3*   CBG: Recent Labs  Lab 03/07/24 1217 03/07/24 1258 03/07/24 1612 03/07/24 2032 03/08/24 0816  GLUCAP 101* 133* 133* 104* 128*   Discharge time spent: greater than 30 minutes.  Signed: Marguerita Merles, DO [facility] 03/08/2024

## 2024-03-08 NOTE — Progress Notes (Signed)
 Report called and given to Nurse at [facility] at this time. Awaiting transport pickup.

## 2024-03-08 NOTE — Progress Notes (Signed)
 Occupational Therapy Treatment Patient Details Name: Kathryn Carlson MRN: [id number] DOB: 1927-12-12 Today's Date: 03/08/2024   History of present illness Patient is a 89 y/o female admitted 03/03/24 from her ALF with recent multiple falls and L LE weakness,  Found to have UTI, L hip subcutaneous hematoma but no acute fracture, and pt with periorbital hematoma from fracture seen in ED a week ago due to fall without issues on her scans then, though does have T3 compression fracture of unknown age on thoracic CT.  PMH positive for CAD, GERD, HTN, hypothyroid, multiple SCC skin removal, and h/o B TKA.   OT comments  Pt progressing toward goals this session, needing mod +2 for transfer to chair with RW. Pt reports LLE feels like it gives out. Educated on BLE exercises for strengthening while in chair. Pt able to don brief with mod A, and stands to pull up. Pt presenting with impairments listed below, will follow acutely. Patient will benefit from continued inpatient follow up therapy, <3 hours/day to maximize safety/ind with ADL/functional mobility.       If plan is discharge home, recommend the following:  Two people to help with walking and/or transfers;A lot of help with bathing/dressing/bathroom;Assistance with cooking/housework;Direct supervision/assist for medications management;Assistance with feeding;Direct supervision/assist for financial management;Assist for transportation;Help with stairs or ramp for entrance   Equipment Recommendations  Other (comment) (defer)    Recommendations for Other Services PT consult    Precautions / Restrictions Precautions Precautions: Fall Recall of Precautions/Restrictions: Intact Precaution/Restrictions Comments: watch BP Restrictions Weight Bearing Restrictions Per Provider Order: No       Mobility Bed Mobility Overal bed mobility: Needs Assistance Bed Mobility: Supine to Sit     Supine to sit: HOB elevated, Used rails, Mod assist           Transfers Overall transfer level: Needs assistance Equipment used: Rolling walker (2 wheels) Transfers: Sit to/from Stand, Bed to chair/wheelchair/BSC Sit to Stand: Mod assist, +2 physical assistance, +2 safety/equipment     Step pivot transfers: Mod assist, +2 physical assistance           Balance Overall balance assessment: Needs assistance Sitting-balance support: Feet supported Sitting balance-Leahy Scale: Good Sitting balance - Comments: reaches outside BOS without LOB   Standing balance support: Bilateral upper extremity supported Standing balance-Leahy Scale: Poor                             ADL either performed or assessed with clinical judgement   ADL Overall ADL's : Needs assistance/impaired                     Lower Body Dressing: Moderate assistance Lower Body Dressing Details (indicate cue type and reason): donning brief             Functional mobility during ADLs: Moderate assistance;Rolling walker (2 wheels);+2 for physical assistance      Extremity/Trunk Assessment Upper Extremity Assessment Upper Extremity Assessment: Generalized weakness (limited shoulder ROM L >R)   Lower Extremity Assessment Lower Extremity Assessment: Defer to PT evaluation        Vision   Vision Assessment?: No apparent visual deficits   Perception Perception Perception: Not tested   Praxis Praxis Praxis: Not tested   Communication Communication Communication: Impaired Factors Affecting Communication: Hearing impaired   Cognition Arousal: Alert Behavior During Therapy: WFL for tasks assessed/performed Cognition: No apparent impairments  Following commands: Intact        Cueing      Exercises Exercises: Other exercises Other Exercises Other Exercises: seated kicks x10    Shoulder Instructions       General Comments daughter present and supportive    Pertinent Vitals/ Pain        Pain Assessment Pain Assessment: Faces Pain Score: 4  Faces Pain Scale: Hurts little more Pain Location: L knee Pain Descriptors / Indicators: Discomfort, Aching, Sore Pain Intervention(s): Limited activity within patient's tolerance, Monitored during session, Repositioned  Home Living                                          Prior Functioning/Environment              Frequency  Min 1X/week        Progress Toward Goals  OT Goals(current goals can now be found in the care plan section)  Progress towards OT goals: Progressing toward goals  Acute Rehab OT Goals Patient Stated Goal: to get better OT Goal Formulation: With patient/family Time For Goal Achievement: 03/19/24 Potential to Achieve Goals: Good ADL Goals Pt Will Perform Grooming: with set-up;standing Pt Will Perform Upper Body Dressing: with contact guard assist;sitting Pt Will Perform Lower Body Dressing: with min assist;sitting/lateral leans;sit to/from stand Pt Will Transfer to Toilet: with min assist;squat pivot transfer;stand pivot transfer;bedside commode Pt/caregiver will Perform Home Exercise Program: Both right and left upper extremity;Increased ROM;Increased strength;With written HEP provided;With Supervision  Plan      Co-evaluation                 AM-PAC OT "6 Clicks" Daily Activity     Outcome Measure   Help from another person eating meals?: A Little Help from another person taking care of personal grooming?: A Little Help from another person toileting, which includes using toliet, bedpan, or urinal?: A Lot Help from another person bathing (including washing, rinsing, drying)?: A Lot Help from another person to put on and taking off regular upper body clothing?: A Lot Help from another person to put on and taking off regular lower body clothing?: A Lot 6 Click Score: 14    End of Session Equipment Utilized During Treatment: Gait belt  OT Visit Diagnosis:  Unsteadiness on feet (R26.81);Other abnormalities of gait and mobility (R26.89);Muscle weakness (generalized) (M62.81);History of falling (Z91.81);Repeated falls (R29.6)   Activity Tolerance Patient tolerated treatment well   Patient Left in chair;with call bell/phone within reach;with chair alarm set;with family/visitor present   Nurse Communication Mobility status        Time: 1610-9604 OT Time Calculation (min): 25 min  Charges: OT General Charges $OT Visit: 1 Visit OT Treatments $Self Care/Home Management : 8-22 mins $Therapeutic Activity: 8-22 mins  Carver Fila, OTD, OTR/L SecureChat Preferred Acute Rehab [phone]   Carver Fila Koonce 03/08/2024, 12:28 PM

## 2024-03-08 NOTE — Care Management Important Message (Signed)
 Important Message  Patient Details  Name: Kathryn Carlson MRN: [id number] Date of Birth: [birth year]   Important Message Given:  Yes - Medicare IM     Dorena Bodo 03/08/2024, 1:37 PM

## 2024-03-08 NOTE — Plan of Care (Signed)

## 2024-03-08 NOTE — Progress Notes (Signed)
 Patient looks stable has no distress feels a little bit burning in her fingers Is weaker on the left side with straight leg raise overall looks well She is stable for discharge to facility when bed is available--see updated summary  Pleas Koch, MD [facility] 10:14 AM

## 2024-03-09 ENCOUNTER — Non-Acute Institutional Stay (SKILLED_NURSING_FACILITY): Payer: Self-pay | Admitting: Nurse Practitioner

## 2024-03-09 ENCOUNTER — Encounter: Payer: Self-pay | Admitting: Nurse Practitioner

## 2024-03-09 DIAGNOSIS — N952 Postmenopausal atrophic vaginitis: Secondary | ICD-10-CM | POA: Diagnosis not present

## 2024-03-09 DIAGNOSIS — E559 Vitamin D deficiency, unspecified: Secondary | ICD-10-CM

## 2024-03-09 DIAGNOSIS — I2510 Atherosclerotic heart disease of native coronary artery without angina pectoris: Secondary | ICD-10-CM | POA: Diagnosis not present

## 2024-03-09 DIAGNOSIS — E876 Hypokalemia: Secondary | ICD-10-CM | POA: Diagnosis not present

## 2024-03-09 DIAGNOSIS — E785 Hyperlipidemia, unspecified: Secondary | ICD-10-CM

## 2024-03-09 DIAGNOSIS — D539 Nutritional anemia, unspecified: Secondary | ICD-10-CM | POA: Diagnosis not present

## 2024-03-09 DIAGNOSIS — M1990 Unspecified osteoarthritis, unspecified site: Secondary | ICD-10-CM | POA: Diagnosis not present

## 2024-03-09 DIAGNOSIS — R609 Edema, unspecified: Secondary | ICD-10-CM | POA: Diagnosis not present

## 2024-03-09 DIAGNOSIS — E119 Type 2 diabetes mellitus without complications: Secondary | ICD-10-CM

## 2024-03-09 DIAGNOSIS — I10 Essential (primary) hypertension: Secondary | ICD-10-CM

## 2024-03-09 DIAGNOSIS — R350 Frequency of micturition: Secondary | ICD-10-CM | POA: Diagnosis not present

## 2024-03-09 DIAGNOSIS — E039 Hypothyroidism, unspecified: Secondary | ICD-10-CM

## 2024-03-09 NOTE — Progress Notes (Unsigned)
 Location:  [facility] Room Number: N030-A Place of Service:  ALF [phone]) Provider:  Jazmyne Beauchesne X, NP  Patient Care Team: Tom Macpherson X, NP as PCP - General (Internal Medicine) Allyson Sabal Delton See, MD as PCP - Cardiology (Cardiology) Janalyn Harder, MD (Inactive) as Consulting Physician (Dermatology)  Extended Emergency Contact Information Primary Emergency Contact: [name],[name] Address: [address]          [place] [address] [place] Home Phone: [phone] Mobile Phone: [phone] Relation: Daughter Secondary Emergency Contact: [name] [place] of [place] Home Phone: [phone] Mobile Phone: [phone] Relation: Daughter  Code Status:  Full Code  Goals of care: Advanced Directive information    03/03/2024    1:55 PM  Advanced Directives  Does Patient Have a Medical Advance Directive? No  Would patient like information on creating a medical advance directive? No - Patient declined     Chief Complaint  Patient presents with  . Medication Consultation    Medication review     HPI:  Pt is a 89 y.o. female seen today for an acute visit for medication review follow hospital stay.   Hospitalized 03/03/24-03/07/24 for generalized weakness/LLE pain and weakness/numbness,  UTI, T3 fx,  f/u neurosurgeon, f/u CBC, CMP, Mg, P 1 week  Severe spinal cord compression at C5-C6 with multi level spondylosis, contributory for falls, evaluated by Neurosurgery-not a surgical candidate  LLE weakness, not pain but some numbness.   Hyponatremia, 133<<135   The left knee pain and bruise(Xray L tibia/fibula negative fx), skin tear left elbow-healing, hematoma left forehead sustained from fall 02/26/24(CT head/cervical spine unremarkable)-healing. ED eval 02/27/24.   Negative for fx or displacement  X-ray pelvis 12/07/23, lower back pain is managed on Norco-better.              CT cervical spine 02/27/24, Age indeterminate superior  endplate compression fracture of T3. Pending HPOA decision: f/u neurosurgery.               HTN, taking Metoprolol, Amlodipine, Bun/creat 18/0.82 02/28/24             CAD/aortic stenosis mild,  hx of CABG, dc'd Plavix, continue ASA, Atorvastatin, Isosorbide.              Vit D deficiency, on Vit D             Atrophic vaginitis, taking Estrace vaginal cream.              Anemia, Iron 25, Vit B12 325 01/25/24, placed on Fe, Hgb 8.8 02/28/24, gluteal hematoma, s/p transfused,  avoid plavix             Hypokalemia, K 4.3 02/28/24, supplemented with Kcl.              T2DM, Hgb A1c 6.5 01/06/24, diet controlled.              Edema BLE, chronic, minimal             Hypothyroidism, TSH 2.78 01/06/24, on Levothyroxine, f/u TSH 4 weeks.              HLD LDL 84 01/06/24, on Atorvastatin.              Urinary frequency, increased urinary frequency, incontinence, but denied dysuria, she is afebrile.   Past Medical History:  Diagnosis Date  . BCC (basal cell carcinoma of skin) 01/09/2009   Lower Central Back (tx p bx)  . CAD (  coronary artery disease)    possible ant wall MI ('97) - cath & CABG x5  . Exogenous obesity   . GERD (gastroesophageal reflux disease)   . History of nuclear stress test 09/2012   lexiscan; mild perfusion defect in apical anterior & apical region (infarct/scar) - no significant ischemia, low risk   . History of total bilateral knee replacement   . Hypertension   . Hypothyroidism   . Nodular basal cell carcinoma (BCC) 08/17/2017   Left Calf (tx p bx)  . PVC's (premature ventricular contractions)   . SCC (squamous cell carcinoma) [place] 01/29/2004   Right Forearm (Cx3,5FU)  . SCCA (squamous cell carcinoma) of skin 03/13/2005   Mid Upper Back  . SCCA (squamous cell carcinoma) of skin 09/07/2005   Left Elbow(Cx3,5FU)  . SCCA (squamous cell carcinoma) of skin 01/25/2006   Left Brow(in situ) (Cx3,5FU)  . SCCA (squamous cell carcinoma) of skin 04/27/2006   Inner Left  Shoulder(Keratoacanthoma) (Exc.)  . SCCA (squamous cell carcinoma) of skin 03/22/2007   Left Temple(in situ) and Bridge of Nose(in situ) (Cs3,5FU)  . SCCA (squamous cell carcinoma) of skin 05/12/2007   Top of Scalp(Cx3,5FU)  . SCCA (squamous cell carcinoma) of skin 01/28/2011   Right Upper Arm(in situ)  . SCCA (squamous cell carcinoma) of skin 07/26/2012   Glabella, Inferior Tip (Cx3,5FU)  . SCCA (squamous cell carcinoma) of skin 03/08/2013   Left Front Scalp(in situ) and Upper Nose(in situ) (Cx3,5FU)  . SCCA (squamous cell carcinoma) of skin 07/24/2013   Right Lower Leg(Keratoacanthoma)  . SCCA (squamous cell carcinoma) of skin 09/11/2015   Left Scalp(in situ) (Cx3,5FU)  . SCCA (squamous cell carcinoma) of skin 01/15/2016   Left Forearm(in situ) and Left Temple(in situ) (tx p bx)  . SCCA (squamous cell carcinoma) of skin 10/01/2016   Left Mid Forearm(in situ)(tx p bx) and Left Front Scalp(watch)  . SCCA (squamous cell carcinoma) of skin 11/12/2016   Left Temple(Keratoacanthoma) (watch)  . SCCA (squamous cell carcinoma) of skin 02/11/2017   Top Left Hand(Keratoacanthoma) (tx p bx)  . Squamous cell carcinoma in situ (SCCIS) 11/18/1999   Above Left Outer Eyebrow  . Squamous cell carcinoma of scalp    removed - Dr. Jorja Loa  . Superficial basal cell carcinoma (BCC) 07/14/2004   Left Scapula (Cx3,5FU)   Past Surgical History:  Procedure Laterality Date  . ABDOMINAL HYSTERECTOMY  1984  . APPENDECTOMY  1941  . Carotid Doppler  12/07/2012   R & L ICA - 0-49% diameter reduction  . CORONARY ARTERY BYPASS GRAFT  09/1996   cath & CABG x5 LIMA-LAD, SVG-sequential OD & OM, SVG sequential to PDA & PLA (Dr. Donata Clay)  . REPLACEMENT TOTAL KNEE Bilateral 2001 & 2003  . TONSILLECTOMY    . TRANSTHORACIC ECHOCARDIOGRAM  08/23/2013   EF 50-55%, LV cavity size mod reduced, mild LVH, mild conc hypertrophy; mild AV stenosis & mild regurg; mild MR - ordered for murmur    Allergies  Allergen  Reactions  . Mobic [Meloxicam] Other (See Comments)    Unknown reaction  . Novocain [Procaine] Other (See Comments)    Unknown reaction    Outpatient Encounter Medications as of 03/09/2024  Medication Sig  . acetaminophen (TYLENOL) 325 MG tablet Take 650 mg by mouth every 6 (six) hours as needed.  Marland Kitchen amLODipine (NORVASC) 5 MG tablet Take 5 mg by mouth daily.  . Aspirin 81 MG CAPS Take 81 mg by mouth daily.  Marland Kitchen atorvastatin (LIPITOR) 40 MG tablet TAKE 1  TABLET BY MOUTH EVERY DAY  . cefdinir (OMNICEF) 300 MG capsule Take 1 capsule (300 mg total) by mouth 2 (two) times daily for 4 days.  . cholecalciferol (VITAMIN D3) 25 MCG (1000 UNIT) tablet Take 1,000 Units by mouth daily.  . diclofenac Sodium (VOLTAREN) 1 % GEL Apply 2 g topically 4 (four) times daily.  Marland Kitchen estradiol (ESTRACE) 0.1 MG/GM vaginal cream Place 1 Applicatorful vaginally See admin instructions. Apply 1g vaginally in the evening on Monday, Wednesday, Friday.  . ferrous sulfate 325 (65 FE) MG EC tablet Take 325 mg by mouth every Monday, Wednesday, and Friday.  Marland Kitchen HYDROcodone-acetaminophen (NORCO/VICODIN) 5-325 MG tablet Take 0.5 tablets by mouth every 6 (six) hours as needed for moderate pain (pain score 4-6).  . hydrocortisone 2.5 % cream Apply 1 Application topically at bedtime.  Marland Kitchen levothyroxine (SYNTHROID) 75 MCG tablet Take 75 mcg by mouth daily before breakfast.  . lidocaine 4 % Place 1 patch onto the skin daily. Apply patch at 0800 and remove patch at at 2000  . methocarbamol (ROBAXIN) 500 MG tablet Take 1 tablet (500 mg total) by mouth every 8 (eight) hours as needed for muscle spasms.  . metoprolol succinate (TOPROL-XL) 50 MG 24 hr tablet Take 1 tablet (50 mg total) by mouth daily. Take with or immediately following a meal.  . Multiple Vitamin (MULTIVITAMIN WITH MINERALS) TABS tablet Take 1 tablet by mouth daily.  . mupirocin ointment (BACTROBAN) 2 % Apply 1 Application topically daily as needed (irritation).  . nitroGLYCERIN  (NITROSTAT) 0.4 MG SL tablet Place 0.4 mg under the tongue every 5 (five) minutes as needed for chest pain.  . polyethylene glycol (MIRALAX / GLYCOLAX) 17 g packet Take 17 g by mouth See admin instructions. Every Mon, Wed, Friday for constipation. Mix with 4 ounces of fluid.  . potassium chloride (KLOR-CON) 10 MEQ tablet Take 10 mEq by mouth daily as needed. (Patient not taking: Reported on 03/09/2024)   No facility-administered encounter medications on file as of 03/09/2024.    Review of Systems  Immunization History  Administered Date(s) Administered  . Fluad Quad(high Dose 65+) 08/30/2019  . Influenza, High Dose Seasonal PF 10/10/2018, 09/29/2023  . Influenza,inj,quad, With Preservative 08/30/2019  . Pneumococcal Conjugate-13 01/28/2013  . Td (Adult) 09/27/2022  . Unspecified SARS-COV-2 Vaccination 01/01/2020, 09/16/2021   Pertinent  Health Maintenance Due  Topic Date Due  . FOOT EXAM  Never done  . OPHTHALMOLOGY EXAM  Never done  . HEMOGLOBIN A1C  07/05/2024  . INFLUENZA VACCINE  Completed  . DEXA SCAN  Completed      07/09/2021   10:00 PM 07/10/2021    8:53 AM 07/10/2021    9:00 PM 07/11/2021    8:40 AM 07/21/2021   10:02 AM  Fall Risk  (RETIRED) Patient Fall Risk Level Moderate fall risk Moderate fall risk Moderate fall risk Moderate fall risk Low fall risk   Functional Status Survey:    Vitals:   03/09/24 1046  BP: (!) 158/72  Pulse: 82  SpO2: 95%  Weight: 162 lb 12.8 oz (73.8 kg)  Height: 5\' 6"  (1.676 m)   Body mass index is 26.28 kg/m. Physical Exam  Labs reviewed: Recent Labs    03/05/24 1817 03/06/24 0927 03/07/24 0612  NA 137 138 135  K 4.0 3.8 3.8  CL 106 109 110  CO2 17* 22 21*  GLUCOSE 143* 137* 101*  BUN 27* 21 25*  CREATININE 1.09* 1.27* 1.08*  CALCIUM 8.7* 8.5* 8.0*  MG 1.9  1.8 1.8  PHOS 4.1 4.0 3.7   Recent Labs    03/05/24 1817 03/06/24 0927 03/07/24 0612  AST 39 30 26  ALT 26 24 23   ALKPHOS 165* 165* 162*  BILITOT 0.8 0.8 0.6   PROT 6.3* 6.2* 5.7*  ALBUMIN 2.4* 2.3* 2.3*   Recent Labs    03/05/24 1817 03/06/24 0927 03/07/24 0612  WBC 7.7 6.5 5.9  NEUTROABS 3.9 3.6 3.0  HGB 9.6* 9.2* 9.0*  HCT 30.3* 29.1* 27.9*  MCV 101.7* 99.3 100.0  PLT 233 253 233   Lab Results  Component Value Date   TSH 6.214 (H) 03/07/2024   Lab Results  Component Value Date   HGBA1C 6.5 01/06/2024   Lab Results  Component Value Date   CHOL 151 01/06/2024   HDL 48 01/06/2024   LDLCALC 84 01/06/2024   TRIG 101 01/06/2024   CHOLHDL 3.0 07/09/2021    Significant Diagnostic Results in last 30 days:  MR Cervical Spine W and Wo Contrast Result Date: 03/04/2024 CLINICAL DATA:  89 year old female status post fall. Age indeterminate T3 compression fracture and nonspecific epidural gas within the cervical spine by CT. EXAM: MRI CERVICAL SPINE WITHOUT AND WITH CONTRAST TECHNIQUE: Multiplanar and multiecho pulse sequences of the cervical spine, to include the craniocervical junction and cervicothoracic junction, were obtained without and with intravenous contrast. CONTRAST:  7mL GADAVIST GADOBUTROL 1 MMOL/ML IV SOLN COMPARISON:  CT cervical spine 02/27/2024, thoracic spine 03/03/2024. FINDINGS: Intermittently motion degraded exam, especially most of the sagittal cervical imaging. Alignment: Multilevel degenerative appearing spondylolisthesis is stable, worst at C5-C6 (3-4 mm there). Vertebrae: Normal background bone marrow signal. T3 mild superior endplate compression appears chronic and No marrow edema or evidence of acute osseous abnormality. Cord: Degenerative appearing spinal cord compression at C5-C6, up to moderate (series 6, image 10). Limited spinal cord detail on this exam but no obvious spinal cord signal changes. C5 and C6 have been the epicenter of the abnormal spinal canal gas on previous CTs. See additional details below. On postcontrast axial images there is mild abnormal enhancement within the spinal canal at C5-C6, primarily in  the posterior midline which appears superimposed on a small volume of gas series 14, image 32) and medial to chronically degenerated facets there (series 14, image 33) where there is rim enhancement of un intracanalicular space-occupying lesion which is 5-6 mm in thickness. Contralateral right C5-C6 hyperdense canal space-occupying mass by CT appears to be low-density on MRI best seen on series 9, image 29, nonenhancing. There is no generalized dural thickening or enhancement. And no abnormal intradural enhancement identified. Posterior Fossa, vertebral arteries, paraspinal tissues: Mild degenerative mass effect at the cervicomedullary junction. No cervicomedullary, visible brainstem or cerebellar signal abnormality or abnormal enhancement. Preserved major vascular flow voids in the neck. No cervical paraspinal soft tissue inflammation or mass identified. Negative visible right lung apex. Disc levels: Advanced cervical spine degeneration most notable for C1-C2: Bulky ligamentous hypertrophy about the odontoid which is partially calcified by CT, superimposed on bulky C1-C2 degeneration. And there is also bulky partially calcified ligament flavum hypertrophy at that level. Combined there is C1-C2 level spinal stenosis with up to mild spinal cord mass effect (series 6 image 7). C5-C6: Spondylolisthesis of 3, up to 4 mm associated with severe bilateral facet arthropathy, bilateral posterior element hypertrophy including apparent ligamentous hypertrophy which by CT is hyperdense and possibly calcified, low signal on this exam. Abnormal mostly midline posterior epidural space gas and left side rim calcified space-occupying lesion as  seen on series 14, image 33. Moderate to severe spinal stenosis and spinal cord mass effect (series 9, image 33 and series 6, image 10). Bilateral C6 foraminal stenosis is moderate to severe, worse on the right. C6-C7 and C7-T1 less pronounced spondylolisthesis. No significant spinal stenosis  at those levels. Bilateral foraminal stenosis. IMPRESSION: 1. Highly abnormal cervical spine C5-C6 level. Suboptimal MRI resolution but the constellation of CT and MRI findings favors advanced degenerative etiology - such as synovial cyst(s) and/or ligament flavum hypertrophy with partial calcification, associated vacuum phenomena - superimposed on spondylolisthesis and severe facet arthropathy. No strong evidence of spinal infection or malignancy. 2. High-grade spinal stenosis associated with #1 with cord compression there, but no spinal cord signal abnormality identified. 3. Other advanced cervical spine degeneration, most notably C1-C2 where bulky and partially calcified ligamentous hypertrophy both ventral and dorsal to the spinal cord. Spinal stenosis with less pronounced C1-C2 cord mass effect. 4. Chronic T3 superior endplate compression. No marrow edema or acute osseous abnormality identified. Electronically Signed   By: Odessa Fleming M.D.   On: 03/04/2024 04:49   CT ABDOMEN PELVIS W CONTRAST Result Date: 03/03/2024 CLINICAL DATA:  Abdominal trauma, blunt.  Multiple falls. EXAM: CT ABDOMEN AND PELVIS WITH CONTRAST TECHNIQUE: Multidetector CT imaging of the abdomen and pelvis was performed using the standard protocol following bolus administration of intravenous contrast. RADIATION DOSE REDUCTION: This exam was performed according to the departmental dose-optimization program which includes automated exposure control, adjustment of the mA and/or kV according to patient size and/or use of iterative reconstruction technique. CONTRAST:  OMNIPAQUE IOHEXOL 300 MG/ML  SOLN COMPARISON:  09/20/2019. FINDINGS: Lower chest: The heart is enlarged and there is no pericardial effusion. Multi-vessel coronary artery calcifications are noted there is a trace left pleural effusion. Atelectasis is present at the lung bases bilaterally. Hepatobiliary: No focal liver abnormality is seen. No gallstones, gallbladder wall  thickening, or biliary dilatation. Pancreas: Unremarkable. No pancreatic ductal dilatation or surrounding inflammatory changes. Spleen: Normal in size without focal abnormality. Adrenals/Urinary Tract: The adrenal glands are within normal limits. The kidneys enhance symmetrically. Renal atrophy is noted on the left. There are scattered hypodensities in the kidneys bilaterally which are too small to further characterize. A hyperdense lesion is noted in the mid left kidney measuring 1.2 cm with attenuation of 103 Hounsfield units, likely proteinaceous or hemorrhagic cyst. No renal calculus or hydronephrosis bilaterally. There is mild bladder wall thickening. Stomach/Bowel: Stomach is within normal limits. Appendix is not seen. No evidence of bowel wall thickening, distention, or inflammatory changes. No free air or pneumatosis. Scattered diverticula are present along the colon without evidence of diverticulitis. Vascular/Lymphatic: Aortic atherosclerosis. No enlarged abdominal or pelvic lymph nodes. Reproductive: Status post hysterectomy. No adnexal masses. Other: No abdominopelvic ascites. A fat containing umbilical hernia is noted. Musculoskeletal: Scattered densities and subcutaneous fat stranding is noted over the left lateral hip, the largest measuring 4.3 x 2.6 x 4.8 cm, compatible with hematoma. Hyperdense attenuation is noted in the distal gluteal muscle on the left. Degenerative changes are present in the thoracolumbar spine and bilateral hips. Sternotomy wires are noted. IMPRESSION: 1. Subcutaneous hematoma and contusions over the left hip and likely small hematoma in the distal gluteal muscle on the left. No acute fracture is seen. 2. Trace left pleural effusion with atelectasis at the lung bases. 3. Diverticulosis without diverticulitis. 4. Aortic atherosclerosis. Electronically Signed   By: Thornell Sartorius M.D.   On: 03/03/2024 22:30   CT Thoracic Spine Wo  Contrast Result Date: 03/03/2024 CLINICAL DATA:   Fall EXAM: CT THORACIC SPINE WITHOUT CONTRAST TECHNIQUE: Multidetector CT images of the thoracic were obtained using the standard protocol without intravenous contrast. RADIATION DOSE REDUCTION: This exam was performed according to the departmental dose-optimization program which includes automated exposure control, adjustment of the mA and/or kV according to patient size and/or use of iterative reconstruction technique. COMPARISON:  None Available. FINDINGS: Alignment: Normal. Vertebrae: There is a minimally depressed wedge compression fracture of T3. No other acute osseous abnormality. Vertebral body heights are otherwise maintained. Paraspinal and other soft tissues: There is small volume pneumorachis of the lower cervical spine, similar to 02/27/2024. Disc levels: There is no spinal canal stenosis. IMPRESSION: 1. Minimally depressed wedge compression fracture of T3. 2. Small amount of epidural gas with adjacent hyperdensity within the lower cervical spine, unchanged since 02/27/2024. While this is somewhat nonspecific, epidural infection can cause this appearance. MRI with and without contrast of the cervical spine is recommended. Electronically Signed   By: Deatra Robinson M.D.   On: 03/03/2024 22:12   DG Lumbar Spine Complete Result Date: 03/03/2024 CLINICAL DATA:  Pain after fall. EXAM: LUMBAR SPINE - COMPLETE 4+ VIEW COMPARISON:  None Available. FINDINGS: Lateral views are limited by positioning. Lower aspect of the lumbar spine is not included in the field of view. Five non-rib-bearing lumbar vertebra. Dextroscoliotic curvature. Trace retrolisthesis at tentatively L2-L3. No evidence of acute fracture or compression deformity. There is multilevel degenerative disc disease and facet hypertrophy. The sacroiliac joints are congruent. IMPRESSION: 1. Technically limited exam. Allowing for this, no acute fracture or subluxation of the lumbar spine. 2. Dextroscoliotic curvature with multilevel degenerative disc  disease and facet hypertrophy. Electronically Signed   By: Narda Rutherford M.D.   On: 03/03/2024 18:42   DG Ankle Complete Left Result Date: 03/03/2024 CLINICAL DATA:  Pain after fall. EXAM: LEFT ANKLE COMPLETE - 3+ VIEW COMPARISON:  Included portion from tibia/fibular radiographs 02/27/2024 FINDINGS: There is no evidence of fracture, dislocation, or joint effusion. Ankle mortise is preserved. Mild chronic spurring of the medial and lateral malleoli. Plantar calcaneal spur and Achilles tendon enthesophyte. Vascular calcifications are seen. IMPRESSION: 1. No fracture or subluxation of the left ankle. 2. Plantar calcaneal spur and Achilles tendon enthesophyte. Electronically Signed   By: Narda Rutherford M.D.   On: 03/03/2024 18:41   CT Maxillofacial Wo Contrast Result Date: 03/03/2024 CLINICAL DATA:  Facial trauma, blunt.  Multiple falls. EXAM: CT MAXILLOFACIAL WITHOUT CONTRAST TECHNIQUE: Multidetector CT imaging of the maxillofacial structures was performed. Multiplanar CT image reconstructions were also generated. RADIATION DOSE REDUCTION: This exam was performed according to the departmental dose-optimization program which includes automated exposure control, adjustment of the mA and/or kV according to patient size and/or use of iterative reconstruction technique. COMPARISON:  CT head without contrast 02/27/2024. FINDINGS: Osseous: No acute fractures are present. Mandible is intact and located. Prominent soft tissue pannus is present about the dens with some erosive changes consistent with rheumatoid arthritis. No acute fractures are present. Orbits: Bilateral lens replacements are noted. Globes and orbits are otherwise unremarkable. Sinuses: The paranasal sinuses and mastoid air cells are clear. Soft tissues: The left supraorbital scalp hematoma seen previously is near completely resolved. No underlying fracture is present. No new soft tissue injury is present. Limited intracranial: Within normal limits for  age.  Stable. IMPRESSION: 1. No acute fracture or acute injury to the facial bones. 2. Near complete resolution of left supraorbital scalp hematoma seen previously. 3. Prominent soft  tissue pannus about the dens with some erosive changes consistent with rheumatoid arthritis. Electronically Signed   By: Marin Roberts M.D.   On: 03/03/2024 17:25   DG Tibia/Fibula Left Result Date: 02/27/2024 CLINICAL DATA:  Recent fall with left leg pain, initial encounter EXAM: LEFT TIBIA AND FIBULA - 2 VIEW COMPARISON:  None Available. FINDINGS: Left knee joint replacement is seen. Vascular calcifications are identified. No acute fracture or dislocation is noted. No soft tissue abnormality is seen. IMPRESSION: No acute abnormality noted. Electronically Signed   By: Alcide Clever M.D.   On: 02/27/2024 03:48   CT Head Wo Contrast Result Date: 02/27/2024 CLINICAL DATA:  Level 2 fall, on blood thinners, hematoma to left forehead EXAM: CT HEAD WITHOUT CONTRAST CT CERVICAL SPINE WITHOUT CONTRAST TECHNIQUE: Multidetector CT imaging of the head and cervical spine was performed following the standard protocol without intravenous contrast. Multiplanar CT image reconstructions of the cervical spine were also generated. RADIATION DOSE REDUCTION: This exam was performed according to the departmental dose-optimization program which includes automated exposure control, adjustment of the mA and/or kV according to patient size and/or use of iterative reconstruction technique. COMPARISON:  CT head and C-spine 12/07/2023 FINDINGS: CT HEAD FINDINGS Brain: No intracranial hemorrhage, mass effect, or evidence of acute infarct. No hydrocephalus. 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 Age-commensurate cerebral atrophy and chronic small vessel ischemic disease. Vascular: No hyperdense vessel. Intracranial arterial calcification. Skull: No fracture or focal lesion.  Left forehead hematoma.  Sinuses/Orbits: No acute finding. Other: None. CT CERVICAL SPINE FINDINGS Alignment: No evidence of traumatic malalignment. Chronic subluxations of multiple vertebral bodies are unchanged. Skull base and vertebrae: No acute fracture in the cervical spine. Age indeterminate superior endplate compression fracture of T3, new since 12/07/2023. There is less than 10% vertebral body height loss. No retropulsion. Sclerosis in the superior endplate of T3 is new compared to 12/07/2023. Soft tissues and spinal canal: No prevertebral fluid or swelling. No visible canal hematoma. Disc levels: Degenerative pannus formation about the atlantoaxial joint is unchanged. Multilevel spondylosis and facet arthropathy is unchanged. No severe spinal canal narrowing. Upper chest: No acute abnormality. Other: Carotid calcification. IMPRESSION: 1. No acute intracranial abnormality. Left forehead hematoma. 2. No acute fracture in the cervical spine. 3. Age indeterminate superior endplate compression fracture of T3. There is less than 10% vertebral body height loss. No retropulsion. 4. 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 Electronically Signed   By: Minerva Fester M.D.   On: 02/27/2024 02:32   CT Cervical Spine Wo Contrast Result Date: 02/27/2024 CLINICAL DATA:  Level 2 fall, on blood thinners, hematoma to left forehead EXAM: CT HEAD WITHOUT CONTRAST CT CERVICAL SPINE WITHOUT CONTRAST TECHNIQUE: Multidetector CT imaging of the head and cervical spine was performed following the standard protocol without intravenous contrast. Multiplanar CT image reconstructions of the cervical spine were also generated. RADIATION DOSE REDUCTION: This exam was performed according to the departmental dose-optimization program which includes automated exposure control, adjustment of the mA and/or kV according to patient size and/or use of iterative reconstruction technique. COMPARISON:  CT head and C-spine  12/07/2023 FINDINGS: CT HEAD FINDINGS Brain: No intracranial hemorrhage, mass effect, or evidence of acute infarct. No hydrocephalus. 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 Age-commensurate cerebral atrophy and chronic small vessel ischemic disease. Vascular: No hyperdense vessel. Intracranial arterial calcification. Skull: No fracture or focal lesion.  Left forehead hematoma. Sinuses/Orbits: No acute finding. Other: None. CT CERVICAL  SPINE FINDINGS Alignment: No evidence of traumatic malalignment. Chronic subluxations of multiple vertebral bodies are unchanged. Skull base and vertebrae: No acute fracture in the cervical spine. Age indeterminate superior endplate compression fracture of T3, new since 12/07/2023. There is less than 10% vertebral body height loss. No retropulsion. Sclerosis in the superior endplate of T3 is new compared to 12/07/2023. Soft tissues and spinal canal: No prevertebral fluid or swelling. No visible canal hematoma. Disc levels: Degenerative pannus formation about the atlantoaxial joint is unchanged. Multilevel spondylosis and facet arthropathy is unchanged. No severe spinal canal narrowing. Upper chest: No acute abnormality. Other: Carotid calcification. IMPRESSION: 1. No acute intracranial abnormality. Left forehead hematoma. 2. No acute fracture in the cervical spine. 3. Age indeterminate superior endplate compression fracture of T3. There is less than 10% vertebral body height loss. No retropulsion. 4. 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 Electronically Signed   By: Minerva Fester M.D.   On: 02/27/2024 02:32   DG Pelvis Portable Result Date: 02/27/2024 CLINICAL DATA:  Level 2 trauma, fall, on blood thinners EXAM: PORTABLE PELVIS 1-2 VIEWS COMPARISON:  12/07/2023 FINDINGS: No acute fracture or dislocation. Degenerative changes pubic symphysis, both hips, SI joints and lower lumbar  spine. IMPRESSION: No acute fracture or dislocation. Electronically Signed   By: Minerva Fester M.D.   On: 02/27/2024 02:04   DG Chest Portable 1 View Result Date: 02/27/2024 CLINICAL DATA:  Level 2 trauma, fall on blood thinners EXAM: PORTABLE CHEST 1 VIEW COMPARISON:  12/07/2023 FINDINGS: Sternotomy and CABG. Stable cardiomediastinal silhouette. Aortic atherosclerotic calcification. Elevated right hemidiaphragm and right basilar atelectasis. Additional left basilar atelectasis. Otherwise no focal consolidation. No pleural effusion or pneumothorax. No displaced rib fractures. IMPRESSION: No change from 12/07/2023. Bibasilar atelectasis. Elevated right hemidiaphragm. Electronically Signed   By: Minerva Fester M.D.   On: 02/27/2024 02:03    Assessment/Plan There are no diagnoses linked to this encounter.   Family/ staff Communication: ***  Labs/tests ordered:  ***

## 2024-03-10 ENCOUNTER — Encounter: Payer: Self-pay | Admitting: Nurse Practitioner

## 2024-03-10 ENCOUNTER — Ambulatory Visit: Payer: 59 | Admitting: Cardiovascular Disease

## 2024-03-10 DIAGNOSIS — M1990 Unspecified osteoarthritis, unspecified site: Secondary | ICD-10-CM | POA: Insufficient documentation

## 2024-03-10 NOTE — Assessment & Plan Note (Signed)
 TSH 6.214 03/07/24, on Levothyroxine

## 2024-03-10 NOTE — Assessment & Plan Note (Signed)
 Iron 25, Vit B12 325 01/25/24, placed on Fe, Hgb 9.0 03/07/24, gluteal hematoma, s/p transfused,  avoid plavix

## 2024-03-10 NOTE — Assessment & Plan Note (Signed)
 CAD/aortic stenosis mild,  hx of CABG, dc'd Plavix, continue ASA, Atorvastatin, Isosorbide.

## 2024-03-10 NOTE — Assessment & Plan Note (Signed)
 chronic, minimal

## 2024-03-10 NOTE — Assessment & Plan Note (Signed)
 Severe spinal cord compression at C5-C6 with multi level spondylosis, T3 fx, contributory for falls, evaluated by Neurosurgery-not a surgical candidate   The left knee pain/weakness/numbness, bruise(Xray L tibia/fibula negative fx), skin tear left elbow-healing, hematoma left forehead sustained from fall 02/26/24(CT head/cervical spine unremarkable)-healing. ED eval 02/27/24.   Continue therapy, SNF for care

## 2024-03-10 NOTE — Assessment & Plan Note (Signed)
 increased urinary frequency, incontinence, but denied dysuria, she is afebrile.

## 2024-03-10 NOTE — Assessment & Plan Note (Addendum)
 K 3.8 03/07/24

## 2024-03-10 NOTE — Assessment & Plan Note (Signed)
 LDL 84 01/06/24, on Atorvastatin.

## 2024-03-10 NOTE — Assessment & Plan Note (Addendum)
 taking Metoprolol, Amlodipine, Bun/creat 25/1.08 03/07/24, intermittent mild elevated Sbp in 150s

## 2024-03-10 NOTE — Assessment & Plan Note (Signed)
 Hgb A1c 6.5 01/06/24, diet controlled.

## 2024-03-10 NOTE — Assessment & Plan Note (Signed)
 taking Estrace vaginal cream.

## 2024-03-10 NOTE — Assessment & Plan Note (Signed)
 on Vit D

## 2024-03-14 ENCOUNTER — Encounter: Payer: Self-pay | Admitting: Nurse Practitioner

## 2024-03-14 ENCOUNTER — Non-Acute Institutional Stay: Payer: Self-pay | Admitting: Nurse Practitioner

## 2024-03-14 DIAGNOSIS — I2510 Atherosclerotic heart disease of native coronary artery without angina pectoris: Secondary | ICD-10-CM

## 2024-03-14 DIAGNOSIS — R609 Edema, unspecified: Secondary | ICD-10-CM

## 2024-03-14 DIAGNOSIS — D62 Acute posthemorrhagic anemia: Secondary | ICD-10-CM

## 2024-03-14 DIAGNOSIS — E039 Hypothyroidism, unspecified: Secondary | ICD-10-CM

## 2024-03-14 DIAGNOSIS — E119 Type 2 diabetes mellitus without complications: Secondary | ICD-10-CM

## 2024-03-14 DIAGNOSIS — F32A Depression, unspecified: Secondary | ICD-10-CM

## 2024-03-14 DIAGNOSIS — R350 Frequency of micturition: Secondary | ICD-10-CM | POA: Diagnosis not present

## 2024-03-14 DIAGNOSIS — I10 Essential (primary) hypertension: Secondary | ICD-10-CM

## 2024-03-14 DIAGNOSIS — E785 Hyperlipidemia, unspecified: Secondary | ICD-10-CM | POA: Diagnosis not present

## 2024-03-14 DIAGNOSIS — M1990 Unspecified osteoarthritis, unspecified site: Secondary | ICD-10-CM

## 2024-03-14 LAB — BASIC METABOLIC PANEL
BUN: 18 (ref 4–21)
CO2: 26 — AB (ref 13–22)
Chloride: 104 (ref 99–108)
Creatinine: 0.7 (ref 0.5–1.1)
Glucose: 138
Potassium: 4.5 meq/L (ref 3.5–5.1)
Sodium: 137 (ref 137–147)

## 2024-03-14 LAB — CBC AND DIFFERENTIAL
HCT: 28 — AB (ref 36–46)
Hemoglobin: 9 — AB (ref 12.0–16.0)
Platelets: 241 10*3/uL (ref 150–400)
WBC: 6

## 2024-03-14 LAB — COMPREHENSIVE METABOLIC PANEL
Albumin: 3.2 — AB (ref 3.5–5.0)
Calcium: 8.5 — AB (ref 8.7–10.7)
Globulin: 3.2
eGFR: 76

## 2024-03-14 LAB — HEPATIC FUNCTION PANEL
ALT: 23 U/L (ref 7–35)
AST: 37 — AB (ref 13–35)
Alkaline Phosphatase: 219 — AB (ref 25–125)
Bilirubin, Total: 0.6

## 2024-03-14 LAB — CBC: RBC: 2.84 — AB (ref 3.87–5.11)

## 2024-03-14 NOTE — Assessment & Plan Note (Signed)
 Hgb A1c 6.5 01/06/24, diet controlled.

## 2024-03-14 NOTE — Assessment & Plan Note (Signed)
 increased urinary frequency, incontinence, but denied dysuria, she is afebrile.

## 2024-03-14 NOTE — Assessment & Plan Note (Signed)
 LDL 84 01/06/24, on Atorvastatin.

## 2024-03-14 NOTE — Assessment & Plan Note (Signed)
 Intermittent elevated Sbp, taking Metoprolol, Amlodipine, Bun/creat 25/1.08 03/07/24

## 2024-03-14 NOTE — Assessment & Plan Note (Signed)
 Chronic, LLE>RLL, mild.

## 2024-03-14 NOTE — Assessment & Plan Note (Addendum)
 the nurse reported the patient's pain in left ankle with swelling making it hard to walk, not getting worse for 2-3 weeks, R lower hip pain comes and goes. The patient stated its weakness in her left leg to keep her from walking, not pain. She denied R hip pain, stated her right lower back pain has been persisted, Norco is effective. The left leg muscle strength 5/5, currently working with therapy. 03/03/24 x-ray left ankle, 02/27/24 xray L tibia/fibula showed no fx of dislocation.    Hospitalized 03/03/24-03/07/24 for generalized weakness/LLE pain and weakness/numbness,  UTI-completed 4 more days of Cefdinir in SNF, T3 fx,  f/u neurosurgeon             Severe spinal cord compression at C5-C6 with multi level spondylosis, T3 fx, contributory for falls, evaluated by Neurosurgery-not a surgical candidate             LLE weakness, not pain but some numbness.   HPOA declined neurosurgery consultation.

## 2024-03-14 NOTE — Assessment & Plan Note (Signed)
 CAD/aortic stenosis mild,  hx of CABG, dc'd Plavix, continue ASA, Atorvastatin, Isosorbide.

## 2024-03-14 NOTE — Assessment & Plan Note (Signed)
 TSH 6.214 03/07/24, on Levothyroxine

## 2024-03-14 NOTE — Progress Notes (Unsigned)
 Location:   [facility] Room Number: 30 Place of Service:  SNF (31) Provider: Arna Snipe Essa Wenk NP  Tien Spooner X, NP  Patient Care Team: Aayush Gelpi X, NP as PCP - General (Internal Medicine) Runell Gess, MD as PCP - Cardiology (Cardiology) Janalyn Harder, MD (Inactive) as Consulting Physician (Dermatology)  Extended Emergency Contact Information Primary Emergency Contact: [name],[name] Address: [address]          [place] [address] [place] Home Phone: [phone] Mobile Phone: [phone] Relation: Daughter Secondary Emergency Contact: [name] [place] Home Phone: [phone] Mobile Phone: [phone] Relation: Daughter  Code Status: DNR Goals of care: Advanced Directive information    03/03/2024    1:55 PM  Advanced Directives  Does Patient Have a Medical Advance Directive? No  Would patient like information on creating a medical advance directive? No - Patient declined     Chief Complaint  Patient presents with  . Acute Visit    Reported the patient's left ankle pain.     HPI:  Pt is a 89 y.o. female seen today for an acute visit for the nurse reported the patient's pain in left ankle with swelling making it hard to walk, not getting worse for 2-3 weeks, R lower hip pain comes and goes. The patient stated its weakness in her left leg to keep her from walking, not pain. She denied R hip pain, stated her right lower back pain has been persisted, Norco is effective. The left leg muscle strength 5/5, currently working with therapy. 03/03/24 x-ray left ankle, 02/27/24 xray L tibia/fibula showed no fx of dislocation.    Hospitalized 03/03/24-03/07/24 for generalized weakness/LLE pain and weakness/numbness,  UTI-completed 4 more days of Cefdinir in SNF, T3 fx,  f/u neurosurgeon             Severe spinal cord compression at C5-C6 with multi level spondylosis, T3 fx, contributory for falls, evaluated by  Neurosurgery-not a surgical candidate             LLE weakness, not pain but some numbness.              Hyponatremia, 133<<135 03/07/24              The left knee pain/weakness/numbness, bruise(Xray L tibia/fibula negative fx), skin tear left elbow-healing, hematoma left forehead sustained from fall 02/26/24(CT head/cervical spine unremarkable)-healing. ED eval 02/27/24.              Negative for fx or displacement  X-ray pelvis 12/07/23, lower back pain is managed on Norco-better.              CT cervical spine 02/27/24, Age indeterminate superior endplate compression fracture of T3. Pending HPOA decision: f/u neurosurgery.               HTN, taking Metoprolol, Amlodipine, Bun/creat 25/1.08 03/07/24             CAD/aortic stenosis mild,  hx of CABG, dc'd Plavix, continue ASA, Atorvastatin, Isosorbide.              Vit D deficiency, on Vit D             Atrophic vaginitis, taking Estrace vaginal cream.              Anemia, Iron 25, Vit B12 325 01/25/24, placed on Fe, Hgb 9.0 03/07/24, gluteal hematoma, s/p transfused,  avoid plavix  Hypokalemia, K 3.8 03/07/24             T2DM, Hgb A1c 6.5 01/06/24, diet controlled.              Edema BLE, chronic, minimal, same weight 02/2024=08/2023 Hypothyroidism, TSH 6.214 03/07/24, on Levothyroxine HLD LDL 84 01/06/24, on Atorvastatin.              Urinary frequency, increased urinary frequency, incontinence, but denied dysuria, she is afebrile.     Past Medical History:  Diagnosis Date  . BCC (basal cell carcinoma of skin) 01/09/2009   Lower Central Back (tx p bx)  . CAD (coronary artery disease)    possible ant wall MI ('97) - cath & CABG x5  . Exogenous obesity   . GERD (gastroesophageal reflux disease)   . History of nuclear stress test 09/2012   lexiscan; mild perfusion defect in apical anterior & apical region (infarct/scar) - no significant ischemia, low risk   . History of total bilateral knee replacement   . Hypertension   . Hypothyroidism   .  Nodular basal cell carcinoma (BCC) 08/17/2017   Left Calf (tx p bx)  . PVC's (premature ventricular contractions)   . SCC (squamous cell carcinoma) [place] 01/29/2004   Right Forearm (Cx3,5FU)  . SCCA (squamous cell carcinoma) of skin 03/13/2005   Mid Upper Back  . SCCA (squamous cell carcinoma) of skin 09/07/2005   Left Elbow(Cx3,5FU)  . SCCA (squamous cell carcinoma) of skin 01/25/2006   Left Brow(in situ) (Cx3,5FU)  . SCCA (squamous cell carcinoma) of skin 04/27/2006   Inner Left Shoulder(Keratoacanthoma) (Exc.)  . SCCA (squamous cell carcinoma) of skin 03/22/2007   Left Temple(in situ) and Bridge of Nose(in situ) (Cs3,5FU)  . SCCA (squamous cell carcinoma) of skin 05/12/2007   Top of Scalp(Cx3,5FU)  . SCCA (squamous cell carcinoma) of skin 01/28/2011   Right Upper Arm(in situ)  . SCCA (squamous cell carcinoma) of skin 07/26/2012   Glabella, Inferior Tip (Cx3,5FU)  . SCCA (squamous cell carcinoma) of skin 03/08/2013   Left Front Scalp(in situ) and Upper Nose(in situ) (Cx3,5FU)  . SCCA (squamous cell carcinoma) of skin 07/24/2013   Right Lower Leg(Keratoacanthoma)  . SCCA (squamous cell carcinoma) of skin 09/11/2015   Left Scalp(in situ) (Cx3,5FU)  . SCCA (squamous cell carcinoma) of skin 01/15/2016   Left Forearm(in situ) and Left Temple(in situ) (tx p bx)  . SCCA (squamous cell carcinoma) of skin 10/01/2016   Left Mid Forearm(in situ)(tx p bx) and Left Front Scalp(watch)  . SCCA (squamous cell carcinoma) of skin 11/12/2016   Left Temple(Keratoacanthoma) (watch)  . SCCA (squamous cell carcinoma) of skin 02/11/2017   Top Left Hand(Keratoacanthoma) (tx p bx)  . Squamous cell carcinoma in situ (SCCIS) 11/18/1999   Above Left Outer Eyebrow  . Squamous cell carcinoma of scalp    removed - Dr. Jorja Loa  . Superficial basal cell carcinoma (BCC) 07/14/2004   Left Scapula (Cx3,5FU)   Past Surgical History:  Procedure Laterality Date  . ABDOMINAL HYSTERECTOMY  1984  . APPENDECTOMY   1941  . Carotid Doppler  12/07/2012   R & L ICA - 0-49% diameter reduction  . CORONARY ARTERY BYPASS GRAFT  09/1996   cath & CABG x5 LIMA-LAD, SVG-sequential OD & OM, SVG sequential to PDA & PLA (Dr. Donata Clay)  . REPLACEMENT TOTAL KNEE Bilateral 2001 & 2003  . TONSILLECTOMY    . TRANSTHORACIC ECHOCARDIOGRAM  08/23/2013   EF 50-55%, LV cavity size mod reduced, mild LVH,  mild conc hypertrophy; mild AV stenosis & mild regurg; mild MR - ordered for murmur    Allergies  Allergen Reactions  . Mobic [Meloxicam] Other (See Comments)    Unknown reaction  . Novocain [Procaine] Other (See Comments)    Unknown reaction    Allergies as of 03/14/2024       Reactions   Mobic [meloxicam] Other (See Comments)   Unknown reaction   Novocain [procaine] Other (See Comments)   Unknown reaction        Medication List        Accurate as of March 14, 2024  3:54 PM. If you have any questions, ask your nurse or doctor.          acetaminophen 325 MG tablet Commonly known as: TYLENOL Take 650 mg by mouth every 6 (six) hours as needed.   amLODipine 5 MG tablet Commonly known as: NORVASC Take 5 mg by mouth daily.   Aspirin 81 MG Caps Take 81 mg by mouth daily.   atorvastatin 40 MG tablet Commonly known as: LIPITOR TAKE 1 TABLET BY MOUTH EVERY DAY   cholecalciferol 25 MCG (1000 UNIT) tablet Commonly known as: VITAMIN D3 Take 1,000 Units by mouth daily.   diclofenac Sodium 1 % Gel Commonly known as: VOLTAREN Apply 2 g topically 4 (four) times daily.   estradiol 0.1 MG/GM vaginal cream Commonly known as: ESTRACE Place 1 Applicatorful vaginally See admin instructions. Apply 1g vaginally in the evening on Monday, Wednesday, Friday.   ferrous sulfate 325 (65 FE) MG EC tablet Take 325 mg by mouth every Monday, Wednesday, and Friday.   HYDROcodone-acetaminophen 5-325 MG tablet Commonly known as: NORCO/VICODIN Take 0.5 tablets by mouth every 6 (six) hours as needed for moderate pain  (pain score 4-6).   hydrocortisone 2.5 % cream Apply 1 Application topically at bedtime.   levothyroxine 75 MCG tablet Commonly known as: SYNTHROID Take 75 mcg by mouth daily before breakfast.   lidocaine 4 % Place 1 patch onto the skin daily. Apply patch at 0800 and remove patch at at 2000   methocarbamol 500 MG tablet Commonly known as: ROBAXIN Take 1 tablet (500 mg total) by mouth every 8 (eight) hours as needed for muscle spasms.   metoprolol succinate 50 MG 24 hr tablet Commonly known as: TOPROL-XL Take 1 tablet (50 mg total) by mouth daily. Take with or immediately following a meal.   multivitamin with minerals Tabs tablet Take 1 tablet by mouth daily.   mupirocin ointment 2 % Commonly known as: BACTROBAN Apply 1 Application topically daily as needed (irritation).   nitroGLYCERIN 0.4 MG SL tablet Commonly known as: NITROSTAT Place 0.4 mg under the tongue every 5 (five) minutes as needed for chest pain.   polyethylene glycol 17 g packet Commonly known as: MIRALAX / GLYCOLAX Take 17 g by mouth See admin instructions. Every Mon, Wed, Friday for constipation. Mix with 4 ounces of fluid.   potassium chloride 10 MEQ tablet Commonly known as: KLOR-CON Take 10 mEq by mouth daily as needed.        Review of Systems  Constitutional:  Negative for appetite change, fatigue and fever.  HENT:  Positive for hearing loss. Negative for congestion and trouble swallowing.   Eyes:  Negative for visual disturbance.  Respiratory:  Negative for cough, chest tightness and wheezing.   Cardiovascular:  Positive for leg swelling.  Gastrointestinal:  Negative for abdominal pain and constipation.  Genitourinary:  Positive for frequency and urgency. Negative for dysuria.  Incontinent of urine.   Musculoskeletal:  Positive for arthralgias, back pain and gait problem.       R lower back pain, R+L knee pain  Skin:  Negative for color change.  Neurological:  Positive for weakness and  numbness. Negative for speech difficulty and headaches.       Lower legs weakness L>R  Psychiatric/Behavioral:  Negative for confusion and sleep disturbance. The patient is not nervous/anxious.     Immunization History  Administered Date(s) Administered  . Fluad Quad(high Dose 65+) 08/30/2019  . Influenza, High Dose Seasonal PF 10/10/2018, 09/29/2023  . Influenza,inj,quad, With Preservative 08/30/2019  . Pneumococcal Conjugate-13 01/28/2013  . Td (Adult) 09/27/2022  . Unspecified SARS-COV-2 Vaccination 01/01/2020, 09/16/2021   Pertinent  Health Maintenance Due  Topic Date Due  . FOOT EXAM  Never done  . OPHTHALMOLOGY EXAM  Never done  . HEMOGLOBIN A1C  07/05/2024  . INFLUENZA VACCINE  Completed  . DEXA SCAN  Completed      07/09/2021   10:00 PM 07/10/2021    8:53 AM 07/10/2021    9:00 PM 07/11/2021    8:40 AM 07/21/2021   10:02 AM  Fall Risk  (RETIRED) Patient Fall Risk Level Moderate fall risk Moderate fall risk Moderate fall risk Moderate fall risk Low fall risk   Functional Status Survey:    Vitals:   03/14/24 1220  Pulse: 72  Resp: 20  Temp: (!) 97.5 F (36.4 C)  SpO2: 95%  Weight: 162 lb 12.8 oz (73.8 kg)   Body mass index is 26.28 kg/m. Physical Exam Vitals and nursing note reviewed.  Constitutional:      Appearance: Normal appearance.  HENT:     Head: Normocephalic and atraumatic.     Nose: Nose normal.     Mouth/Throat:     Mouth: Mucous membranes are moist.  Eyes:     Extraocular Movements: Extraocular movements intact.     Conjunctiva/sclera: Conjunctivae normal.     Pupils: Pupils are equal, round, and reactive to light.  Cardiovascular:     Rate and Rhythm: Normal rate and regular rhythm.     Heart sounds: Murmur heard.  Pulmonary:     Effort: Pulmonary effort is normal.     Breath sounds: No rales.  Abdominal:     General: Bowel sounds are normal.     Palpations: Abdomen is soft.     Tenderness: There is no abdominal tenderness.   Musculoskeletal:        General: Tenderness present.     Cervical back: Normal range of motion and neck supple.     Right lower leg: Edema present.     Left lower leg: Edema present.     Comments: R SIJ tenderness palpated, with movement, better on heating pad Trace edema BLE knee pain R+L Numbness in feet, mostly left, not pain, able to move, muscle strength 5/5 LLE and RLE Weakness in BLE L>R unable to walk  Skin:    General: Skin is warm and dry.     Findings: Bruising present.     Comments: Healing the left forehead hematoma, left orbital ecchymoses, left knee pain/bruise, left elbow skin tear  Neurological:     General: No focal deficit present.     Mental Status: She is alert and oriented to person, place, and time. Mental status is at baseline.     Motor: Weakness present.     Coordination: Coordination normal.     Gait: Gait abnormal.  Comments: Weakness BLE L>R, muscle strength 5/5  Psychiatric:        Mood and Affect: Mood normal.        Behavior: Behavior normal.        Thought Content: Thought content normal.        Judgment: Judgment normal.    Labs reviewed: Recent Labs    03/05/24 1817 03/06/24 0927 03/07/24 0612  NA 137 138 135  K 4.0 3.8 3.8  CL 106 109 110  CO2 17* 22 21*  GLUCOSE 143* 137* 101*  BUN 27* 21 25*  CREATININE 1.09* 1.27* 1.08*  CALCIUM 8.7* 8.5* 8.0*  MG 1.9 1.8 1.8  PHOS 4.1 4.0 3.7   Recent Labs    03/05/24 1817 03/06/24 0927 03/07/24 0612  AST 39 30 26  ALT 26 24 23   ALKPHOS 165* 165* 162*  BILITOT 0.8 0.8 0.6  PROT 6.3* 6.2* 5.7*  ALBUMIN 2.4* 2.3* 2.3*   Recent Labs    03/05/24 1817 03/06/24 0927 03/07/24 0612  WBC 7.7 6.5 5.9  NEUTROABS 3.9 3.6 3.0  HGB 9.6* 9.2* 9.0*  HCT 30.3* 29.1* 27.9*  MCV 101.7* 99.3 100.0  PLT 233 253 233   Lab Results  Component Value Date   TSH 6.214 (H) 03/07/2024   Lab Results  Component Value Date   HGBA1C 6.5 01/06/2024   Lab Results  Component Value Date   CHOL 151  01/06/2024   HDL 48 01/06/2024   LDLCALC 84 01/06/2024   TRIG 101 01/06/2024   CHOLHDL 3.0 07/09/2021    Significant Diagnostic Results in last 30 days:  MR Cervical Spine W and Wo Contrast Result Date: 03/04/2024 CLINICAL DATA:  89 year old female status post fall. Age indeterminate T3 compression fracture and nonspecific epidural gas within the cervical spine by CT. EXAM: MRI CERVICAL SPINE WITHOUT AND WITH CONTRAST TECHNIQUE: Multiplanar and multiecho pulse sequences of the cervical spine, to include the craniocervical junction and cervicothoracic junction, were obtained without and with intravenous contrast. CONTRAST:  7mL GADAVIST GADOBUTROL 1 MMOL/ML IV SOLN COMPARISON:  CT cervical spine 02/27/2024, thoracic spine 03/03/2024. FINDINGS: Intermittently motion degraded exam, especially most of the sagittal cervical imaging. Alignment: Multilevel degenerative appearing spondylolisthesis is stable, worst at C5-C6 (3-4 mm there). Vertebrae: Normal background bone marrow signal. T3 mild superior endplate compression appears chronic and No marrow edema or evidence of acute osseous abnormality. Cord: Degenerative appearing spinal cord compression at C5-C6, up to moderate (series 6, image 10). Limited spinal cord detail on this exam but no obvious spinal cord signal changes. C5 and C6 have been the epicenter of the abnormal spinal canal gas on previous CTs. See additional details below. On postcontrast axial images there is mild abnormal enhancement within the spinal canal at C5-C6, primarily in the posterior midline which appears superimposed on a small volume of gas series 14, image 32) and medial to chronically degenerated facets there (series 14, image 33) where there is rim enhancement of un intracanalicular space-occupying lesion which is 5-6 mm in thickness. Contralateral right C5-C6 hyperdense canal space-occupying mass by CT appears to be low-density on MRI best seen on series 9, image 29,  nonenhancing. There is no generalized dural thickening or enhancement. And no abnormal intradural enhancement identified. Posterior Fossa, vertebral arteries, paraspinal tissues: Mild degenerative mass effect at the cervicomedullary junction. No cervicomedullary, visible brainstem or cerebellar signal abnormality or abnormal enhancement. Preserved major vascular flow voids in the neck. No cervical paraspinal soft tissue inflammation or mass identified. Negative visible  right lung apex. Disc levels: Advanced cervical spine degeneration most notable for C1-C2: Bulky ligamentous hypertrophy about the odontoid which is partially calcified by CT, superimposed on bulky C1-C2 degeneration. And there is also bulky partially calcified ligament flavum hypertrophy at that level. Combined there is C1-C2 level spinal stenosis with up to mild spinal cord mass effect (series 6 image 7). C5-C6: Spondylolisthesis of 3, up to 4 mm associated with severe bilateral facet arthropathy, bilateral posterior element hypertrophy including apparent ligamentous hypertrophy which by CT is hyperdense and possibly calcified, low signal on this exam. Abnormal mostly midline posterior epidural space gas and left side rim calcified space-occupying lesion as seen on series 14, image 33. Moderate to severe spinal stenosis and spinal cord mass effect (series 9, image 33 and series 6, image 10). Bilateral C6 foraminal stenosis is moderate to severe, worse on the right. C6-C7 and C7-T1 less pronounced spondylolisthesis. No significant spinal stenosis at those levels. Bilateral foraminal stenosis. IMPRESSION: 1. Highly abnormal cervical spine C5-C6 level. Suboptimal MRI resolution but the constellation of CT and MRI findings favors advanced degenerative etiology - such as synovial cyst(s) and/or ligament flavum hypertrophy with partial calcification, associated vacuum phenomena - superimposed on spondylolisthesis and severe facet arthropathy. No strong  evidence of spinal infection or malignancy. 2. High-grade spinal stenosis associated with #1 with cord compression there, but no spinal cord signal abnormality identified. 3. Other advanced cervical spine degeneration, most notably C1-C2 where bulky and partially calcified ligamentous hypertrophy both ventral and dorsal to the spinal cord. Spinal stenosis with less pronounced C1-C2 cord mass effect. 4. Chronic T3 superior endplate compression. No marrow edema or acute osseous abnormality identified. Electronically Signed   By: Odessa Fleming M.D.   On: 03/04/2024 04:49   CT ABDOMEN PELVIS W CONTRAST Result Date: 03/03/2024 CLINICAL DATA:  Abdominal trauma, blunt.  Multiple falls. EXAM: CT ABDOMEN AND PELVIS WITH CONTRAST TECHNIQUE: Multidetector CT imaging of the abdomen and pelvis was performed using the standard protocol following bolus administration of intravenous contrast. RADIATION DOSE REDUCTION: This exam was performed according to the departmental dose-optimization program which includes automated exposure control, adjustment of the mA and/or kV according to patient size and/or use of iterative reconstruction technique. CONTRAST:  OMNIPAQUE IOHEXOL 300 MG/ML  SOLN COMPARISON:  09/20/2019. FINDINGS: Lower chest: The heart is enlarged and there is no pericardial effusion. Multi-vessel coronary artery calcifications are noted there is a trace left pleural effusion. Atelectasis is present at the lung bases bilaterally. Hepatobiliary: No focal liver abnormality is seen. No gallstones, gallbladder wall thickening, or biliary dilatation. Pancreas: Unremarkable. No pancreatic ductal dilatation or surrounding inflammatory changes. Spleen: Normal in size without focal abnormality. Adrenals/Urinary Tract: The adrenal glands are within normal limits. The kidneys enhance symmetrically. Renal atrophy is noted on the left. There are scattered hypodensities in the kidneys bilaterally which are too small to further  characterize. A hyperdense lesion is noted in the mid left kidney measuring 1.2 cm with attenuation of 103 Hounsfield units, likely proteinaceous or hemorrhagic cyst. No renal calculus or hydronephrosis bilaterally. There is mild bladder wall thickening. Stomach/Bowel: Stomach is within normal limits. Appendix is not seen. No evidence of bowel wall thickening, distention, or inflammatory changes. No free air or pneumatosis. Scattered diverticula are present along the colon without evidence of diverticulitis. Vascular/Lymphatic: Aortic atherosclerosis. No enlarged abdominal or pelvic lymph nodes. Reproductive: Status post hysterectomy. No adnexal masses. Other: No abdominopelvic ascites. A fat containing umbilical hernia is noted. Musculoskeletal: Scattered densities and subcutaneous fat  stranding is noted over the left lateral hip, the largest measuring 4.3 x 2.6 x 4.8 cm, compatible with hematoma. Hyperdense attenuation is noted in the distal gluteal muscle on the left. Degenerative changes are present in the thoracolumbar spine and bilateral hips. Sternotomy wires are noted. IMPRESSION: 1. Subcutaneous hematoma and contusions over the left hip and likely small hematoma in the distal gluteal muscle on the left. No acute fracture is seen. 2. Trace left pleural effusion with atelectasis at the lung bases. 3. Diverticulosis without diverticulitis. 4. Aortic atherosclerosis. Electronically Signed   By: Thornell Sartorius M.D.   On: 03/03/2024 22:30   CT Thoracic Spine Wo Contrast Result Date: 03/03/2024 CLINICAL DATA:  Fall EXAM: CT THORACIC SPINE WITHOUT CONTRAST TECHNIQUE: Multidetector CT images of the thoracic were obtained using the standard protocol without intravenous contrast. RADIATION DOSE REDUCTION: This exam was performed according to the departmental dose-optimization program which includes automated exposure control, adjustment of the mA and/or kV according to patient size and/or use of iterative  reconstruction technique. COMPARISON:  None Available. FINDINGS: Alignment: Normal. Vertebrae: There is a minimally depressed wedge compression fracture of T3. No other acute osseous abnormality. Vertebral body heights are otherwise maintained. Paraspinal and other soft tissues: There is small volume pneumorachis of the lower cervical spine, similar to 02/27/2024. Disc levels: There is no spinal canal stenosis. IMPRESSION: 1. Minimally depressed wedge compression fracture of T3. 2. Small amount of epidural gas with adjacent hyperdensity within the lower cervical spine, unchanged since 02/27/2024. While this is somewhat nonspecific, epidural infection can cause this appearance. MRI with and without contrast of the cervical spine is recommended. Electronically Signed   By: Deatra Robinson M.D.   On: 03/03/2024 22:12   DG Lumbar Spine Complete Result Date: 03/03/2024 CLINICAL DATA:  Pain after fall. EXAM: LUMBAR SPINE - COMPLETE 4+ VIEW COMPARISON:  None Available. FINDINGS: Lateral views are limited by positioning. Lower aspect of the lumbar spine is not included in the field of view. Five non-rib-bearing lumbar vertebra. Dextroscoliotic curvature. Trace retrolisthesis at tentatively L2-L3. No evidence of acute fracture or compression deformity. There is multilevel degenerative disc disease and facet hypertrophy. The sacroiliac joints are congruent. IMPRESSION: 1. Technically limited exam. Allowing for this, no acute fracture or subluxation of the lumbar spine. 2. Dextroscoliotic curvature with multilevel degenerative disc disease and facet hypertrophy. Electronically Signed   By: Narda Rutherford M.D.   On: 03/03/2024 18:42   DG Ankle Complete Left Result Date: 03/03/2024 CLINICAL DATA:  Pain after fall. EXAM: LEFT ANKLE COMPLETE - 3+ VIEW COMPARISON:  Included portion from tibia/fibular radiographs 02/27/2024 FINDINGS: There is no evidence of fracture, dislocation, or joint effusion. Ankle mortise is preserved.  Mild chronic spurring of the medial and lateral malleoli. Plantar calcaneal spur and Achilles tendon enthesophyte. Vascular calcifications are seen. IMPRESSION: 1. No fracture or subluxation of the left ankle. 2. Plantar calcaneal spur and Achilles tendon enthesophyte. Electronically Signed   By: Narda Rutherford M.D.   On: 03/03/2024 18:41   CT Maxillofacial Wo Contrast Result Date: 03/03/2024 CLINICAL DATA:  Facial trauma, blunt.  Multiple falls. EXAM: CT MAXILLOFACIAL WITHOUT CONTRAST TECHNIQUE: Multidetector CT imaging of the maxillofacial structures was performed. Multiplanar CT image reconstructions were also generated. RADIATION DOSE REDUCTION: This exam was performed according to the departmental dose-optimization program which includes automated exposure control, adjustment of the mA and/or kV according to patient size and/or use of iterative reconstruction technique. COMPARISON:  CT head without contrast 02/27/2024. FINDINGS: Osseous: No acute fractures  are present. Mandible is intact and located. Prominent soft tissue pannus is present about the dens with some erosive changes consistent with rheumatoid arthritis. No acute fractures are present. Orbits: Bilateral lens replacements are noted. Globes and orbits are otherwise unremarkable. Sinuses: The paranasal sinuses and mastoid air cells are clear. Soft tissues: The left supraorbital scalp hematoma seen previously is near completely resolved. No underlying fracture is present. No new soft tissue injury is present. Limited intracranial: Within normal limits for age.  Stable. IMPRESSION: 1. No acute fracture or acute injury to the facial bones. 2. Near complete resolution of left supraorbital scalp hematoma seen previously. 3. Prominent soft tissue pannus about the dens with some erosive changes consistent with rheumatoid arthritis. Electronically Signed   By: Marin Roberts M.D.   On: 03/03/2024 17:25   DG Tibia/Fibula Left Result Date:  02/27/2024 CLINICAL DATA:  Recent fall with left leg pain, initial encounter EXAM: LEFT TIBIA AND FIBULA - 2 VIEW COMPARISON:  None Available. FINDINGS: Left knee joint replacement is seen. Vascular calcifications are identified. No acute fracture or dislocation is noted. No soft tissue abnormality is seen. IMPRESSION: No acute abnormality noted. Electronically Signed   By: Alcide Clever M.D.   On: 02/27/2024 03:48   CT Head Wo Contrast Result Date: 02/27/2024 CLINICAL DATA:  Level 2 fall, on blood thinners, hematoma to left forehead EXAM: CT HEAD WITHOUT CONTRAST CT CERVICAL SPINE WITHOUT CONTRAST TECHNIQUE: Multidetector CT imaging of the head and cervical spine was performed following the standard protocol without intravenous contrast. Multiplanar CT image reconstructions of the cervical spine were also generated. RADIATION DOSE REDUCTION: This exam was performed according to the departmental dose-optimization program which includes automated exposure control, adjustment of the mA and/or kV according to patient size and/or use of iterative reconstruction technique. COMPARISON:  CT head and C-spine 12/07/2023 FINDINGS: CT HEAD FINDINGS Brain: No intracranial hemorrhage, mass effect, or evidence of acute infarct. No hydrocephalus. 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 Age-commensurate cerebral atrophy and chronic small vessel ischemic disease. Vascular: No hyperdense vessel. Intracranial arterial calcification. Skull: No fracture or focal lesion.  Left forehead hematoma. Sinuses/Orbits: No acute finding. Other: None. CT CERVICAL SPINE FINDINGS Alignment: No evidence of traumatic malalignment. Chronic subluxations of multiple vertebral bodies are unchanged. Skull base and vertebrae: No acute fracture in the cervical spine. Age indeterminate superior endplate compression fracture of T3, new since 12/07/2023. There is less than 10% vertebral body height loss. No  retropulsion. Sclerosis in the superior endplate of T3 is new compared to 12/07/2023. Soft tissues and spinal canal: No prevertebral fluid or swelling. No visible canal hematoma. Disc levels: Degenerative pannus formation about the atlantoaxial joint is unchanged. Multilevel spondylosis and facet arthropathy is unchanged. No severe spinal canal narrowing. Upper chest: No acute abnormality. Other: Carotid calcification. IMPRESSION: 1. No acute intracranial abnormality. Left forehead hematoma. 2. No acute fracture in the cervical spine. 3. Age indeterminate superior endplate compression fracture of T3. There is less than 10% vertebral body height loss. No retropulsion. 4. 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 Electronically Signed   By: Minerva Fester M.D.   On: 02/27/2024 02:32   CT Cervical Spine Wo Contrast Result Date: 02/27/2024 CLINICAL DATA:  Level 2 fall, on blood thinners, hematoma to left forehead EXAM: CT HEAD WITHOUT CONTRAST CT CERVICAL SPINE WITHOUT CONTRAST TECHNIQUE: Multidetector CT imaging of the head and cervical spine was performed following the standard protocol without intravenous contrast. Multiplanar CT  image reconstructions of the cervical spine were also generated. RADIATION DOSE REDUCTION: This exam was performed according to the departmental dose-optimization program which includes automated exposure control, adjustment of the mA and/or kV according to patient size and/or use of iterative reconstruction technique. COMPARISON:  CT head and C-spine 12/07/2023 FINDINGS: CT HEAD FINDINGS Brain: No intracranial hemorrhage, mass effect, or evidence of acute infarct. No hydrocephalus. 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 Age-commensurate cerebral atrophy and chronic small vessel ischemic disease. Vascular: No hyperdense vessel. Intracranial arterial calcification. Skull: No fracture or  focal lesion.  Left forehead hematoma. Sinuses/Orbits: No acute finding. Other: None. CT CERVICAL SPINE FINDINGS Alignment: No evidence of traumatic malalignment. Chronic subluxations of multiple vertebral bodies are unchanged. Skull base and vertebrae: No acute fracture in the cervical spine. Age indeterminate superior endplate compression fracture of T3, new since 12/07/2023. There is less than 10% vertebral body height loss. No retropulsion. Sclerosis in the superior endplate of T3 is new compared to 12/07/2023. Soft tissues and spinal canal: No prevertebral fluid or swelling. No visible canal hematoma. Disc levels: Degenerative pannus formation about the atlantoaxial joint is unchanged. Multilevel spondylosis and facet arthropathy is unchanged. No severe spinal canal narrowing. Upper chest: No acute abnormality. Other: Carotid calcification. IMPRESSION: 1. No acute intracranial abnormality. Left forehead hematoma. 2. No acute fracture in the cervical spine. 3. Age indeterminate superior endplate compression fracture of T3. There is less than 10% vertebral body height loss. No retropulsion. 4. 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 Electronically Signed   By: Minerva Fester M.D.   On: 02/27/2024 02:32   DG Pelvis Portable Result Date: 02/27/2024 CLINICAL DATA:  Level 2 trauma, fall, on blood thinners EXAM: PORTABLE PELVIS 1-2 VIEWS COMPARISON:  12/07/2023 FINDINGS: No acute fracture or dislocation. Degenerative changes pubic symphysis, both hips, SI joints and lower lumbar spine. IMPRESSION: No acute fracture or dislocation. Electronically Signed   By: Minerva Fester M.D.   On: 02/27/2024 02:04   DG Chest Portable 1 View Result Date: 02/27/2024 CLINICAL DATA:  Level 2 trauma, fall on blood thinners EXAM: PORTABLE CHEST 1 VIEW COMPARISON:  12/07/2023 FINDINGS: Sternotomy and CABG. Stable cardiomediastinal silhouette. Aortic atherosclerotic calcification.  Elevated right hemidiaphragm and right basilar atelectasis. Additional left basilar atelectasis. Otherwise no focal consolidation. No pleural effusion or pneumothorax. No displaced rib fractures. IMPRESSION: No change from 12/07/2023. Bibasilar atelectasis. Elevated right hemidiaphragm. Electronically Signed   By: Minerva Fester M.D.   On: 02/27/2024 02:03    Assessment/Plan: Osteoarthritis   the nurse reported the patient's pain in left ankle with swelling making it hard to walk, not getting worse for 2-3 weeks, R lower hip pain comes and goes. The patient stated its weakness in her left leg to keep her from walking, not pain. She denied R hip pain, stated her right lower back pain has been persisted, Norco is effective. The left leg muscle strength 5/5, currently working with therapy. 03/03/24 x-ray left ankle, 02/27/24 xray L tibia/fibula showed no fx of dislocation.    Hospitalized 03/03/24-03/07/24 for generalized weakness/LLE pain and weakness/numbness,  UTI-completed 4 more days of Cefdinir in SNF, T3 fx,  f/u neurosurgeon             Severe spinal cord compression at C5-C6 with multi level spondylosis, T3 fx, contributory for falls, evaluated by Neurosurgery-not a surgical candidate             LLE weakness, not pain but some numbness.  HTN (hypertension) Intermittent elevated Sbp, taking Metoprolol, Amlodipine, Bun/creat 25/1.08 03/07/24  CAD (coronary artery disease) CAD/aortic stenosis mild,  hx of CABG, dc'd Plavix, continue ASA, Atorvastatin, Isosorbide.   Acute on chronic blood loss anemia  Iron 25, Vit B12 325 01/25/24, placed on Fe, Hgb 9.0 03/07/24, gluteal hematoma, s/p transfused,  avoid plavix  Type 2 diabetes, diet controlled (HCC) Hgb A1c 6.5 01/06/24, diet controlled.   Edema Chronic, LLE>RLL, mild.   Hypothyroidism TSH 6.214 03/07/24, on Levothyroxine   Dyslipidemia LDL 84 01/06/24, on Atorvastatin  Urinary frequency  increased urinary frequency, incontinence, but denied  dysuria, she is afebrile.     Family/ staff Communication: plan of care reviewed with patient and charge nurse.   Labs/tests ordered:  none

## 2024-03-14 NOTE — Assessment & Plan Note (Signed)
 Iron 25, Vit B12 325 01/25/24, placed on Fe, Hgb 9.0 03/07/24, gluteal hematoma, s/p transfused,  avoid plavix

## 2024-03-15 DIAGNOSIS — F32A Depression, unspecified: Secondary | ICD-10-CM | POA: Insufficient documentation

## 2024-03-15 NOTE — Assessment & Plan Note (Signed)
 Situational, Cymbalta 20mg  every day. BMP one week

## 2024-03-16 ENCOUNTER — Encounter: Payer: Self-pay | Admitting: Sports Medicine

## 2024-03-16 ENCOUNTER — Encounter: Payer: Self-pay | Admitting: Nurse Practitioner

## 2024-03-16 ENCOUNTER — Non-Acute Institutional Stay (SKILLED_NURSING_FACILITY): Payer: Self-pay | Admitting: Sports Medicine

## 2024-03-16 DIAGNOSIS — M545 Low back pain, unspecified: Secondary | ICD-10-CM

## 2024-03-16 DIAGNOSIS — G8929 Other chronic pain: Secondary | ICD-10-CM | POA: Diagnosis not present

## 2024-03-16 DIAGNOSIS — Z951 Presence of aortocoronary bypass graft: Secondary | ICD-10-CM | POA: Diagnosis not present

## 2024-03-16 DIAGNOSIS — R609 Edema, unspecified: Secondary | ICD-10-CM | POA: Diagnosis not present

## 2024-03-16 DIAGNOSIS — D649 Anemia, unspecified: Secondary | ICD-10-CM | POA: Diagnosis not present

## 2024-03-16 DIAGNOSIS — I2510 Atherosclerotic heart disease of native coronary artery without angina pectoris: Secondary | ICD-10-CM

## 2024-03-16 DIAGNOSIS — I10 Essential (primary) hypertension: Secondary | ICD-10-CM

## 2024-03-16 DIAGNOSIS — R748 Abnormal levels of other serum enzymes: Secondary | ICD-10-CM | POA: Insufficient documentation

## 2024-03-16 NOTE — Progress Notes (Signed)
 Provider:  Dr. Venita Sheffield Location:  [name] Place of Service:   Skilled care   PCP: Mast, Man X, NP Patient Care Team: Mast, Man X, NP as PCP - General (Internal Medicine) Runell Gess, MD as PCP - Cardiology (Cardiology) Janalyn Harder, MD (Inactive) as Consulting Physician (Dermatology)  Extended Emergency Contact Information Primary Emergency Contact: [name],[name] Address: [address]          [place] [address] [place] Home Phone: [phone] Mobile Phone: [phone] Relation: Daughter Secondary Emergency Contact: [name] [place] of [place] Home Phone: [phone] Mobile Phone: [phone] Relation: Daughter  Goals of Care: Advanced Directive information    03/03/2024    1:55 PM  Advanced Directives  Does Patient Have a Medical Advance Directive? No  Would patient like information on creating a medical advance directive? No - Patient declined      No chief complaint on file.   HPI: Patient is a 89 y.o. female seen today for admission to Skilled care  Patient seen and examined in her room.  Daughter is available during the visit today. Patient sitting in a wheelchair.  Patient complains of pain in her lower back, constant with no radiation with numbness in her left foot. Patient states she is feeling numb in her leg intermittently. She is eager to go back to the independent living but currently she needs assistance with transferring. X-rays showed T3 fracture, spinal cord compression at C5-C6 with multilevel spondylosis. .  She is currently getting Tylenol 650 mg as needed, oxycodone as needed.  Cymbalta was started yesterday Patient denies lower abdominal pain, nausea, vomiting, dysuria, hematuria, bloody or dark-colored stools. Patient denies cough, runny nose, shortness of breath. She is able to speak in full sentences, does not appear to be in distress.     Past Medical History:   Diagnosis Date   BCC (basal cell carcinoma of skin) 01/09/2009   Lower Central Back (tx p bx)   CAD (coronary artery disease)    possible ant wall MI ('97) - cath & CABG x5   Exogenous obesity    GERD (gastroesophageal reflux disease)    History of nuclear stress test 09/2012   lexiscan; mild perfusion defect in apical anterior & apical region (infarct/scar) - no significant ischemia, low risk    History of total bilateral knee replacement    Hypertension    Hypothyroidism    Nodular basal cell carcinoma (BCC) 08/17/2017   Left Calf (tx p bx)   PVC's (premature ventricular contractions)    SCC (squamous cell carcinoma) [place] 01/29/2004   Right Forearm (Cx3,5FU)   SCCA (squamous cell carcinoma) of skin 03/13/2005   Mid Upper Back   SCCA (squamous cell carcinoma) of skin 09/07/2005   Left Elbow(Cx3,5FU)   SCCA (squamous cell carcinoma) of skin 01/25/2006   Left Brow(in situ) (Cx3,5FU)   SCCA (squamous cell carcinoma) of skin 04/27/2006   Inner Left Shoulder(Keratoacanthoma) (Exc.)   SCCA (squamous cell carcinoma) of skin 03/22/2007   Left Temple(in situ) and Bridge of Nose(in situ) (Cs3,5FU)   SCCA (squamous cell carcinoma) of skin 05/12/2007   Top of Scalp(Cx3,5FU)   SCCA (squamous cell carcinoma) of skin 01/28/2011   Right Upper Arm(in situ)   SCCA (squamous cell carcinoma) of skin 07/26/2012   Glabella, Inferior Tip (Cx3,5FU)   SCCA (squamous cell carcinoma) of skin 03/08/2013   Left Front Scalp(in situ) and Upper Nose(in situ) (Cx3,5FU)   SCCA (squamous cell carcinoma) of skin  07/24/2013   Right Lower Leg(Keratoacanthoma)   SCCA (squamous cell carcinoma) of skin 09/11/2015   Left Scalp(in situ) (Cx3,5FU)   SCCA (squamous cell carcinoma) of skin 01/15/2016   Left Forearm(in situ) and Left Temple(in situ) (tx p bx)   SCCA (squamous cell carcinoma) of skin 10/01/2016   Left Mid Forearm(in situ)(tx p bx) and Left Front Scalp(watch)   SCCA (squamous cell carcinoma) of skin  11/12/2016   Left Temple(Keratoacanthoma) (watch)   SCCA (squamous cell carcinoma) of skin 02/11/2017   Top Left Hand(Keratoacanthoma) (tx p bx)   Squamous cell carcinoma in situ (SCCIS) 11/18/1999   Above Left Outer Eyebrow   Squamous cell carcinoma of scalp    removed - Dr. Jorja Loa   Superficial basal cell carcinoma (BCC) 07/14/2004   Left Scapula (Cx3,5FU)   Past Surgical History:  Procedure Laterality Date   ABDOMINAL HYSTERECTOMY  1984   APPENDECTOMY  1941   Carotid Doppler  12/07/2012   R & L ICA - 0-49% diameter reduction   CORONARY ARTERY BYPASS GRAFT  09/1996   cath & CABG x5 LIMA-LAD, SVG-sequential OD & OM, SVG sequential to PDA & PLA (Dr. Donata Clay)   REPLACEMENT TOTAL KNEE Bilateral 2001 & 2003   TONSILLECTOMY     TRANSTHORACIC ECHOCARDIOGRAM  08/23/2013   EF 50-55%, LV cavity size mod reduced, mild LVH, mild conc hypertrophy; mild AV stenosis & mild regurg; mild MR - ordered for murmur    reports that she has never smoked. She has never used smokeless tobacco. She reports current alcohol use. No history on file for drug use. Social History   Socioeconomic History   Marital status: Widowed    Spouse name: Not on file   Number of children: 4   Years of education: 22   Highest education level: Not on file  Occupational History   Not on file  Tobacco Use   Smoking status: Never   Smokeless tobacco: Never  Vaping Use   Vaping status: Not on file  Substance and Sexual Activity   Alcohol use: Yes    Comment: "a drink of wine every now and then"   Drug use: Not on file   Sexual activity: Not on file  Other Topics Concern   Not on file  Social History Narrative   Not on file   Social Drivers of Health   Financial Resource Strain: Not on file  Food Insecurity: No Food Insecurity (03/04/2024)   Hunger Vital Sign    Worried About Running Out of Food in the Last Year: Never true    Ran Out of Food in the Last Year: Never true  Transportation Needs: No  Transportation Needs (03/04/2024)   PRAPARE - Administrator, Civil Service (Medical): No    Lack of Transportation (Non-Medical): No  Physical Activity: Not on file  Stress: Not on file  Social Connections: Socially Isolated (03/04/2024)   Social Connection and Isolation Panel [NHANES]    Frequency of Communication with Friends and Family: More than three times a week    Frequency of Social Gatherings with Friends and Family: More than three times a week    Attends Religious Services: Never    Database administrator or Organizations: No    Attends Banker Meetings: Never    Marital Status: Widowed  Intimate Partner Violence: Not At Risk (03/04/2024)   Humiliation, Afraid, Rape, and Kick questionnaire    Fear of Current or Ex-Partner: No    Emotionally  Abused: No    Physically Abused: No    Sexually Abused: No    Functional Status Survey:    Family History  Problem Relation Age of Onset   Heart attack Mother    Heart attack Father    Cancer Sister    Diabetes Sister    Heart Problems Sister     Health Maintenance  Topic Date Due   FOOT EXAM  Never done   OPHTHALMOLOGY EXAM  Never done   Zoster Vaccines- Shingrix (1 of 2) Never done   Pneumonia Vaccine 15+ Years old (2 of 2 - PPSV23 or PCV20) 03/25/2013   COVID-19 Vaccine (3 - Mixed Product risk series) 10/14/2021   DTaP/Tdap/Td (1 - Tdap) 09/28/2022   HEMOGLOBIN A1C  07/05/2024   Medicare Annual Wellness (AWV)  01/24/2025   INFLUENZA VACCINE  Completed   DEXA SCAN  Completed   HPV VACCINES  Aged Out    Allergies  Allergen Reactions   Mobic [Meloxicam] Other (See Comments)    Unknown reaction   Novocain [Procaine] Other (See Comments)    Unknown reaction    Outpatient Encounter Medications as of 03/16/2024  Medication Sig   acetaminophen (TYLENOL) 325 MG tablet Take 650 mg by mouth every 6 (six) hours as needed.   amLODipine (NORVASC) 5 MG tablet Take 5 mg by mouth daily.   Aspirin 81 MG  CAPS Take 81 mg by mouth daily.   atorvastatin (LIPITOR) 40 MG tablet TAKE 1 TABLET BY MOUTH EVERY DAY   cholecalciferol (VITAMIN D3) 25 MCG (1000 UNIT) tablet Take 1,000 Units by mouth daily.   diclofenac Sodium (VOLTAREN) 1 % GEL Apply 2 g topically 4 (four) times daily.   estradiol (ESTRACE) 0.1 MG/GM vaginal cream Place 1 Applicatorful vaginally See admin instructions. Apply 1g vaginally in the evening on Monday, Wednesday, Friday.   ferrous sulfate 325 (65 FE) MG EC tablet Take 325 mg by mouth every Monday, Wednesday, and Friday.   HYDROcodone-acetaminophen (NORCO/VICODIN) 5-325 MG tablet Take 0.5 tablets by mouth every 6 (six) hours as needed for moderate pain (pain score 4-6).   hydrocortisone 2.5 % cream Apply 1 Application topically at bedtime.   levothyroxine (SYNTHROID) 75 MCG tablet Take 75 mcg by mouth daily before breakfast.   lidocaine 4 % Place 1 patch onto the skin daily. Apply patch at 0800 and remove patch at at 2000   methocarbamol (ROBAXIN) 500 MG tablet Take 1 tablet (500 mg total) by mouth every 8 (eight) hours as needed for muscle spasms.   metoprolol succinate (TOPROL-XL) 50 MG 24 hr tablet Take 1 tablet (50 mg total) by mouth daily. Take with or immediately following a meal.   Multiple Vitamin (MULTIVITAMIN WITH MINERALS) TABS tablet Take 1 tablet by mouth daily.   mupirocin ointment (BACTROBAN) 2 % Apply 1 Application topically daily as needed (irritation).   nitroGLYCERIN (NITROSTAT) 0.4 MG SL tablet Place 0.4 mg under the tongue every 5 (five) minutes as needed for chest pain.   polyethylene glycol (MIRALAX / GLYCOLAX) 17 g packet Take 17 g by mouth See admin instructions. Every Mon, Wed, Friday for constipation. Mix with 4 ounces of fluid.   potassium chloride (KLOR-CON) 10 MEQ tablet Take 10 mEq by mouth daily as needed. (Patient not taking: Reported on 03/09/2024)   No facility-administered encounter medications on file as of 03/16/2024.    Review of Systems   Constitutional:  Negative for chills and fever.  HENT:  Negative for sore throat.   Respiratory:  Negative for cough, shortness of breath and wheezing.   Cardiovascular:  Positive for leg swelling. Negative for chest pain and palpitations.  Gastrointestinal:  Negative for abdominal distention, abdominal pain, blood in stool, constipation, diarrhea, nausea and vomiting.  Genitourinary:  Negative for dysuria.  Musculoskeletal:  Positive for arthralgias and back pain.  Neurological:  Positive for numbness. Negative for weakness.  Psychiatric/Behavioral:  Negative for confusion.     There were no vitals filed for this visit. There is no height or weight on file to calculate BMI. BP Readings from Last 3 Encounters:  03/15/24 (!) 136/50  03/10/24 (!) 158/72  03/08/24 (!) 110/43   Wt Readings from Last 3 Encounters:  03/14/24 162 lb 12.8 oz (73.8 kg)  03/09/24 162 lb 12.8 oz (73.8 kg)  03/08/24 163 lb 2.3 oz (74 kg)   Physical Exam Constitutional:      Appearance: Normal appearance.  HENT:     Head: Normocephalic and atraumatic.  Cardiovascular:     Rate and Rhythm: Normal rate and regular rhythm.     Heart sounds: Murmur heard.  Pulmonary:     Effort: Pulmonary effort is normal. No respiratory distress.     Breath sounds: Normal breath sounds. No wheezing.  Abdominal:     General: Bowel sounds are normal. There is no distension.     Tenderness: There is no abdominal tenderness. There is no guarding or rebound.     Comments:    Musculoskeletal:        General: Swelling present. No tenderness.     Comments: Chronic swelling - no recent worsening No spinal or paraspinal tenderness  Neurological:     Mental Status: She is alert. Mental status is at baseline.     Labs reviewed: Basic Metabolic Panel: Recent Labs    03/05/24 1817 03/06/24 0927 03/07/24 0612  NA 137 138 135  K 4.0 3.8 3.8  CL 106 109 110  CO2 17* 22 21*  GLUCOSE 143* 137* 101*  BUN 27* 21 25*   CREATININE 1.09* 1.27* 1.08*  CALCIUM 8.7* 8.5* 8.0*  MG 1.9 1.8 1.8  PHOS 4.1 4.0 3.7   Liver Function Tests: Recent Labs    03/05/24 1817 03/06/24 0927 03/07/24 0612  AST 39 30 26  ALT 26 24 23   ALKPHOS 165* 165* 162*  BILITOT 0.8 0.8 0.6  PROT 6.3* 6.2* 5.7*  ALBUMIN 2.4* 2.3* 2.3*   No results for input(s): "LIPASE", "AMYLASE" in the last 8760 hours. No results for input(s): "AMMONIA" in the last 8760 hours. CBC: Recent Labs    03/05/24 1817 03/06/24 0927 03/07/24 0612  WBC 7.7 6.5 5.9  NEUTROABS 3.9 3.6 3.0  HGB 9.6* 9.2* 9.0*  HCT 30.3* 29.1* 27.9*  MCV 101.7* 99.3 100.0  PLT 233 253 233   Cardiac Enzymes: No results for input(s): "CKTOTAL", "CKMB", "CKMBINDEX", "TROPONINI" in the last 8760 hours. BNP: Invalid input(s): "POCBNP" Lab Results  Component Value Date   HGBA1C 6.5 01/06/2024   Lab Results  Component Value Date   TSH 6.214 (H) 03/07/2024   Lab Results  Component Value Date   VITAMINB12 320 09/13/2013   No results found for: "FOLATE" Lab Results  Component Value Date   IRON 30 03/04/2024   TIBC 230 (L) 03/04/2024   FERRITIN 102 03/04/2024    Imaging and Procedures obtained prior to SNF admission: MR Cervical Spine W and Wo Contrast Result Date: 03/04/2024 CLINICAL DATA:  89 year old female status post fall. Age indeterminate T3 compression fracture  and nonspecific epidural gas within the cervical spine by CT. EXAM: MRI CERVICAL SPINE WITHOUT AND WITH CONTRAST TECHNIQUE: Multiplanar and multiecho pulse sequences of the cervical spine, to include the craniocervical junction and cervicothoracic junction, were obtained without and with intravenous contrast. CONTRAST:  7mL GADAVIST GADOBUTROL 1 MMOL/ML IV SOLN COMPARISON:  CT cervical spine 02/27/2024, thoracic spine 03/03/2024. FINDINGS: Intermittently motion degraded exam, especially most of the sagittal cervical imaging. Alignment: Multilevel degenerative appearing spondylolisthesis is stable,  worst at C5-C6 (3-4 mm there). Vertebrae: Normal background bone marrow signal. T3 mild superior endplate compression appears chronic and No marrow edema or evidence of acute osseous abnormality. Cord: Degenerative appearing spinal cord compression at C5-C6, up to moderate (series 6, image 10). Limited spinal cord detail on this exam but no obvious spinal cord signal changes. C5 and C6 have been the epicenter of the abnormal spinal canal gas on previous CTs. See additional details below. On postcontrast axial images there is mild abnormal enhancement within the spinal canal at C5-C6, primarily in the posterior midline which appears superimposed on a small volume of gas series 14, image 32) and medial to chronically degenerated facets there (series 14, image 33) where there is rim enhancement of un intracanalicular space-occupying lesion which is 5-6 mm in thickness. Contralateral right C5-C6 hyperdense canal space-occupying mass by CT appears to be low-density on MRI best seen on series 9, image 29, nonenhancing. There is no generalized dural thickening or enhancement. And no abnormal intradural enhancement identified. Posterior Fossa, vertebral arteries, paraspinal tissues: Mild degenerative mass effect at the cervicomedullary junction. No cervicomedullary, visible brainstem or cerebellar signal abnormality or abnormal enhancement. Preserved major vascular flow voids in the neck. No cervical paraspinal soft tissue inflammation or mass identified. Negative visible right lung apex. Disc levels: Advanced cervical spine degeneration most notable for C1-C2: Bulky ligamentous hypertrophy about the odontoid which is partially calcified by CT, superimposed on bulky C1-C2 degeneration. And there is also bulky partially calcified ligament flavum hypertrophy at that level. Combined there is C1-C2 level spinal stenosis with up to mild spinal cord mass effect (series 6 image 7). C5-C6: Spondylolisthesis of 3, up to 4 mm  associated with severe bilateral facet arthropathy, bilateral posterior element hypertrophy including apparent ligamentous hypertrophy which by CT is hyperdense and possibly calcified, low signal on this exam. Abnormal mostly midline posterior epidural space gas and left side rim calcified space-occupying lesion as seen on series 14, image 33. Moderate to severe spinal stenosis and spinal cord mass effect (series 9, image 33 and series 6, image 10). Bilateral C6 foraminal stenosis is moderate to severe, worse on the right. C6-C7 and C7-T1 less pronounced spondylolisthesis. No significant spinal stenosis at those levels. Bilateral foraminal stenosis. IMPRESSION: 1. Highly abnormal cervical spine C5-C6 level. Suboptimal MRI resolution but the constellation of CT and MRI findings favors advanced degenerative etiology - such as synovial cyst(s) and/or ligament flavum hypertrophy with partial calcification, associated vacuum phenomena - superimposed on spondylolisthesis and severe facet arthropathy. No strong evidence of spinal infection or malignancy. 2. High-grade spinal stenosis associated with #1 with cord compression there, but no spinal cord signal abnormality identified. 3. Other advanced cervical spine degeneration, most notably C1-C2 where bulky and partially calcified ligamentous hypertrophy both ventral and dorsal to the spinal cord. Spinal stenosis with less pronounced C1-C2 cord mass effect. 4. Chronic T3 superior endplate compression. No marrow edema or acute osseous abnormality identified. Electronically Signed   By: Odessa Fleming M.D.   On: 03/04/2024 04:49  CT ABDOMEN PELVIS W CONTRAST Result Date: 03/03/2024 CLINICAL DATA:  Abdominal trauma, blunt.  Multiple falls. EXAM: CT ABDOMEN AND PELVIS WITH CONTRAST TECHNIQUE: Multidetector CT imaging of the abdomen and pelvis was performed using the standard protocol following bolus administration of intravenous contrast. RADIATION DOSE REDUCTION: This exam was  performed according to the departmental dose-optimization program which includes automated exposure control, adjustment of the mA and/or kV according to patient size and/or use of iterative reconstruction technique. CONTRAST:  OMNIPAQUE IOHEXOL 300 MG/ML  SOLN COMPARISON:  09/20/2019. FINDINGS: Lower chest: The heart is enlarged and there is no pericardial effusion. Multi-vessel coronary artery calcifications are noted there is a trace left pleural effusion. Atelectasis is present at the lung bases bilaterally. Hepatobiliary: No focal liver abnormality is seen. No gallstones, gallbladder wall thickening, or biliary dilatation. Pancreas: Unremarkable. No pancreatic ductal dilatation or surrounding inflammatory changes. Spleen: Normal in size without focal abnormality. Adrenals/Urinary Tract: The adrenal glands are within normal limits. The kidneys enhance symmetrically. Renal atrophy is noted on the left. There are scattered hypodensities in the kidneys bilaterally which are too small to further characterize. A hyperdense lesion is noted in the mid left kidney measuring 1.2 cm with attenuation of 103 Hounsfield units, likely proteinaceous or hemorrhagic cyst. No renal calculus or hydronephrosis bilaterally. There is mild bladder wall thickening. Stomach/Bowel: Stomach is within normal limits. Appendix is not seen. No evidence of bowel wall thickening, distention, or inflammatory changes. No free air or pneumatosis. Scattered diverticula are present along the colon without evidence of diverticulitis. Vascular/Lymphatic: Aortic atherosclerosis. No enlarged abdominal or pelvic lymph nodes. Reproductive: Status post hysterectomy. No adnexal masses. Other: No abdominopelvic ascites. A fat containing umbilical hernia is noted. Musculoskeletal: Scattered densities and subcutaneous fat stranding is noted over the left lateral hip, the largest measuring 4.3 x 2.6 x 4.8 cm, compatible with hematoma. Hyperdense  attenuation is noted in the distal gluteal muscle on the left. Degenerative changes are present in the thoracolumbar spine and bilateral hips. Sternotomy wires are noted. IMPRESSION: 1. Subcutaneous hematoma and contusions over the left hip and likely small hematoma in the distal gluteal muscle on the left. No acute fracture is seen. 2. Trace left pleural effusion with atelectasis at the lung bases. 3. Diverticulosis without diverticulitis. 4. Aortic atherosclerosis. Electronically Signed   By: Thornell Sartorius M.D.   On: 03/03/2024 22:30   CT Thoracic Spine Wo Contrast Result Date: 03/03/2024 CLINICAL DATA:  Fall EXAM: CT THORACIC SPINE WITHOUT CONTRAST TECHNIQUE: Multidetector CT images of the thoracic were obtained using the standard protocol without intravenous contrast. RADIATION DOSE REDUCTION: This exam was performed according to the departmental dose-optimization program which includes automated exposure control, adjustment of the mA and/or kV according to patient size and/or use of iterative reconstruction technique. COMPARISON:  None Available. FINDINGS: Alignment: Normal. Vertebrae: There is a minimally depressed wedge compression fracture of T3. No other acute osseous abnormality. Vertebral body heights are otherwise maintained. Paraspinal and other soft tissues: There is small volume pneumorachis of the lower cervical spine, similar to 02/27/2024. Disc levels: There is no spinal canal stenosis. IMPRESSION: 1. Minimally depressed wedge compression fracture of T3. 2. Small amount of epidural gas with adjacent hyperdensity within the lower cervical spine, unchanged since 02/27/2024. While this is somewhat nonspecific, epidural infection can cause this appearance. MRI with and without contrast of the cervical spine is recommended. Electronically Signed   By: Deatra Robinson M.D.   On: 03/03/2024 22:12   DG Lumbar Spine Complete Result  Date: 03/03/2024 CLINICAL DATA:  Pain after fall. EXAM: LUMBAR SPINE -  COMPLETE 4+ VIEW COMPARISON:  None Available. FINDINGS: Lateral views are limited by positioning. Lower aspect of the lumbar spine is not included in the field of view. Five non-rib-bearing lumbar vertebra. Dextroscoliotic curvature. Trace retrolisthesis at tentatively L2-L3. No evidence of acute fracture or compression deformity. There is multilevel degenerative disc disease and facet hypertrophy. The sacroiliac joints are congruent. IMPRESSION: 1. Technically limited exam. Allowing for this, no acute fracture or subluxation of the lumbar spine. 2. Dextroscoliotic curvature with multilevel degenerative disc disease and facet hypertrophy. Electronically Signed   By: Narda Rutherford M.D.   On: 03/03/2024 18:42   DG Ankle Complete Left Result Date: 03/03/2024 CLINICAL DATA:  Pain after fall. EXAM: LEFT ANKLE COMPLETE - 3+ VIEW COMPARISON:  Included portion from tibia/fibular radiographs 02/27/2024 FINDINGS: There is no evidence of fracture, dislocation, or joint effusion. Ankle mortise is preserved. Mild chronic spurring of the medial and lateral malleoli. Plantar calcaneal spur and Achilles tendon enthesophyte. Vascular calcifications are seen. IMPRESSION: 1. No fracture or subluxation of the left ankle. 2. Plantar calcaneal spur and Achilles tendon enthesophyte. Electronically Signed   By: Narda Rutherford M.D.   On: 03/03/2024 18:41   CT Maxillofacial Wo Contrast Result Date: 03/03/2024 CLINICAL DATA:  Facial trauma, blunt.  Multiple falls. EXAM: CT MAXILLOFACIAL WITHOUT CONTRAST TECHNIQUE: Multidetector CT imaging of the maxillofacial structures was performed. Multiplanar CT image reconstructions were also generated. RADIATION DOSE REDUCTION: This exam was performed according to the departmental dose-optimization program which includes automated exposure control, adjustment of the mA and/or kV according to patient size and/or use of iterative reconstruction technique. COMPARISON:  CT head without contrast  02/27/2024. FINDINGS: Osseous: No acute fractures are present. Mandible is intact and located. Prominent soft tissue pannus is present about the dens with some erosive changes consistent with rheumatoid arthritis. No acute fractures are present. Orbits: Bilateral lens replacements are noted. Globes and orbits are otherwise unremarkable. Sinuses: The paranasal sinuses and mastoid air cells are clear. Soft tissues: The left supraorbital scalp hematoma seen previously is near completely resolved. No underlying fracture is present. No new soft tissue injury is present. Limited intracranial: Within normal limits for age.  Stable. IMPRESSION: 1. No acute fracture or acute injury to the facial bones. 2. Near complete resolution of left supraorbital scalp hematoma seen previously. 3. Prominent soft tissue pannus about the dens with some erosive changes consistent with rheumatoid arthritis. Electronically Signed   By: Marin Roberts M.D.   On: 03/03/2024 17:25    Assessment and Plan  Chronic low back pain Patient complains of pain in her lower back with no radiation Complains of left foot numbness Strength intact in lower extremities .CT  Minimally depressed wedge compression fracture of T3.  Will increase Tylenol to 1000 mg 3 times daily Lidocaine patch for low back We will increase his Cymbalta to 60 mg daily Continue with hydrocodone as needed         Anemia Hemoglobin 9 slightly trended from 8.8 No signs of bleeding Cont with iron tablets Will monitor Repeat cbc in a week  Hypertension Blood pressure 130/56 Continue with amlodipine, metoprolol  Chronic lower extremity swelling Stable Daughter states that patient refuses to use compression stockings Instructed patient to keep her feet elevated Avoid salty foods   Coronary artery disease Stable Denies chest pain, shortness of breath Continue with aspirin, Lipitor, isosorbide.    50  min Total time spent for obtaining  history,   performing a medically appropriate examination and evaluation, reviewing the tests,ordering  tests,  documenting clinical information in the electronic or other health record,  coordination (not separately reported)

## 2024-03-21 LAB — BASIC METABOLIC PANEL
BUN: 17 (ref 4–21)
CO2: 26 — AB (ref 13–22)
Chloride: 100 (ref 99–108)
Creatinine: 0.7 (ref 0.5–1.1)
Glucose: 52
Potassium: 4.1 meq/L (ref 3.5–5.1)
Sodium: 135 — AB (ref 137–147)

## 2024-03-21 LAB — COMPREHENSIVE METABOLIC PANEL
Calcium: 8.6 — AB (ref 8.7–10.7)
eGFR: 80

## 2024-03-22 ENCOUNTER — Encounter: Payer: Self-pay | Admitting: Adult Health

## 2024-03-22 ENCOUNTER — Non-Acute Institutional Stay (INDEPENDENT_AMBULATORY_CARE_PROVIDER_SITE_OTHER): Payer: Self-pay | Admitting: Adult Health

## 2024-03-22 DIAGNOSIS — E039 Hypothyroidism, unspecified: Secondary | ICD-10-CM | POA: Diagnosis not present

## 2024-03-22 DIAGNOSIS — I10 Essential (primary) hypertension: Secondary | ICD-10-CM | POA: Diagnosis not present

## 2024-03-22 DIAGNOSIS — R400 Somnolence: Secondary | ICD-10-CM | POA: Diagnosis not present

## 2024-03-22 NOTE — Progress Notes (Signed)
 Location:  [name] Room Number: 30 A Place of Service:  SNF (31) Provider:  Gillis Santa, NP    Patient Care Team: Mast, Man X, NP as PCP - General (Internal Medicine) Runell Gess, MD as PCP - Cardiology (Cardiology) Janalyn Harder, MD (Inactive) as Consulting Physician (Dermatology)  Extended Emergency Contact Information Primary Emergency Contact: [name],[name] Address: [address]          [place] [address] [place] Home Phone: [phone] Mobile Phone: [phone] Relation: Daughter Secondary Emergency Contact: [name] [place] of [place] Home Phone: [phone] Mobile Phone: [phone] Relation: Daughter  Code Status:  DNR Goals of care: Advanced Directive information    03/03/2024    1:55 PM  Advanced Directives  Does Patient Have a Medical Advance Directive? No  Would patient like information on creating a medical advance directive? No - Patient declined     Chief Complaint  Patient presents with   Acute Visit    Hard time picking up utensils.    HPI:  Pt is a 89 y.o. female seen today for an acute visit for an episode of difficulty picking up silverware while eating. She is a resident of [name]. She was seen in her room sleeping but woke up to verbal greetings. She requested to decrease her medications since she becomes sleepy. She takes Baclofen 5 mg in the morning, Norcon 5-325 mg 0.5 tab Q 6 hours PRN, Cymbalta 60 mg daily and Lidocaine 4% patch daily for back pain. She rated her back pain as 2-3/10.   BP 130/56, takes Metoprolol succinate 50 mg daily and amlodipine 5 mg daily for hypertension.  -   She takes levothyroxine 75 mcg daily for hypothyroidism, latest TSH 6.214 (03/07/2024)   Past Medical History:  Diagnosis Date   BCC (basal cell carcinoma of skin) 01/09/2009   Lower Central Back (tx p bx)   CAD (coronary artery disease)     possible ant wall MI ('97) - cath & CABG x5   Exogenous obesity    GERD (gastroesophageal reflux disease)    History of nuclear stress test 09/2012   lexiscan; mild perfusion defect in apical anterior & apical region (infarct/scar) - no significant ischemia, low risk    History of total bilateral knee replacement    Hypertension    Hypothyroidism    Nodular basal cell carcinoma (BCC) 08/17/2017   Left Calf (tx p bx)   PVC's (premature ventricular contractions)    SCC (squamous cell carcinoma) [place] 01/29/2004   Right Forearm (Cx3,5FU)   SCCA (squamous cell carcinoma) of skin 03/13/2005   Mid Upper Back   SCCA (squamous cell carcinoma) of skin 09/07/2005   Left Elbow(Cx3,5FU)   SCCA (squamous cell carcinoma) of skin 01/25/2006   Left Brow(in situ) (Cx3,5FU)   SCCA (squamous cell carcinoma) of skin 04/27/2006   Inner Left Shoulder(Keratoacanthoma) (Exc.)   SCCA (squamous cell carcinoma) of skin 03/22/2007   Left Temple(in situ) and Bridge of Nose(in situ) (Cs3,5FU)   SCCA (squamous cell carcinoma) of skin 05/12/2007   Top of Scalp(Cx3,5FU)   SCCA (squamous cell carcinoma) of skin 01/28/2011   Right Upper Arm(in situ)   SCCA (squamous cell carcinoma) of skin 07/26/2012   Glabella, Inferior Tip (Cx3,5FU)   SCCA (squamous cell carcinoma) of skin 03/08/2013   Left Front Scalp(in situ) and Upper Nose(in situ) (Cx3,5FU)   SCCA (squamous cell carcinoma) of skin 07/24/2013   Right Lower  Leg(Keratoacanthoma)   SCCA (squamous cell carcinoma) of skin 09/11/2015   Left Scalp(in situ) (Cx3,5FU)   SCCA (squamous cell carcinoma) of skin 01/15/2016   Left Forearm(in situ) and Left Temple(in situ) (tx p bx)   SCCA (squamous cell carcinoma) of skin 10/01/2016   Left Mid Forearm(in situ)(tx p bx) and Left Front Scalp(watch)   SCCA (squamous cell carcinoma) of skin 11/12/2016   Left Temple(Keratoacanthoma) (watch)   SCCA (squamous cell carcinoma) of skin 02/11/2017   Top Left  Hand(Keratoacanthoma) (tx p bx)   Squamous cell carcinoma in situ (SCCIS) 11/18/1999   Above Left Outer Eyebrow   Squamous cell carcinoma of scalp    removed - Dr. Jorja Loa   Superficial basal cell carcinoma (BCC) 07/14/2004   Left Scapula (Cx3,5FU)   Past Surgical History:  Procedure Laterality Date   ABDOMINAL HYSTERECTOMY  1984   APPENDECTOMY  1941   Carotid Doppler  12/07/2012   R & L ICA - 0-49% diameter reduction   CORONARY ARTERY BYPASS GRAFT  09/1996   cath & CABG x5 LIMA-LAD, SVG-sequential OD & OM, SVG sequential to PDA & PLA (Dr. Donata Clay)   REPLACEMENT TOTAL KNEE Bilateral 2001 & 2003   TONSILLECTOMY     TRANSTHORACIC ECHOCARDIOGRAM  08/23/2013   EF 50-55%, LV cavity size mod reduced, mild LVH, mild conc hypertrophy; mild AV stenosis & mild regurg; mild MR - ordered for murmur    Allergies  Allergen Reactions   Mobic [Meloxicam] Other (See Comments)    Unknown reaction   Novocain [Procaine] Other (See Comments)    Unknown reaction    Outpatient Encounter Medications as of 03/22/2024  Medication Sig   acetaminophen (TYLENOL) 325 MG tablet Take 500 mg by mouth 3 (three) times daily. Give 1000 mg by mouth by mouth three times a day for pain.   amLODipine (NORVASC) 5 MG tablet Take 5 mg by mouth daily.   Aspirin 81 MG CAPS Take 81 mg by mouth daily.   atorvastatin (LIPITOR) 40 MG tablet TAKE 1 TABLET BY MOUTH EVERY DAY   Baclofen 5 MG TABS Take 5 mg by mouth once. Give 5 mg by mouth one time a day   cholecalciferol (VITAMIN D3) 25 MCG (1000 UNIT) tablet Take 1,000 Units by mouth daily.   DULoxetine (CYMBALTA) 60 MG capsule Take 60 mg by mouth daily.   estradiol (ESTRACE) 0.1 MG/GM vaginal cream Place 1 Applicatorful vaginally See admin instructions. Apply 1g vaginally in the evening on Monday, Wednesday, Friday.   ferrous sulfate 325 (65 FE) MG EC tablet Take 325 mg by mouth every Monday, Wednesday, and Friday.   HYDROcodone-acetaminophen (NORCO/VICODIN) 5-325 MG tablet  Take 0.5 tablets by mouth every 6 (six) hours as needed for moderate pain (pain score 4-6).   hydrocortisone 2.5 % cream Apply 1 Application topically at bedtime.   levothyroxine (SYNTHROID) 75 MCG tablet Take 75 mcg by mouth daily before breakfast.   lidocaine 4 % Place 1 patch onto the skin daily. Apply patch at 0800 and remove patch at at 2000   metoprolol succinate (TOPROL-XL) 50 MG 24 hr tablet Take 1 tablet (50 mg total) by mouth daily. Take with or immediately following a meal.   Multiple Vitamin (MULTIVITAMIN WITH MINERALS) TABS tablet Take 1 tablet by mouth daily.   mupirocin ointment (BACTROBAN) 2 % Apply 1 Application topically daily as needed (irritation).   nitroGLYCERIN (NITROSTAT) 0.4 MG SL tablet Place 0.4 mg under the tongue every 5 (five) minutes as needed for chest  pain.   polyethylene glycol (MIRALAX / GLYCOLAX) 17 g packet Take 17 g by mouth See admin instructions. Every Mon, Wed, Friday for constipation. Mix with 4 ounces of fluid.   diclofenac Sodium (VOLTAREN) 1 % GEL Apply 2 g topically 4 (four) times daily. (Patient not taking: Reported on 03/22/2024)   methocarbamol (ROBAXIN) 500 MG tablet Take 1 tablet (500 mg total) by mouth every 8 (eight) hours as needed for muscle spasms. (Patient not taking: Reported on 03/22/2024)   potassium chloride (KLOR-CON) 10 MEQ tablet Take 10 mEq by mouth daily as needed. (Patient not taking: Reported on 03/09/2024)   No facility-administered encounter medications on file as of 03/22/2024.    Review of Systems  Constitutional:  Negative for appetite change, chills, fatigue and fever.  HENT:  Negative for congestion, hearing loss, rhinorrhea and sore throat.   Eyes: Negative.   Respiratory:  Negative for cough, shortness of breath and wheezing.   Cardiovascular:  Positive for leg swelling. Negative for chest pain and palpitations.       Trace BLE edema  Gastrointestinal:  Negative for abdominal pain, constipation, diarrhea, nausea and  vomiting.  Genitourinary:  Negative for dysuria.  Musculoskeletal:  Positive for back pain. Negative for arthralgias and myalgias.  Skin:  Negative for color change, rash and wound.  Neurological:  Negative for dizziness, weakness and headaches.  Psychiatric/Behavioral:  Negative for behavioral problems. The patient is not nervous/anxious.     Immunization History  Administered Date(s) Administered   Fluad Quad(high Dose 65+) 08/30/2019   Influenza, High Dose Seasonal PF 10/10/2018, 09/29/2023   Influenza,inj,Quad PF,6-35 Mos 10/13/2022   Influenza,inj,quad, With Preservative 08/30/2019   Moderna Covid-19 Vaccine Bivalent Booster 32yrs & up 10/29/2022   Pneumococcal Conjugate-13 01/28/2013, 10/17/2014   Pneumococcal-Unspecified 08/19/2010   Td (Adult) 09/27/2022   Unspecified SARS-COV-2 Vaccination 01/01/2020, 09/16/2021   Pertinent  Health Maintenance Due  Topic Date Due   FOOT EXAM  Never done   OPHTHALMOLOGY EXAM  Never done   HEMOGLOBIN A1C  07/05/2024   INFLUENZA VACCINE  Completed   DEXA SCAN  Completed      07/09/2021   10:00 PM 07/10/2021    8:53 AM 07/10/2021    9:00 PM 07/11/2021    8:40 AM 07/21/2021   10:02 AM  Fall Risk  (RETIRED) Patient Fall Risk Level Moderate fall risk Moderate fall risk Moderate fall risk Moderate fall risk Low fall risk   Functional Status Survey:    Vitals:   03/22/24 1051  BP: (!) 130/56  Pulse: 71  Resp: (!) 21  Temp: (!) 97.5 F (36.4 C)  SpO2: 95%  Weight: 162 lb 12.8 oz (73.8 kg)  Height: 5\' 6"  (1.676 m)   Body mass index is 26.28 kg/m. Physical Exam Constitutional:      General: She is not in acute distress.    Appearance: Normal appearance.  HENT:     Head: Normocephalic and atraumatic.     Nose: Nose normal.     Mouth/Throat:     Mouth: Mucous membranes are moist.  Eyes:     Conjunctiva/sclera: Conjunctivae normal.  Cardiovascular:     Rate and Rhythm: Normal rate and regular rhythm.     Heart sounds: Murmur  heard.  Pulmonary:     Effort: Pulmonary effort is normal.     Breath sounds: Normal breath sounds.  Abdominal:     General: Bowel sounds are normal.     Palpations: Abdomen is soft.  Musculoskeletal:  Cervical back: Normal range of motion.     Right lower leg: Edema present.     Left lower leg: Edema present.     Comments: Trace edema BLE  Skin:    General: Skin is warm and dry.  Neurological:     Mental Status: She is alert.  Psychiatric:        Mood and Affect: Mood normal.        Behavior: Behavior normal.     Labs reviewed: Recent Labs    03/05/24 1817 03/06/24 0927 03/07/24 0612 03/14/24 0000 03/21/24 0000  NA 137 138 135 137 135*  K 4.0 3.8 3.8 4.5 4.1  CL 106 109 110 104 100  CO2 17* 22 21* 26* 26*  GLUCOSE 143* 137* 101*  --   --   BUN 27* 21 25* 18 17  CREATININE 1.09* 1.27* 1.08* 0.7 0.7  CALCIUM 8.7* 8.5* 8.0* 8.5* 8.6*  MG 1.9 1.8 1.8  --   --   PHOS 4.1 4.0 3.7  --   --    Recent Labs    03/05/24 1817 03/06/24 0927 03/07/24 0612 03/14/24 0000  AST 39 30 26 37*  ALT 26 24 23 23   ALKPHOS 165* 165* 162* 219*  BILITOT 0.8 0.8 0.6  --   PROT 6.3* 6.2* 5.7*  --   ALBUMIN 2.4* 2.3* 2.3* 3.2*   Recent Labs    03/05/24 1817 03/06/24 0927 03/07/24 0612 03/14/24 0000  WBC 7.7 6.5 5.9 6.0  NEUTROABS 3.9 3.6 3.0  --   HGB 9.6* 9.2* 9.0* 9.0*  HCT 30.3* 29.1* 27.9* 28*  MCV 101.7* 99.3 100.0  --   PLT 233 253 233 241   Lab Results  Component Value Date   TSH 6.214 (H) 03/07/2024   Lab Results  Component Value Date   HGBA1C 6.5 01/06/2024   Lab Results  Component Value Date   CHOL 151 01/06/2024   HDL 48 01/06/2024   LDLCALC 84 01/06/2024   TRIG 101 01/06/2024   CHOLHDL 3.0 07/09/2021    Significant Diagnostic Results in last 30 days:  MR Cervical Spine W and Wo Contrast Result Date: 03/04/2024 CLINICAL DATA:  89 year old female status post fall. Age indeterminate T3 compression fracture and nonspecific epidural gas within the  cervical spine by CT. EXAM: MRI CERVICAL SPINE WITHOUT AND WITH CONTRAST TECHNIQUE: Multiplanar and multiecho pulse sequences of the cervical spine, to include the craniocervical junction and cervicothoracic junction, were obtained without and with intravenous contrast. CONTRAST:  7mL GADAVIST GADOBUTROL 1 MMOL/ML IV SOLN COMPARISON:  CT cervical spine 02/27/2024, thoracic spine 03/03/2024. FINDINGS: Intermittently motion degraded exam, especially most of the sagittal cervical imaging. Alignment: Multilevel degenerative appearing spondylolisthesis is stable, worst at C5-C6 (3-4 mm there). Vertebrae: Normal background bone marrow signal. T3 mild superior endplate compression appears chronic and No marrow edema or evidence of acute osseous abnormality. Cord: Degenerative appearing spinal cord compression at C5-C6, up to moderate (series 6, image 10). Limited spinal cord detail on this exam but no obvious spinal cord signal changes. C5 and C6 have been the epicenter of the abnormal spinal canal gas on previous CTs. See additional details below. On postcontrast axial images there is mild abnormal enhancement within the spinal canal at C5-C6, primarily in the posterior midline which appears superimposed on a small volume of gas series 14, image 32) and medial to chronically degenerated facets there (series 14, image 33) where there is rim enhancement of un intracanalicular space-occupying lesion which is  5-6 mm in thickness. Contralateral right C5-C6 hyperdense canal space-occupying mass by CT appears to be low-density on MRI best seen on series 9, image 29, nonenhancing. There is no generalized dural thickening or enhancement. And no abnormal intradural enhancement identified. Posterior Fossa, vertebral arteries, paraspinal tissues: Mild degenerative mass effect at the cervicomedullary junction. No cervicomedullary, visible brainstem or cerebellar signal abnormality or abnormal enhancement. Preserved major vascular flow  voids in the neck. No cervical paraspinal soft tissue inflammation or mass identified. Negative visible right lung apex. Disc levels: Advanced cervical spine degeneration most notable for C1-C2: Bulky ligamentous hypertrophy about the odontoid which is partially calcified by CT, superimposed on bulky C1-C2 degeneration. And there is also bulky partially calcified ligament flavum hypertrophy at that level. Combined there is C1-C2 level spinal stenosis with up to mild spinal cord mass effect (series 6 image 7). C5-C6: Spondylolisthesis of 3, up to 4 mm associated with severe bilateral facet arthropathy, bilateral posterior element hypertrophy including apparent ligamentous hypertrophy which by CT is hyperdense and possibly calcified, low signal on this exam. Abnormal mostly midline posterior epidural space gas and left side rim calcified space-occupying lesion as seen on series 14, image 33. Moderate to severe spinal stenosis and spinal cord mass effect (series 9, image 33 and series 6, image 10). Bilateral C6 foraminal stenosis is moderate to severe, worse on the right. C6-C7 and C7-T1 less pronounced spondylolisthesis. No significant spinal stenosis at those levels. Bilateral foraminal stenosis. IMPRESSION: 1. Highly abnormal cervical spine C5-C6 level. Suboptimal MRI resolution but the constellation of CT and MRI findings favors advanced degenerative etiology - such as synovial cyst(s) and/or ligament flavum hypertrophy with partial calcification, associated vacuum phenomena - superimposed on spondylolisthesis and severe facet arthropathy. No strong evidence of spinal infection or malignancy. 2. High-grade spinal stenosis associated with #1 with cord compression there, but no spinal cord signal abnormality identified. 3. Other advanced cervical spine degeneration, most notably C1-C2 where bulky and partially calcified ligamentous hypertrophy both ventral and dorsal to the spinal cord. Spinal stenosis with less  pronounced C1-C2 cord mass effect. 4. Chronic T3 superior endplate compression. No marrow edema or acute osseous abnormality identified. Electronically Signed   By: Odessa Fleming M.D.   On: 03/04/2024 04:49   CT ABDOMEN PELVIS W CONTRAST Result Date: 03/03/2024 CLINICAL DATA:  Abdominal trauma, blunt.  Multiple falls. EXAM: CT ABDOMEN AND PELVIS WITH CONTRAST TECHNIQUE: Multidetector CT imaging of the abdomen and pelvis was performed using the standard protocol following bolus administration of intravenous contrast. RADIATION DOSE REDUCTION: This exam was performed according to the departmental dose-optimization program which includes automated exposure control, adjustment of the mA and/or kV according to patient size and/or use of iterative reconstruction technique. CONTRAST:  OMNIPAQUE IOHEXOL 300 MG/ML  SOLN COMPARISON:  09/20/2019. FINDINGS: Lower chest: The heart is enlarged and there is no pericardial effusion. Multi-vessel coronary artery calcifications are noted there is a trace left pleural effusion. Atelectasis is present at the lung bases bilaterally. Hepatobiliary: No focal liver abnormality is seen. No gallstones, gallbladder wall thickening, or biliary dilatation. Pancreas: Unremarkable. No pancreatic ductal dilatation or surrounding inflammatory changes. Spleen: Normal in size without focal abnormality. Adrenals/Urinary Tract: The adrenal glands are within normal limits. The kidneys enhance symmetrically. Renal atrophy is noted on the left. There are scattered hypodensities in the kidneys bilaterally which are too small to further characterize. A hyperdense lesion is noted in the mid left kidney measuring 1.2 cm with attenuation of 103 Hounsfield units, likely proteinaceous  or hemorrhagic cyst. No renal calculus or hydronephrosis bilaterally. There is mild bladder wall thickening. Stomach/Bowel: Stomach is within normal limits. Appendix is not seen. No evidence of bowel wall thickening, distention,  or inflammatory changes. No free air or pneumatosis. Scattered diverticula are present along the colon without evidence of diverticulitis. Vascular/Lymphatic: Aortic atherosclerosis. No enlarged abdominal or pelvic lymph nodes. Reproductive: Status post hysterectomy. No adnexal masses. Other: No abdominopelvic ascites. A fat containing umbilical hernia is noted. Musculoskeletal: Scattered densities and subcutaneous fat stranding is noted over the left lateral hip, the largest measuring 4.3 x 2.6 x 4.8 cm, compatible with hematoma. Hyperdense attenuation is noted in the distal gluteal muscle on the left. Degenerative changes are present in the thoracolumbar spine and bilateral hips. Sternotomy wires are noted. IMPRESSION: 1. Subcutaneous hematoma and contusions over the left hip and likely small hematoma in the distal gluteal muscle on the left. No acute fracture is seen. 2. Trace left pleural effusion with atelectasis at the lung bases. 3. Diverticulosis without diverticulitis. 4. Aortic atherosclerosis. Electronically Signed   By: Thornell Sartorius M.D.   On: 03/03/2024 22:30   CT Thoracic Spine Wo Contrast Result Date: 03/03/2024 CLINICAL DATA:  Fall EXAM: CT THORACIC SPINE WITHOUT CONTRAST TECHNIQUE: Multidetector CT images of the thoracic were obtained using the standard protocol without intravenous contrast. RADIATION DOSE REDUCTION: This exam was performed according to the departmental dose-optimization program which includes automated exposure control, adjustment of the mA and/or kV according to patient size and/or use of iterative reconstruction technique. COMPARISON:  None Available. FINDINGS: Alignment: Normal. Vertebrae: There is a minimally depressed wedge compression fracture of T3. No other acute osseous abnormality. Vertebral body heights are otherwise maintained. Paraspinal and other soft tissues: There is small volume pneumorachis of the lower cervical spine, similar to 02/27/2024. Disc levels: There  is no spinal canal stenosis. IMPRESSION: 1. Minimally depressed wedge compression fracture of T3. 2. Small amount of epidural gas with adjacent hyperdensity within the lower cervical spine, unchanged since 02/27/2024. While this is somewhat nonspecific, epidural infection can cause this appearance. MRI with and without contrast of the cervical spine is recommended. Electronically Signed   By: Deatra Robinson M.D.   On: 03/03/2024 22:12   DG Lumbar Spine Complete Result Date: 03/03/2024 CLINICAL DATA:  Pain after fall. EXAM: LUMBAR SPINE - COMPLETE 4+ VIEW COMPARISON:  None Available. FINDINGS: Lateral views are limited by positioning. Lower aspect of the lumbar spine is not included in the field of view. Five non-rib-bearing lumbar vertebra. Dextroscoliotic curvature. Trace retrolisthesis at tentatively L2-L3. No evidence of acute fracture or compression deformity. There is multilevel degenerative disc disease and facet hypertrophy. The sacroiliac joints are congruent. IMPRESSION: 1. Technically limited exam. Allowing for this, no acute fracture or subluxation of the lumbar spine. 2. Dextroscoliotic curvature with multilevel degenerative disc disease and facet hypertrophy. Electronically Signed   By: Narda Rutherford M.D.   On: 03/03/2024 18:42   DG Ankle Complete Left Result Date: 03/03/2024 CLINICAL DATA:  Pain after fall. EXAM: LEFT ANKLE COMPLETE - 3+ VIEW COMPARISON:  Included portion from tibia/fibular radiographs 02/27/2024 FINDINGS: There is no evidence of fracture, dislocation, or joint effusion. Ankle mortise is preserved. Mild chronic spurring of the medial and lateral malleoli. Plantar calcaneal spur and Achilles tendon enthesophyte. Vascular calcifications are seen. IMPRESSION: 1. No fracture or subluxation of the left ankle. 2. Plantar calcaneal spur and Achilles tendon enthesophyte. Electronically Signed   By: Narda Rutherford M.D.   On: 03/03/2024 18:41  CT Maxillofacial Wo Contrast Result  Date: 03/03/2024 CLINICAL DATA:  Facial trauma, blunt.  Multiple falls. EXAM: CT MAXILLOFACIAL WITHOUT CONTRAST TECHNIQUE: Multidetector CT imaging of the maxillofacial structures was performed. Multiplanar CT image reconstructions were also generated. RADIATION DOSE REDUCTION: This exam was performed according to the departmental dose-optimization program which includes automated exposure control, adjustment of the mA and/or kV according to patient size and/or use of iterative reconstruction technique. COMPARISON:  CT head without contrast 02/27/2024. FINDINGS: Osseous: No acute fractures are present. Mandible is intact and located. Prominent soft tissue pannus is present about the dens with some erosive changes consistent with rheumatoid arthritis. No acute fractures are present. Orbits: Bilateral lens replacements are noted. Globes and orbits are otherwise unremarkable. Sinuses: The paranasal sinuses and mastoid air cells are clear. Soft tissues: The left supraorbital scalp hematoma seen previously is near completely resolved. No underlying fracture is present. No new soft tissue injury is present. Limited intracranial: Within normal limits for age.  Stable. IMPRESSION: 1. No acute fracture or acute injury to the facial bones. 2. Near complete resolution of left supraorbital scalp hematoma seen previously. 3. Prominent soft tissue pannus about the dens with some erosive changes consistent with rheumatoid arthritis. Electronically Signed   By: Marin Roberts M.D.   On: 03/03/2024 17:25   DG Tibia/Fibula Left Result Date: 02/27/2024 CLINICAL DATA:  Recent fall with left leg pain, initial encounter EXAM: LEFT TIBIA AND FIBULA - 2 VIEW COMPARISON:  None Available. FINDINGS: Left knee joint replacement is seen. Vascular calcifications are identified. No acute fracture or dislocation is noted. No soft tissue abnormality is seen. IMPRESSION: No acute abnormality noted. Electronically Signed   By: Alcide Clever  M.D.   On: 02/27/2024 03:48   CT Head Wo Contrast Result Date: 02/27/2024 CLINICAL DATA:  Level 2 fall, on blood thinners, hematoma to left forehead EXAM: CT HEAD WITHOUT CONTRAST CT CERVICAL SPINE WITHOUT CONTRAST TECHNIQUE: Multidetector CT imaging of the head and cervical spine was performed following the standard protocol without intravenous contrast. Multiplanar CT image reconstructions of the cervical spine were also generated. RADIATION DOSE REDUCTION: This exam was performed according to the departmental dose-optimization program which includes automated exposure control, adjustment of the mA and/or kV according to patient size and/or use of iterative reconstruction technique. COMPARISON:  CT head and C-spine 12/07/2023 FINDINGS: CT HEAD FINDINGS Brain: No intracranial hemorrhage, mass effect, or evidence of acute infarct. No hydrocephalus. 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 Age-commensurate cerebral atrophy and chronic small vessel ischemic disease. Vascular: No hyperdense vessel. Intracranial arterial calcification. Skull: No fracture or focal lesion.  Left forehead hematoma. Sinuses/Orbits: No acute finding. Other: None. CT CERVICAL SPINE FINDINGS Alignment: No evidence of traumatic malalignment. Chronic subluxations of multiple vertebral bodies are unchanged. Skull base and vertebrae: No acute fracture in the cervical spine. Age indeterminate superior endplate compression fracture of T3, new since 12/07/2023. There is less than 10% vertebral body height loss. No retropulsion. Sclerosis in the superior endplate of T3 is new compared to 12/07/2023. Soft tissues and spinal canal: No prevertebral fluid or swelling. No visible canal hematoma. Disc levels: Degenerative pannus formation about the atlantoaxial joint is unchanged. Multilevel spondylosis and facet arthropathy is unchanged. No severe spinal canal narrowing. Upper chest: No acute abnormality.  Other: Carotid calcification. IMPRESSION: 1. No acute intracranial abnormality. Left forehead hematoma. 2. No acute fracture in the cervical spine. 3. Age indeterminate superior endplate compression fracture of T3. There is less than 10%  vertebral body height loss. No retropulsion. 4. 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 Electronically Signed   By: Minerva Fester M.D.   On: 02/27/2024 02:32   CT Cervical Spine Wo Contrast Result Date: 02/27/2024 CLINICAL DATA:  Level 2 fall, on blood thinners, hematoma to left forehead EXAM: CT HEAD WITHOUT CONTRAST CT CERVICAL SPINE WITHOUT CONTRAST TECHNIQUE: Multidetector CT imaging of the head and cervical spine was performed following the standard protocol without intravenous contrast. Multiplanar CT image reconstructions of the cervical spine were also generated. RADIATION DOSE REDUCTION: This exam was performed according to the departmental dose-optimization program which includes automated exposure control, adjustment of the mA and/or kV according to patient size and/or use of iterative reconstruction technique. COMPARISON:  CT head and C-spine 12/07/2023 FINDINGS: CT HEAD FINDINGS Brain: No intracranial hemorrhage, mass effect, or evidence of acute infarct. No hydrocephalus. 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 Age-commensurate cerebral atrophy and chronic small vessel ischemic disease. Vascular: No hyperdense vessel. Intracranial arterial calcification. Skull: No fracture or focal lesion.  Left forehead hematoma. Sinuses/Orbits: No acute finding. Other: None. CT CERVICAL SPINE FINDINGS Alignment: No evidence of traumatic malalignment. Chronic subluxations of multiple vertebral bodies are unchanged. Skull base and vertebrae: No acute fracture in the cervical spine. Age indeterminate superior endplate compression fracture of T3, new since 12/07/2023. There is less  than 10% vertebral body height loss. No retropulsion. Sclerosis in the superior endplate of T3 is new compared to 12/07/2023. Soft tissues and spinal canal: No prevertebral fluid or swelling. No visible canal hematoma. Disc levels: Degenerative pannus formation about the atlantoaxial joint is unchanged. Multilevel spondylosis and facet arthropathy is unchanged. No severe spinal canal narrowing. Upper chest: No acute abnormality. Other: Carotid calcification. IMPRESSION: 1. No acute intracranial abnormality. Left forehead hematoma. 2. No acute fracture in the cervical spine. 3. Age indeterminate superior endplate compression fracture of T3. There is less than 10% vertebral body height loss. No retropulsion. 4. 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 Electronically Signed   By: Minerva Fester M.D.   On: 02/27/2024 02:32   DG Pelvis Portable Result Date: 02/27/2024 CLINICAL DATA:  Level 2 trauma, fall, on blood thinners EXAM: PORTABLE PELVIS 1-2 VIEWS COMPARISON:  12/07/2023 FINDINGS: No acute fracture or dislocation. Degenerative changes pubic symphysis, both hips, SI joints and lower lumbar spine. IMPRESSION: No acute fracture or dislocation. Electronically Signed   By: Minerva Fester M.D.   On: 02/27/2024 02:04   DG Chest Portable 1 View Result Date: 02/27/2024 CLINICAL DATA:  Level 2 trauma, fall on blood thinners EXAM: PORTABLE CHEST 1 VIEW COMPARISON:  12/07/2023 FINDINGS: Sternotomy and CABG. Stable cardiomediastinal silhouette. Aortic atherosclerotic calcification. Elevated right hemidiaphragm and right basilar atelectasis. Additional left basilar atelectasis. Otherwise no focal consolidation. No pleural effusion or pneumothorax. No displaced rib fractures. IMPRESSION: No change from 12/07/2023. Bibasilar atelectasis. Elevated right hemidiaphragm. Electronically Signed   By: Minerva Fester M.D.   On: 02/27/2024 02:03    Assessment/Plan 1. Somnolence  (Primary) -  change Baclofen to PRN -  continue Norco PRN, Cymbalta and Lidocaine patch   2. Primary hypertension -  BP stable -  continue Amlodipine and Metoprolol succinate  3. Hypothyroidism, unspecified type Lab Results  Component Value Date   TSH 6.214 (H) 03/07/2024    -  continue Levothyroxine    Family/ staff Communication:  Discussed plan of care with resident and charge nurse.  Labs/tests ordered:   None

## 2024-03-27 ENCOUNTER — Ambulatory Visit: Attending: Cardiovascular Disease | Admitting: Cardiovascular Disease

## 2024-03-27 VITALS — BP 90/50 | HR 82 | Ht 64.0 in | Wt 160.0 lb

## 2024-03-27 DIAGNOSIS — I2510 Atherosclerotic heart disease of native coronary artery without angina pectoris: Secondary | ICD-10-CM | POA: Diagnosis not present

## 2024-03-27 DIAGNOSIS — I5032 Chronic diastolic (congestive) heart failure: Secondary | ICD-10-CM

## 2024-03-27 DIAGNOSIS — Z951 Presence of aortocoronary bypass graft: Secondary | ICD-10-CM

## 2024-03-27 DIAGNOSIS — E785 Hyperlipidemia, unspecified: Secondary | ICD-10-CM

## 2024-03-27 DIAGNOSIS — I350 Nonrheumatic aortic (valve) stenosis: Secondary | ICD-10-CM

## 2024-03-27 DIAGNOSIS — I10 Essential (primary) hypertension: Secondary | ICD-10-CM

## 2024-03-27 NOTE — Patient Instructions (Signed)
 Medication Instructions:  Your physician has recommended you make the following change in your medication:   -Stop taking amlodipine (norvasc).  *If you need a refill on your cardiac medications before your next appointment, please call your pharmacy*  Follow-Up: At [facility], you and your health needs are our priority.  As part of our continuing mission to provide you with exceptional heart care, our providers are all part of one team.  This team includes your primary Cardiologist (physician) and Advanced Practice Providers or APPs (Physician Assistants and Nurse Practitioners) who all work together to provide you with the care you need, when you need it.  Your next appointment:   3 month(s)  Provider:   Edd Fabian, FNP       Then, Nanetta Batty, MD will plan to see you again in 12 month(s).    We recommend signing up for the patient portal called "MyChart".  Sign up information is provided on this After Visit Summary.  MyChart is used to connect with patients for Virtual Visits (Telemedicine).  Patients are able to view lab/test results, encounter notes, upcoming appointments, etc.  Non-urgent messages can be sent to your provider as well.   To learn more about what you can do with MyChart, go to [email or web address].   Other Instructions       1st Floor: - Lobby - Registration  - Pharmacy  - Lab - Cafe  2nd Floor: - PV Lab - Diagnostic Testing (echo, CT, nuclear med)  3rd Floor: - Vacant  4th Floor: - TCTS (cardiothoracic surgery) - AFib Clinic - Structural Heart Clinic - Vascular Surgery  - Vascular Ultrasound  5th Floor: - HeartCare Cardiology (general and EP) - Clinical Pharmacy for coumadin, hypertension, lipid, weight-loss medications, and med management appointments    Valet parking services will be available as well.

## 2024-03-27 NOTE — Progress Notes (Signed)
 03/27/2024 Kathryn Carlson   1927-03-24  [id number]  Primary Physician Mast, Man X, NP Primary Cardiologist: Runell Gess MD Milagros Loll, [place], [place]  HPI:  Kathryn Carlson is a 89 y.o.   moderately overweight widowed Caucasian female mother of 4 children who transitioned her care from [name] to myself at her request.  She lost one of her 2 sons several years ago from dementia.  I last saw her in the office 8//23.  She is accompanied by her daughter Santina Evans today as well as her daughter Andrey Campanile who is the POA.  She was formally a patient of Dr. Kandis Cocking. She has a history of ischemic heart disease status post myocardial infarction at age 23. She had coronary artery bypass grafting in 1997 by Dr. Donata Clay  Her last Myoview performed in 2013 was nonischemic. Her other Problems include symptomatic PVCs evaluated by Dr. Johney Frame in the past as well as history of hypertension and hyperlipidemia. She denies chest pain or shortness of breath since I saw her in the office one year ago. Echo performed 01/28/18 revealed normal LV systolic function with mild aortic stenosis.   She lives independently in a assisted care facility (friend[facility])..  She was admitted to [facility] 07/08/2021 for 3 days with non-STEMI.  Her symptoms were pain between her shoulder blades.  Her troponins rose to 5000.  2D echo revealed normal LV function with moderate aortic stenosis.  It was elected not to pursue an aggressive interventional approach.  She was started on clopidogrel.  Since I saw her in the office a year and a half ago she has been hospitalized recently with profound fatigue.  She is found to have a UTI.  She is also had multiple falls and currently is nonambulatory.  She is transition from assisted care to skilled care at [facility] home.  She is a DNR.  She was found to be severely anemic and had transfusions now has a hemoglobin of 9.  She has at least moderate aortic stenosis by 2D echo in 2022  but this has not been rechecked.  She has a harsh outflow tract murmur on exam which I suspect correlates with progression of his her aortic stenosis to severe.  She is not a candidate for intervention.  She is somewhat hypotensive today with blood pressure of 90/50 on metoprolol and amlodipine.  I am going to discontinue the amlodipine.  No outpatient medications have been marked as taking for the 03/27/24 encounter (Office Visit) with Runell Gess, MD.     Allergies  Allergen Reactions   Mobic [Meloxicam] Other (See Comments)    Unknown reaction   Novocain [Procaine] Other (See Comments)    Unknown reaction    Social History   Socioeconomic History   Marital status: Widowed    Spouse name: Not on file   Number of children: 4   Years of education: 26   Highest education level: Not on file  Occupational History   Not on file  Tobacco Use   Smoking status: Never   Smokeless tobacco: Never  Vaping Use   Vaping status: Not on file  Substance and Sexual Activity   Alcohol use: Yes    Comment: "a drink of wine every now and then"   Drug use: Not on file   Sexual activity: Not on file  Other Topics Concern   Not on file  Social History Narrative   Not on file   Social  Drivers of Corporate investment banker Strain: Not on file  Food Insecurity: No Food Insecurity (03/04/2024)   Hunger Vital Sign    Worried About Running Out of Food in the Last Year: Never true    Ran Out of Food in the Last Year: Never true  Transportation Needs: No Transportation Needs (03/04/2024)   PRAPARE - Administrator, Civil Service (Medical): No    Lack of Transportation (Non-Medical): No  Physical Activity: Not on file  Stress: Not on file  Social Connections: Socially Isolated (03/04/2024)   Social Connection and Isolation Panel [NHANES]    Frequency of Communication with Friends and Family: More than three times a week    Frequency of Social Gatherings with Friends and Family: More  than three times a week    Attends Religious Services: Never    Database administrator or Organizations: No    Attends Banker Meetings: Never    Marital Status: Widowed  Intimate Partner Violence: Not At Risk (03/04/2024)   Humiliation, Afraid, Rape, and Kick questionnaire    Fear of Current or Ex-Partner: No    Emotionally Abused: No    Physically Abused: No    Sexually Abused: No     Review of Systems: General: negative for chills, fever, night sweats or weight changes.  Cardiovascular: negative for chest pain, dyspnea on exertion, edema, orthopnea, palpitations, paroxysmal nocturnal dyspnea or shortness of breath Dermatological: negative for rash Respiratory: negative for cough or wheezing Urologic: negative for hematuria Abdominal: negative for nausea, vomiting, diarrhea, bright red blood per rectum, melena, or hematemesis Neurologic: negative for visual changes, syncope, or dizziness All other systems reviewed and are otherwise negative except as noted above.    Blood pressure (!) 90/50, pulse 82, height 5\' 4"  (1.626 m), weight 160 lb (72.6 kg), SpO2 94%.  General appearance: alert and no distress Neck: no adenopathy, no carotid bruit, no JVD, supple, symmetrical, trachea midline, and thyroid not enlarged, symmetric, no tenderness/mass/nodules Lungs: clear to auscultation bilaterally Heart: 3/6 harsh outflow tract murmur consistent with aortic stenosis. Extremities: 1+ pitting edema bilaterally Pulses: 2+ and symmetric Skin: Skin color, texture, turgor normal. No rashes or lesions Neurologic: Grossly normal  EKG EKG Interpretation Date/Time:  Monday March 27 2024 11:31:05 EDT Ventricular Rate:  82 PR Interval:  216 QRS Duration:  112 QT Interval:  378 QTC Calculation: 441 R Axis:   -7  Text Interpretation: Sinus rhythm with 1st degree A-V block Moderate voltage criteria for LVH, may be normal variant ( R in aVL , Cornell product ) Possible Anterior  infarct , age undetermined When compared with ECG of 06-Mar-2024 08:34, Premature ventricular complexes are no longer Present Incomplete left bundle branch block is no longer Present Confirmed by Nanetta Batty [phone]) on 03/27/2024 11:51:31 AM    ASSESSMENT AND PLAN:   HTN (hypertension) History of essential hypertension with blood pressure measured today at 90/50.  She is on amlodipine and metoprolol.  I am going to stop her amlodipine today.  Dyslipidemia History of dyslipidemia on atorvastatin with lipid profile performed 01/06/2024 revealing total cholesterol 151, LDL 84 and HDL 48.  S/P CABG x 3 History of CAD status post coronary artery bypass grafting by Dr. Maren Beach in 1997.  She did have a non-STEMI in 07/08/2021 with troponins that rose to 5000 but did not undergo cardiac catheterization at that time.  She currently denies chest pain or shortness of breath.  Aortic stenosis, mild History of at  least moderate aortic stenosis by 2D echo performed 07/09/2021 with a valve area of 1 cm.  She has not had an echo since.  She has a harsh outflow tract murmur and is complaining of dizziness and fatigue.  I suspect her aortic stenosis has progressed to severe but at her age with her comorbidities we will not continue to follow this.  She is not a candidate for any intervention.     Runell Gess MD FACP,FACC,FAHA, [facility] 03/27/2024 12:04 PM

## 2024-03-27 NOTE — Assessment & Plan Note (Signed)
 History of CAD status post coronary artery bypass grafting by Dr. Maren Beach in 1997.  She did have a non-STEMI in 07/08/2021 with troponins that rose to 5000 but did not undergo cardiac catheterization at that time.  She currently denies chest pain or shortness of breath.

## 2024-03-27 NOTE — Assessment & Plan Note (Signed)
 History of essential hypertension with blood pressure measured today at 90/50.  She is on amlodipine and metoprolol.  I am going to stop her amlodipine today.

## 2024-03-27 NOTE — Assessment & Plan Note (Signed)
 History of dyslipidemia on atorvastatin with lipid profile performed 01/06/2024 revealing total cholesterol 151, LDL 84 and HDL 48.

## 2024-03-27 NOTE — Assessment & Plan Note (Signed)
 History of at least moderate aortic stenosis by 2D echo performed 07/09/2021 with a valve area of 1 cm.  She has not had an echo since.  She has a harsh outflow tract murmur and is complaining of dizziness and fatigue.  I suspect her aortic stenosis has progressed to severe but at her age with her comorbidities we will not continue to follow this.  She is not a candidate for any intervention.

## 2024-03-28 LAB — HEPATIC FUNCTION PANEL
ALT: 18 U/L (ref 7–35)
AST: 23 (ref 13–35)
Alkaline Phosphatase: 168 — AB (ref 25–125)
Bilirubin, Direct: 0.1
Bilirubin, Total: 0.5

## 2024-03-28 LAB — COMPREHENSIVE METABOLIC PANEL WITH GFR
Albumin: 3.4 — AB (ref 3.5–5.0)
Globulin: 2.9

## 2024-04-04 DIAGNOSIS — L814 Other melanin hyperpigmentation: Secondary | ICD-10-CM | POA: Diagnosis not present

## 2024-04-04 DIAGNOSIS — L821 Other seborrheic keratosis: Secondary | ICD-10-CM | POA: Diagnosis not present

## 2024-04-04 DIAGNOSIS — L570 Actinic keratosis: Secondary | ICD-10-CM | POA: Diagnosis not present

## 2024-04-04 DIAGNOSIS — S50811A Abrasion of right forearm, initial encounter: Secondary | ICD-10-CM | POA: Diagnosis not present

## 2024-04-07 ENCOUNTER — Non-Acute Institutional Stay (SKILLED_NURSING_FACILITY): Payer: Self-pay | Admitting: Nurse Practitioner

## 2024-04-07 ENCOUNTER — Encounter: Payer: Self-pay | Admitting: Nurse Practitioner

## 2024-04-07 DIAGNOSIS — N952 Postmenopausal atrophic vaginitis: Secondary | ICD-10-CM | POA: Diagnosis not present

## 2024-04-07 DIAGNOSIS — F32A Depression, unspecified: Secondary | ICD-10-CM | POA: Diagnosis not present

## 2024-04-07 DIAGNOSIS — B9689 Other specified bacterial agents as the cause of diseases classified elsewhere: Secondary | ICD-10-CM

## 2024-04-07 DIAGNOSIS — I2510 Atherosclerotic heart disease of native coronary artery without angina pectoris: Secondary | ICD-10-CM | POA: Diagnosis not present

## 2024-04-07 DIAGNOSIS — D649 Anemia, unspecified: Secondary | ICD-10-CM | POA: Diagnosis not present

## 2024-04-07 DIAGNOSIS — M1990 Unspecified osteoarthritis, unspecified site: Secondary | ICD-10-CM | POA: Diagnosis not present

## 2024-04-07 DIAGNOSIS — I10 Essential (primary) hypertension: Secondary | ICD-10-CM

## 2024-04-07 DIAGNOSIS — N760 Acute vaginitis: Secondary | ICD-10-CM | POA: Diagnosis not present

## 2024-04-07 DIAGNOSIS — E039 Hypothyroidism, unspecified: Secondary | ICD-10-CM

## 2024-04-07 NOTE — Assessment & Plan Note (Signed)
 Blood pressure is controlled,  taking Metoprolol, Bun/creat 17/0.7 03/21/24

## 2024-04-07 NOTE — Assessment & Plan Note (Signed)
 The right lower back pain has been persisted, Norco is effective.

## 2024-04-07 NOTE — Assessment & Plan Note (Signed)
 TSH 6.214 03/07/24, on Levothyroxine

## 2024-04-07 NOTE — Assessment & Plan Note (Signed)
 Chronic Estrace

## 2024-04-07 NOTE — Assessment & Plan Note (Signed)
 Stable, CAD/aortic stenosis mild,  hx of CABG, dc'd Plavix, continue ASA, Atorvastatin

## 2024-04-07 NOTE — Assessment & Plan Note (Signed)
 reported green mucous odorous vaginal discharge. No vaginal discharge or fish odor noted upon my examination. The patient denied urogenital area itching, irritation, or discomfort, denied burning sensation upon urination.  Risk for BV in setting of incontinent of urine and dependent of person hygiene Will empirical tx: Metronidazole vaginal gel 1.3% 65mg  PV hs x 5 day.

## 2024-04-07 NOTE — Progress Notes (Signed)
 Location:   [facility] Room Number: 30 Place of Service:  SNF (31) Provider: Arna Snipe Sandralee Tarkington NP  Ichelle Harral X, NP  Patient Care Team: Sofi Bryars X, NP as PCP - General (Internal Medicine) Runell Gess, MD as PCP - Cardiology (Cardiology) Janalyn Harder, MD (Inactive) as Consulting Physician (Dermatology)  Extended Emergency Contact Information Primary Emergency Contact: [name],[name] Address: [address]          [place], [place] [address] [place] Home Phone: [phone] Mobile Phone: [phone] Relation: Daughter Secondary Emergency Contact: [name] [place] of [place] Home Phone: [phone] Mobile Phone: [phone] Relation: Daughter  Code Status: DNR Goals of care: Advanced Directive information    03/03/2024    1:55 PM  Advanced Directives  Does Patient Have a Medical Advance Directive? No  Would patient like information on creating a medical advance directive? No - Patient declined     Chief Complaint  Patient presents with   Acute Visit    Vaginal discharge.     HPI:  Pt is a 89 y.o. female seen today for an acute visit for reported green mucous odorous vaginal discharge. No vaginal discharge or fish odor noted upon my examination. The patient denied urogenital area itching, irritation, or discomfort, denied burning sensation upon urination.      The right lower back pain has been persisted, Norco is effective.   Anxiety/depression/lower back pain, on Cymbalta              Hospitalized 03/03/24-03/07/24 for generalized weakness/LLE pain and weakness/numbness,  UTI-completed 4 more days of Cefdinir in SNF, T3 fx,  f/u neurosurgeon             Severe spinal cord compression at C5-C6 with multi level spondylosis, T3 fx, contributory for falls, evaluated by Neurosurgery-not a surgical candidate             LLE weakness, not pain but some numbness.              Hyponatremia, 135 03/21/24              The left knee  pain/weakness/numbness, bruise(Xray L tibia/fibula negative fx), skin tear left elbow-healing, hematoma left forehead sustained from fall 02/26/24(CT head/cervical spine unremarkable)-healing. ED eval 02/27/24.              Negative for fx or displacement  X-ray pelvis 12/07/23, lower back pain is managed on Norco-better.              CT cervical spine 02/27/24, Age indeterminate superior endplate compression fracture of T3. Pending HPOA decision: f/u neurosurgery.               HTN, taking Metoprolol, Bun/creat 17/0.7 03/21/24             CAD/aortic stenosis mild,  hx of CABG, dc'd Plavix, continue ASA, Atorvastatin             Vit D deficiency, on Vit D             Atrophic vaginitis, taking Estrace vaginal cream.              Anemia, Iron 25, Vit B12 325 01/25/24, placed on Fe, Hgb 9.0 03/14/24, gluteal hematoma, s/p transfused,  avoid plavix             Hypokalemia, K 4.1 03/21/24             T2DM, Hgb A1c 6.5 01/06/24, diet controlled.  Edema BLE, chronic, minimal Hypothyroidism, TSH 6.214 03/07/24, on Levothyroxine HLD LDL 84 01/06/24, on Atorvastatin.              Urinary frequency, chronic    Past Medical History:  Diagnosis Date   BCC (basal cell carcinoma of skin) 01/09/2009   Lower Central Back (tx p bx)   CAD (coronary artery disease)    possible ant wall MI ('97) - cath & CABG x5   Exogenous obesity    GERD (gastroesophageal reflux disease)    History of nuclear stress test 09/2012   lexiscan; mild perfusion defect in apical anterior & apical region (infarct/scar) - no significant ischemia, low risk    History of total bilateral knee replacement    Hypertension    Hypothyroidism    Nodular basal cell carcinoma (BCC) 08/17/2017   Left Calf (tx p bx)   PVC's (premature ventricular contractions)    SCC (squamous cell carcinoma) [place] 01/29/2004   Right Forearm (Cx3,5FU)   SCCA (squamous cell carcinoma) of skin 03/13/2005   Mid Upper Back   SCCA (squamous cell carcinoma) of  skin 09/07/2005   Left Elbow(Cx3,5FU)   SCCA (squamous cell carcinoma) of skin 01/25/2006   Left Brow(in situ) (Cx3,5FU)   SCCA (squamous cell carcinoma) of skin 04/27/2006   Inner Left Shoulder(Keratoacanthoma) (Exc.)   SCCA (squamous cell carcinoma) of skin 03/22/2007   Left Temple(in situ) and Bridge of Nose(in situ) (Cs3,5FU)   SCCA (squamous cell carcinoma) of skin 05/12/2007   Top of Scalp(Cx3,5FU)   SCCA (squamous cell carcinoma) of skin 01/28/2011   Right Upper Arm(in situ)   SCCA (squamous cell carcinoma) of skin 07/26/2012   Glabella, Inferior Tip (Cx3,5FU)   SCCA (squamous cell carcinoma) of skin 03/08/2013   Left Front Scalp(in situ) and Upper Nose(in situ) (Cx3,5FU)   SCCA (squamous cell carcinoma) of skin 07/24/2013   Right Lower Leg(Keratoacanthoma)   SCCA (squamous cell carcinoma) of skin 09/11/2015   Left Scalp(in situ) (Cx3,5FU)   SCCA (squamous cell carcinoma) of skin 01/15/2016   Left Forearm(in situ) and Left Temple(in situ) (tx p bx)   SCCA (squamous cell carcinoma) of skin 10/01/2016   Left Mid Forearm(in situ)(tx p bx) and Left Front Scalp(watch)   SCCA (squamous cell carcinoma) of skin 11/12/2016   Left Temple(Keratoacanthoma) (watch)   SCCA (squamous cell carcinoma) of skin 02/11/2017   Top Left Hand(Keratoacanthoma) (tx p bx)   Squamous cell carcinoma in situ (SCCIS) 11/18/1999   Above Left Outer Eyebrow   Squamous cell carcinoma of scalp    removed - Dr. Jorja Loa   Superficial basal cell carcinoma (BCC) 07/14/2004   Left Scapula (Cx3,5FU)   Past Surgical History:  Procedure Laterality Date   ABDOMINAL HYSTERECTOMY  1984   APPENDECTOMY  1941   Carotid Doppler  12/07/2012   R & L ICA - 0-49% diameter reduction   CORONARY ARTERY BYPASS GRAFT  09/1996   cath & CABG x5 LIMA-LAD, SVG-sequential OD & OM, SVG sequential to PDA & PLA (Dr. Donata Clay)   REPLACEMENT TOTAL KNEE Bilateral 2001 & 2003   TONSILLECTOMY     TRANSTHORACIC ECHOCARDIOGRAM  08/23/2013    EF 50-55%, LV cavity size mod reduced, mild LVH, mild conc hypertrophy; mild AV stenosis & mild regurg; mild MR - ordered for murmur    Allergies  Allergen Reactions   Mobic [Meloxicam] Other (See Comments)    Unknown reaction   Novocain [Procaine] Other (See Comments)    Unknown reaction  Allergies as of 04/07/2024       Reactions   Mobic [meloxicam] Other (See Comments)   Unknown reaction   Novocain [procaine] Other (See Comments)   Unknown reaction        Medication List        Accurate as of April 07, 2024  3:50 PM. If you have any questions, ask your nurse or doctor.          acetaminophen 325 MG tablet Commonly known as: TYLENOL Take 500 mg by mouth 3 (three) times daily. Give 1000 mg by mouth by mouth three times a day for pain.   Aspirin 81 MG Caps Take 81 mg by mouth daily.   atorvastatin 40 MG tablet Commonly known as: LIPITOR TAKE 1 TABLET BY MOUTH EVERY DAY   Baclofen 5 MG Tabs Take 5 mg by mouth once. Give 5 mg by mouth one time a day   cholecalciferol 25 MCG (1000 UNIT) tablet Commonly known as: VITAMIN D3 Take 1,000 Units by mouth daily.   diclofenac Sodium 1 % Gel Commonly known as: VOLTAREN Apply 2 g topically 4 (four) times daily.   DULoxetine 60 MG capsule Commonly known as: CYMBALTA Take 60 mg by mouth daily.   estradiol 0.1 MG/GM vaginal cream Commonly known as: ESTRACE Place 1 Applicatorful vaginally See admin instructions. Apply 1g vaginally in the evening on Monday, Wednesday, Friday.   ferrous sulfate 325 (65 FE) MG EC tablet Take 325 mg by mouth every Monday, Wednesday, and Friday.   HYDROcodone-acetaminophen 5-325 MG tablet Commonly known as: NORCO/VICODIN Take 0.5 tablets by mouth every 6 (six) hours as needed for moderate pain (pain score 4-6).   hydrocortisone 2.5 % cream Apply 1 Application topically at bedtime.   levothyroxine 75 MCG tablet Commonly known as: SYNTHROID Take 75 mcg by mouth daily before  breakfast.   lidocaine 4 % Place 1 patch onto the skin daily. Apply patch at 0800 and remove patch at at 2000   methocarbamol 500 MG tablet Commonly known as: ROBAXIN Take 1 tablet (500 mg total) by mouth every 8 (eight) hours as needed for muscle spasms.   metoprolol succinate 50 MG 24 hr tablet Commonly known as: TOPROL-XL Take 1 tablet (50 mg total) by mouth daily. Take with or immediately following a meal.   multivitamin with minerals Tabs tablet Take 1 tablet by mouth daily.   mupirocin ointment 2 % Commonly known as: BACTROBAN Apply 1 Application topically daily as needed (irritation).   nitroGLYCERIN 0.4 MG SL tablet Commonly known as: NITROSTAT Place 0.4 mg under the tongue every 5 (five) minutes as needed for chest pain.   polyethylene glycol 17 g packet Commonly known as: MIRALAX / GLYCOLAX Take 17 g by mouth See admin instructions. Every Mon, Wed, Friday for constipation. Mix with 4 ounces of fluid.   potassium chloride 10 MEQ tablet Commonly known as: KLOR-CON Take 10 mEq by mouth daily as needed.        Review of Systems  Constitutional:  Negative for appetite change, fatigue and fever.  HENT:  Positive for hearing loss. Negative for congestion and trouble swallowing.   Eyes:  Negative for visual disturbance.  Respiratory:  Negative for cough, chest tightness and wheezing.   Cardiovascular:  Positive for leg swelling.  Gastrointestinal:  Negative for abdominal pain and constipation.  Genitourinary:  Positive for frequency and vaginal discharge. Negative for dysuria, urgency, vaginal bleeding and vaginal pain.       Incontinent of urine.  Musculoskeletal:  Positive for arthralgias, back pain and gait problem.       R lower back pain, R+L knee pain  Skin:  Negative for color change.  Neurological:  Positive for weakness and numbness. Negative for speech difficulty and headaches.       Lower legs weakness L>R  Psychiatric/Behavioral:  Positive for  dysphoric mood. Negative for confusion and sleep disturbance. The patient is not nervous/anxious.     Immunization History  Administered Date(s) Administered   Fluad Quad(high Dose 65+) 08/30/2019   Influenza, High Dose Seasonal PF 10/10/2018, 09/29/2023   Influenza,inj,Quad PF,6-35 Mos 10/13/2022   Influenza,inj,quad, With Preservative 08/30/2019   Moderna Covid-19 Vaccine Bivalent Booster 68yrs & up 10/29/2022   Pneumococcal Conjugate-13 01/28/2013, 10/17/2014   Pneumococcal-Unspecified 08/19/2010   Td (Adult) 09/27/2022   Unspecified SARS-COV-2 Vaccination 01/01/2020, 09/16/2021   Pertinent  Health Maintenance Due  Topic Date Due   FOOT EXAM  Never done   OPHTHALMOLOGY EXAM  Never done   HEMOGLOBIN A1C  07/05/2024   INFLUENZA VACCINE  07/28/2024   DEXA SCAN  Completed      07/09/2021   10:00 PM 07/10/2021    8:53 AM 07/10/2021    9:00 PM 07/11/2021    8:40 AM 07/21/2021   10:02 AM  Fall Risk  (RETIRED) Patient Fall Risk Level Moderate fall risk Moderate fall risk Moderate fall risk Moderate fall risk Low fall risk   Functional Status Survey:    Vitals:   04/07/24 1535  BP: 136/63  Pulse: 79  Resp: 18  Temp: (!) 97.4 F (36.3 C)  SpO2: 96%  Weight: 162 lb 12.8 oz (73.8 kg)   Body mass index is 27.94 kg/m. Physical Exam Vitals and nursing note reviewed.  Constitutional:      Appearance: Normal appearance.  HENT:     Head: Normocephalic and atraumatic.     Nose: Nose normal.     Mouth/Throat:     Mouth: Mucous membranes are moist.  Eyes:     Extraocular Movements: Extraocular movements intact.     Conjunctiva/sclera: Conjunctivae normal.     Pupils: Pupils are equal, round, and reactive to light.  Cardiovascular:     Rate and Rhythm: Normal rate and regular rhythm.     Heart sounds: Murmur heard.  Pulmonary:     Effort: Pulmonary effort is normal.     Breath sounds: No rales.  Abdominal:     General: Bowel sounds are normal.     Palpations: Abdomen is  soft.     Tenderness: There is no abdominal tenderness.  Genitourinary:    General: Normal vulva.     Vagina: No vaginal discharge.  Musculoskeletal:        General: Tenderness present.     Cervical back: Normal range of motion and neck supple.     Right lower leg: Edema present.     Left lower leg: Edema present.     Comments: R SIJ tenderness palpated, with movement, better on heating pad Trace edema BLE knee pain R+L Numbness in feet, mostly left, not pain, able to move, muscle strength 5/5 LLE and RLE Weakness in BLE L>R unable to walk  Skin:    General: Skin is warm and dry.     Findings: No bruising.  Neurological:     General: No focal deficit present.     Mental Status: She is alert and oriented to person, place, and time. Mental status is at baseline.     Motor:  Weakness present.     Coordination: Coordination normal.     Gait: Gait abnormal.     Comments: Weakness BLE L>R, muscle strength 5/5  Psychiatric:        Mood and Affect: Mood normal.        Behavior: Behavior normal.        Thought Content: Thought content normal.     Labs reviewed: Recent Labs    03/05/24 1817 03/06/24 0927 03/07/24 0612 03/14/24 0000 03/21/24 0000  NA 137 138 135 137 135*  K 4.0 3.8 3.8 4.5 4.1  CL 106 109 110 104 100  CO2 17* 22 21* 26* 26*  GLUCOSE 143* 137* 101*  --   --   BUN 27* 21 25* 18 17  CREATININE 1.09* 1.27* 1.08* 0.7 0.7  CALCIUM 8.7* 8.5* 8.0* 8.5* 8.6*  MG 1.9 1.8 1.8  --   --   PHOS 4.1 4.0 3.7  --   --    Recent Labs    03/05/24 1817 03/06/24 0927 03/07/24 0612 03/14/24 0000  AST 39 30 26 37*  ALT 26 24 23 23   ALKPHOS 165* 165* 162* 219*  BILITOT 0.8 0.8 0.6  --   PROT 6.3* 6.2* 5.7*  --   ALBUMIN 2.4* 2.3* 2.3* 3.2*   Recent Labs    03/05/24 1817 03/06/24 0927 03/07/24 0612 03/14/24 0000  WBC 7.7 6.5 5.9 6.0  NEUTROABS 3.9 3.6 3.0  --   HGB 9.6* 9.2* 9.0* 9.0*  HCT 30.3* 29.1* 27.9* 28*  MCV 101.7* 99.3 100.0  --   PLT 233 253 233 241    Lab Results  Component Value Date   TSH 6.214 (H) 03/07/2024   Lab Results  Component Value Date   HGBA1C 6.5 01/06/2024   Lab Results  Component Value Date   CHOL 151 01/06/2024   HDL 48 01/06/2024   LDLCALC 84 01/06/2024   TRIG 101 01/06/2024   CHOLHDL 3.0 07/09/2021    Significant Diagnostic Results in last 30 days:  No results found.  Assessment/Plan: Bacterial vaginitis reported green mucous odorous vaginal discharge. No vaginal discharge or fish odor noted upon my examination. The patient denied urogenital area itching, irritation, or discomfort, denied burning sensation upon urination.  Risk for BV in setting of incontinent of urine and dependent of person hygiene Will empirical tx: Metronidazole vaginal gel 1.3% 65mg  PV hs x 5 day.   Osteoarthritis  The right lower back pain has been persisted, Norco is effective.   Depression Anxiety/depression/lower back pain, on Cymbalta  HTN (hypertension) Blood pressure is controlled,  taking Metoprolol, Bun/creat 17/0.7 03/21/24  CAD (coronary artery disease) Stable, CAD/aortic stenosis mild,  hx of CABG, dc'd Plavix, continue ASA, Atorvastatin  Anemia  Iron 25, Vit B12 325 01/25/24, placed on Fe, Hgb 9.0 03/14/24, gluteal hematoma, s/p transfused,  avoid plavix  Hypothyroidism  TSH 6.214 03/07/24, on Levothyroxine   Atrophic vaginitis Chronic Estrace    Family/ staff Communication: plan of care reviewed with the patient and charge nurse.   Labs/tests ordered:  none

## 2024-04-07 NOTE — Assessment & Plan Note (Signed)
 Anxiety/depression/lower back pain, on Cymbalta

## 2024-04-07 NOTE — Assessment & Plan Note (Signed)
 Iron 25, Vit B12 325 01/25/24, placed on Fe, Hgb 9.0 03/14/24, gluteal hematoma, s/p transfused,  avoid plavix

## 2024-04-13 ENCOUNTER — Non-Acute Institutional Stay (SKILLED_NURSING_FACILITY): Admitting: Nurse Practitioner

## 2024-04-13 ENCOUNTER — Encounter: Payer: Self-pay | Admitting: Nurse Practitioner

## 2024-04-13 ENCOUNTER — Emergency Department (HOSPITAL_COMMUNITY)

## 2024-04-13 ENCOUNTER — Emergency Department (HOSPITAL_COMMUNITY)
Admission: EM | Admit: 2024-04-13 | Discharge: 2024-04-14 | Disposition: A | Discharge status: Home / Self Care | Attending: Emergency Medicine | Admitting: Emergency Medicine

## 2024-04-13 DIAGNOSIS — K828 Other specified diseases of gallbladder: Secondary | ICD-10-CM | POA: Diagnosis not present

## 2024-04-13 DIAGNOSIS — K921 Melena: Secondary | ICD-10-CM

## 2024-04-13 DIAGNOSIS — G8929 Other chronic pain: Secondary | ICD-10-CM

## 2024-04-13 DIAGNOSIS — E119 Type 2 diabetes mellitus without complications: Secondary | ICD-10-CM | POA: Insufficient documentation

## 2024-04-13 DIAGNOSIS — Z79899 Other long term (current) drug therapy: Secondary | ICD-10-CM | POA: Insufficient documentation

## 2024-04-13 DIAGNOSIS — E039 Hypothyroidism, unspecified: Secondary | ICD-10-CM | POA: Insufficient documentation

## 2024-04-13 DIAGNOSIS — I10 Essential (primary) hypertension: Secondary | ICD-10-CM

## 2024-04-13 DIAGNOSIS — N3001 Acute cystitis with hematuria: Secondary | ICD-10-CM | POA: Diagnosis not present

## 2024-04-13 DIAGNOSIS — K625 Hemorrhage of anus and rectum: Secondary | ICD-10-CM | POA: Diagnosis present

## 2024-04-13 DIAGNOSIS — Z66 Do not resuscitate: Secondary | ICD-10-CM | POA: Diagnosis not present

## 2024-04-13 DIAGNOSIS — R109 Unspecified abdominal pain: Secondary | ICD-10-CM | POA: Diagnosis not present

## 2024-04-13 DIAGNOSIS — K8689 Other specified diseases of pancreas: Secondary | ICD-10-CM | POA: Diagnosis not present

## 2024-04-13 DIAGNOSIS — D649 Anemia, unspecified: Secondary | ICD-10-CM

## 2024-04-13 DIAGNOSIS — I2510 Atherosclerotic heart disease of native coronary artery without angina pectoris: Secondary | ICD-10-CM | POA: Diagnosis not present

## 2024-04-13 DIAGNOSIS — F32A Depression, unspecified: Secondary | ICD-10-CM

## 2024-04-13 DIAGNOSIS — M545 Low back pain, unspecified: Secondary | ICD-10-CM

## 2024-04-13 DIAGNOSIS — K922 Gastrointestinal hemorrhage, unspecified: Secondary | ICD-10-CM | POA: Insufficient documentation

## 2024-04-13 DIAGNOSIS — R58 Hemorrhage, not elsewhere classified: Secondary | ICD-10-CM | POA: Diagnosis not present

## 2024-04-13 DIAGNOSIS — I959 Hypotension, unspecified: Secondary | ICD-10-CM | POA: Diagnosis not present

## 2024-04-13 DIAGNOSIS — Z7982 Long term (current) use of aspirin: Secondary | ICD-10-CM | POA: Insufficient documentation

## 2024-04-13 DIAGNOSIS — R748 Abnormal levels of other serum enzymes: Secondary | ICD-10-CM

## 2024-04-13 DIAGNOSIS — K5730 Diverticulosis of large intestine without perforation or abscess without bleeding: Secondary | ICD-10-CM | POA: Diagnosis not present

## 2024-04-13 LAB — CBC WITH DIFFERENTIAL/PLATELET
Abs Immature Granulocytes: 0.05 10*3/uL (ref 0.00–0.07)
Basophils Absolute: 0 10*3/uL (ref 0.0–0.1)
Basophils Relative: 1 %
Eosinophils Absolute: 0.1 10*3/uL (ref 0.0–0.5)
Eosinophils Relative: 1 %
HCT: 33.5 % — ABNORMAL LOW (ref 36.0–46.0)
Hemoglobin: 10.7 g/dL — ABNORMAL LOW (ref 12.0–15.0)
Immature Granulocytes: 1 %
Lymphocytes Relative: 27 %
Lymphs Abs: 2.4 10*3/uL (ref 0.7–4.0)
MCH: 32.9 pg (ref 26.0–34.0)
MCHC: 31.9 g/dL (ref 30.0–36.0)
MCV: 103.1 fL — ABNORMAL HIGH (ref 80.0–100.0)
Monocytes Absolute: 0.9 10*3/uL (ref 0.1–1.0)
Monocytes Relative: 11 %
Neutro Abs: 5.3 10*3/uL (ref 1.7–7.7)
Neutrophils Relative %: 59 %
Platelets: 230 10*3/uL (ref 150–400)
RBC: 3.25 MIL/uL — ABNORMAL LOW (ref 3.87–5.11)
RDW: 16.2 % — ABNORMAL HIGH (ref 11.5–15.5)
WBC: 8.8 10*3/uL (ref 4.0–10.5)
nRBC: 0 % (ref 0.0–0.2)

## 2024-04-13 LAB — TYPE AND SCREEN
ABO/RH(D): A POS
Antibody Screen: NEGATIVE

## 2024-04-13 LAB — COMPREHENSIVE METABOLIC PANEL WITH GFR
ALT: 49 U/L — ABNORMAL HIGH (ref 0–44)
AST: 61 U/L — ABNORMAL HIGH (ref 15–41)
Albumin: 2.4 g/dL — ABNORMAL LOW (ref 3.5–5.0)
Alkaline Phosphatase: 245 U/L — ABNORMAL HIGH (ref 38–126)
Anion gap: 7 (ref 5–15)
BUN: 28 mg/dL — ABNORMAL HIGH (ref 8–23)
CO2: 24 mmol/L (ref 22–32)
Calcium: 8.8 mg/dL — ABNORMAL LOW (ref 8.9–10.3)
Chloride: 106 mmol/L (ref 98–111)
Creatinine, Ser: 0.92 mg/dL (ref 0.44–1.00)
GFR, Estimated: 58 mL/min — ABNORMAL LOW (ref >60)
Glucose, Bld: 121 mg/dL — ABNORMAL HIGH (ref 70–99)
Potassium: 3.7 mmol/L (ref 3.5–5.1)
Sodium: 137 mmol/L (ref 135–145)
Total Bilirubin: 0.7 mg/dL (ref 0.0–1.2)
Total Protein: 6.5 g/dL (ref 6.5–8.1)

## 2024-04-13 LAB — URINALYSIS, W/ REFLEX TO CULTURE (INFECTION SUSPECTED)
Bilirubin Urine: NEGATIVE
Glucose, UA: NEGATIVE mg/dL
Ketones, ur: NEGATIVE mg/dL
Nitrite: NEGATIVE
Protein, ur: 100 mg/dL — AB
RBC / HPF: >50 RBC/hpf (ref 0–5)
Specific Gravity, Urine: 1.02 (ref 1.005–1.030)
WBC, UA: >50 WBC/hpf (ref 0–5)
pH: 7 (ref 5.0–8.0)

## 2024-04-13 LAB — POC OCCULT BLOOD, ED: Fecal Occult Bld: NEGATIVE

## 2024-04-13 MED ORDER — CEPHALEXIN 500 MG PO CAPS
500.0000 mg | ORAL_CAPSULE | Freq: Once | ORAL | Status: AC
  Administered 2024-04-13: 500 mg via ORAL
  Filled 2024-04-13: qty 1

## 2024-04-13 MED ORDER — IOHEXOL 300 MG/ML  SOLN
80.0000 mL | Freq: Once | INTRAMUSCULAR | Status: AC | PRN
  Administered 2024-04-13: 80 mL via INTRAVENOUS

## 2024-04-13 MED ORDER — SODIUM CHLORIDE (PF) 0.9 % IJ SOLN
INTRAMUSCULAR | Status: AC
  Filled 2024-04-13: qty 50

## 2024-04-13 MED ORDER — CEPHALEXIN 500 MG PO CAPS
500.0000 mg | ORAL_CAPSULE | Freq: Four times a day (QID) | ORAL | 0 refills | Status: AC
Start: 2024-04-13 — End: 2024-04-20

## 2024-04-13 MED ORDER — CEPHALEXIN 500 MG PO CAPS: 500.0000 mg | ORAL_CAPSULE | Freq: Four times a day (QID) | ORAL | 0 refills | Status: DC

## 2024-04-13 NOTE — Discharge Instructions (Addendum)
 Your imaging and labs are reassuring. You have a UTI and we are giving you a paper prescription for Keflex to take for the next week. The antibiotics takes about 2-3 days to kick in. The urine is also being sent for culture. You will be called by the hospital if you need to be switched to different antibiotics. If you do not hear from us , continue Keflex.  Follow up with your PCP in the next 3-5 days for reevaluation. Your kidney function and liver enzymes were more elevated than you baseline, this may be due to having the CT and contrast dye prior to the CMP being done. Recheck these levels with your primary care in the next several days.  Get help right away if: You have very bad pain in your back or lower belly. You faint.

## 2024-04-13 NOTE — Assessment & Plan Note (Signed)
 CAD/aortic stenosis mild,  hx of CABG, dc'd Plavix, hold ASA until tarry stools resolved, continue Atorvastatin

## 2024-04-13 NOTE — Assessment & Plan Note (Signed)
 TSH 6.214 03/07/24, on Levothyroxine

## 2024-04-13 NOTE — Progress Notes (Signed)
 Location:   [facility] Room Number: 30 Place of Service:  SNF (31) Provider: Arna Snipe Dhani Imel NP  Preciosa Bundrick X, NP  Patient Care Team: Ebonique Hallstrom X, NP as PCP - General (Internal Medicine) Runell Gess, MD as PCP - Cardiology (Cardiology) Janalyn Harder, MD (Inactive) as Consulting Physician (Dermatology)  Extended Emergency Contact Information Primary Emergency Contact: [name],[name] Address: [address]          [place], [place] [address] [place] Home Phone: [phone] Mobile Phone: [phone] Relation: Daughter Secondary Emergency Contact: [name] [place] Home Phone: [phone] Mobile Phone: [phone] Relation: Daughter  Code Status:  DNR Goals of care: Advanced Directive information    03/03/2024    1:55 PM  Advanced Directives  Does Patient Have a Medical Advance Directive? No  Would patient like information on creating a medical advance directive? No - Patient declined     Chief Complaint  Patient presents with   Medical Management of Chronic Issues    Routine visit. Discuss need for pneumonia vaccine, td/tdap, covid booster, foot exam, eye exam, and shingrix     HPI:  Pt is a 89 y.o. female seen today for tarry stool  Reported the patient has blood clot in her brief when providing incontinent care this morning.  Noted tarry stool with a distinct odor and hardened are in umbilical area upon my examination, denied nausea, vomiting, abd pain. Taking ASA 81mg , Tylenol 1000mg  tid, and Fe currently. Hx of hemorrhoids.     PV completed 5 days vaginal Metronidazole.   The right lower back pain has been persisted, Norco is effective.              Anxiety/depression/lower back pain, on Cymbalta              Hospitalized 03/03/24-03/07/24 for generalized weakness/LLE pain and weakness/numbness,  UTI-completed 4 more days of Cefdinir in SNF, T3 fx,  f/u neurosurgeon             Severe spinal cord compression  at C5-C6 with multi level spondylosis, T3 fx, contributory for falls, evaluated by Neurosurgery-not a surgical candidate             LLE weakness, not pain but some numbness.              Hyponatremia, 135 03/21/24              The left knee pain/weakness/numbness, bruise(Xray L tibia/fibula negative fx), skin tear left elbow-healing, hematoma left forehead sustained from fall 02/26/24(CT head/cervical spine unremarkable)-healing. ED eval 02/27/24.              Negative for fx or displacement  X-ray pelvis 12/07/23, lower back pain is managed on Norco-better.              CT cervical spine 02/27/24, Age indeterminate superior endplate compression fracture of T3. Pending HPOA decision: f/u neurosurgery.               HTN, taking Metoprolol, Bun/creat 17/0.7 03/21/24             CAD/aortic stenosis mild,  hx of CABG, dc'd Plavix, continue ASA, Atorvastatin             Vit D deficiency, on Vit D             Atrophic vaginitis, taking Estrace vaginal cream.              Anemia,  Iron 25, Vit B12 325 01/25/24, placed on Fe, Hgb 9.0 03/14/24, gluteal hematoma, s/p transfused             Hypokalemia, K 4.1 03/21/24             T2DM, Hgb A1c 6.5 01/06/24, diet controlled.              Edema BLE, chronic, minimal Hypothyroidism, TSH 6.214 03/07/24, on Levothyroxine HLD LDL 84 01/06/24, on Atorvastatin.              Urinary frequency, chronic       Past Medical History:  Diagnosis Date   BCC (basal cell carcinoma of skin) 01/09/2009   Lower Central Back (tx p bx)   CAD (coronary artery disease)    possible ant wall MI ('97) - cath & CABG x5   Exogenous obesity    GERD (gastroesophageal reflux disease)    History of nuclear stress test 09/2012   lexiscan; mild perfusion defect in apical anterior & apical region (infarct/scar) - no significant ischemia, low risk    History of total bilateral knee replacement    Hypertension    Hypothyroidism    Nodular basal cell carcinoma (BCC) 08/17/2017   Left Calf (tx p bx)    PVC's (premature ventricular contractions)    SCC (squamous cell carcinoma) [place] 01/29/2004   Right Forearm (Cx3,5FU)   SCCA (squamous cell carcinoma) of skin 03/13/2005   Mid Upper Back   SCCA (squamous cell carcinoma) of skin 09/07/2005   Left Elbow(Cx3,5FU)   SCCA (squamous cell carcinoma) of skin 01/25/2006   Left Brow(in situ) (Cx3,5FU)   SCCA (squamous cell carcinoma) of skin 04/27/2006   Inner Left Shoulder(Keratoacanthoma) (Exc.)   SCCA (squamous cell carcinoma) of skin 03/22/2007   Left Temple(in situ) and Bridge of Nose(in situ) (Cs3,5FU)   SCCA (squamous cell carcinoma) of skin 05/12/2007   Top of Scalp(Cx3,5FU)   SCCA (squamous cell carcinoma) of skin 01/28/2011   Right Upper Arm(in situ)   SCCA (squamous cell carcinoma) of skin 07/26/2012   Glabella, Inferior Tip (Cx3,5FU)   SCCA (squamous cell carcinoma) of skin 03/08/2013   Left Front Scalp(in situ) and Upper Nose(in situ) (Cx3,5FU)   SCCA (squamous cell carcinoma) of skin 07/24/2013   Right Lower Leg(Keratoacanthoma)   SCCA (squamous cell carcinoma) of skin 09/11/2015   Left Scalp(in situ) (Cx3,5FU)   SCCA (squamous cell carcinoma) of skin 01/15/2016   Left Forearm(in situ) and Left Temple(in situ) (tx p bx)   SCCA (squamous cell carcinoma) of skin 10/01/2016   Left Mid Forearm(in situ)(tx p bx) and Left Front Scalp(watch)   SCCA (squamous cell carcinoma) of skin 11/12/2016   Left Temple(Keratoacanthoma) (watch)   SCCA (squamous cell carcinoma) of skin 02/11/2017   Top Left Hand(Keratoacanthoma) (tx p bx)   Squamous cell carcinoma in situ (SCCIS) 11/18/1999   Above Left Outer Eyebrow   Squamous cell carcinoma of scalp    removed - Dr. Jorja Loa   Superficial basal cell carcinoma (BCC) 07/14/2004   Left Scapula (Cx3,5FU)   Past Surgical History:  Procedure Laterality Date   ABDOMINAL HYSTERECTOMY  1984   APPENDECTOMY  1941   Carotid Doppler  12/07/2012   R & L ICA - 0-49% diameter reduction   CORONARY  ARTERY BYPASS GRAFT  09/1996   cath & CABG x5 LIMA-LAD, SVG-sequential OD & OM, SVG sequential to PDA & PLA (Dr. Donata Clay)   REPLACEMENT TOTAL KNEE Bilateral 2001 & 2003   TONSILLECTOMY  TRANSTHORACIC ECHOCARDIOGRAM  08/23/2013   EF 50-55%, LV cavity size mod reduced, mild LVH, mild conc hypertrophy; mild AV stenosis & mild regurg; mild MR - ordered for murmur    Allergies  Allergen Reactions   Mobic [Meloxicam] Other (See Comments)    Unknown reaction   Novocain [Procaine] Other (See Comments)    Unknown reaction    Allergies as of 04/13/2024       Reactions   Mobic [meloxicam] Other (See Comments)   Unknown reaction   Novocain [procaine] Other (See Comments)   Unknown reaction        Medication List        Accurate as of April 13, 2024 11:01 AM. If you have any questions, ask your nurse or doctor.          acetaminophen 325 MG tablet Commonly known as: TYLENOL Take 500 mg by mouth 3 (three) times daily.   Aspirin 81 MG Caps Take 81 mg by mouth daily.   atorvastatin 40 MG tablet Commonly known as: LIPITOR TAKE 1 TABLET BY MOUTH EVERY DAY   Baclofen 5 MG Tabs Take 5 mg by mouth daily.   cholecalciferol 25 MCG (1000 UNIT) tablet Commonly known as: VITAMIN D3 Take 1,000 Units by mouth daily.   diclofenac Sodium 1 % Gel Commonly known as: VOLTAREN Apply 2 g topically 4 (four) times daily.   DULoxetine 60 MG capsule Commonly known as: CYMBALTA Take 60 mg by mouth daily.   estradiol 0.1 MG/GM vaginal cream Commonly known as: ESTRACE Place 1 Applicatorful vaginally See admin instructions. Apply 1g vaginally in the evening on Monday, Wednesday, Friday.   ferrous sulfate 325 (65 FE) MG EC tablet Take 325 mg by mouth every Monday, Wednesday, and Friday.   fluticasone 50 MCG/ACT nasal spray Commonly known as: FLONASE Place 2 sprays into both nostrils daily.   HYDROcodone-acetaminophen 5-325 MG tablet Commonly known as: NORCO/VICODIN Take 0.5 tablets  by mouth every 6 (six) hours as needed for moderate pain (pain score 4-6).   hydrocortisone 2.5 % cream Apply 1 Application topically at bedtime.   levothyroxine 75 MCG tablet Commonly known as: SYNTHROID Take 75 mcg by mouth daily before breakfast.   lidocaine 4 % Place 1 patch onto the skin daily. Apply patch at 0800 and remove patch at at 2000   loratadine 10 MG tablet Commonly known as: CLARITIN Take 10 mg by mouth daily.   methocarbamol 500 MG tablet Commonly known as: ROBAXIN Take 1 tablet (500 mg total) by mouth every 8 (eight) hours as needed for muscle spasms.   metoprolol succinate 50 MG 24 hr tablet Commonly known as: TOPROL-XL Take 1 tablet (50 mg total) by mouth daily. Take with or immediately following a meal.   metroNIDAZOLE 1.3 % Gel Place 1 Application vaginally at bedtime.   multivitamin with minerals Tabs tablet Take 1 tablet by mouth daily.   mupirocin ointment 2 % Commonly known as: BACTROBAN Apply 1 Application topically daily as needed (irritation).   nitroGLYCERIN 0.4 MG SL tablet Commonly known as: NITROSTAT Place 0.4 mg under the tongue every 5 (five) minutes as needed for chest pain.   polyethylene glycol 17 g packet Commonly known as: MIRALAX / GLYCOLAX Take 17 g by mouth See admin instructions. Every Mon, Wed, Friday for constipation. Mix with 4 ounces of fluid.   potassium chloride 10 MEQ tablet Commonly known as: KLOR-CON Take 10 mEq by mouth daily as needed.        Review of  Systems  Constitutional:  Negative for appetite change, fatigue and fever.  HENT:  Positive for hearing loss. Negative for congestion and trouble swallowing.   Eyes:  Negative for visual disturbance.  Respiratory:  Negative for cough, chest tightness and wheezing.   Cardiovascular:  Positive for leg swelling.  Gastrointestinal:  Positive for blood in stool. Negative for abdominal pain, constipation, diarrhea, nausea and vomiting.  Genitourinary:  Positive  for frequency. Negative for dysuria, urgency, vaginal bleeding, vaginal discharge and vaginal pain.       Incontinent of urine.   Musculoskeletal:  Positive for arthralgias, back pain and gait problem.       R lower back pain, R+L knee pain  Skin:  Negative for color change.  Neurological:  Positive for weakness and numbness. Negative for speech difficulty and headaches.       Lower legs weakness L>R  Psychiatric/Behavioral:  Positive for dysphoric mood. Negative for confusion and sleep disturbance. The patient is not nervous/anxious.     Immunization History  Administered Date(s) Administered   Fluad Quad(high Dose 65+) 08/30/2019   Influenza, High Dose Seasonal PF 10/10/2018, 09/29/2023   Influenza,inj,Quad PF,6-35 Mos 10/13/2022   Influenza,inj,quad, With Preservative 08/30/2019   Moderna Covid-19 Vaccine Bivalent Booster 20yrs & up 10/29/2022   PNEUMOCOCCAL CONJUGATE-20 01/27/2024   Pneumococcal Conjugate-13 01/28/2013, 10/17/2014   Pneumococcal-Unspecified 08/19/2010   Td (Adult) 09/27/2022   Unspecified SARS-COV-2 Vaccination 01/01/2020, 09/16/2021   Pertinent  Health Maintenance Due  Topic Date Due   FOOT EXAM  Never done   OPHTHALMOLOGY EXAM  Never done   HEMOGLOBIN A1C  07/05/2024   INFLUENZA VACCINE  07/28/2024   DEXA SCAN  Completed      07/09/2021   10:00 PM 07/10/2021    8:53 AM 07/10/2021    9:00 PM 07/11/2021    8:40 AM 07/21/2021   10:02 AM  Fall Risk  (RETIRED) Patient Fall Risk Level Moderate fall risk Moderate fall risk Moderate fall risk Moderate fall risk Low fall risk   Functional Status Survey:    Vitals:   04/13/24 1035 04/13/24 1041  BP: (!) 147/68 123/61  Pulse: 79   Resp: 18   Temp: (!) 97.4 F (36.3 C)   SpO2: 96%   Weight: 162 lb 12.8 oz (73.8 kg)    Body mass index is 27.94 kg/m. Physical Exam Vitals and nursing note reviewed.  Constitutional:      Appearance: Normal appearance.  HENT:     Head: Normocephalic and atraumatic.      Nose: Nose normal.     Mouth/Throat:     Mouth: Mucous membranes are moist.  Eyes:     Extraocular Movements: Extraocular movements intact.     Conjunctiva/sclera: Conjunctivae normal.     Pupils: Pupils are equal, round, and reactive to light.  Cardiovascular:     Rate and Rhythm: Normal rate and regular rhythm.     Heart sounds: Murmur heard.  Pulmonary:     Effort: Pulmonary effort is normal.     Breath sounds: No rales.  Abdominal:     General: Bowel sounds are normal.     Palpations: Abdomen is soft.     Tenderness: There is no abdominal tenderness. There is no right CVA tenderness, left CVA tenderness, guarding or rebound.     Comments: Tarry stools with distinct odor.  Hardened area in umbilical area.   Musculoskeletal:        General: Tenderness present.     Cervical back: Normal range  of motion and neck supple.     Right lower leg: Edema present.     Left lower leg: Edema present.     Comments: R SIJ tenderness palpated, with movement, better on heating pad Trace edema BLE knee pain R+L Numbness in feet, mostly left, not pain, able to move, muscle strength 5/5 LLE and RLE Weakness in BLE L>R unable to walk  Skin:    General: Skin is warm and dry.     Findings: No bruising.  Neurological:     General: No focal deficit present.     Mental Status: She is alert and oriented to person, place, and time. Mental status is at baseline.     Motor: Weakness present.     Coordination: Coordination normal.     Gait: Gait abnormal.     Comments: Weakness BLE L>R, muscle strength 5/5  Psychiatric:        Mood and Affect: Mood normal.        Behavior: Behavior normal.        Thought Content: Thought content normal.     Labs reviewed: Recent Labs    03/05/24 1817 03/06/24 0927 03/07/24 0612 03/14/24 0000 03/21/24 0000  NA 137 138 135 137 135*  K 4.0 3.8 3.8 4.5 4.1  CL 106 109 110 104 100  CO2 17* 22 21* 26* 26*  GLUCOSE 143* 137* 101*  --   --   BUN 27* 21 25* 18  17  CREATININE 1.09* 1.27* 1.08* 0.7 0.7  CALCIUM 8.7* 8.5* 8.0* 8.5* 8.6*  MG 1.9 1.8 1.8  --   --   PHOS 4.1 4.0 3.7  --   --    Recent Labs    03/05/24 1817 03/06/24 0927 03/07/24 0612 03/14/24 0000 03/28/24 0000  AST 39 30 26 37* 23  ALT 26 24 23 23 18   ALKPHOS 165* 165* 162* 219* 168*  BILITOT 0.8 0.8 0.6  --   --   PROT 6.3* 6.2* 5.7*  --   --   ALBUMIN 2.4* 2.3* 2.3* 3.2* 3.4*   Recent Labs    03/05/24 1817 03/06/24 0927 03/07/24 0612 03/14/24 0000  WBC 7.7 6.5 5.9 6.0  NEUTROABS 3.9 3.6 3.0  --   HGB 9.6* 9.2* 9.0* 9.0*  HCT 30.3* 29.1* 27.9* 28*  MCV 101.7* 99.3 100.0  --   PLT 233 253 233 241   Lab Results  Component Value Date   TSH 6.214 (H) 03/07/2024   Lab Results  Component Value Date   HGBA1C 6.5 01/06/2024   Lab Results  Component Value Date   CHOL 151 01/06/2024   HDL 48 01/06/2024   LDLCALC 84 01/06/2024   TRIG 101 01/06/2024   CHOLHDL 3.0 07/09/2021    Significant Diagnostic Results in last 30 days:  No results found.  Assessment/Plan  Black tarry stools Reported the patient has blood clot in her brief when providing incontinent care this morning.  Noted tarry stool with a distinct odor and hardened are in umbilical area upon my examination, denied nausea, vomiting, abd pain. Taking ASA 81mg , Tylenol 1000mg  tid, and Fe currently. Hx of hemorrhoids.    ED eval if HPOA agrees.   Otherwise stat CBC/diff, CMP/eGFR, KUB, hold ASA, Tylenol, Cymbalta, adding Pantoprazole 40mg  every day, VS q shift.   Chronic lower back pain The right lower back pain has been persisted, Norco is effective.   Depression  Anxiety/depression/lower back pain, stable, hold Cymbalta until tarry stools resolved.  HTN (hypertension) Blood pressure is controlled, taking Metoprolol, Bun/creat 17/0.7 03/21/24  CAD (coronary artery disease) CAD/aortic stenosis mild,  hx of CABG, dc'd Plavix, hold ASA until tarry stools resolved, continue  Atorvastatin  Anemia Iron 25, Vit B12 325 01/25/24, placed on Fe, Hgb 9.0 03/14/24, gluteal hematoma, s/p transfused  Hypothyroidism TSH 6.214 03/07/24, on Levothyroxine   Elevated serum alkaline phosphatase level Trending down from 219 to 160s   Family/ staff Communication: plan of care reviewed with the patient and charge nurse.   Labs/tests ordered:  CBC/diff, CMP/eGFR, KUB stat if prefers no ED evaluation and  treatment.

## 2024-04-13 NOTE — Assessment & Plan Note (Signed)
 Iron 25, Vit B12 325 01/25/24, placed on Fe, Hgb 9.0 03/14/24, gluteal hematoma, s/p transfused

## 2024-04-13 NOTE — ED Provider Notes (Signed)
[place] EMERGENCY DEPARTMENT AT [facility] Provider Note   CSN: [id number] Arrival date & time: 04/13/24  1148     History  Chief Complaint  Patient presents with   Rectal Bleeding    Kathryn Carlson is a 89 y.o. female with history of STEMI, type 2 diabetes, and hypothyroidism presents to the ED today for rectal bleeding.  Patient reports feeling more tired over the past 3 days and that she also had dark, tarry stools today.  She endorses some pressure in her abdomen but denies any pain.  No nausea, vomiting, or diarrhea.  She takes iron supplements and baby aspirin  daily but denies any Eliquis, warfarin, or Xarelto use. Additionally, patient reports feeling more tired over the past 3-4 days. No chest pain, shortness of breath, or fevers.    Home Medications Prior to Admission medications   Medication Sig Start Date End Date Taking? Authorizing Provider  acetaminophen  (TYLENOL ) 325 MG tablet Take 500 mg by mouth 3 (three) times daily.    [provider]  Aspirin  81 MG CAPS Take 81 mg by mouth daily.    [provider]  atorvastatin  (LIPITOR) 40 MG tablet TAKE 1 TABLET BY MOUTH EVERY DAY 09/21/23   Avanell Leigh, MD  Baclofen 5 MG TABS Take 5 mg by mouth daily.    [provider]  cephALEXin  (KEFLEX ) 500 MG capsule Take 1 capsule (500 mg total) by mouth 4 (four) times daily for 7 days. 04/13/24 04/05/2024  Sonnie Dusky, PA-C  cholecalciferol  (VITAMIN D3) 25 MCG (1000 UNIT) tablet Take 1,000 Units by mouth daily.    [provider]  diclofenac  Sodium (VOLTAREN ) 1 % GEL Apply 2 g topically 4 (four) times daily. Patient not taking: Reported on 04/13/2024 03/07/24   Sheikh, Omair Latif, DO  DULoxetine (CYMBALTA) 60 MG capsule Take 60 mg by mouth daily.    [provider]  estradiol  (ESTRACE ) 0.1 MG/GM vaginal cream Place 1 Applicatorful vaginally See admin instructions. Apply 1g vaginally in the evening on Monday, Wednesday,  Friday.    [provider]  ferrous sulfate  325 (65 FE) MG EC tablet Take 325 mg by mouth every Monday, Wednesday, and Friday.    [provider]  fluticasone (FLONASE) 50 MCG/ACT nasal spray Place 2 sprays into both nostrils daily.    [provider]  HYDROcodone -acetaminophen  (NORCO/VICODIN) 5-325 MG tablet Take 0.5 tablets by mouth every 6 (six) hours as needed for moderate pain (pain score 4-6). 03/07/24   Sheikh, Omair Latif, DO  hydrocortisone 2.5 % cream Apply 1 Application topically at bedtime.    [provider]  levothyroxine  (SYNTHROID ) 75 MCG tablet Take 75 mcg by mouth daily before breakfast.    [provider]  lidocaine 4 % Place 1 patch onto the skin daily. Apply patch at 0800 and remove patch at at 2000    [provider]  loratadine (CLARITIN) 10 MG tablet Take 10 mg by mouth daily.    [provider]  methocarbamol  (ROBAXIN ) 500 MG tablet Take 1 tablet (500 mg total) by mouth every 8 (eight) hours as needed for muscle spasms. Patient not taking: Reported on 04/13/2024 03/07/24   Aura Leeds Latif, DO  metoprolol  succinate (TOPROL -XL) 50 MG 24 hr tablet Take 1 tablet (50 mg total) by mouth daily. Take with or immediately following a meal. 03/08/24   Sheikh, Omair Latif, DO  metroNIDAZOLE 1.3 % GEL Place 1 Application vaginally at bedtime.    [provider]  Multiple Vitamin (MULTIVITAMIN WITH MINERALS) TABS tablet Take 1 tablet by mouth daily.    [provider]  mupirocin  ointment (BACTROBAN ) 2 % Apply 1 Application topically daily as needed (irritation).    [provider]  nitroGLYCERIN  (NITROSTAT ) 0.4 MG SL tablet Place 0.4 mg under the tongue every 5 (five) minutes as needed for chest pain. 08/12/23   [provider]  polyethylene glycol (MIRALAX  / GLYCOLAX ) 17 g packet Take 17 g by mouth See admin instructions. Every Mon, Wed, Friday for constipation. Mix with 4 ounces of fluid.     [provider]  potassium chloride  (KLOR-CON ) 10 MEQ tablet Take 10 mEq by mouth daily as needed. Patient not taking: Reported on 03/09/2024    [provider]      Allergies    Mobic [meloxicam] and Novocain [procaine]    Review of Systems   Review of Systems  Gastrointestinal:  Positive for blood in stool.  All other systems reviewed and are negative.   Physical Exam Updated Vital Signs BP (!) 143/53   Pulse 85   Temp 97.9 F (36.6 C) (Oral)   Resp 14   SpO2 95%  Physical Exam Vitals and nursing note reviewed. Exam conducted with a chaperone present.  Constitutional:      General: She is not in acute distress.    Appearance: Normal appearance.  HENT:     Head: Normocephalic and atraumatic.     Mouth/Throat:     Mouth: Mucous membranes are moist.  Eyes:     Conjunctiva/sclera: Conjunctivae normal.     Pupils: Pupils are equal, round, and reactive to light.  Cardiovascular:     Rate and Rhythm: Normal rate and regular rhythm.     Pulses: Normal pulses.     Heart sounds: Murmur heard.  Pulmonary:     Effort: Pulmonary effort is normal.     Breath sounds: Normal breath sounds.  Abdominal:     Palpations: Abdomen is soft.     Tenderness: There is no abdominal tenderness.  Genitourinary:    Rectum: Normal. Guaiac result negative.  Musculoskeletal:        General: Normal range of motion.     Cervical back: Normal range of motion.  Skin:    General: Skin is warm and dry.     Findings: No rash.  Neurological:     General: No focal deficit present.     Mental Status: She is alert.     Sensory: No sensory deficit.     Motor: No weakness.  Psychiatric:        Mood and Affect: Mood normal.        Behavior: Behavior normal.    ED Results / Procedures / Treatments   Labs (all labs ordered are listed, but only abnormal results are displayed) Labs Reviewed  CBC WITH DIFFERENTIAL/PLATELET - Abnormal; Notable for the following components:       Result Value   RBC 3.25 (*)    Hemoglobin 10.7 (*)    HCT 33.5 (*)    MCV 103.1 (*)    RDW 16.2 (*)    All other components within normal limits  URINALYSIS, W/ REFLEX TO CULTURE (INFECTION SUSPECTED) - Abnormal; Notable for the following components:   Color, Urine BROWN (*)    APPearance TURBID (*)    Hgb urine dipstick MODERATE (*)    Protein, ur 100 (*)    Leukocytes,Ua SMALL (*)    Bacteria, UA MANY (*)  All other components within normal limits  COMPREHENSIVE METABOLIC PANEL WITH GFR - Abnormal; Notable for the following components:   Glucose, Bld 121 (*)    BUN 28 (*)    Calcium  8.8 (*)    Albumin 2.4 (*)    AST 61 (*)    ALT 49 (*)    Alkaline Phosphatase 245 (*)    GFR, Estimated 58 (*)    All other components within normal limits  URINE CULTURE  POC OCCULT BLOOD, ED  TYPE AND SCREEN    EKG None  Radiology CT ABDOMEN PELVIS W CONTRAST Result Date: 04/13/2024 CLINICAL DATA:  Acute nonlocalized abdominal pain. Tar E stools last night with frank blood. Rigid abdomen. EXAM: CT ABDOMEN AND PELVIS WITH CONTRAST TECHNIQUE: Multidetector CT imaging of the abdomen and pelvis was performed using the standard protocol following bolus administration of intravenous contrast. RADIATION DOSE REDUCTION: This exam was performed according to the departmental dose-optimization program which includes automated exposure control, adjustment of the mA and/or kV according to patient size and/or use of iterative reconstruction technique. CONTRAST:  80mL OMNIPAQUE  IOHEXOL  300 MG/ML  SOLN COMPARISON:  CT 03/03/2024 FINDINGS: Lower chest: Ground-glass and consolidative opacities in the right lower lobe suspicious for aspiration or pneumonia. Hepatobiliary: Unchanged focus of hyperenhancement in the medial right upper lobe likely representing a flash filling hemangioma. No acute abnormality in the liver. Distended gallbladder with layering sludge. No biliary dilation. Pancreas: Fatty atrophy.  No  acute abnormality. Spleen: Unremarkable. Adrenals/Urinary Tract: Normal adrenal glands. Similar 1.2 cm hyperdense cyst in the left kidney, likely proteinaceous or hemorrhagic in nature. Bilateral cortical renal scarring. No urinary calculi or hydronephrosis. Mild diffuse bladder wall thickening. Perivesical fat stranding. Stomach/Bowel: No evidence of bowel obstruction. No bowel wall thickening. Colonic diverticulosis without diverticulitis. Moderate colonic stool burden. Stomach is within normal limits. The appendix is not visualized. Vascular/Lymphatic: Advanced aortic atherosclerotic calcification. No lymphadenopathy. Reproductive: Hysterectomy.  No adnexal mass. Other: No abscess or free intraperitoneal air. Musculoskeletal: No acute fracture. IMPRESSION: 1. Ground-glass and consolidative opacities in the right lower lobe suspicious for aspiration or pneumonia. 2. Mild diffuse bladder wall thickening with perivesical fat stranding. Correlate with urinalysis to exclude cystitis. 3. Colonic diverticulosis without diverticulitis. 4. Moderate colonic stool burden. Aortic Atherosclerosis (ICD10-I70.0). Electronically Signed   By: Rozell Cornet M.D.   On: 04/13/2024 19:24    Procedures Procedures: not indicated.   Medications Ordered in ED Medications  iohexol  (OMNIPAQUE ) 300 MG/ML solution 80 mL (80 mLs Intravenous Contrast Given 04/13/24 1658)  cephALEXin  (KEFLEX ) capsule 500 mg (500 mg Oral Given 04/13/24 1957)    ED Course/ Medical Decision Making/ A&P                                 Medical Decision Making Amount and/or Complexity of Data Reviewed Labs: ordered. Radiology: ordered.  Risk Prescription drug management.   This patient presents to the ED for concern of rectal bleeding, this involves an extensive number of treatment options, and is a complaint that carries with it a high risk of complications and morbidity.   Differential diagnosis includes: dark stools second to iron  supplements, upper GI bleed, lower GI bleed, UTI, etc.   Comorbidities  See HPI above   Additional History  Additional history obtained from prior records   Lab Tests  I ordered and personally interpreted labs.  The pertinent results include:    Negative fecal occult blood  test CBC is within normal limits for patient CMP was done after CT, can affect kidney function and alk phos - BUN of 28, alk phos of 245 otherwise within normal limits UA shows small leukocytes, many bacteria, >50 RBCs. Culture pending.   Imaging Studies  I ordered imaging studies including CT abdomen/pelvis  I independently visualized and interpreted imaging which showed:  Ground glass and consolidative opacities in right lower lobe - aspiration vs pneumonia. Mild diffuse bladder wall thickening with perivesical fat stranding. Moderate colonic stool burden. I agree with the radiologist interpretation   Problem List / ED Course / Critical Interventions / Medication Management  Patient was sent by her living facility for dark tarry stools. Patient reports feeling more tired than normal and some abdominal pressure but denies abdominal pain. No fevers, N/V/D.  Initial CMP hemolyzed, CT with contrast was done prior to repeated CMP, which may have affected kidney function.  Patient had elevated alk phos, which can be impacted by the contrast dye. AST and ALT were slightly elevated. CT showed distended gallbladder with sludge but without biliary dilation. No LUQ tenderness to palpation. Patient staffed with my attending, Dr. Nora Beal. I ordered medications including: Keflex  for UTI  Patient given paper prescription to bring back to her facility Patient's family at bedside - discussed all results with them. All questions answered. Agreeable with plan for discharge back to ALF.   Social Determinants of Health  Living facility   Test / Admission - Considered  Patient is stable and safe for discharge  home. Advised close PCP follow up. Return precautions given.       Final Clinical Impression(s) / ED Diagnoses Final diagnoses:  Acute cystitis with hematuria    Rx / DC Orders ED Discharge Orders          Ordered    cephALEXin  (KEFLEX ) 500 MG capsule  4 times daily,   Status:  Discontinued        04/13/24 1957    cephALEXin  (KEFLEX ) 500 MG capsule  4 times daily        04/13/24 1959              Sonnie Dusky, PA-C 04/13/24 2018    Horton, Sidra Dredge, DO 04/14/24 [phone]

## 2024-04-13 NOTE — Assessment & Plan Note (Addendum)
 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  Noted tarry stool with a distinct odor and hardened are in umbilical area upon my examination, denied nausea, vomiting, abd pain. Taking ASA 81mg , Tylenol 1000mg  tid, and Fe currently. Hx of hemorrhoids.    ED eval if HPOA agrees.   Otherwise stat CBC/diff, CMP/eGFR, KUB, hold ASA, Tylenol, Cymbalta, adding Pantoprazole 40mg  every day, VS q shift.

## 2024-04-13 NOTE — Assessment & Plan Note (Signed)
 The right lower back pain has been persisted, Norco is effective.

## 2024-04-13 NOTE — Assessment & Plan Note (Signed)
 Trending down from 219 to 160s

## 2024-04-13 NOTE — Assessment & Plan Note (Signed)
 Blood pressure is controlled,  taking Metoprolol, Bun/creat 17/0.7 03/21/24

## 2024-04-13 NOTE — Assessment & Plan Note (Signed)
 Anxiety/depression/lower back pain, stable, hold Cymbalta until tarry stools resolved.

## 2024-04-13 NOTE — ED Triage Notes (Signed)
 Per EMS from [facility]. Staff reports tarry stools last night. This morning tarry stools with frank blood. Rigid abdomen.  BP 130/60 HR 86 RR 18 99 RA

## 2024-04-14 DIAGNOSIS — Z743 Need for continuous supervision: Secondary | ICD-10-CM | POA: Diagnosis not present

## 2024-04-14 DIAGNOSIS — R531 Weakness: Secondary | ICD-10-CM | POA: Diagnosis not present

## 2024-04-14 DIAGNOSIS — R58 Hemorrhage, not elsewhere classified: Secondary | ICD-10-CM | POA: Diagnosis not present

## 2024-04-14 DIAGNOSIS — R319 Hematuria, unspecified: Secondary | ICD-10-CM | POA: Diagnosis not present

## 2024-04-15 DIAGNOSIS — E039 Hypothyroidism, unspecified: Secondary | ICD-10-CM | POA: Diagnosis not present

## 2024-04-15 DIAGNOSIS — I255 Ischemic cardiomyopathy: Secondary | ICD-10-CM | POA: Diagnosis not present

## 2024-04-15 DIAGNOSIS — I350 Nonrheumatic aortic (valve) stenosis: Secondary | ICD-10-CM | POA: Diagnosis not present

## 2024-04-15 DIAGNOSIS — D539 Nutritional anemia, unspecified: Secondary | ICD-10-CM | POA: Diagnosis not present

## 2024-04-15 DIAGNOSIS — E1165 Type 2 diabetes mellitus with hyperglycemia: Secondary | ICD-10-CM | POA: Diagnosis not present

## 2024-04-15 DIAGNOSIS — N390 Urinary tract infection, site not specified: Secondary | ICD-10-CM | POA: Diagnosis not present

## 2024-04-15 DIAGNOSIS — I10 Essential (primary) hypertension: Secondary | ICD-10-CM | POA: Diagnosis not present

## 2024-04-15 DIAGNOSIS — R609 Edema, unspecified: Secondary | ICD-10-CM | POA: Diagnosis not present

## 2024-04-15 DIAGNOSIS — I2510 Atherosclerotic heart disease of native coronary artery without angina pectoris: Secondary | ICD-10-CM | POA: Diagnosis not present

## 2024-04-15 DIAGNOSIS — D62 Acute posthemorrhagic anemia: Secondary | ICD-10-CM | POA: Diagnosis not present

## 2024-04-15 DIAGNOSIS — E876 Hypokalemia: Secondary | ICD-10-CM | POA: Diagnosis not present

## 2024-04-15 DIAGNOSIS — M1990 Unspecified osteoarthritis, unspecified site: Secondary | ICD-10-CM | POA: Diagnosis not present

## 2024-04-16 DIAGNOSIS — D539 Nutritional anemia, unspecified: Secondary | ICD-10-CM | POA: Diagnosis not present

## 2024-04-16 DIAGNOSIS — N390 Urinary tract infection, site not specified: Secondary | ICD-10-CM | POA: Diagnosis not present

## 2024-04-16 DIAGNOSIS — M1990 Unspecified osteoarthritis, unspecified site: Secondary | ICD-10-CM | POA: Diagnosis not present

## 2024-04-16 DIAGNOSIS — I2510 Atherosclerotic heart disease of native coronary artery without angina pectoris: Secondary | ICD-10-CM | POA: Diagnosis not present

## 2024-04-16 DIAGNOSIS — I10 Essential (primary) hypertension: Secondary | ICD-10-CM | POA: Diagnosis not present

## 2024-04-16 DIAGNOSIS — I255 Ischemic cardiomyopathy: Secondary | ICD-10-CM | POA: Diagnosis not present

## 2024-04-16 LAB — URINE CULTURE: Culture: >=100000 — AB

## 2024-04-17 ENCOUNTER — Telehealth (HOSPITAL_BASED_OUTPATIENT_CLINIC_OR_DEPARTMENT_OTHER): Payer: Self-pay | Admitting: *Deleted

## 2024-04-17 ENCOUNTER — Encounter: Payer: Self-pay | Admitting: Sports Medicine

## 2024-04-17 ENCOUNTER — Non-Acute Institutional Stay (SKILLED_NURSING_FACILITY): Payer: Self-pay | Admitting: Sports Medicine

## 2024-04-17 DIAGNOSIS — Z515 Encounter for palliative care: Secondary | ICD-10-CM

## 2024-04-17 DIAGNOSIS — N390 Urinary tract infection, site not specified: Secondary | ICD-10-CM | POA: Diagnosis not present

## 2024-04-17 DIAGNOSIS — I10 Essential (primary) hypertension: Secondary | ICD-10-CM | POA: Diagnosis not present

## 2024-04-17 DIAGNOSIS — M1990 Unspecified osteoarthritis, unspecified site: Secondary | ICD-10-CM | POA: Diagnosis not present

## 2024-04-17 DIAGNOSIS — M159 Polyosteoarthritis, unspecified: Secondary | ICD-10-CM

## 2024-04-17 DIAGNOSIS — D539 Nutritional anemia, unspecified: Secondary | ICD-10-CM | POA: Diagnosis not present

## 2024-04-17 DIAGNOSIS — I2510 Atherosclerotic heart disease of native coronary artery without angina pectoris: Secondary | ICD-10-CM | POA: Diagnosis not present

## 2024-04-17 DIAGNOSIS — R58 Hemorrhage, not elsewhere classified: Secondary | ICD-10-CM

## 2024-04-17 DIAGNOSIS — I255 Ischemic cardiomyopathy: Secondary | ICD-10-CM | POA: Diagnosis not present

## 2024-04-17 NOTE — Progress Notes (Signed)
 ED Antimicrobial Stewardship Positive Culture Follow Up   Kathryn Carlson is an 89 y.o. female who presented to [facility] on 04/13/2024 with a chief complaint of  Chief Complaint  Patient presents with   Rectal Bleeding    Recent Results (from the past 720 hours)  Urine Culture     Status: Abnormal   Collection Time: 04/13/24  2:18 PM   Specimen: Urine, Random  Result Value Ref Range Status   Specimen Description   Final    URINE, RANDOM Performed at [facility], [address], [place], [place] [address]    Special Requests   Final    NONE Reflexed from [id number] Performed at [facility], [address], [place], [place] [address]    Culture (A)  Final    >=100,000 COLONIES/mL ENTEROCOCCUS FAECIUM VANCOMYCIN RESISTANT ENTEROCOCCUS    Report Status 04/16/2024 FINAL  Final   Organism ID, Bacteria ENTEROCOCCUS FAECIUM (A)  Final      Susceptibility   Enterococcus faecium - MIC*    AMPICILLIN >=32 RESISTANT Resistant     NITROFURANTOIN 64 INTERMEDIATE Intermediate     VANCOMYCIN >=32 RESISTANT Resistant     * >=100,000 COLONIES/mL ENTEROCOCCUS FAECIUM    [x]  Treated with cephalexin , organism resistant to prescribed antimicrobial []  Patient discharged originally without antimicrobial agent and treatment is now indicated  New antibiotic prescription: Stop cephalexin . Start linezolid 600 mg PO twice a day x 5 days   ED Provider: Sueellen Emery, MD    Denson Flake, PharmD, BCPS, BCIDP Infectious Diseases Clinical Pharmacist Phone: [phone] 04/17/2024, 8:23 AM

## 2024-04-17 NOTE — Telephone Encounter (Signed)
 Post ED Visit - Positive Culture Follow-up: Successful Patient Follow-Up  Culture assessed and recommendations reviewed by:  [x]  Argie Kung.D. []  Skeet Duke, Pharm.D., BCPS AQ-ID []  Leslee Rase, Pharm.D., BCPS []  Garland Junk, Pharm.D., BCPS []  [place], [address].D., BCPS, AAHIVP []  Alcide Aly, Pharm.D., BCPS, AAHIVP []  Jerri Morale, PharmD, BCPS []  Graham Laws, PharmD, BCPS []  Cleda Curly, PharmD, BCPS []  Tamar Fairly, PharmD  Positive urine culture  []  Patient discharged without antimicrobial prescription and treatment is now indicated [x]  Organism is resistant to prescribed ED discharge antimicrobial []  Patient with positive blood cultures  Changes discussed with ED provider: Sueellen Emery New antibiotic prescription Linezoid 600 mg PO BID X 5 days Called to [facility]- spoke to [place] and faxed cx report and recommendation to [phone]     Jessee Mormon 04/17/2024, 9:41 AM

## 2024-04-17 NOTE — Progress Notes (Signed)
 Provider:  Dr. Tye Gall Location:  [name] Place of Service:   skilled care   PCP: Mast, Man X, NP Patient Care Team: Mast, Man X, NP as PCP - General (Internal Medicine) Avanell Leigh, MD as PCP - Cardiology (Cardiology) Devon Fogo, MD (Inactive) as Consulting Physician (Dermatology)  Extended Emergency Contact Information Primary Emergency Contact: [name],[name] Address: [address]          [place], [place] [address] [place] Home Phone: [phone] Mobile Phone: [phone] Relation: Daughter Secondary Emergency Contact: McCain,Debbie  [place] Home Phone: [phone] Mobile Phone: [phone] Relation: Daughter  Goals of Care: Advanced Directive information    04/13/2024   12:02 PM  Advanced Directives  Does Patient Have a Medical Advance Directive? Yes       History of Present Illness   89 yr old F with h/o CAD, DM, Hypothyroidism is evaluated for ED follow up  Pt seen and examined in her room As per staff pt is declining , she is requiring more help with ADLS  Requiring help with transferring  Poor appetite , she is consuming 25% of her meals She is having intermittent bleeding episodes with bright red blood. Family met with hospice 2 days and decided for comfort care. She is laying on her bed, C/o pain in her lower back  Denies abdominal pain, nausea, vomiting, dysuria. She knows her name, oriented to time but not place Cannot remember what she had for breakfast    Past Medical History:  Diagnosis Date   BCC (basal cell carcinoma of skin) 01/09/2009   Lower Central Back (tx p bx)   CAD (coronary artery disease)    possible ant wall MI ('97) - cath & CABG x5   Exogenous obesity    GERD (gastroesophageal reflux disease)    History of nuclear stress test 09/2012   lexiscan ; mild perfusion defect in apical anterior & apical region (infarct/scar) - no significant ischemia,  low risk    History of total bilateral knee replacement    Hypertension    Hypothyroidism    Nodular basal cell carcinoma (BCC) 08/17/2017   Left Calf (tx p bx)   PVC's (premature ventricular contractions)    SCC (squamous cell carcinoma) [place] 01/29/2004   Right Forearm (Cx3,5FU)   SCCA (squamous cell carcinoma) of skin 03/13/2005   Mid Upper Back   SCCA (squamous cell carcinoma) of skin 09/07/2005   Left Elbow(Cx3,5FU)   SCCA (squamous cell carcinoma) of skin 01/25/2006   Left Brow(in situ) (Cx3,5FU)   SCCA (squamous cell carcinoma) of skin 04/27/2006   Inner Left Shoulder(Keratoacanthoma) (Exc.)   SCCA (squamous cell carcinoma) of skin 03/22/2007   Left Temple(in situ) and Bridge of Nose(in situ) (Cs3,5FU)   SCCA (squamous cell carcinoma) of skin 05/12/2007   Top of Scalp(Cx3,5FU)   SCCA (squamous cell carcinoma) of skin 01/28/2011   Right Upper Arm(in situ)   SCCA (squamous cell carcinoma) of skin 07/26/2012   Glabella, Inferior Tip (Cx3,5FU)   SCCA (squamous cell carcinoma) of skin 03/08/2013   Left Front Scalp(in situ) and Upper Nose(in situ) (Cx3,5FU)   SCCA (squamous cell carcinoma) of skin 07/24/2013   Right Lower Leg(Keratoacanthoma)   SCCA (squamous cell carcinoma) of skin 09/11/2015   Left Scalp(in situ) (Cx3,5FU)   SCCA (squamous cell carcinoma) of skin 01/15/2016   Left Forearm(in situ) and Left Temple(in situ) (tx p bx)   SCCA (squamous cell carcinoma) of skin 10/01/2016  Left Mid Forearm(in situ)(tx p bx) and Left Front Scalp(watch)   SCCA (squamous cell carcinoma) of skin 11/12/2016   Left Temple(Keratoacanthoma) (watch)   SCCA (squamous cell carcinoma) of skin 02/11/2017   Top Left Hand(Keratoacanthoma) (tx p bx)   Squamous cell carcinoma in situ (SCCIS) 11/18/1999   Above Left Outer Eyebrow   Squamous cell carcinoma of scalp    removed - Dr. Steen Eden   Superficial basal cell carcinoma (BCC) 07/14/2004   Left Scapula (Cx3,5FU)   Past Surgical History:   Procedure Laterality Date   ABDOMINAL HYSTERECTOMY  1984   APPENDECTOMY  1941   Carotid Doppler  12/07/2012   R & L ICA - 0-49% diameter reduction   CORONARY ARTERY BYPASS GRAFT  09/1996   cath & CABG x5 LIMA-LAD, SVG-sequential OD & OM, SVG sequential to PDA & PLA (Dr. Matt Song)   REPLACEMENT TOTAL KNEE Bilateral 2001 & 2003   TONSILLECTOMY     TRANSTHORACIC ECHOCARDIOGRAM  08/23/2013   EF 50-55%, LV cavity size mod reduced, mild LVH, mild conc hypertrophy; mild AV stenosis & mild regurg; mild MR - ordered for murmur    reports that she has never smoked. She has never used smokeless tobacco. She reports current alcohol  use. No history on file for drug use. Social History   Socioeconomic History   Marital status: Widowed    Spouse name: Not on file   Number of children: 4   Years of education: 87   Highest education level: Not on file  Occupational History   Not on file  Tobacco Use   Smoking status: Never   Smokeless tobacco: Never  Vaping Use   Vaping status: Not on file  Substance and Sexual Activity   Alcohol  use: Yes    Comment: "a drink of wine every now and then"   Drug use: Not on file   Sexual activity: Not on file  Other Topics Concern   Not on file  Social History Narrative   Not on file   Social Drivers of Health   Financial Resource Strain: Not on file  Food Insecurity: No Food Insecurity (03/04/2024)   Hunger Vital Sign    Worried About Running Out of Food in the Last Year: Never true    Ran Out of Food in the Last Year: Never true  Transportation Needs: No Transportation Needs (03/04/2024)   PRAPARE - Administrator, Civil Service (Medical): No    Lack of Transportation (Non-Medical): No  Physical Activity: Not on file  Stress: Not on file  Social Connections: Socially Isolated (03/04/2024)   Social Connection and Isolation Panel [NHANES]    Frequency of Communication with Friends and Family: More than three times a week    Frequency of  Social Gatherings with Friends and Family: More than three times a week    Attends Religious Services: Never    Database administrator or Organizations: No    Attends Banker Meetings: Never    Marital Status: Widowed  Intimate Partner Violence: Not At Risk (03/04/2024)   Humiliation, Afraid, Rape, and Kick questionnaire    Fear of Current or Ex-Partner: No    Emotionally Abused: No    Physically Abused: No    Sexually Abused: No    Functional Status Survey:    Family History  Problem Relation Age of Onset   Heart attack Mother    Heart attack Father    Cancer Sister    Diabetes Sister  Heart Problems Sister     Health Maintenance  Topic Date Due   FOOT EXAM  Never done   OPHTHALMOLOGY EXAM  Never done   Zoster Vaccines- Shingrix (1 of 2) Never done   COVID-19 Vaccine (4 - 2024-25 season) 08/29/2023   DTaP/Tdap/Td (1 - Tdap) 12/28/2024 (Originally 09/28/2022)   HEMOGLOBIN A1C  07/05/2024   INFLUENZA VACCINE  07/28/2024   Medicare Annual Wellness (AWV)  01/24/2025   Pneumonia Vaccine 26+ Years old  Completed   DEXA SCAN  Completed   HPV VACCINES  Aged Out   Meningococcal B Vaccine  Aged Out    Allergies  Allergen Reactions   Mobic [Meloxicam] Other (See Comments)    Unknown reaction   Novocain [Procaine] Other (See Comments)    Unknown reaction    Outpatient Encounter Medications as of 04/17/2024  Medication Sig   acetaminophen  (TYLENOL ) 325 MG tablet Take 500 mg by mouth 3 (three) times daily.   Aspirin  81 MG CAPS Take 81 mg by mouth daily.   atorvastatin  (LIPITOR) 40 MG tablet TAKE 1 TABLET BY MOUTH EVERY DAY   Baclofen 5 MG TABS Take 5 mg by mouth daily.   cephALEXin  (KEFLEX ) 500 MG capsule Take 1 capsule (500 mg total) by mouth 4 (four) times daily for 7 days.   cholecalciferol  (VITAMIN D3) 25 MCG (1000 UNIT) tablet Take 1,000 Units by mouth daily.   diclofenac  Sodium (VOLTAREN ) 1 % GEL Apply 2 g topically 4 (four) times daily. (Patient not  taking: Reported on 04/13/2024)   DULoxetine (CYMBALTA) 60 MG capsule Take 60 mg by mouth daily.   estradiol  (ESTRACE ) 0.1 MG/GM vaginal cream Place 1 Applicatorful vaginally See admin instructions. Apply 1g vaginally in the evening on Monday, Wednesday, Friday.   ferrous sulfate  325 (65 FE) MG EC tablet Take 325 mg by mouth every Monday, Wednesday, and Friday.   fluticasone (FLONASE) 50 MCG/ACT nasal spray Place 2 sprays into both nostrils daily.   HYDROcodone -acetaminophen  (NORCO/VICODIN) 5-325 MG tablet Take 0.5 tablets by mouth every 6 (six) hours as needed for moderate pain (pain score 4-6).   hydrocortisone 2.5 % cream Apply 1 Application topically at bedtime.   levothyroxine  (SYNTHROID ) 75 MCG tablet Take 75 mcg by mouth daily before breakfast.   lidocaine 4 % Place 1 patch onto the skin daily. Apply patch at 0800 and remove patch at at 2000   loratadine (CLARITIN) 10 MG tablet Take 10 mg by mouth daily.   methocarbamol  (ROBAXIN ) 500 MG tablet Take 1 tablet (500 mg total) by mouth every 8 (eight) hours as needed for muscle spasms. (Patient not taking: Reported on 04/13/2024)   metoprolol  succinate (TOPROL -XL) 50 MG 24 hr tablet Take 1 tablet (50 mg total) by mouth daily. Take with or immediately following a meal.   metroNIDAZOLE 1.3 % GEL Place 1 Application vaginally at bedtime.   Multiple Vitamin (MULTIVITAMIN WITH MINERALS) TABS tablet Take 1 tablet by mouth daily.   mupirocin  ointment (BACTROBAN ) 2 % Apply 1 Application topically daily as needed (irritation).   nitroGLYCERIN  (NITROSTAT ) 0.4 MG SL tablet Place 0.4 mg under the tongue every 5 (five) minutes as needed for chest pain.   polyethylene glycol (MIRALAX  / GLYCOLAX ) 17 g packet Take 17 g by mouth See admin instructions. Every Mon, Wed, Friday for constipation. Mix with 4 ounces of fluid.   potassium chloride  (KLOR-CON ) 10 MEQ tablet Take 10 mEq by mouth daily as needed. (Patient not taking: Reported on 03/09/2024)   No  facility-administered  encounter medications on file as of 04/17/2024.    Review of Systems  Constitutional:  Negative for fever.  Respiratory:  Negative for cough and shortness of breath.   Cardiovascular:  Negative for chest pain and leg swelling.  Gastrointestinal:  Negative for abdominal distention, abdominal pain, nausea and vomiting.  Genitourinary:  Negative for dysuria.  Neurological:  Negative for dizziness.   Negative unless indicated in HPI.  There were no vitals filed for this visit. There is no height or weight on file to calculate BMI. BP Readings from Last 3 Encounters:  04/14/24 (!) 143/60  04/13/24 123/61  04/07/24 136/63   Wt Readings from Last 3 Encounters:  04/13/24 162 lb 12.8 oz (73.8 kg)  04/07/24 162 lb 12.8 oz (73.8 kg)  03/27/24 160 lb (72.6 kg)   Physical Exam Constitutional:      Appearance: Normal appearance.  HENT:     Head: Normocephalic.  Cardiovascular:     Rate and Rhythm: Normal rate and regular rhythm.     Heart sounds: Murmur heard.  Pulmonary:     Effort: Pulmonary effort is normal. No respiratory distress.     Breath sounds: Normal breath sounds. No wheezing.  Abdominal:     General: Bowel sounds are normal. There is no distension.     Tenderness: There is no abdominal tenderness. There is no guarding.     Comments:    Skin:    Comments: Bruises on her lower extremities  Neurological:     Mental Status: She is alert. Mental status is at baseline.     Comments: Pt able to elevate her legs off the bed but not against resistance      Labs reviewed: Basic Metabolic Panel: Recent Labs    03/05/24 1817 03/06/24 0927 03/07/24 0612 03/14/24 0000 03/21/24 0000 04/13/24 1620  NA 137 138 135 137 135* 137  K 4.0 3.8 3.8 4.5 4.1 3.7  CL 106 109 110 104 100 106  CO2 17* 22 21* 26* 26* 24  GLUCOSE 143* 137* 101*  --   --  121*  BUN 27* 21 25* 18 17 28*  CREATININE 1.09* 1.27* 1.08* 0.7 0.7 0.92  CALCIUM  8.7* 8.5* 8.0* 8.5* 8.6*  8.8*  MG 1.9 1.8 1.8  --   --   --   PHOS 4.1 4.0 3.7  --   --   --    Liver Function Tests: Recent Labs    03/06/24 0927 03/07/24 0612 03/14/24 0000 03/28/24 0000 04/13/24 1620  AST 30 26 37* 23 61*  ALT 24 23 23 18  49*  ALKPHOS 165* 162* 219* 168* 245*  BILITOT 0.8 0.6  --   --  0.7  PROT 6.2* 5.7*  --   --  6.5  ALBUMIN 2.3* 2.3* 3.2* 3.4* 2.4*   No results for input(s): "LIPASE", "AMYLASE" in the last 8760 hours. No results for input(s): "AMMONIA" in the last 8760 hours. CBC: Recent Labs    03/06/24 0927 03/07/24 0612 03/14/24 0000 04/13/24 1418  WBC 6.5 5.9 6.0 8.8  NEUTROABS 3.6 3.0  --  5.3  HGB 9.2* 9.0* 9.0* 10.7*  HCT 29.1* 27.9* 28* 33.5*  MCV 99.3 100.0  --  103.1*  PLT 253 233 241 230   Cardiac Enzymes: No results for input(s): "CKTOTAL", "CKMB", "CKMBINDEX", "TROPONINI" in the last 8760 hours. BNP: Invalid input(s): "POCBNP" Lab Results  Component Value Date   HGBA1C 6.5 01/06/2024   Lab Results  Component Value Date   TSH  6.214 (H) 03/07/2024   Lab Results  Component Value Date   VITAMINB12 320 09/13/2013   No results found for: "FOLATE" Lab Results  Component Value Date   IRON 30 03/04/2024   TIBC 230 (L) 03/04/2024   FERRITIN 102 03/04/2024    Imaging and Procedures obtained prior to SNF admission: CT ABDOMEN PELVIS W CONTRAST Result Date: 04/13/2024 CLINICAL DATA:  Acute nonlocalized abdominal pain. Tar E stools last night with frank blood. Rigid abdomen. EXAM: CT ABDOMEN AND PELVIS WITH CONTRAST TECHNIQUE: Multidetector CT imaging of the abdomen and pelvis was performed using the standard protocol following bolus administration of intravenous contrast. RADIATION DOSE REDUCTION: This exam was performed according to the departmental dose-optimization program which includes automated exposure control, adjustment of the mA and/or kV according to patient size and/or use of iterative reconstruction technique. CONTRAST:  80mL OMNIPAQUE  IOHEXOL   300 MG/ML  SOLN COMPARISON:  CT 03/03/2024 FINDINGS: Lower chest: Ground-glass and consolidative opacities in the right lower lobe suspicious for aspiration or pneumonia. Hepatobiliary: Unchanged focus of hyperenhancement in the medial right upper lobe likely representing a flash filling hemangioma. No acute abnormality in the liver. Distended gallbladder with layering sludge. No biliary dilation. Pancreas: Fatty atrophy.  No acute abnormality. Spleen: Unremarkable. Adrenals/Urinary Tract: Normal adrenal glands. Similar 1.2 cm hyperdense cyst in the left kidney, likely proteinaceous or hemorrhagic in nature. Bilateral cortical renal scarring. No urinary calculi or hydronephrosis. Mild diffuse bladder wall thickening. Perivesical fat stranding. Stomach/Bowel: No evidence of bowel obstruction. No bowel wall thickening. Colonic diverticulosis without diverticulitis. Moderate colonic stool burden. Stomach is within normal limits. The appendix is not visualized. Vascular/Lymphatic: Advanced aortic atherosclerotic calcification. No lymphadenopathy. Reproductive: Hysterectomy.  No adnexal mass. Other: No abscess or free intraperitoneal air. Musculoskeletal: No acute fracture. IMPRESSION: 1. Ground-glass and consolidative opacities in the right lower lobe suspicious for aspiration or pneumonia. 2. Mild diffuse bladder wall thickening with perivesical fat stranding. Correlate with urinalysis to exclude cystitis. 3. Colonic diverticulosis without diverticulitis. 4. Moderate colonic stool burden. Aortic Atherosclerosis (ICD10-I70.0). Electronically Signed   By: Rozell Cornet M.D.   On: 04/13/2024 19:24    Assessment and Plan Assessment & Plan  1. Acute UTI (urinary tract infection) (Primary) Pt denies dysuria Urine culture from ED grew Vancomycin resistant enterococcus Will d/c keflex  Will start fosfomycin  2. Bleeding Staff reported intermittent episodes Family decided to go with hospice  Will stop  aspirin    3. Hospice care Pt c/o pain in her lower back  Denies abdominal pain, nausea, vomiting Will cont with hydrocodone  q6 prn   4. Generalized OA Cont with lidocaine patch Cont with hydrocodone     30 min Total time spent for obtaining history,  performing a medically appropriate examination and evaluation, reviewing the tests,   documenting clinical information in the electronic or other health record,care coordination (not separately reported)

## 2024-04-19 ENCOUNTER — Other Ambulatory Visit: Payer: Self-pay | Admitting: Adult Health

## 2024-04-19 DIAGNOSIS — Z515 Encounter for palliative care: Secondary | ICD-10-CM

## 2024-04-19 DIAGNOSIS — I255 Ischemic cardiomyopathy: Secondary | ICD-10-CM | POA: Diagnosis not present

## 2024-04-19 DIAGNOSIS — M1990 Unspecified osteoarthritis, unspecified site: Secondary | ICD-10-CM | POA: Diagnosis not present

## 2024-04-19 DIAGNOSIS — N390 Urinary tract infection, site not specified: Secondary | ICD-10-CM | POA: Diagnosis not present

## 2024-04-19 DIAGNOSIS — I2510 Atherosclerotic heart disease of native coronary artery without angina pectoris: Secondary | ICD-10-CM | POA: Diagnosis not present

## 2024-04-19 DIAGNOSIS — I10 Essential (primary) hypertension: Secondary | ICD-10-CM | POA: Diagnosis not present

## 2024-04-19 DIAGNOSIS — D539 Nutritional anemia, unspecified: Secondary | ICD-10-CM | POA: Diagnosis not present

## 2024-04-19 MED ORDER — MORPHINE SULFATE (CONCENTRATE) 20 MG/ML PO SOLN: 5.0000 mg | ORAL | 0 refills | Status: DC | PRN

## 2024-04-19 MED ORDER — MORPHINE SULFATE (CONCENTRATE) 20 MG/ML PO SOLN: 5.0000 mg | Freq: Two times a day (BID) | ORAL | 0 refills | Status: DC

## 2024-04-20 DIAGNOSIS — I255 Ischemic cardiomyopathy: Secondary | ICD-10-CM | POA: Diagnosis not present

## 2024-04-20 DIAGNOSIS — D539 Nutritional anemia, unspecified: Secondary | ICD-10-CM | POA: Diagnosis not present

## 2024-04-20 DIAGNOSIS — M1990 Unspecified osteoarthritis, unspecified site: Secondary | ICD-10-CM | POA: Diagnosis not present

## 2024-04-20 DIAGNOSIS — N390 Urinary tract infection, site not specified: Secondary | ICD-10-CM | POA: Diagnosis not present

## 2024-04-20 DIAGNOSIS — I2510 Atherosclerotic heart disease of native coronary artery without angina pectoris: Secondary | ICD-10-CM | POA: Diagnosis not present

## 2024-04-20 DIAGNOSIS — I10 Essential (primary) hypertension: Secondary | ICD-10-CM | POA: Diagnosis not present

## 2024-04-27 DEATH — deceased

## 2024-06-27 ENCOUNTER — Ambulatory Visit: Admitting: General Practice
